# Patient Record
Sex: Male | Born: 1935 | Race: White | Hispanic: No | Marital: Married | Smoking: Never smoker
Health system: Southern US, Community
[De-identification: ages and names within clinical notes are randomized; demographics above are authoritative.]

## PROBLEM LIST (undated history)

## (undated) DIAGNOSIS — Z9289 Personal history of other medical treatment: Secondary | ICD-10-CM

## (undated) DIAGNOSIS — I10 Essential (primary) hypertension: Secondary | ICD-10-CM

## (undated) DIAGNOSIS — C801 Malignant (primary) neoplasm, unspecified: Secondary | ICD-10-CM

## (undated) DIAGNOSIS — E785 Hyperlipidemia, unspecified: Secondary | ICD-10-CM

## (undated) DIAGNOSIS — J439 Emphysema, unspecified: Secondary | ICD-10-CM

## (undated) DIAGNOSIS — N400 Enlarged prostate without lower urinary tract symptoms: Secondary | ICD-10-CM

## (undated) DIAGNOSIS — K760 Fatty (change of) liver, not elsewhere classified: Secondary | ICD-10-CM

## (undated) DIAGNOSIS — I6529 Occlusion and stenosis of unspecified carotid artery: Secondary | ICD-10-CM

## (undated) DIAGNOSIS — U071 COVID-19: Secondary | ICD-10-CM

## (undated) DIAGNOSIS — I739 Peripheral vascular disease, unspecified: Secondary | ICD-10-CM

## (undated) DIAGNOSIS — H9193 Unspecified hearing loss, bilateral: Secondary | ICD-10-CM

## (undated) DIAGNOSIS — K5900 Constipation, unspecified: Secondary | ICD-10-CM

## (undated) DIAGNOSIS — E041 Nontoxic single thyroid nodule: Secondary | ICD-10-CM

## (undated) DIAGNOSIS — I82409 Acute embolism and thrombosis of unspecified deep veins of unspecified lower extremity: Secondary | ICD-10-CM

## (undated) DIAGNOSIS — I2510 Atherosclerotic heart disease of native coronary artery without angina pectoris: Secondary | ICD-10-CM

## (undated) DIAGNOSIS — I700 Atherosclerosis of aorta: Secondary | ICD-10-CM

## (undated) DIAGNOSIS — G4700 Insomnia, unspecified: Secondary | ICD-10-CM

## (undated) DIAGNOSIS — K219 Gastro-esophageal reflux disease without esophagitis: Secondary | ICD-10-CM

## (undated) HISTORY — DX: Atherosclerotic heart disease of native coronary artery without angina pectoris: I25.10

## (undated) HISTORY — DX: Fatty (change of) liver, not elsewhere classified: K76.0

## (undated) HISTORY — DX: Essential (primary) hypertension: I10

## (undated) HISTORY — DX: Nontoxic single thyroid nodule: E04.1

## (undated) HISTORY — DX: Atherosclerosis of aorta: I70.0

## (undated) HISTORY — DX: Unspecified hearing loss, bilateral: H91.93

## (undated) HISTORY — DX: Insomnia, unspecified: G47.00

## (undated) HISTORY — DX: Peripheral vascular disease, unspecified: I73.9

## (undated) HISTORY — PX: APPENDECTOMY: SHX54

## (undated) HISTORY — PX: LIPOMA EXCISION: SHX5283

## (undated) HISTORY — DX: Personal history of other medical treatment: Z92.89

## (undated) HISTORY — DX: Constipation, unspecified: K59.00

## (undated) HISTORY — DX: Malignant (primary) neoplasm, unspecified: C80.1

## (undated) HISTORY — DX: Occlusion and stenosis of unspecified carotid artery: I65.29

## (undated) HISTORY — DX: Gastro-esophageal reflux disease without esophagitis: K21.9

## (undated) HISTORY — DX: Emphysema, unspecified: J43.9

## (undated) HISTORY — DX: Benign prostatic hyperplasia without lower urinary tract symptoms: N40.0

## (undated) HISTORY — DX: Hyperlipidemia, unspecified: E78.5

## (undated) HISTORY — PX: HERNIA REPAIR: SHX51

---

## 1998-05-25 ENCOUNTER — Encounter: Payer: Self-pay | Admitting: *Deleted

## 1998-05-25 ENCOUNTER — Ambulatory Visit (HOSPITAL_COMMUNITY): Admission: RE | Admit: 1998-05-25 | Discharge: 1998-05-25 | Payer: Self-pay | Admitting: *Deleted

## 1999-01-16 ENCOUNTER — Encounter: Payer: Self-pay | Admitting: *Deleted

## 1999-01-16 ENCOUNTER — Inpatient Hospital Stay (HOSPITAL_COMMUNITY): Admission: EM | Admit: 1999-01-16 | Discharge: 1999-01-16 | Payer: Self-pay | Admitting: *Deleted

## 2001-05-15 ENCOUNTER — Ambulatory Visit (HOSPITAL_COMMUNITY): Admission: RE | Admit: 2001-05-15 | Discharge: 2001-05-15 | Payer: Self-pay | Admitting: Gastroenterology

## 2003-03-23 ENCOUNTER — Ambulatory Visit (HOSPITAL_COMMUNITY): Admission: RE | Admit: 2003-03-23 | Discharge: 2003-03-23 | Payer: Self-pay | Admitting: Family Medicine

## 2005-08-02 ENCOUNTER — Inpatient Hospital Stay (HOSPITAL_COMMUNITY): Admission: AD | Admit: 2005-08-02 | Discharge: 2005-08-04 | Payer: Self-pay | Admitting: Internal Medicine

## 2007-01-29 ENCOUNTER — Ambulatory Visit: Payer: Self-pay | Admitting: Vascular Surgery

## 2007-09-09 ENCOUNTER — Ambulatory Visit: Payer: Self-pay | Admitting: Vascular Surgery

## 2008-10-20 ENCOUNTER — Ambulatory Visit: Payer: Self-pay | Admitting: Vascular Surgery

## 2009-01-26 ENCOUNTER — Ambulatory Visit: Payer: Self-pay | Admitting: Vascular Surgery

## 2009-02-15 ENCOUNTER — Emergency Department (HOSPITAL_BASED_OUTPATIENT_CLINIC_OR_DEPARTMENT_OTHER): Admission: EM | Admit: 2009-02-15 | Discharge: 2009-02-15 | Payer: Self-pay | Admitting: Emergency Medicine

## 2009-03-05 ENCOUNTER — Inpatient Hospital Stay (HOSPITAL_COMMUNITY): Admission: RE | Admit: 2009-03-05 | Discharge: 2009-03-06 | Payer: Self-pay | Admitting: Vascular Surgery

## 2009-03-05 ENCOUNTER — Ambulatory Visit: Payer: Self-pay | Admitting: Vascular Surgery

## 2009-03-05 ENCOUNTER — Encounter: Payer: Self-pay | Admitting: Vascular Surgery

## 2009-03-05 HISTORY — PX: CAROTID ENDARTERECTOMY: SUR193

## 2009-03-09 ENCOUNTER — Ambulatory Visit: Payer: Self-pay | Admitting: Vascular Surgery

## 2009-03-23 ENCOUNTER — Ambulatory Visit: Payer: Self-pay | Admitting: Vascular Surgery

## 2009-04-13 ENCOUNTER — Ambulatory Visit: Payer: Self-pay | Admitting: Vascular Surgery

## 2009-08-24 ENCOUNTER — Ambulatory Visit: Payer: Self-pay | Admitting: Vascular Surgery

## 2010-03-08 ENCOUNTER — Ambulatory Visit: Admit: 2010-03-08 | Payer: Self-pay | Admitting: Vascular Surgery

## 2010-03-08 ENCOUNTER — Ambulatory Visit
Admission: RE | Admit: 2010-03-08 | Discharge: 2010-03-08 | Payer: Self-pay | Source: Home / Self Care | Attending: Vascular Surgery | Admitting: Vascular Surgery

## 2010-03-09 NOTE — Assessment & Plan Note (Signed)
OFFICE VISIT  HAMILTON, MARINELLO E DOB:  Aug 15, 1929                                       03/08/2010 [id number]#:[id number]  Mr. Inge returns today for continued follow-up regarding his carotid occlusive disease.  He underwent a right carotid endarterectomy by me in January 2011 for an asymptomatic severe carotid stenosis.  He has done very well since that time with no neurologic symptoms other than some of balance problems and dizziness which he was experiencing after last visit.  He was evaluated by ENT and found to have an inner ear problem which has now resolved and he is asymptomatic.  He does have some limping occasionally on his right leg due to some discomfort in the lateral aspect the right knee.  He denies any chest pain, dyspnea on exertion, PND or orthopnea.  SOCIAL HISTORY:  He is married and has 1 child, is retired, does not use tobacco or alcohol.  PHYSICAL EXAMINATION:  Blood pressure is 172/96, heart rate 72, respirations 14. GENERAL:  He is a well-developed, well-nourished male in no apparent distress, alert and oriented x3. HEENT:  Exam is normal for age.  EOMs intact. LUNGS:  Clear to auscultation.  No rhonchi or wheezing. CARDIOVASCULAR:  Regular rhythm.  No murmurs.  Carotid pulses 3+.  No audible bruits. ABDOMEN:  Soft, nontender with no masses. NEUROLOGIC:  Exam is normal.  Today I ordered a carotid duplex exam which I have reviewed and interpreted.  Both internal carotid arteries are widely patent with no evidence of restenosis on the right and only mild intimal thickening on the left.  I have reassured him regarding these findings and will see him back in 1 year for a follow-up carotid duplex exam unless he develops any neurologic symptoms in the interim.    [name] Hart Rochester, M.D. Electronically Signed  JDL/MEDQ  D:  03/08/2010  T:  03/09/2010  Job:  [id number]

## 2010-03-14 NOTE — Procedures (Unsigned)
CAROTID DUPLEX EXAM  INDICATION:  Followup known carotid artery disease.  HISTORY: Diabetes:  No. Cardiac:  No. Hypertension:  No. Smoking:  No. Previous Surgery:  Right carotid endarterectomy 03/05/2009. CV History:  The patient is currently asymptomatic. Amaurosis Fugax No, Paresthesias No, Hemiparesis No                                      RIGHT             LEFT Brachial systolic pressure:         122               126 Brachial Doppler waveforms:         WNL               WNL Vertebral direction of flow:        Antegrade         Antegrade DUPLEX VELOCITIES (cm/sec) CCA peak systolic                   67                79 ECA peak systolic                   80                87 ICA peak systolic                   84                75 ICA end diastolic                   29                15 PLAQUE MORPHOLOGY:                  Heterogeneous     Heterogeneous PLAQUE AMOUNT:                      Minimal           Mild to moderate PLAQUE LOCATION:                    ECA               ICA, ECA, CCA  IMPRESSION:  Bilaterally no hemodynamically significant stenosis. Patent right carotid endarterectomy.  Intimal thickening within the left common carotid artery.  Study stable compared to previous.         ___________________________________________ [name] Hart [place], M.D.  OD/MEDQ  D:  03/08/2010  T:  03/08/2010  Job:  [id number]

## 2010-04-24 LAB — BASIC METABOLIC PANEL
BUN: 12 mg/dL (ref 6–23)
CO2: 28 mEq/L (ref 19–32)
CO2: 25 mEq/L (ref 19–32)
Calcium: 9.7 mg/dL (ref 8.4–10.5)
Chloride: 103 mEq/L (ref 96–112)
GFR calc non Af Amer: 59 mL/min — ABNORMAL LOW (ref >60)
GFR calc non Af Amer: >60 mL/min (ref >60)
Glucose, Bld: 129 mg/dL — ABNORMAL HIGH (ref 70–99)
Potassium: 4.3 mEq/L (ref 3.5–5.1)
Sodium: 135 mEq/L (ref 135–145)

## 2010-04-24 LAB — COMPREHENSIVE METABOLIC PANEL
Albumin: 4 g/dL (ref 3.5–5.2)
Alkaline Phosphatase: 63 U/L (ref 39–117)
GFR calc Af Amer: >60 mL/min (ref >60)
GFR calc non Af Amer: >60 mL/min (ref >60)
Glucose, Bld: 108 mg/dL — ABNORMAL HIGH (ref 70–99)
Potassium: 4.1 mEq/L (ref 3.5–5.1)
Sodium: 142 mEq/L (ref 135–145)
Total Protein: 6.3 g/dL (ref 6.0–8.3)

## 2010-04-24 LAB — CROSSMATCH
ABO/RH(D): O POS
Antibody Screen: NEGATIVE

## 2010-04-24 LAB — CBC
HCT: 50.8 % (ref 39.0–52.0)
HCT: 40.3 % (ref 39.0–52.0)
Hemoglobin: 17.5 g/dL — ABNORMAL HIGH (ref 13.0–17.0)
Hemoglobin: 15.3 g/dL (ref 13.0–17.0)
MCV: 98.8 fL (ref 78.0–100.0)
MCV: 98.9 fL (ref 78.0–100.0)
Platelets: 145 10*3/uL — ABNORMAL LOW (ref 150–400)
RBC: 4.48 MIL/uL (ref 4.22–5.81)
RDW: 12.1 % (ref 11.5–15.5)
RDW: 13 % (ref 11.5–15.5)
WBC: 6.1 10*3/uL (ref 4.0–10.5)
WBC: 9.4 10*3/uL (ref 4.0–10.5)

## 2010-04-24 LAB — ABO/RH: ABO/RH(D): O POS

## 2010-04-24 LAB — DIFFERENTIAL
Lymphs Abs: 0.3 10*3/uL — ABNORMAL LOW (ref 0.7–4.0)
Monocytes Relative: 3 % (ref 3–12)
Neutrophils Relative %: 93 % — ABNORMAL HIGH (ref 43–77)

## 2010-04-24 LAB — URINALYSIS, ROUTINE W REFLEX MICROSCOPIC
Glucose, UA: NEGATIVE mg/dL
Ketones, ur: NEGATIVE mg/dL
Nitrite: NEGATIVE
Protein, ur: NEGATIVE mg/dL
Urobilinogen, UA: 1 mg/dL (ref 0.0–1.0)

## 2010-04-24 LAB — PROTIME-INR: INR: 1 (ref 0.00–1.49)

## 2010-06-21 NOTE — Assessment & Plan Note (Signed)
OFFICE VISIT   William Mann, William Mann  DOB:  11/04/1929                                       03/09/2009  JJOAC#:[id number]   The patient underwent a right carotid endarterectomy for severe right  internal carotid stenosis which was asymptomatic on January 28.  He was  discharged the following day with some mild swelling in the right neck  but no specific complaints.  Since then he was concerned about swelling  in the midportion of his neck and a firm area at the base of the neck as  well as some swelling down over the upper chest area.  He has had no  hemiparesis, aphasia, amaurosis fugax, diplopia, blurred vision, syncope  and is swallowing well.  He is taking 1 aspirin per day.  He also has  had no significant fever with 1 temperature around 100 degrees.   PHYSICAL EXAMINATION:  Today, blood pressure 140/94, heart rate 88,  temperature 98.7.  Right neck incision is healing nicely.  There is some  mild to moderate swelling which is unchanged from the morning of  discharge.  There is no pulsatile mass.  Carotid pulses are 3+ and no  bruits audible.  Neurologic:  Exam is normal except for mild right  marginal mandibular nerve paresis which he had postoperatively.  There  is some mild ecchymosis in the upper chest near the sternal notch area  where the blood has diffused into that area but no significant swelling  or hematoma.   I have reassured him regarding all of these findings.  He will increase  his activity and return to driving as he feels up to it  over the next  week or so and will return to see me in 6 months for follow-up carotid  duplex exam unless he has any concerns in the interim.     [name] Hart [place], M.D.  Electronically Signed   JDL/MEDQ  D:  03/09/2009  T:  03/10/2009  Job:  [id number]   cc:   Lyn Records, M.D.

## 2010-06-21 NOTE — Procedures (Signed)
CAROTID DUPLEX EXAM   INDICATION:  Followup of known carotid artery disease.  Patient is  asymptomatic.   HISTORY:  Diabetes:  No.  Cardiac:  No.  Hypertension:  No.  Smoking:  No.  Previous Surgery:  No.  CV History:  Amaurosis Fugax No, Paresthesias No, Hemiparesis No  Left eye loss of vision in 2002, thought to be a retinal vein  thrombosis, per Dr. Lovell Sheehan.                                       RIGHT             LEFT  Brachial systolic pressure:         115               118  Brachial Doppler waveforms:         WNL               WNL  Vertebral direction of flow:        Antegrade         Antegrade  DUPLEX VELOCITIES (cm/sec)  CCA peak systolic                   237 (distal)      102  ECA peak systolic                   318               101  ICA peak systolic                   229               123  ICA end diastolic                   40                22  PLAQUE MORPHOLOGY:                  Mixed             Mixed  PLAQUE AMOUNT:                      Moderate-severe   Moderate  PLAQUE LOCATION:                    Bifurcation, ICA, ECA               Bifurcation, ICA   IMPRESSION:  1. Right internal carotid artery is tortuous.  2. Right distal common carotid artery with known  50% stenosis      extending into PICA causing a 60-79% stenosis (at low end of      range).  3. Left 40-59% internal carotid artery stenosis.  4. Left external carotid artery stenosis.    ___________________________________________  [name] Hart [place], M.D.   PB/MEDQ  D:  01/29/2007  T:  01/30/2007  Job:  [id number]

## 2010-06-21 NOTE — Procedures (Signed)
CAROTID DUPLEX EXAM   INDICATION:  Followup carotid artery disease.   HISTORY:  Diabetes:  No.  Cardiac:  No.  Hypertension:  No.  Smoking:  No.  Previous Surgery:  No.  CV History:  Partial vision loss in left eye since 2002.  Amaurosis Fugax No, Paresthesias No, Hemiparesis No                                       RIGHT               LEFT  Brachial systolic pressure:         116                 120  Brachial Doppler waveforms:         WNL                 WNL  Vertebral direction of flow:        Antegrade           Antegrade  DUPLEX VELOCITIES (cm/sec)  CCA peak systolic                   M=82, D=196         101  ECA peak systolic                   303                 135  ICA peak systolic                   314                 102  ICA end diastolic                   84                  30  PLAQUE MORPHOLOGY:                  Mixed               Mixed  PLAQUE AMOUNT:                      Moderate/severe     Mild  PLAQUE LOCATION:                    ICA/ECA/bifurcation  ICA/ECA/bifurcation   IMPRESSION:  1. Right internal carotid artery shows evidence of 60-79% stenosis, an      increase in velocity from previous, however, no category change.  2. Right distal common carotid artery stenosis extending into the      proximal internal carotid artery and external carotid artery.  3. Right external carotid artery stenosis.  4. Left internal carotid artery shows evidence of 20-39% stenosis.  5. Bilateral tortuous internal carotid arteries.   ___________________________________________  [name] Hart [place], M.D.   AS/MEDQ  D:  10/20/2008  T:  10/21/2008  Job:  [id number]

## 2010-06-21 NOTE — Assessment & Plan Note (Signed)
OFFICE VISIT   LUCAH, PETTA E  DOB:  12-May-1929                                       03/23/2009  ZOXWR#:[id number]   Patient returns to clinic today for wound check.  He underwent right  carotid endarterectomy with Dacron patch angioplasty on 03/05/2009.  He  was seen by Dr. Hart Rochester on 03/09/2009.  He returns today for further  evaluation of his neck.  He states that he has had no hemiparesis,  aphasia, amaurosis fugax.  He is swallowing and breathing.  He returns  today for follow-up.   Physical findings revealed a well-nourished gentleman in no apparent  distress.  Heart rate was 86, blood pressure 154/93, O2 sat was 96%.  HEENT:  PERRLA.  EOMI.  Lungs were clear to auscultation.  Cardiac exam  revealed a regular rate and rhythm.  His right neck incision is healing  well.  There continues to remain some minimal swelling, but the wound is  otherwise in very good condition.   Patient's wife is with him today.  Both were reassured that he is  healing normally, and he can begin participation in other activities, as  his condition warrants.  He was instructed it would be okay for him to  exercise, to play golf, to drive.  He was given a return appointment in  6 months for follow-up.   Wilmon Arms, PA   [name] Hart Rochester, M.D.  Electronically Signed   KEL/MEDQ  D:  03/23/2009  T:  03/23/2009  Job:  [id number]

## 2010-06-21 NOTE — Assessment & Plan Note (Signed)
OFFICE VISIT   William Mann, William Mann  DOB:  January 08, 1930                                       04/13/2009  ZDGLO#:[id number]   Patient had a right carotid endarterectomy performed by me on January 28  for severe asymptomatic right internal carotid stenosis.  He has done  very well but was concerned today about some drainage from the  incision and some low grade fever.  He states that he has lost a few  pounds since the surgery, but he is swallowing well, has no hoarseness,  and denies any neurologic symptoms.   On physical exam today, blood pressure is 134/84, heart rate is 80,  respirations are 14, temperature 98.5.  Right neck incision has healed  nicely with no evidence of infection.  The Vicryl knot in the  subcuticular closure was exposed in the very apex of the wound, and it  was removed today.  I think this is where the drainage was arising.  There is no evidence of any wound infection or deep infection.  Neurologic exam is normal except for a mild right marginal and  mandibular nerve weakness.  Was reassured regarding this.   Return in 5 months as scheduled for a carotid duplex exam.     [name], M.D.  Electronically Signed   JDL/MEDQ  D:  04/13/2009  T:  04/13/2009  Job:  [id number]

## 2010-06-21 NOTE — Assessment & Plan Note (Signed)
OFFICE VISIT   KALLAN, MERRICK E  DOB:  06-30-1929                                       08/24/2009  ZOXWR#:[id number]   The patient is status post right carotid endarterectomy done by me in  January of this year for severe right internal carotid stenosis which  was asymptomatic.  He has done very well postoperatively but lately has  developed some occasional dizziness particularly when he is lying in the  bed with the left side of his head down.  This lasts only a few seconds  and has not been associated with syncope, hemiparesis, aphasia or  amaurosis fugax.  He has no history of stroke.  He has had some  occasional problems with balance when he walks which was present to some  degree preoperatively and possibly worsened slightly.  He has not  fallen.  He is taking one aspirin per day.  He denies claudication  symptoms.   SOCIAL HISTORY:  He is married, has one child, is retired.  Does not use  tobacco or alcohol.   Review of systems is negative for chest pain, dyspnea on exertion,  hemoptysis, bronchitis, wheezing.  No GI or GU problems.  All other  systems are negative.   PHYSICAL EXAM:  Vital signs:  Blood pressure 149/87, heart rate 91,  respirations 14.  General:  He is a well-developed, well-nourished male  in no apparent distress.  HEENT:  Exam is normal for age.  EOMs intact.  Lungs:  Clear to auscultation.  No rhonchi or wheezing.  Cardiovascular:  Regular rhythm.  Carotid pulses are 3+ with no bruits.  Abdomen:  Soft,  nontender with no masses.  He has 3+ femoral and posterior tibial pulses  bilaterally.  Neurological:  Normal.   Today I ordered a carotid duplex exam which I reviewed and interpreted.  Both internal carotids are widely patent with no evidence of significant  stenosis.   I reassured him regarding these findings.  We will see him in 6 months  with a carotid duplex exam and if that looks good we will follow him on  an annual  basis.     [name] Hart Rochester, M.D.  Electronically Signed   JDL/MEDQ  D:  08/24/2009  T:  08/25/2009  Job:  [id number]

## 2010-06-21 NOTE — Procedures (Signed)
CAROTID DUPLEX EXAM   INDICATION:  Follow up known carotid disease.   HISTORY:  Diabetes:  No.  Cardiac:  No.  Hypertension:  No.  Smoking:  No.  Previous Surgery:  Right carotid endarterectomy on 03/05/09.  CV History:  Partial vision loss in left eye since 2002, recent  lightheadedness and dizziness and off balance when walking.  Amaurosis Fugax No, Paresthesias No, Hemiparesis No.                                       RIGHT             LEFT  Brachial systolic pressure:         110               118  Brachial Doppler waveforms:         Normal            Normal  Vertebral direction of flow:        Antegrade         Antegrade  DUPLEX VELOCITIES (cm/sec)  CCA peak systolic                   72                92  ECA peak systolic                   107               104  ICA peak systolic                   71                87  ICA end diastolic                   23                25  PLAQUE MORPHOLOGY:                                    Calcific  PLAQUE AMOUNT:                      None              Mild  PLAQUE LOCATION:                                      ICA, ECA   IMPRESSION:  1. Patent right carotid, post carotid endarterectomy with no evidence      of restenosis.  2. Left internal carotid artery Doppler velocities suggest 1% to 39%      stenosis.  3. Antegrade flow in bilateral vertebral arteries.   ___________________________________________  [name] Hart Rochester, M.D.   NT/MEDQ  D:  08/24/2009  T:  08/24/2009  Job:  [id number]

## 2010-06-21 NOTE — H&P (Signed)
HISTORY AND PHYSICAL EXAMINATION   January 26, 2009   Re:  CHRISTIE, COPLEY E              DOB:  02/20/1929   CHIEF COMPLAINT:  Severe right internal carotid stenosis - asymptomatic.   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  He has had progressive stenosis of  his right internal carotid artery which is now approximating 80%.  He  has had a second opinion at [facility] by Dr. Noel Christmas and he is  now returning for an elective right carotid endarterectomy for this  asymptomatic severe lesion.   PAST MEDICAL HISTORY:  Is negative for diabetes, coronary artery  disease, COPD or stroke.  He does have hypertension.   PAST SURGICAL HISTORY:  1. Appendectomy.  2. Hernia repair.  3. Removal of lipoma from the leg.   FAMILY HISTORY:  Positive for coronary artery disease in sister and two  brothers, stroke in a brother.  Negative for diabetes.   SOCIAL HISTORY:  The patient does not use tobacco or alcohol.  He is a  Civil engineer, contracting 2 days a week.   REVIEW OF SYSTEMS:  Negative for chest pain, dyspnea on exertion, PND,  orthopnea.  No pulmonary symptoms.  Denies any weight loss, anorexia.  No urinary or vascular symptoms.  No claudication.  All other symptoms  are negative in review of systems.   PHYSICAL EXAMINATION:  Blood pressure 166/105, heart rate 74,  respirations 14, temperature 97.4.  Generally he is a well-developed,  well-nourished male in no apparent distress, alert and oriented x3.  Neck is supple, 3+ carotid pulses palpable.  There is harsh bruit on the  right side over the carotid bifurcation.  Neurologic exam is normal.  No  skin rashes are noted.  HEENT exam is normal.  Chest clear to  auscultation.  Cardiovascular exam is a regular  rhythm.  No murmurs.  Abdomen soft, nontender with no palpable masses.  He has 3+ femoral and  posterior tibial pulses bilaterally.  Neurologic exam is normal.  Musculoskeletal exam reveals no deformities.   I reviewed his carotid duplex exam today once again and also the  consultation note sent by Dr. Stevphen Rochester at [facility] and organized  those notes as well.   IMPRESSION:  Severe right internal carotid stenosis - asymptomatic.  Plan is to admit the patient on September 13 for an elective right  carotid endarterectomy.  The risks and benefits have been thoroughly  discussed with the patient and he would like to proceed.     [name] Hart Rochester, M.D.  Electronically Signed   JDL/MEDQ  D:  01/26/2009  T:  01/27/2009  Job:  [id number]

## 2010-06-21 NOTE — Assessment & Plan Note (Signed)
OFFICE VISIT   William Mann, [name]  DOB:  1929/08/05                                       10/20/2008  [id number]#:[id number]   The patient returns today for continued followup of his carotid  occlusive disease which I have been following since 2002.  He had a  moderate right internal carotid stenosis and he has had previous loss of  vision in his left eye which did not return.  He denies any active  hemispheric TIAs or amaurosis fugax in the right eye and has had no  syncope, blurred vision or dizziness.  He also denies chest pain,  dyspnea on exertion, PND, orthopnea or claudication although he does not  ambulate long distances.  He is taking one aspirin 81 mg tablet a day.   PHYSICAL EXAM:  Blood pressure 126/74, heart rate 59, respirations 14.  Carotid pulses are 3+ with harsh bruit on the right.  No bruit on the  left.  Neurological:  Normal with the exception of poor vision in the  left eye.  No palpable adenopathy in the neck.  Chest:  Clear to  auscultation.  Abdomen:  Soft, nontender with no masses.  He has 3+  femoral and popliteal pulses bilaterally with well-perfused lower  extremities.   Carotid duplex exam today has progressed from the last study and is more  severe than has shown in the last 8 years approximating 80% in the right  internal carotid with mild flow reduction on the left side.   He is asymptomatic and in good shape with no history of cardiac disease  and I have recommended proceeding with right carotid endarterectomy for  this 80% lesion.  He will consider this and discuss it with his wife and  if he would like to proceed he will be back in touch with [place] and  otherwise he will return in 6 months with a followup carotid duplex  exam.   [name], M.D.  Electronically Signed   JDL/MEDQ  D:  10/20/2008  T:  10/21/2008  Job:  [id number]   cc:   [name], M.D.

## 2010-06-21 NOTE — Assessment & Plan Note (Signed)
OFFICE VISIT   RAYMOUND, KATICH E  DOB:  08-06-1929                                       09/09/2007  [id number]#:[id number]   The patient called for an appointment today because he has had some  change in his neurologic symptoms.  I have followed him for many years  for carotid disease and he has had a known moderate right internal  carotid stenosis and mild left internal carotid stenosis, last checked  in December of 2008 in our office.  He now states that, over the last 3  to 4 weeks he has had some balance problems which cause him to be  unsteady while ambulating.  He has also noticed decreased hearing in his  left ear as well decreased memory.  He has awakened on several morning  with some numbness in both upper extremities extending down into both  hands, usually this lasts 4 to 5 minutes and then resolves.  He has had  no hemiparesis, aphasia, or amaurosis fugax.  He continues to take 1  aspirin per day and denies any cardiac symptoms such as chest pain,  dyspnea on exertion, PND or orthopnea.   PHYSICAL EXAMINATION:  Blood pressure 134/80, heart rate 70,  respirations 14.  Carotid pulses are 3+ with soft bruits bilaterally.  Neurologic:  Unremarkable.  No palpable adenopathy in the neck.  Chest:  Clear to auscultation.  Cardiovascular:  Regular rhythm with no murmurs.  Abdomen:  Soft, nontender with no masses.  Has 3+ femoral and dorsalis  pedis pulses palpable bilaterally.   Carotid duplex exam today was performed in our office and is essentially  unchanged from December of 2008 with a 60-70% right internal carotid  stenosis and a mild left internal carotid stenosis.   I do not think his carotid disease is responsible for these neurologic  symptoms and if, in fact, they are new symptoms, he may need a neurology  evaluation.  He will be seeing his medical doctor, Dr. Azucena Cecil, in the  very near future and will discuss this with him.  Will we will see  him  back in 1 year for followup carotid duplex exam unless he develops any  specific symptoms in the interim.   [name] Hart Rochester, M.D.  Electronically Signed   JDL/MEDQ  D:  09/09/2007  T:  09/10/2007  Job:  [id number]   cc:   [name], M.D.

## 2010-06-21 NOTE — Procedures (Signed)
CAROTID DUPLEX EXAM   INDICATION:  Follow-up of known carotid artery disease.  Patient has  been experiencing an unsteady gait for past 10 days.   HISTORY:  Diabetes:  No.  Cardiac:  Hypertension:  No.  Smoking:  No.  Previous Surgery:  CV History:  Amaurosis Fugax No, Paresthesias No, Hemiparesis No  Left eye loss of vision in 2002, thought to be retinal vein thrombosis  per Dr. Lovell Sheehan.                                       RIGHT             LEFT  Brachial systolic pressure:         117               117  Brachial Doppler waveforms:         Triphasic         Triphasic  Vertebral direction of flow:        Antegrade         Antegrade  DUPLEX VELOCITIES (cm/sec)  CCA peak systolic                   237 (D)           85  ECA peak systolic                   313               97  ICA peak systolic                   248               100  ICA end diastolic                   75                23  PLAQUE MORPHOLOGY:                  Mixed             Mixed  PLAQUE AMOUNT:                      Moderate-severe   Mild-moderate  PLAQUE LOCATION:                    Bifurcation, ICA, ECA               ICA   IMPRESSION:  1. Tortuous right ICA.  2. Right DCCA with a known  50% stenosis extending into the PICA,      causing a 60-79% ICA stenosis.  3. Left 20-39% ICA stenosis.  4. Left ECA stenosis.  Study essentially unchanged from that on      01/29/2007.     ___________________________________________  [name], M.D.   PB/MEDQ  D:  09/09/2007  T:  09/10/2007  Job:  [id number]

## 2010-06-24 NOTE — H&P (Signed)
NAMEKEILEN, KAHL              ACCOUNT NO.:  [id number]   MEDICAL RECORD NO.:  [id number]          PATIENT TYPE:  INP   LOCATION:  4705                         FACILITY:  [facility]   PHYSICIAN:  Deirdre Peer. Polite, M.D. DATE OF BIRTH:  1929-12-29   DATE OF ADMISSION:  08/02/2005  DATE OF DISCHARGE:                                HISTORY & PHYSICAL   CHIEF COMPLAINT:  Nausea, vomiting and diarrhea and off balance gait.   HISTORY OF PRESENT ILLNESS:  Mr. 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 Cecilie Kicks in  the past, who was sent to the hospital as a direct admission after being  seen in the primary M.D.'s office.  The patient has been in his usual state  of health until today when he noticed several bouts of nausea and vomiting,  approximately 6, as well as diarrhea, about 3.  The patient denies any blood  in either.  He denies any chills.  He does admit to a low grade temperature,  according to his wife, or 99.  The patient denies any recent antibiotics.  He recently did travel to the beach with his family.  He denies any unusual  food intake.  He did eat a chicken sandwich, which his family had, and did  eat seafood at a facility which his family had, as well.  The patient's  symptoms of nausea, vomiting and diarrhea have progressed to the point that  he is feeling weak and lightheaded.  Therefore, the patient was seen in his  primary M.D.'s office.  There, the patient was evaluated, found to be  dehydration, had positive orthostatic, and was sent to the hospital for  further evaluation.  Also of note, over the last 4-5 days, the patient has  complained of trouble with his gait and not being able to place his feet  where he wanted them.  He denies any dysarthria, and denies any limb  weakness.  Again, as stated, the patient does have a history of  right  carotid artery stenosis.  Admission is deemed necessary for further  evaluation and treatment of dehydration and ataxic gait.   PAST MEDICAL HISTORY:  1.  As stated above, significant for high cholesterol.  2.  The patient admits to left retinal vein occlusion in 2002.  3.  History of scoliosis.  4.  GERD.   MEDICATIONS ON ADMISSION:  1.  Crestor 10 mg daily.  2.  Aspirin 81 mg daily.  3.  Centrum Silver.   SOCIAL HISTORY:  Negative for tobacco.  No alcohol, no drugs.   PAST SURGICAL HISTORY:  1.  Appendectomy in the past.  2.  Hernia repair in the past.   ALLERGIES:  No known drug allergies.   FAMILY HISTORY:  Mother deceased secondary to old age.  Father with history  of CVA.  The patient had brothers and sisters, both with coronary disease,  hypertension, as well as CVA.  The patient had a sister with breast  CA.   REVIEW OF SYSTEMS:  As stated in the HPI.   PHYSICAL EXAMINATION:  GENERAL:  The patient is alert and oriented x3, in no  apparent distress.  VITAL SIGNS:  Vitals in the office revealed a blood pressure of 128/78  lying, 104/70 sitting, 100/70 standing.  Temperature is pending at the time  of this dictation.  HEENT:  Pupils equal, round and reactive to light.  Anicteric sclerae.  No  oral lesions.  No nodes.  The patient does have dry oral mucosa.  The  patient does have a right carotid bruit.  CHEST:  Clear to auscultation bilaterally.  CARDIOVASCULAR:  Regular.  No S3.  ABDOMEN:  Soft, nontender.  No distention.  No guarding.  EXTREMITIES:  No clubbing, cyanosis, or edema.  There were 2+ pulses.  NEUROLOGIC:  Cranial nerves II-XII intact.  Motor 5/5.  Deep tendon reflexes  symmetrical.  The patient's gait is somewhat ataxic.  Visual fields are  intact.   DATA:  Pending at the time of this dictation.   ASSESSMENT:  1.  Nausea, vomiting and diarrhea.  Rule out viral gastroenteritis.  Please      note the patient has had a low-grade fever.  Also a  recent visit to the      beach eating seafood.  2.  Ataxic gait.  Rule out cerebrovascular accident.  The patient with a      known history of right carotid stenosis.  3.  Dyslipidemia.  Recommend the patient be admitted to a telemetry floor      bed for further evaluation and treatment.  4.  As for the patient's nausea, vomiting and diarrhea, the patient will be      hydrated.  He will have laboratories ordered, and will rule out      infectious etiology.  5.  As for the patient's ataxic gait, will start with a CT, carotid      ultrasound, MRI and MRA of the brain to rule out posterior circulation      _________.  6.  Will make further recommendations after review of the above studies.      Deirdre Peer. Polite, M.D.  Electronically Signed     RDP/MEDQ  D:  08/02/2005  T:  08/02/2005  Job:  [id number]   cc:   [name], M.D.  Fax: [phone]

## 2010-06-24 NOTE — Discharge Summary (Signed)
William Mann, William Mann              ACCOUNT NO.:  [id number]   MEDICAL RECORD NO.:  [id number]          PATIENT TYPE:  INP   LOCATION:  4705                         FACILITY:  [facility]   PHYSICIAN:  Deirdre Peer. Polite, M.D. DATE OF BIRTH:  17-Oct-1929   DATE OF ADMISSION:  08/02/2005  DATE OF DISCHARGE:  08/04/2005                                 DISCHARGE SUMMARY   DISCHARGE DIAGNOSES:  1.  Viral gastroenteritis, resolved, characterized by nausea, vomiting and      diarrhea.  Please note, the patient is afebrile at discharge without      leukocytosis, negative orthostatics.  2.  Ataxic gait, resolved, probably secondary to #1 plus/minus vertiginous      symptoms.  Please note, the patient has had a CT and MRI of his head      without acute abnormality.  3.  Dyslipidemia, on treatment.  Cholesterol in hospital total 173, LDL 116,      HDL 51.  4.  History of left retinal vein occlusion in 2002.  5.  Known right carotid artery stenosis, approximately 60%, followed by Dr.      Hart [place] of [facility].   DISCHARGE MEDICATIONS:  The patient to resume home meds, which include  Crestor, aspirin, Centrum Silver.   DISPOSITION:  Being discharged to home in stable condition.  Asked to follow  up with primary MD.   CONSULTANTS:  None.   STUDIES:  The patient had an amylase, lipase within normal limits.  BMT, CBC  within normal limits.  Cholesterol panel is stated above.  CT scan of the  head:  Cerebral atrophy, but no acute intracranial abnormality.  Mild sinus  disease.  MRI/MRA of the brain:  No acute stroke.  Small region of blood  breakdown product in the left occipital and left frontal lobe.  Please note,  CT was reviewed with the neuro radiologist and further comments that this  represents probable cavernous angioma, and no further evaluation is  warranted.   HISTORY OF PRESENT ILLNESS:  This 76 year old male, above medical problems,  sent to the hospital for evaluation of symptoms  consistent with viral  gastroenteritis and off-balance gait.  Please see dictated H&P for further  details.   PAST MEDICAL HISTORY:  As stated above.   MEDICATIONS ON ADMISSION:  As stated above.   SOCIAL HISTORY:  Negative for tobacco, alcohol or drugs.   HOSPITAL COURSE:  1.  The patient was admitted to the hospital floor bed for evaluation and      treatment of viral gastroenteritis symptoms.  The patient had labs      ordered, BMT, CBC, amylase, lipase within normal limits.  The patient      was treated conservatively with IV fluids, antiemetics.  The patient had      rapid resolution of his symptoms.  In fact, did not have any episodes of      emesis or diarrhea during this hospitalization and, as stated, without      leukocytosis; therefore, it is less likely this is a bacterial etiology,      probable viral  gastroenteritis.  2.  Reported history of ataxic gait:  As the patient has a history of known      carotid artery stenosis, 60%, and history of retinal vein occlusion, it      was felt prudent to rule out posterior circulation CVA for the above      symptoms versus other etiology, i.e., hypotension related to the      dehydration.  The patient had a CT and MRI of the brain without any      acute abnormalities.  The patient's gait was within normal limits during      this hospitalization, and is felt to be back to his baseline.  3.  Probable anxiety disorder/component of depression:  During this      hospitalization, the patient had multiple concerns and complaints about      somatic symptoms.  It is recommended that the patient have further      outpatient followup with his primary MD to further discuss probable      medication management of this problem.  I have discussed this with him      and his wife, and they are somewhat inclined to agree that further      management of the issue of anxiety may be warranted.   At this time, the patient is medically stable for  discharge to home.      Deirdre Peer. Polite, M.D.  Electronically Signed     RDP/MEDQ  D:  08/04/2005  T:  08/04/2005  Job:  [id number]

## 2010-06-24 NOTE — Procedures (Signed)
[facility]  Patient:    William Mann, William Mann Visit Number: [id number] MRN: [id number]          Service Type: END Location: ENDO Attending Physician:  Dennison Bulla Ii Dictated by:   Verlin Grills, M.D. Proc. Date: 05/15/01 Admit Date:  05/15/2001   CC:         Earl Lites D. Marina Goodell, M.D., [facility].   Procedure Report  PROCEDURE:  Screening colonoscopy.  REFERRING PHYSICIAN:  Tama Headings. Marina Goodell, M.D.  INDICATION FOR PROCEDURE:  [name] is a 77 year old male born 01/23/1930. [name] is referred for screening colonoscopy with polypectomy to prevent colon cancer. On March 03, 1996, [name] underwent a screening colonoscopy which was completely normal.  ENDOSCOPIST:  Verlin Grills, M.D.  PREMEDICATION:  Demerol 50 mg, Versed 5 mg.  ENDOSCOPE:  Olympus pediatric colonoscope.  DESCRIPTION OF PROCEDURE:  After obtaining informed consent, the patient was placed in the left lateral decubitus position. I administered intravenous Demerol and intravenous Versed to achieve conscious sedation for the procedure. The patients cardiac rhythm, oxygen saturation and blood pressure were monitored throughout the procedure and documented in the medical record.  Anal inspection was normal. Digital rectal exam revealed a non-nodular prostate. The Olympus pediatric video colonoscope was introduced into the rectum and easily advanced to the cecum. A normal appearing ileocecal valve was intubated and the distal ileum inspected.  Colonic preparation for the exam today was excellent.  RECTUM:  Normal.  SIGMOID COLON/DESCENDING COLON:  Normal.  SPLENIC FLEXURE:  Normal.  TRANSVERSE COLON:  Normal.  HEPATIC FLEXURE:  Normal.  ASCENDING COLON:  Normal.  CECUM/ILEOCECAL VALVE:  Normal.  DISTAL ILEUM:  Normal.  ASSESSMENT:  Normal proctocolonoscopy to the cecum with intubation of a normal appearing ileocecal  valve and distal ileum inspection.  RECOMMENDATIONS:  Repeat screening colonoscopy in 10 years. Dictated by:   Verlin Grills, M.D. Attending Physician:  Dennison Bulla Ii DD:  05/15/01 TD:  05/16/01 Job: [phone]

## 2010-08-23 ENCOUNTER — Other Ambulatory Visit: Payer: Self-pay | Admitting: Dermatology

## 2010-11-30 ENCOUNTER — Other Ambulatory Visit: Payer: Self-pay | Admitting: Dermatology

## 2011-03-08 ENCOUNTER — Other Ambulatory Visit: Payer: Self-pay

## 2011-03-10 ENCOUNTER — Encounter: Payer: Self-pay | Admitting: Vascular Surgery

## 2011-03-13 ENCOUNTER — Encounter: Payer: Self-pay | Admitting: Vascular Surgery

## 2011-03-14 ENCOUNTER — Other Ambulatory Visit: Payer: Self-pay

## 2011-03-14 ENCOUNTER — Other Ambulatory Visit (INDEPENDENT_AMBULATORY_CARE_PROVIDER_SITE_OTHER): Payer: Medicare Other | Admitting: *Deleted

## 2011-03-14 ENCOUNTER — Encounter: Payer: Self-pay | Admitting: Vascular Surgery

## 2011-03-14 ENCOUNTER — Ambulatory Visit (INDEPENDENT_AMBULATORY_CARE_PROVIDER_SITE_OTHER): Payer: Medicare Other | Admitting: Vascular Surgery

## 2011-03-14 ENCOUNTER — Ambulatory Visit: Payer: Self-pay | Admitting: Vascular Surgery

## 2011-03-14 VITALS — BP 129/84 | HR 72 | Resp 16 | Ht 69.0 in | Wt 158.0 lb

## 2011-03-14 DIAGNOSIS — I6529 Occlusion and stenosis of unspecified carotid artery: Secondary | ICD-10-CM | POA: Insufficient documentation

## 2011-03-14 DIAGNOSIS — Z48812 Encounter for surgical aftercare following surgery on the circulatory system: Secondary | ICD-10-CM

## 2011-03-14 DIAGNOSIS — Z01810 Encounter for preprocedural cardiovascular examination: Secondary | ICD-10-CM

## 2011-03-14 NOTE — Progress Notes (Signed)
Subjective:     Patient ID: William Mann, male   DOB: [birth year], 78 y.o.   MRN: [id number]  HPI this 78 year old male returns for continued followup regarding his right carotid endarterectomy done 2 years ago for an asymptomatic severe right internal carotid stenosis. He denies any active neurologic symptoms such as lateralizing weakness, aphasia, diplopia, blurred vision, syncope, or amaurosis fugax. He has no history of stroke. He is taking one aspirin per day.   Past Medical History  Diagnosis Date  . Hyperlipidemia   . Hypertension     History  Substance Use Topics  . Smoking status: Never Smoker   . Smokeless tobacco: Not on file  . Alcohol Use: No    Family History  Problem Relation Age of Onset  . Heart disease Sister   . Heart disease Brother   . Stroke Brother   . Heart disease Brother   . Hyperlipidemia Son     No Known Allergies  Current outpatient prescriptions:aspirin 81 MG tablet, Take 160 mg by mouth daily., Disp: , Rfl: ;  fish oil-omega-3 fatty acids 1000 MG capsule, Take 1 g by mouth daily., Disp: , Rfl: ;  Multiple Vitamin (MULTIVITAMIN) tablet, Take 1 tablet by mouth daily., Disp: , Rfl: ;  rosuvastatin (CRESTOR) 5 MG tablet, Take 5 mg by mouth daily., Disp: , Rfl: ;  zolpidem (AMBIEN) 5 MG tablet, Take 5 mg by mouth at bedtime as needed., Disp: , Rfl:  Coenzyme Q10 (COQ-10) 100 MG CAPS, Take by mouth daily., Disp: , Rfl:   BP 129/84  Pulse 72  Resp 16  Ht 5\' 9"  (1.753 m)  Wt 158 lb (71.668 kg)  BMI 23.33 kg/m2  SpO2 97%  Body mass index is 23.33 kg/(m^2).         Review of Systems denies chest pain, dyspnea on exertion, PND, orthopnea, hemoptysis, claudication. All other systems are negative and complete review of systems    Objective:   Physical Exam pressure 129/84 heart rate 72 respirations 16 General well-developed well-nourished male no apparent distress alert and oriented x3 HEENT normal for age Lungs no rhonchi or  wheezing Cardiovascular regular rhythm no murmurs Carotid pulses 3+ no audible bruits Abdomen soft nontender no palpable masses Lower extremities 3+ femoral 2+ dorsalis pedis pulses palpable bilaterally Neurologic normal Musculoskeletal 3 major deformities  Today I ordered a carotid duplex exam which I reviewed and interpreted. There is no evidence of internal carotid stenosis bilaterally.    Assessment:     Doing well post right carotid endarterectomy for severe asymptomatic stenosis 2 years ago    Plan:     Return in one year for carotid duplex exam continued followup  Continue 1 aspirin per day Will be in touch with [place] if he develops any new neurologic symptoms

## 2011-03-21 NOTE — Procedures (Unsigned)
CAROTID DUPLEX EXAM  INDICATION:  Followup known carotid artery disease.  HISTORY: Diabetes:  No Cardiac:  No Hypertension:  No Smoking:  No Previous Surgery:  Right carotid endarterectomy 03/05/2009 CV History:  Asymptomatic Amaurosis Fugax Yes No, Paresthesias Yes No, Hemiparesis Yes No                                      RIGHT             LEFT Brachial systolic pressure:         130               140 Brachial Doppler waveforms:         Triphasic         Triphasic Vertebral direction of flow:        Antegrade         Antegrade DUPLEX VELOCITIES (cm/sec) CCA peak systolic                   114               100 ECA peak systolic                   106               159 ICA peak systolic                   65                91 ICA end diastolic                   19                21 PLAQUE MORPHOLOGY:                  Homogeneous       Heterogeneous PLAQUE AMOUNT:                      Minimal           Mild to moderate PLAQUE LOCATION:                    ICA               ICA, ECA, CCA  IMPRESSION: 1. Right:  Patent carotid endarterectomy site with no evidence for     restenosis. 2. Left:  0%-39% internal carotid artery stenosis.   ___________________________________________ [name] Hart [place], M.D.  SS/MEDQ  D:  03/14/2011  T:  03/14/2011  Job:  [id number]

## 2011-04-11 ENCOUNTER — Other Ambulatory Visit: Payer: Self-pay

## 2011-04-11 ENCOUNTER — Ambulatory Visit: Payer: Self-pay | Admitting: Vascular Surgery

## 2011-04-18 ENCOUNTER — Other Ambulatory Visit: Payer: Self-pay | Admitting: Dermatology

## 2011-04-19 ENCOUNTER — Other Ambulatory Visit: Payer: Self-pay | Admitting: *Deleted

## 2011-04-19 DIAGNOSIS — I6529 Occlusion and stenosis of unspecified carotid artery: Secondary | ICD-10-CM

## 2011-07-13 ENCOUNTER — Other Ambulatory Visit: Payer: Self-pay | Admitting: Gastroenterology

## 2011-08-03 ENCOUNTER — Emergency Department (HOSPITAL_BASED_OUTPATIENT_CLINIC_OR_DEPARTMENT_OTHER)
Admission: EM | Admit: 2011-08-03 | Discharge: 2011-08-03 | Disposition: A | Discharge status: Home / Self Care | Payer: Medicare Other | Attending: Emergency Medicine | Admitting: Emergency Medicine

## 2011-08-03 ENCOUNTER — Encounter (HOSPITAL_BASED_OUTPATIENT_CLINIC_OR_DEPARTMENT_OTHER): Payer: Self-pay

## 2011-08-03 ENCOUNTER — Emergency Department (HOSPITAL_BASED_OUTPATIENT_CLINIC_OR_DEPARTMENT_OTHER): Payer: Medicare Other

## 2011-08-03 DIAGNOSIS — M249 Joint derangement, unspecified: Secondary | ICD-10-CM | POA: Insufficient documentation

## 2011-08-03 DIAGNOSIS — I10 Essential (primary) hypertension: Secondary | ICD-10-CM | POA: Insufficient documentation

## 2011-08-03 DIAGNOSIS — S93409A Sprain of unspecified ligament of unspecified ankle, initial encounter: Secondary | ICD-10-CM

## 2011-08-03 DIAGNOSIS — E785 Hyperlipidemia, unspecified: Secondary | ICD-10-CM | POA: Insufficient documentation

## 2011-08-03 DIAGNOSIS — N471 Phimosis: Secondary | ICD-10-CM | POA: Insufficient documentation

## 2011-08-03 DIAGNOSIS — L259 Unspecified contact dermatitis, unspecified cause: Secondary | ICD-10-CM | POA: Insufficient documentation

## 2011-08-03 DIAGNOSIS — M24176 Other articular cartilage disorders, unspecified foot: Secondary | ICD-10-CM | POA: Insufficient documentation

## 2011-08-03 DIAGNOSIS — Z8249 Family history of ischemic heart disease and other diseases of the circulatory system: Secondary | ICD-10-CM | POA: Insufficient documentation

## 2011-08-03 DIAGNOSIS — Z7982 Long term (current) use of aspirin: Secondary | ICD-10-CM | POA: Insufficient documentation

## 2011-08-03 DIAGNOSIS — N472 Paraphimosis: Secondary | ICD-10-CM

## 2011-08-03 DIAGNOSIS — Z823 Family history of stroke: Secondary | ICD-10-CM | POA: Insufficient documentation

## 2011-08-03 DIAGNOSIS — Z9089 Acquired absence of other organs: Secondary | ICD-10-CM | POA: Insufficient documentation

## 2011-08-03 LAB — URINALYSIS, ROUTINE W REFLEX MICROSCOPIC
Bilirubin Urine: NEGATIVE
Glucose, UA: NEGATIVE mg/dL
Ketones, ur: NEGATIVE mg/dL
Nitrite: NEGATIVE
Protein, ur: NEGATIVE mg/dL
pH: 5 (ref 5.0–8.0)

## 2011-08-03 MED ORDER — PREDNISONE 10 MG PO TABS: ORAL_TABLET | ORAL | Status: DC

## 2011-08-03 MED ORDER — HYDROXYZINE HCL 25 MG PO TABS: 25.0000 mg | ORAL_TABLET | Freq: Three times a day (TID) | ORAL | Status: AC | PRN

## 2011-08-03 MED ORDER — PREDNISONE 20 MG PO TABS
40.0000 mg | ORAL_TABLET | Freq: Once | ORAL | Status: AC
  Administered 2011-08-03: 40 mg via ORAL
  Filled 2011-08-03: qty 2

## 2011-08-03 NOTE — ED Notes (Signed)
Wife states that she made appt for pt at 215pm with PCP office to see "fill in doctor"-she decided to bring pt to ED thinking he would be seen for c/o sooner-EDPA in with pt-wife states she does not want pt to be seen by PA-EDPA spoke with pt and wife then to speak with EDP [place]

## 2011-08-03 NOTE — Discharge Instructions (Signed)
Contact Dermatitis Contact dermatitis is a reaction to certain substances that touch the skin. Contact dermatitis can be either irritant contact dermatitis or allergic contact dermatitis. Irritant contact dermatitis does not require previous exposure to the substance for a reaction to occur.Allergic contact dermatitis only occurs if you have been exposed to the substance before. Upon a repeat exposure, your body reacts to the substance.  CAUSES  Many substances can cause contact dermatitis. Irritant dermatitis is most commonly caused by repeated exposure to mildly irritating substances, such as:  Makeup.   Soaps.   Detergents.   Bleaches.   Acids.   Metal salts, such as nickel.  Allergic contact dermatitis is most commonly caused by exposure to:  Poisonous plants.   Chemicals (deodorants, shampoos).   Jewelry.   Latex.   Neomycin in triple antibiotic cream.   Preservatives in products, including clothing.  SYMPTOMS  The area of skin that is exposed may develop:  Dryness or flaking.   Redness.   Cracks.   Itching.   Pain or a burning sensation.   Blisters.  With allergic contact dermatitis, there may also be swelling in areas such as the eyelids, mouth, or genitals.  DIAGNOSIS  Your caregiver can usually tell what the problem is by doing a physical exam. In cases where the cause is uncertain and an allergic contact dermatitis is suspected, a patch skin test may be performed to help determine the cause of your dermatitis. TREATMENT Treatment includes protecting the skin from further contact with the irritating substance by avoiding that substance if possible. Barrier creams, powders, and gloves may be helpful. Your caregiver may also recommend:  Steroid creams or ointments applied 2 times daily. For best results, soak the rash area in cool water for 20 minutes. Then apply the medicine. Cover the area with a plastic wrap. You can store the steroid cream in the  refrigerator for a "chilly" effect on your rash. That may decrease itching. Oral steroid medicines may be needed in more severe cases.   Antibiotics or antibacterial ointments if a skin infection is present.   Antihistamine lotion or an antihistamine taken by mouth to ease itching.   Lubricants to keep moisture in your skin.   Burow's solution to reduce redness and soreness or to dry a weeping rash. Mix one packet or tablet of solution in 2 cups cool water. Dip a clean washcloth in the mixture, wring it out a bit, and put it on the affected area. Leave the cloth in place for 30 minutes. Do this as often as possible throughout the day.   Taking several cornstarch or baking soda baths daily if the area is too large to cover with a washcloth.  Harsh chemicals, such as alkalis or acids, can cause skin damage that is like a burn. You should flush your skin for 15 to 20 minutes with cold water after such an exposure. You should also seek immediate medical care after exposure. Bandages (dressings), antibiotics, and pain medicine may be needed for severely irritated skin.  HOME CARE INSTRUCTIONS  Avoid the substance that caused your reaction.   Keep the area of skin that is affected away from hot water, soap, sunlight, chemicals, acidic substances, or anything else that would irritate your skin.   Do not scratch the rash. Scratching may cause the rash to become infected.   You may take cool baths to help stop the itching.   Only take over-the-counter or prescription medicines as directed by your caregiver.     See your caregiver for follow-up care as directed to make sure your skin is healing properly.  SEEK MEDICAL CARE IF:   Your condition is not better after 3 days of treatment.   You seem to be getting worse.   You see signs of infection such as swelling, tenderness, redness, soreness, or warmth in the affected area.   You have any problems related to your medicines.  Document Released:  01/21/2000 Document Revised: 01/12/2011 Document Reviewed: 06/28/2010 [facility] Patient Information 2012 [place], [place].Paraphimosis Paraphimosis is a condition that involves the foreskin of the penis (the fold of skin that stretches over the tip of the penis). It can occur in boys or men who have not been circumcised (had the foreskin removed). Or, it can develop if the entire foreskin was not removed in circumcision. It also can occur if the procedure was done incorrectly.  Paraphimosis can develop if the foreskin is very tight. It can become stuck when it is pulled back. It cannot be returned to its normal position. This can keep blood from flowing to the tip of the penis. It can cause swelling and pain. Infection can result. This is a serious condition. It can be an emergency. Do not delay in getting treatment. CAUSES  Causes of paraphimosis include:  A foreskin that is tighter than normal.   Infection under the foreskin. This could be caused by:   Not cleaning the tip of the penis well.   Genital piercing.   Another medical condition, such as diabetes.   Trauma to the area. For example, the tip of the penis might have been hit hard.   A foreskin that has been pulled back for too long.   A forceful retraction of the foreskin in attempting to clean the penis. This can cause the head of the penis to swell. Then, the foreskin cannot return to its natural position.  SYMPTOMS   When the foreskin is retracted (pulled back), it stays in that position.   Painoften at the tip of the penis.   Swelling of the penis.  DIAGNOSIS  To decide if you have paraphimosis, a healthcare provider will probably:  Ask about symptoms you have noticed.   Ask about your health overall.   Ask about any problems you have had with the penis or foreskin.   Examine the penis.  TREATMENT  Paraphimosis can be treated several ways. Most of the time, treatment can be done in a clinic or a healthcare  provider's office. Be sure to discuss the different options with your caregiver. They include:  Forcing the foreskin back over the tip of the penis. You may be given painkillers for this procedure. Also, any swelling must be reduced first. To do this:   Ice may be used.   A bandage may be wrapped tightly around the penis.   Needles may be used to drain any pus or blood that is causing the swelling.   Dorsal slit procedure. A small cut is made in the tightened foreskin. This frees it. Then, it can be pulled back into place.   Circumcision. This is surgery to remove the foreskin. It may be needed if the foreskin cannot be moved back into place. This also would prevent paraphimosis from developing again.  HOME CARE INSTRUCTIONS   The treated area may be covered with gauze or a bandage. What you will need to do will depend on the treatment you had. Ask:   How often the bandage should be changed?  If the area can get wet?   When you can leave the skin uncovered?   Apply any creams that your healthcare provider prescribed. Follow the directions carefully.   Take any medications prescribed by your healthcare provided. Follow the directions carefully.   Learn how to clean and care for the tip of the penis after treatment. After the swelling has subsided, talk to your caregiver about the proper way to care for the hygiene of the penis.  SEEK MEDICAL CARE IF:   The treated area of skin does not heal. It can become more irritated and red or bloody.   Urination is difficult or painful.   Pain in the penis continues, even after taking pain medication.   You have any questions about how to use creams or medications that were prescribed.   You develop a fever of more than 101 F (38.3 C).  SEEK IMMEDIATE MEDICAL CARE IF:  You develop a fever of more than 102.0 F (38.9 C) Document Released: 11/20/2008 Document Revised: 01/12/2011 Document Reviewed: 11/20/2008 [facility] Patient  Information 2012 [place], [place].Paraphimosis Paraphimosis is a condition that involves the foreskin of the penis (the fold of skin that stretches over the tip of the penis). It can occur in boys or men who have not been circumcised (had the foreskin removed). Or, it can develop if the entire foreskin was not removed in circumcision. It also can occur if the procedure was done incorrectly.  Paraphimosis can develop if the foreskin is very tight. It can become stuck when it is pulled back. It cannot be returned to its normal position. This can keep blood from flowing to the tip of the penis. It can cause swelling and pain. Infection can result. This is a serious condition. It can be an emergency. Do not delay in getting treatment. CAUSES  Causes of paraphimosis include:  A foreskin that is tighter than normal.   Infection under the foreskin. This could be caused by:   Not cleaning the tip of the penis well.   Genital piercing.   Another medical condition, such as diabetes.   Trauma to the area. For example, the tip of the penis might have been hit hard.   A foreskin that has been pulled back for too long.   A forceful retraction of the foreskin in attempting to clean the penis. This can cause the head of the penis to swell. Then, the foreskin cannot return to its natural position.  SYMPTOMS   When the foreskin is retracted (pulled back), it stays in that position.   Painoften at the tip of the penis.   Swelling of the penis.  DIAGNOSIS  To decide if you have paraphimosis, a healthcare provider will probably:  Ask about symptoms you have noticed.   Ask about your health overall.   Ask about any problems you have had with the penis or foreskin.   Examine the penis.  TREATMENT  Paraphimosis can be treated several ways. Most of the time, treatment can be done in a clinic or a healthcare provider's office. Be sure to discuss the different options with your caregiver. They  include:  Forcing the foreskin back over the tip of the penis. You may be given painkillers for this procedure. Also, any swelling must be reduced first. To do this:   Ice may be used.   A bandage may be wrapped tightly around the penis.   Needles may be used to drain any pus or blood that is causing the  swelling.   Dorsal slit procedure. A small cut is made in the tightened foreskin. This frees it. Then, it can be pulled back into place.   Circumcision. This is surgery to remove the foreskin. It may be needed if the foreskin cannot be moved back into place. This also would prevent paraphimosis from developing again.  HOME CARE INSTRUCTIONS   The treated area may be covered with gauze or a bandage. What you will need to do will depend on the treatment you had. Ask:   How often the bandage should be changed?   If the area can get wet?   When you can leave the skin uncovered?   Apply any creams that your healthcare provider prescribed. Follow the directions carefully.   Take any medications prescribed by your healthcare provided. Follow the directions carefully.   Learn how to clean and care for the tip of the penis after treatment. After the swelling has subsided, talk to your caregiver about the proper way to care for the hygiene of the penis.  SEEK MEDICAL CARE IF:   The treated area of skin does not heal. It can become more irritated and red or bloody.   Urination is difficult or painful.   Pain in the penis continues, even after taking pain medication.   You have any questions about how to use creams or medications that were prescribed.   You develop a fever of more than 101 F (38.3 C).  SEEK IMMEDIATE MEDICAL CARE IF:  You develop a fever of more than 102.0 F (38.9 C) Document Released: 11/20/2008 Document Revised: 01/12/2011 Document Reviewed: 11/20/2008 [facility] Patient Information 2012 [place], [place].

## 2011-08-03 NOTE — ED Notes (Signed)
Pt states he is also concerned with swelling to penis and both feet

## 2011-08-03 NOTE — ED Notes (Signed)
C/o rash to both arms, hands, legs x 2-3 days-started after cutting shrubs

## 2011-08-03 NOTE — ED Notes (Addendum)
MD at bedside. Foreskin manually manipulated over glans successfully.  Pt tolerated well.  Pt given thorough instructions by MD on doing this at home.  Pt verbalized understanding of procedure as well as followup care.

## 2011-08-03 NOTE — ED Provider Notes (Signed)
History     CSN: [id number]  Arrival date & time 08/03/11  1256   First MD Initiated Contact with Patient 08/03/11 1402      Chief Complaint  Patient presents with  . Rash  . Groin Swelling    (Consider location/radiation/quality/duration/timing/severity/associated sxs/prior treatment) HPI Patient is an 78 year old male who presents today for evaluation of multiple problems. The first of these is an erythematous macular rash localized over the bilateral lower arms consistent with prior episodes of poison ivy. 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 His wife has been applying Caladryl but patient reports his itching has worsened. Additionally he has had some spread to his legs. Patient denies any difficulty with breathing, fevers, nausea, vomiting, or abdominal pain. Patient also complains of some persistent right ankle pain localized around the right lateral malleolus. There some associated ecchymosis but no known injury. Patient has been ambulatory but his wife reports that if he touches this wrong that he has pain and she is very concerned about this.  Patient reports pain with this is only a very minor ache. Also, the patient complains of daily swelling of his body. He is most concerned with this in his lower extremities. He reports that this gets worse when he lays down at night and is better when he is up and moving. Patient reports that when he stands up after being asleep all night it feels as though "his feet are on pillows". Patient has no history of heart failure or other conditions predisposing to fluid overload. Patient notes specifically that he is very concerned about some swelling he has at the tip of his penis today. This just began earlier today per his report. This has never happened before. He denies any pain with this but does note that there is redness at the end of his penis as well. He also has been  unable to urinate since waking this morning. He does not feel that his abdomen is distended but denies any abdominal tenderness, nausea, or vomiting as a result of this. He has not had a bowel movement today either but does not consider this abnormal. Patient had an appointment to see his primary care physician today but apparently came to the ED as they wanted him to be seen sooner. There are no other associated or modifying factors. Past Medical History  Diagnosis Date  . Hyperlipidemia   . Hypertension     Past Surgical History  Procedure Date  . Carotid endarterectomy 03-05-09    right CEA  . Appendectomy   . Hernia repair   . Lipoma excision     Family History  Problem Relation Age of Onset  . Heart disease Sister   . Heart disease Brother   . Stroke Brother   . Heart disease Brother   . Hyperlipidemia Son     History  Substance Use Topics  . Smoking status: Never Smoker   . Smokeless tobacco: Not on file  . Alcohol Use: No      Review of Systems  Constitutional: Negative.   HENT: Negative.   Eyes: Negative.   Respiratory: Negative.   Cardiovascular: Positive for leg swelling.  Gastrointestinal: Negative.   Genitourinary: Positive for penile swelling and difficulty urinating. Negative for hematuria, discharge, genital sores and penile pain.  Musculoskeletal:       Ankle pain  Skin: Positive for rash.  Neurological: Negative.   Hematological: Negative.  Psychiatric/Behavioral: Negative.   All other systems reviewed and are negative.    Allergies  Review of patient's allergies indicates no known allergies.  Home Medications   Current Outpatient Rx  Name Route Sig Dispense Refill  . ASPIRIN 81 MG PO TABS Oral Take 160 mg by mouth daily.    . COQ-10 100 MG PO CAPS Oral Take by mouth daily.    . OMEGA-3 FATTY ACIDS 1000 MG PO CAPS Oral Take 1 g by mouth daily.    Marland Kitchen ONE-DAILY MULTI VITAMINS PO TABS Oral Take 1 tablet by mouth daily.    Marland Kitchen ROSUVASTATIN  CALCIUM 5 MG PO TABS Oral Take 5 mg by mouth daily.    Marland Kitchen ZOLPIDEM TARTRATE 5 MG PO TABS Oral Take 5 mg by mouth at bedtime as needed.    Marland Kitchen HYDROXYZINE HCL 25 MG PO TABS Oral Take 1 tablet (25 mg total) by mouth every 8 (eight) hours as needed for itching. 12 tablet 0  . PREDNISONE 10 MG PO TABS  Take 4 tabs by mouth daily x4 days. Then take 3 tabs by mouth daily x5 days. Then take 2 tabs by mouth daily x5 days. Then take 1 tab by mouth daily x3 days. Then take one half tab by mouth daily x2 days. Then stop. 45 tablet 0    BP 144/71  Pulse 92  Temp 97.8 F (36.6 C) (Oral)  Resp 20  Ht 5\' 11"  (1.803 m)  Wt 155 lb (70.308 kg)  BMI 21.62 kg/m2  SpO2 98%  Physical Exam  Nursing note and vitals reviewed. GEN: Well-developed, well-nourished, pleasant elderly male in no distress HEENT: Atraumatic, normocephalic.  EYES: PERRLA BL, no scleral icterus. NECK: Trachea midline, no meningismus CV: regular rate and rhythm. No murmurs, rubs, or gallops PULM: No respiratory distress.  No crackles, wheezes, or rales. GI: soft, non-tender. No guarding, rebound, or tenderness. + bowel sounds  GU: Patient is an uncircumcised male with paraphimosis noted on exam. Patient is not tender to palpation in this area is fluctuant without being tense. There is erythema overlying the distal third of the penis in this area. No scrotal swelling, tenderness, or masses noted. Neuro: cranial nerves grossly 2-12 intact, no abnormalities of strength or sensation, A and O x 3 MSK: Patient moves all 4 extremities symmetrically.  Patient with mild ecchymosis, swelling, and tenderness to palpation of the right lateral malleolus. Otherwise within normal limits Skin: Patient with erythematous macules noted over the bilateral upper extremities consistent with contact dermatitis from poison ivy  Psych: no abnormality of mood   ED Course  Procedures (including critical care time)  Labs Reviewed  URINALYSIS, ROUTINE W REFLEX  MICROSCOPIC - Abnormal; Notable for the following:    Color, Urine AMBER (*)  BIOCHEMICALS MAY BE AFFECTED BY COLOR   All other components within normal limits   Dg Ankle Complete Right  08/03/2011  *RADIOLOGY REPORT*  Clinical Data: Rash, groin swelling  RIGHT ANKLE - COMPLETE 3+ VIEW  Comparison: None.  Findings: Three views of the right ankle submitted.  No acute fracture or subluxation.  Ankle mortise is preserved. Atherosclerotic arterial calcifications are noted.  IMPRESSION: No acute fracture or subluxation.  Original Report Authenticated By: Natasha Mead, M.D.     1. Contact dermatitis   2. Ankle sprain and strain   3. Paraphimosis       MDM  Patient was evaluated by myself. My physician assistant had attempted to evaluate the patient initially however patient's wife refused  evaluation by PA. I was notified appropriately at that time and went to evaluate the patient as soon as this occurred. On my evaluation patient had swelling only at his area of concern over the penis. This is consistent with paraphimosis. Patient and family discussed swelling with me their concerns over this. Given that patient had no findings to suggest heart failure or other fluid overload today I discussed that they followup on this concern with her primary care physician. Patient was hemodynamically stable with no chest pain or shortness of breath. He also had no edema whatsoever except for his paraphimosis. Patient did have rash consistent with contact dermatitis. Given that the family felt that this was spreading as well as at the patient was having itching I was comfortable with giving the patient a prednisone taper. This was prescribed for 3 weeks to prevent rebound worsening of the patient's symptoms in reaction to too short of a course. The patient was content not to have this on her initial discussion but his wife was very concerned and patient eventually decided that he would like to have this. Patient was also  given a prescription for hydroxyzine as needed for itching. Similar discussion regarding ankle imaging was had between the patient and his wife. To rule out any concerns for bony injury a plain film was performed and showed no acute bony injury. Patient did not require brace or other support today. He did receive ice for this. Patient has no history of paraphimosis. I contacted Dr. Vernie Ammons of urology as patient had been unable to urinate. We discussed reduction of the paraphimosis and I returned to the patient's room to perform this myself. At that time patient had urinated in his urine was sent. There were no signs of infection. I was able to manually reduce the patient's paraphimosis. He tolerated this well and was instructed on how to perform this himself if his symptoms recur. Given his concerns over numerous areas of swelling and did advise elevation of the penis to prevent recurrence of this. I also gave him the contact information for [facility] should this become a recurrent problem which requires urologic followup.  Patient was discharged in good condition and advised to followup with his primary care physician.       Cyndra Numbers, MD 08/05/11 [phone]

## 2011-08-03 NOTE — ED Notes (Signed)
Patient reports rash on both arms and some on the legs.  Patient also reports swelling on the "end of his penis"

## 2011-11-07 HISTORY — PX: COLONOSCOPY: SHX174

## 2011-12-11 ENCOUNTER — Other Ambulatory Visit: Payer: Self-pay | Admitting: Family Medicine

## 2011-12-11 DIAGNOSIS — M5430 Sciatica, unspecified side: Secondary | ICD-10-CM

## 2011-12-13 ENCOUNTER — Ambulatory Visit
Admission: RE | Admit: 2011-12-13 | Discharge: 2011-12-13 | Disposition: A | Discharge status: Home / Self Care | Payer: Medicare Other | Source: Ambulatory Visit | Attending: Family Medicine | Admitting: Family Medicine

## 2011-12-13 DIAGNOSIS — M5430 Sciatica, unspecified side: Secondary | ICD-10-CM

## 2011-12-20 ENCOUNTER — Telehealth: Payer: Self-pay

## 2011-12-20 NOTE — Telephone Encounter (Signed)
Phone call from pt. To report symptoms / and to request his f/u appt. be scheduled sooner than January 2014.  Stated he has been experiencing "pressure in the top of his head the past 2-3 weeks".  Denies vision loss, but states has had blurring of vision in both eyes.  Questioned about weakness, unsteadiness, numbness tingling.  Pt. stated he has had some unsteadiness with walking distance; explained when he walks approx. 1 mi. He notices he gets unsteady at the end of his mile walk.  States he has had some tingling in his hands intermittently, at night. States has had some nymbness in both feet, at times. Reports BP 130/75, and pulse rate in the 60's.  States has next f/u appt. In January, and wants to be evaluated sooner.  Discussed w/ Dr. Hart Rochester.  Recommends to schedule pt. For earlier carotid duplex and office visit with the nurse practitioner, on a day when Dr. Hart Rochester is in the office.

## 2011-12-25 ENCOUNTER — Encounter: Payer: Self-pay | Admitting: Neurosurgery

## 2011-12-26 ENCOUNTER — Encounter: Payer: Self-pay | Admitting: Neurosurgery

## 2011-12-26 ENCOUNTER — Ambulatory Visit (INDEPENDENT_AMBULATORY_CARE_PROVIDER_SITE_OTHER): Payer: Medicare Other | Admitting: Neurosurgery

## 2011-12-26 ENCOUNTER — Other Ambulatory Visit (INDEPENDENT_AMBULATORY_CARE_PROVIDER_SITE_OTHER): Payer: Medicare Other | Admitting: *Deleted

## 2011-12-26 VITALS — BP 142/88 | HR 54 | Resp 18 | Ht 71.0 in | Wt 159.7 lb

## 2011-12-26 DIAGNOSIS — M79609 Pain in unspecified limb: Secondary | ICD-10-CM

## 2011-12-26 DIAGNOSIS — I6529 Occlusion and stenosis of unspecified carotid artery: Secondary | ICD-10-CM

## 2011-12-26 DIAGNOSIS — Z48812 Encounter for surgical aftercare following surgery on the circulatory system: Secondary | ICD-10-CM

## 2011-12-26 NOTE — Progress Notes (Signed)
[facility] Carotid Office Note  CC: Carotid surveillance Referring Physician: Hart Rochester  History of Present Illness: 78 year old male patient of Dr. Hart Rochester status post right CEA in 2011. The patient denies any signs or symptoms of CVA, TIA, amaurosis fugax. The patient does state he has had some "lightheadedness" and complaints of paresthesias in his lower extremities with long walks but no true claudication or rest pain. The patient also states he had problems with poison ivy early in the year that affected his lower extremities and genitals and I explained to him he could have some superficial nerve damage from that virus. The patient has agreed to followup with his primary care doctor regarding those problems.  Past Medical History  Diagnosis Date  . Hyperlipidemia   . Hypertension     ROS: [x]  Positive   [ ]  Denies    General: [ ]  Weight loss, [ ]  Fever, [ ]  chills Neurologic: [x ] Dizziness, [ ]  Blackouts, [ ]  Seizure [ ]  Stroke, [ ]  "Mini stroke", [ ]  Slurred speech, [ ]  Temporary blindness; [ ]  weakness in arms or legs, [ ]  Hoarseness Cardiac: [ ]  Chest pain/pressure, [x ] Shortness of breath at rest [ ]  Shortness of breath with exertion, [x ] Atrial fibrillation or irregular heartbeat Vascular: [x ] Pain in legs with walking, [ ]  Pain in legs at rest, [ ]  Pain in legs at night,  [ ]  Non-healing ulcer, [ ]  Blood clot in vein/DVT,   Pulmonary: [ ]  Home oxygen, [ ]  Productive cough, [ ]  Coughing up blood, [ ]  Asthma,  [ ]  Wheezing Musculoskeletal:  [ ]  Arthritis, [ ]  Low back pain, [ ]  Joint pain Hematologic: [ ]  Easy Bruising, [ ]  Anemia; [ ]  Hepatitis Gastrointestinal: [ ]  Blood in stool, [ ]  Gastroesophageal Reflux/heartburn, [ ]  Trouble swallowing Urinary: [ ]  chronic Kidney disease, [ ]  on HD - [ ]  MWF or [ ]  TTHS, [ ]  Burning with urination, [ ]  Difficulty urinating Skin: [ ]  Rashes, [ ]  Wounds Psychological: [ ]  Anxiety, [ ]  Depression   Social  History History  Substance Use Topics  . Smoking status: Never Smoker   . Smokeless tobacco: Not on file  . Alcohol Use: No    Family History Family History  Problem Relation Age of Onset  . Heart disease Sister   . Heart disease Brother   . Stroke Brother   . Heart disease Brother   . Hyperlipidemia Son     No Known Allergies  Current Outpatient Prescriptions  Medication Sig Dispense Refill  . aspirin 81 MG tablet Take 160 mg by mouth daily.      . Coenzyme Q10 (COQ-10) 100 MG CAPS Take by mouth daily.      . fish oil-omega-3 fatty acids 1000 MG capsule Take 1 g by mouth daily.      . Multiple Vitamin (MULTIVITAMIN) tablet Take 1 tablet by mouth daily.      . predniSONE (DELTASONE) 10 MG tablet Take 4 tabs by mouth daily x4 days. Then take 3 tabs by mouth daily x5 days. Then take 2 tabs by mouth daily x5 days. Then take 1 tab by mouth daily x3 days. Then take one half tab by mouth daily x2 days. Then stop.  45 tablet  0  . rosuvastatin (CRESTOR) 5 MG tablet Take 5 mg by mouth daily.      Marland Kitchen zolpidem (AMBIEN) 5 MG tablet Take 5 mg by mouth at  bedtime as needed.        Physical Examination  Filed Vitals:   12/26/11 1141  BP: 142/88  Pulse: 54  Resp:     Body mass index is 22.27 kg/(m^2).  General:  WDWN in NAD Gait: Normal HEENT: WNL Eyes: Pupils equal Pulmonary: normal non-labored breathing , without Rales, rhonchi,  wheezing Cardiac: RRR, without  Murmurs, rubs or gallops; Abdomen: soft, NT, no masses Skin: no rashes, ulcers noted  Vascular Exam Pulses: 3+ radial pulses bilaterally, PT and DP pulses are palpable Carotid bruits: Carotid pulses to auscultation no bruits are heard Extremities without ischemic changes, no Gangrene , no cellulitis; no open wounds;  Musculoskeletal: no muscle wasting or atrophy   Neurologic: A&O X 3; Appropriate Affect ; SENSATION: normal; MOTOR FUNCTION:  moving all extremities equally. Speech is fluent/normal  Non-Invasive  Vascular Imaging CAROTID DUPLEX 12/26/2011  Right ICA 0 - 19% stenosis Left ICA 20 - 39 % stenosis   ASSESSMENT/PLAN: Asymptomatic carotid patient with complaints of paresthesias with long walks and at rest. The patient has requested lower extremity exam some point. The patient will followup in 2-3 months with ABIs and to see Dr. Hart Rochester per his request. The patient will followup in one year with repeat carotid studies. The patient's questions were encouraged and answered, he is in agreement with this plan. The patient will followup with his primary care physician regarding his other issues.  Lauree Chandler ANP  Clinic MD: Hart Rochester

## 2011-12-27 NOTE — Addendum Note (Signed)
Addended by: Sharee Pimple on: 12/27/2011 11:18 AM   Modules accepted: Orders

## 2012-02-26 ENCOUNTER — Encounter: Payer: Self-pay | Admitting: Vascular Surgery

## 2012-02-27 ENCOUNTER — Encounter (INDEPENDENT_AMBULATORY_CARE_PROVIDER_SITE_OTHER): Payer: Medicare Other | Admitting: *Deleted

## 2012-02-27 ENCOUNTER — Encounter: Payer: Self-pay | Admitting: Vascular Surgery

## 2012-02-27 ENCOUNTER — Ambulatory Visit (INDEPENDENT_AMBULATORY_CARE_PROVIDER_SITE_OTHER): Payer: Medicare Other | Admitting: Vascular Surgery

## 2012-02-27 VITALS — BP 163/87 | HR 63 | Resp 16 | Ht 71.0 in | Wt 161.8 lb

## 2012-02-27 DIAGNOSIS — I739 Peripheral vascular disease, unspecified: Secondary | ICD-10-CM

## 2012-02-27 DIAGNOSIS — R209 Unspecified disturbances of skin sensation: Secondary | ICD-10-CM | POA: Diagnosis not present

## 2012-02-27 DIAGNOSIS — M79609 Pain in unspecified limb: Secondary | ICD-10-CM

## 2012-02-27 DIAGNOSIS — Z48812 Encounter for surgical aftercare following surgery on the circulatory system: Secondary | ICD-10-CM

## 2012-02-27 DIAGNOSIS — R202 Paresthesia of skin: Secondary | ICD-10-CM | POA: Insufficient documentation

## 2012-02-27 DIAGNOSIS — R200 Anesthesia of skin: Secondary | ICD-10-CM

## 2012-02-27 NOTE — Progress Notes (Signed)
Subjective:     Patient ID: William Mann, male   DOB: 01/26/1930, 79 y.o.   MRN: [id number]  HPI this 79 year old male returns for further followup regarding his lower extremity discomfort. This usually occurs at night. Sometimes he has hypersensitivity of the lateral aspect of both legs down to the knee level which requires and to get out of bed. He has no history of cellulitis infection gangrene or rest pain in the feet. He is able to ambulate long distances. His carotid followup at last visit revealed no evidence of significant recurrent disease and he denies any neurologic symptoms.  Past Medical History  Diagnosis Date  . Hyperlipidemia   . Hypertension   . Coronary artery disease   . Carotid artery occlusion     History  Substance Use Topics  . Smoking status: Never Smoker   . Smokeless tobacco: Never Used  . Alcohol Use: No    Family History  Problem Relation Age of Onset  . Heart disease Sister   . Cancer Sister   . Hypertension Sister   . Heart attack Sister   . Heart disease Brother   . Stroke Brother   . Hypertension Brother   . Heart disease Brother   . Hyperlipidemia Son     No Known Allergies  Current outpatient prescriptions:aspirin 81 MG tablet, Take 160 mg by mouth daily., Disp: , Rfl: ;  Coenzyme Q10 (COQ-10) 100 MG CAPS, Take by mouth daily., Disp: , Rfl: ;  Multiple Vitamin (MULTIVITAMIN) tablet, Take 1 tablet by mouth daily., Disp: , Rfl: ;  rosuvastatin (CRESTOR) 5 MG tablet, Take 5 mg by mouth daily., Disp: , Rfl: ;  zolpidem (AMBIEN) 5 MG tablet, Take 5 mg by mouth at bedtime as needed., Disp: , Rfl:  fish oil-omega-3 fatty acids 1000 MG capsule, Take 1 g by mouth daily., Disp: , Rfl: ;  predniSONE (DELTASONE) 10 MG tablet, Take 4 tabs by mouth daily x4 days. Then take 3 tabs by mouth daily x5 days. Then take 2 tabs by mouth daily x5 days. Then take 1 tab by mouth daily x3 days. Then take one half tab by mouth daily x2 days. Then stop., Disp: 45 tablet,  Rfl: 0  BP 163/87  Pulse 63  Resp 16  Ht 5\' 11"  (1.803 m)  Wt 161 lb 12.8 oz (73.392 kg)  BMI 22.57 kg/m2  SpO2 98%  Body mass index is 22.57 kg/(m^2).          Review of Systems denies chest pain, dyspnea on exertion, PND, orthopnea, hemoptysis    Objective:   Physical Exam blood pressure 163/87 heart rate 63 respirations 16 General well-developed well-nourished male no apparent stress alert and oriented x3 Lungs no rhonchi or wheezing Cardiovascular regular rhythm no murmurs carotid pulses 3+ no audible bruits Lower extremities with 3+ femoral and 2-3+ posterior tibial pulses palpable bilaterally. He has prominent veins in his lower extremities but no chronic hyperpigmentation or ulceration noted.  Today I ordered lower extremity arterial Dopplers which were normal with triphasic flow ABIs of 1.3 bilaterally    Assessment:     Bilateral lower extremity discomfort at night-no evidence of arterial insufficiency No evidence of recurrent carotid occlusive disease    Plan:     Return in 10-12 months with followup carotid duplex exam Do not believe his leg discomfort is related to arterial or venous insufficiency

## 2012-03-13 ENCOUNTER — Ambulatory Visit: Payer: Medicare Other | Admitting: Neurosurgery

## 2012-03-13 ENCOUNTER — Other Ambulatory Visit: Payer: Medicare Other

## 2012-03-14 ENCOUNTER — Other Ambulatory Visit: Payer: Medicare Other

## 2012-03-14 ENCOUNTER — Ambulatory Visit: Payer: Medicare Other | Admitting: Neurosurgery

## 2012-06-03 DIAGNOSIS — I8390 Asymptomatic varicose veins of unspecified lower extremity: Secondary | ICD-10-CM | POA: Diagnosis not present

## 2012-06-03 DIAGNOSIS — K3189 Other diseases of stomach and duodenum: Secondary | ICD-10-CM | POA: Diagnosis not present

## 2012-06-03 DIAGNOSIS — E782 Mixed hyperlipidemia: Secondary | ICD-10-CM | POA: Diagnosis not present

## 2012-06-03 DIAGNOSIS — G4700 Insomnia, unspecified: Secondary | ICD-10-CM | POA: Diagnosis not present

## 2012-06-03 DIAGNOSIS — N400 Enlarged prostate without lower urinary tract symptoms: Secondary | ICD-10-CM | POA: Diagnosis not present

## 2012-06-03 DIAGNOSIS — R1013 Epigastric pain: Secondary | ICD-10-CM | POA: Diagnosis not present

## 2012-06-10 DIAGNOSIS — I8310 Varicose veins of unspecified lower extremity with inflammation: Secondary | ICD-10-CM | POA: Diagnosis not present

## 2012-07-08 DIAGNOSIS — Z9889 Other specified postprocedural states: Secondary | ICD-10-CM | POA: Diagnosis not present

## 2012-07-08 DIAGNOSIS — K3189 Other diseases of stomach and duodenum: Secondary | ICD-10-CM | POA: Diagnosis not present

## 2012-07-08 DIAGNOSIS — N400 Enlarged prostate without lower urinary tract symptoms: Secondary | ICD-10-CM | POA: Diagnosis not present

## 2012-07-08 DIAGNOSIS — R42 Dizziness and giddiness: Secondary | ICD-10-CM | POA: Diagnosis not present

## 2012-07-08 DIAGNOSIS — F329 Major depressive disorder, single episode, unspecified: Secondary | ICD-10-CM | POA: Diagnosis not present

## 2012-07-11 ENCOUNTER — Telehealth: Payer: Self-pay

## 2012-07-11 NOTE — Telephone Encounter (Signed)
Pt. Called to report episodes of dizziness at night when he 1st lays down on either left side or back; episodes last approx. 10 seconds.  Reports has been going on x 2 wks., and "is getting more intense."  Denies dizziness during day with activity.  Does report some unsteadiness at times.  Denies any change in speech, weakness of extremities, or vision changes.  Next f/u carotid duplex scheduled for 12/14/12.  Advised will discuss with the doctor and return call to pt.

## 2012-07-11 NOTE — Telephone Encounter (Signed)
Discussed pt's symptoms with Dr. Darrick Penna.  Stated pt. should see his PCP, or if followed by a neurologist, could see that MD for further evaluation of dizziness.  Pt. notified of the above recommendation.  Verb. Understanding.

## 2012-07-15 ENCOUNTER — Other Ambulatory Visit: Payer: Self-pay | Admitting: Dermatology

## 2012-07-15 DIAGNOSIS — D045 Carcinoma in situ of skin of trunk: Secondary | ICD-10-CM | POA: Diagnosis not present

## 2012-07-15 DIAGNOSIS — C44611 Basal cell carcinoma of skin of unspecified upper limb, including shoulder: Secondary | ICD-10-CM | POA: Diagnosis not present

## 2012-07-15 DIAGNOSIS — L570 Actinic keratosis: Secondary | ICD-10-CM | POA: Diagnosis not present

## 2012-07-15 DIAGNOSIS — L820 Inflamed seborrheic keratosis: Secondary | ICD-10-CM | POA: Diagnosis not present

## 2012-07-15 DIAGNOSIS — D1801 Hemangioma of skin and subcutaneous tissue: Secondary | ICD-10-CM | POA: Diagnosis not present

## 2012-07-15 DIAGNOSIS — L821 Other seborrheic keratosis: Secondary | ICD-10-CM | POA: Diagnosis not present

## 2012-07-15 DIAGNOSIS — C4441 Basal cell carcinoma of skin of scalp and neck: Secondary | ICD-10-CM | POA: Diagnosis not present

## 2012-07-15 DIAGNOSIS — C44320 Squamous cell carcinoma of skin of unspecified parts of face: Secondary | ICD-10-CM | POA: Diagnosis not present

## 2012-07-18 ENCOUNTER — Other Ambulatory Visit (HOSPITAL_BASED_OUTPATIENT_CLINIC_OR_DEPARTMENT_OTHER): Payer: Self-pay | Admitting: Family Medicine

## 2012-07-18 DIAGNOSIS — E782 Mixed hyperlipidemia: Secondary | ICD-10-CM | POA: Diagnosis not present

## 2012-07-18 DIAGNOSIS — F329 Major depressive disorder, single episode, unspecified: Secondary | ICD-10-CM | POA: Diagnosis not present

## 2012-07-18 DIAGNOSIS — R42 Dizziness and giddiness: Secondary | ICD-10-CM | POA: Diagnosis not present

## 2012-07-20 ENCOUNTER — Ambulatory Visit (HOSPITAL_BASED_OUTPATIENT_CLINIC_OR_DEPARTMENT_OTHER)
Admission: RE | Admit: 2012-07-20 | Discharge: 2012-07-20 | Disposition: A | Discharge status: Home / Self Care | Payer: Medicare Other | Source: Ambulatory Visit | Attending: Family Medicine | Admitting: Family Medicine

## 2012-07-20 DIAGNOSIS — I6789 Other cerebrovascular disease: Secondary | ICD-10-CM | POA: Diagnosis not present

## 2012-07-20 DIAGNOSIS — G9389 Other specified disorders of brain: Secondary | ICD-10-CM | POA: Diagnosis not present

## 2012-07-20 DIAGNOSIS — R42 Dizziness and giddiness: Secondary | ICD-10-CM

## 2012-07-20 DIAGNOSIS — E785 Hyperlipidemia, unspecified: Secondary | ICD-10-CM | POA: Insufficient documentation

## 2012-07-20 DIAGNOSIS — I10 Essential (primary) hypertension: Secondary | ICD-10-CM | POA: Diagnosis not present

## 2012-07-20 MED ORDER — GADOBENATE DIMEGLUMINE 529 MG/ML IV SOLN
15.0000 mL | Freq: Once | INTRAVENOUS | Status: AC | PRN
  Administered 2012-07-20: 15 mL via INTRAVENOUS

## 2012-08-20 DIAGNOSIS — Z85828 Personal history of other malignant neoplasm of skin: Secondary | ICD-10-CM | POA: Diagnosis not present

## 2012-08-20 DIAGNOSIS — C44320 Squamous cell carcinoma of skin of unspecified parts of face: Secondary | ICD-10-CM | POA: Diagnosis not present

## 2012-08-26 ENCOUNTER — Ambulatory Visit
Admission: RE | Admit: 2012-08-26 | Discharge: 2012-08-26 | Disposition: A | Discharge status: Home / Self Care | Payer: Medicare Other | Source: Ambulatory Visit | Attending: Family Medicine | Admitting: Family Medicine

## 2012-08-26 ENCOUNTER — Other Ambulatory Visit: Payer: Self-pay | Admitting: Family Medicine

## 2012-08-26 DIAGNOSIS — M1710 Unilateral primary osteoarthritis, unspecified knee: Secondary | ICD-10-CM | POA: Diagnosis not present

## 2012-08-26 DIAGNOSIS — M25569 Pain in unspecified knee: Secondary | ICD-10-CM | POA: Diagnosis not present

## 2012-08-26 DIAGNOSIS — M25562 Pain in left knee: Secondary | ICD-10-CM

## 2012-08-27 DIAGNOSIS — C4491 Basal cell carcinoma of skin, unspecified: Secondary | ICD-10-CM | POA: Diagnosis not present

## 2012-08-27 DIAGNOSIS — Z85828 Personal history of other malignant neoplasm of skin: Secondary | ICD-10-CM | POA: Diagnosis not present

## 2012-09-09 ENCOUNTER — Other Ambulatory Visit (HOSPITAL_BASED_OUTPATIENT_CLINIC_OR_DEPARTMENT_OTHER): Payer: Self-pay | Admitting: Family Medicine

## 2012-09-09 ENCOUNTER — Ambulatory Visit (HOSPITAL_BASED_OUTPATIENT_CLINIC_OR_DEPARTMENT_OTHER)
Admission: RE | Admit: 2012-09-09 | Discharge: 2012-09-09 | Disposition: A | Discharge status: Home / Self Care | Payer: Medicare Other | Source: Ambulatory Visit | Attending: Family Medicine | Admitting: Family Medicine

## 2012-09-09 DIAGNOSIS — I8390 Asymptomatic varicose veins of unspecified lower extremity: Secondary | ICD-10-CM | POA: Insufficient documentation

## 2012-09-09 DIAGNOSIS — I2510 Atherosclerotic heart disease of native coronary artery without angina pectoris: Secondary | ICD-10-CM | POA: Insufficient documentation

## 2012-09-09 DIAGNOSIS — E785 Hyperlipidemia, unspecified: Secondary | ICD-10-CM | POA: Insufficient documentation

## 2012-09-09 DIAGNOSIS — M25569 Pain in unspecified knee: Secondary | ICD-10-CM | POA: Diagnosis not present

## 2012-09-09 DIAGNOSIS — I10 Essential (primary) hypertension: Secondary | ICD-10-CM | POA: Insufficient documentation

## 2012-09-09 DIAGNOSIS — M79609 Pain in unspecified limb: Secondary | ICD-10-CM | POA: Diagnosis not present

## 2012-09-09 DIAGNOSIS — R52 Pain, unspecified: Secondary | ICD-10-CM

## 2012-09-10 ENCOUNTER — Other Ambulatory Visit: Payer: Self-pay | Admitting: Gastroenterology

## 2012-09-10 DIAGNOSIS — R1013 Epigastric pain: Secondary | ICD-10-CM | POA: Diagnosis not present

## 2012-09-10 DIAGNOSIS — K3189 Other diseases of stomach and duodenum: Secondary | ICD-10-CM | POA: Diagnosis not present

## 2012-09-12 ENCOUNTER — Other Ambulatory Visit: Payer: Self-pay | Admitting: Gastroenterology

## 2012-09-12 ENCOUNTER — Ambulatory Visit
Admission: RE | Admit: 2012-09-12 | Discharge: 2012-09-12 | Disposition: A | Discharge status: Home / Self Care | Payer: Medicare Other | Source: Ambulatory Visit | Attending: Gastroenterology | Admitting: Gastroenterology

## 2012-09-12 DIAGNOSIS — R1013 Epigastric pain: Secondary | ICD-10-CM

## 2012-09-12 DIAGNOSIS — K449 Diaphragmatic hernia without obstruction or gangrene: Secondary | ICD-10-CM | POA: Diagnosis not present

## 2012-09-12 DIAGNOSIS — K219 Gastro-esophageal reflux disease without esophagitis: Secondary | ICD-10-CM | POA: Diagnosis not present

## 2012-09-17 ENCOUNTER — Other Ambulatory Visit: Payer: Medicare Other

## 2012-09-26 DIAGNOSIS — M1710 Unilateral primary osteoarthritis, unspecified knee: Secondary | ICD-10-CM | POA: Diagnosis not present

## 2012-10-01 ENCOUNTER — Ambulatory Visit: Payer: Medicare Other | Admitting: Physical Therapy

## 2012-11-18 DIAGNOSIS — Z13820 Encounter for screening for osteoporosis: Secondary | ICD-10-CM | POA: Diagnosis not present

## 2012-11-18 DIAGNOSIS — Z0000 Encounter for general adult medical examination without abnormal findings: Secondary | ICD-10-CM | POA: Diagnosis not present

## 2012-11-18 DIAGNOSIS — G4700 Insomnia, unspecified: Secondary | ICD-10-CM | POA: Diagnosis not present

## 2012-11-18 DIAGNOSIS — N400 Enlarged prostate without lower urinary tract symptoms: Secondary | ICD-10-CM | POA: Diagnosis not present

## 2012-11-18 DIAGNOSIS — Z23 Encounter for immunization: Secondary | ICD-10-CM | POA: Diagnosis not present

## 2012-11-18 DIAGNOSIS — Z1331 Encounter for screening for depression: Secondary | ICD-10-CM | POA: Diagnosis not present

## 2012-11-18 DIAGNOSIS — E782 Mixed hyperlipidemia: Secondary | ICD-10-CM | POA: Diagnosis not present

## 2012-11-20 ENCOUNTER — Other Ambulatory Visit: Payer: Self-pay | Admitting: Gastroenterology

## 2012-11-20 DIAGNOSIS — R109 Unspecified abdominal pain: Secondary | ICD-10-CM

## 2012-11-20 DIAGNOSIS — K5900 Constipation, unspecified: Secondary | ICD-10-CM | POA: Diagnosis not present

## 2012-11-27 ENCOUNTER — Ambulatory Visit
Admission: RE | Admit: 2012-11-27 | Discharge: 2012-11-27 | Disposition: A | Discharge status: Home / Self Care | Payer: Medicare Other | Source: Ambulatory Visit | Attending: Gastroenterology | Admitting: Gastroenterology

## 2012-11-27 DIAGNOSIS — R109 Unspecified abdominal pain: Secondary | ICD-10-CM

## 2012-11-27 DIAGNOSIS — K5900 Constipation, unspecified: Secondary | ICD-10-CM | POA: Diagnosis not present

## 2012-12-20 ENCOUNTER — Ambulatory Visit: Payer: Self-pay | Admitting: Family

## 2012-12-20 ENCOUNTER — Other Ambulatory Visit: Payer: Self-pay

## 2012-12-24 ENCOUNTER — Other Ambulatory Visit: Payer: Medicare Other

## 2012-12-24 ENCOUNTER — Ambulatory Visit: Payer: Medicare Other | Admitting: Neurosurgery

## 2012-12-26 ENCOUNTER — Other Ambulatory Visit: Payer: Self-pay | Admitting: Dermatology

## 2012-12-26 DIAGNOSIS — D485 Neoplasm of uncertain behavior of skin: Secondary | ICD-10-CM | POA: Diagnosis not present

## 2012-12-26 DIAGNOSIS — L570 Actinic keratosis: Secondary | ICD-10-CM | POA: Diagnosis not present

## 2012-12-26 DIAGNOSIS — D044 Carcinoma in situ of skin of scalp and neck: Secondary | ICD-10-CM | POA: Diagnosis not present

## 2012-12-26 DIAGNOSIS — C44319 Basal cell carcinoma of skin of other parts of face: Secondary | ICD-10-CM | POA: Diagnosis not present

## 2012-12-26 DIAGNOSIS — Z85828 Personal history of other malignant neoplasm of skin: Secondary | ICD-10-CM | POA: Diagnosis not present

## 2013-01-16 ENCOUNTER — Encounter: Payer: Self-pay | Admitting: Family

## 2013-01-17 ENCOUNTER — Encounter (INDEPENDENT_AMBULATORY_CARE_PROVIDER_SITE_OTHER): Payer: Self-pay

## 2013-01-17 ENCOUNTER — Ambulatory Visit (HOSPITAL_COMMUNITY)
Admission: RE | Admit: 2013-01-17 | Discharge: 2013-01-17 | Disposition: A | Discharge status: Home / Self Care | Payer: Medicare Other | Source: Ambulatory Visit | Attending: Family | Admitting: Family

## 2013-01-17 ENCOUNTER — Encounter: Payer: Self-pay | Admitting: Family

## 2013-01-17 ENCOUNTER — Ambulatory Visit (INDEPENDENT_AMBULATORY_CARE_PROVIDER_SITE_OTHER): Payer: Medicare Other | Admitting: Family

## 2013-01-17 DIAGNOSIS — I6529 Occlusion and stenosis of unspecified carotid artery: Secondary | ICD-10-CM | POA: Diagnosis not present

## 2013-01-17 DIAGNOSIS — Z48812 Encounter for surgical aftercare following surgery on the circulatory system: Secondary | ICD-10-CM | POA: Insufficient documentation

## 2013-01-17 NOTE — Patient Instructions (Addendum)
Stroke Prevention Some medical conditions and behaviors are associated with an increased chance of having a stroke. You may prevent a stroke by making healthy choices and managing medical conditions. Reduce your risk of having a stroke by:  Staying physically active. Get at least 30 minutes of activity on most or all days.  Not smoking. It may also be helpful to avoid exposure to secondhand smoke.  Limiting alcohol use. Moderate alcohol use is considered to be:  No more than 2 drinks per day for men.  No more than 1 drink per day for nonpregnant women.  Eating healthy foods.  Include 5 or more servings of fruits and vegetables a day.  Certain diets may be prescribed to address high blood pressure, high cholesterol, diabetes, or obesity.  Managing your cholesterol levels.  A low-saturated fat, low-trans fat, low-cholesterol, and high-fiber diet may control cholesterol levels.  Take any prescribed medicines to control cholesterol as directed by your caregiver.  Managing your diabetes.  A controlled-carbohydrate, controlled-sugar diet is recommended to manage diabetes.  Take any prescribed medicines to control diabetes as directed by your caregiver.  Controlling your high blood pressure (hypertension).  A low-salt (sodium), low-saturated fat, low-trans fat, and low-cholesterol diet is recommended to manage high blood pressure.  Take any prescribed medicines to control hypertension as directed by your caregiver.  Maintaining a healthy weight.  A reduced-calorie, low-sodium, low-saturated fat, low-trans fat, low-cholesterol diet is recommended to manage weight.  Stopping drug abuse.  Avoiding birth control pills.  Talk to your caregiver about the risks of taking birth control pills if you are over 61 years old, smoke, get migraines, or have ever had a blood clot.  Getting evaluated for sleep disorders (sleep apnea).  Talk to your caregiver about getting a sleep evaluation  if you snore a lot or have excessive sleepiness.  Taking medicines as directed by your caregiver.  For some people, aspirin or blood thinners (anticoagulants) are helpful in reducing the risk of forming abnormal blood clots that can lead to stroke. If you have the irregular heart rhythm of atrial fibrillation, you should be on a blood thinner unless there is a good reason you cannot take them.  Understand all your medicine instructions. SEEK IMMEDIATE MEDICAL CARE IF:   You have sudden weakness or numbness of the face, arm, or leg, especially on one side of the body.  You have sudden confusion.  You have trouble speaking (aphasia) or understanding.  You have sudden trouble seeing in one or both eyes.  You have sudden trouble walking.  You have dizziness.  You have a loss of balance or coordination.  You have a sudden, severe headache with no known cause.  You have new chest pain or an irregular heartbeat. Any of these symptoms may represent a serious problem that is an emergency. Do not wait to see if the symptoms will go away. Get medical help right away. Call your local emergency services (911 in [place] Do not drive yourself to the hospital. Document Released: 03/02/2004 Document Revised: 04/17/2011 Document Reviewed: 07/26/2012 [facility] Patient Information 2014 [place], [place].   For your abdominal bloating issue, you can try taking probiotics daily, one stable brand is PB8.

## 2013-01-17 NOTE — Progress Notes (Signed)
Established Carotid Patient   History of Present Illness  William Mann is a 79 y.o. male patient of Dr. Hart Rochester who is s/p right  CEA on 03/05/2009. He returns today for carotid artery surveillance.  11 months ago he saw Dr. Hart Rochester for LE pain; lower extremity arterial Dopplers at that time were normal with triphasic flow ABIs of 1.3 bilaterally.  Patient has Negative history of TIA or stroke symptom.  The patient denies amaurosis fugax or monocular blindness.  The patient  denies facial drooping.  Pt. denies hemiplegia.  The patient denies receptive or expressive aphasia.  Pt. denies extremity weakness.   Patient reports New Medical or Surgical History: GI bloating and gas at night, has had evaluation for this. He states it is hard for him to keep his weight up, but denies abdominal pain after eating. His grandson's issues are a source of stress for him.  Pt Diabetic: No Pt smoker: non-smoker  Pt meds include: Statin : Yes ASA: Yes Other anticoagulants/antiplatelets: no   Past Medical History  Diagnosis Date  . Hyperlipidemia   . Hypertension   . Coronary artery disease   . Carotid artery occlusion     Social History History  Substance Use Topics  . Smoking status: Never Smoker   . Smokeless tobacco: Never Used  . Alcohol Use: No    Family History Family History  Problem Relation Age of Onset  . Heart disease Sister   . Cancer Sister   . Hypertension Sister   . Heart attack Sister   . Heart disease Brother   . Stroke Brother   . Hypertension Brother   . Heart disease Brother   . Hyperlipidemia Son     Surgical History Past Surgical History  Procedure Laterality Date  . Carotid endarterectomy  03-05-09    right CEA  . Appendectomy    . Hernia repair    . Lipoma excision    . Colonoscopy  Oct. 2013    one polyp    No Known Allergies  Current Outpatient Prescriptions  Medication Sig Dispense Refill  . aspirin 81 MG tablet Take 160 mg by mouth  daily.      . Coenzyme Q10 (COQ-10) 100 MG CAPS Take by mouth daily.      . fish oil-omega-3 fatty acids 1000 MG capsule Take 1 g by mouth daily.      . Multiple Vitamin (MULTIVITAMIN) tablet Take 1 tablet by mouth daily.      . rosuvastatin (CRESTOR) 5 MG tablet Take 5 mg by mouth daily.      Marland Kitchen zolpidem (AMBIEN) 5 MG tablet Take 5 mg by mouth at bedtime as needed.      . predniSONE (DELTASONE) 10 MG tablet Take 4 tabs by mouth daily x4 days. Then take 3 tabs by mouth daily x5 days. Then take 2 tabs by mouth daily x5 days. Then take 1 tab by mouth daily x3 days. Then take one half tab by mouth daily x2 days. Then stop.  45 tablet  0   No current facility-administered medications for this visit.    Review of Systems : [x]  Positive   [ ]  Denies  General:[ ]  Weight loss,  [ ]  Weight gain, [ ]  Loss of appetite, [ ]  Fever, [ ]  chills  Neurologic: [ ]  Dizziness, [ ]  Blackouts, [ ]  Headaches, [ ]  Seizure [ ]  Stroke, [ ]  "Mini stroke", [ ]  Slurred speech, [ ]  Temporary blindness;  [ ] weakness,  Ear/Nose/Throat: [ ]   Change in hearing, [ ]  Nose bleeds, [ ]  Hoarseness  Vascular:[ ]  Pain in legs with walking, [ ]  Pain in feet while lying flat , [ ]   Non-healing ulcer, [ ]  Blood clot in vein,    Pulmonary: [ ]  Home oxygen, [ ]   Productive cough, [ ]  Bronchitis, [ ]  Coughing up blood,  [ ]  Asthma, [ ]  Wheezing  Musculoskeletal:  [ ]  Arthritis, [ ]  Joint pain, [ ]  low back pain  Cardiac: [ ]  Chest pain, [ ]  Shortness of breath when lying flat, [ ]  Shortness of breath with exertion, [ ]  Palpitations, [ ]  Heart murmur, [ ]   Atrial fibrillation  Hematologic:[ ]  Easy Bruising, [ ]  Anemia; [ ]  Hepatitis  Psychiatric: [ ]   Depression, [ ]  Anxiety   Gastrointestinal: [ ]  Black stool, [ ]  Blood in stool, [ ]  Peptic ulcer disease,  [ ]  Gastroesophageal Reflux, [ ]  Trouble swallowing, [ ]  Diarrhea, [ ]  Constipation  Urinary: [ ]  chronic Kidney disease, [ ]  on HD, [ ]  Burning with urination, [ ]  Frequent  urination, [ ]  Difficulty urinating;   Skin: [ ]  Rashes, [ ]  Wounds    Physical Examination  Filed Vitals:   01/17/13 1059  BP: 140/86  Pulse: 62  Resp: 14   Filed Weights   01/17/13 1059  Weight: 156 lb (70.761 kg)   Body mass index is 22.38 kg/(m^2).   General: WDWN male in NAD GAIT: normal Eyes: PERRLA Pulmonary:  CTAB, Negative  Rales, Negative rhonchi, & Negative wheezing.  Cardiac: regular Rhythm ,  Negative Murmurs.  VASCULAR EXAM Carotid Bruits Left Right   Negative Negative     Radial pulses are 3+ palpable and equal.                                                                                                                            LE Pulses LEFT RIGHT       POPLITEAL  not palpable   not palpable       POSTERIOR TIBIAL   palpable    palpable        DORSALIS PEDIS      ANTERIOR TIBIAL not palpable  not palpable     Gastrointestinal: soft, nontender, BS WNL, no r/g,  negative masses.  Musculoskeletal: Appropriate muscle atrophy/wasting for age. M/S 4/5 throughout, Extremities without ischemic changes. Moderate kyphosis.  Neurologic: A&O X 3; Appropriate Affect ; SENSATION ;normal;  Speech is normal CN 2-12 intact, Pain and light touch intact in extremities, Motor exam as listed above.   Non-Invasive Vascular Imaging CAROTID DUPLEX 01/17/2013   Right ICA: widely patent CEA site. Left ICA: <40% stenosis.  These findings are Unchanged from previous exam.   Assessment: William Mann is a 79 y.o. male who presents with widely patent  Right ICA  And minimal left ICA stenosis. The  ICA stenosis is  Unchanged from previous exam.  Plan: Follow-up in 1 year  with Carotid Duplex scan.   I discussed in depth with the patient the nature of atherosclerosis, and emphasized the importance of maximal medical management including strict control of blood pressure, blood glucose, and lipid levels, obtaining regular exercise, and continued cessation  of smoking.  The patient is aware that without maximal medical management the underlying atherosclerotic disease process will progress, limiting the benefit of any interventions. The patient was given information about stroke prevention and what symptoms should prompt the patient to seek immediate medical care. Thank you for allowing [place] to participate in this patient's care.  Charisse March, RN, MSN, FNP-C [facility] [place] Office: [phone]  Clinic Physician: Imogene Burn  01/17/2013 11:08 AM

## 2013-01-28 ENCOUNTER — Ambulatory Visit: Payer: Self-pay | Admitting: Family

## 2013-01-28 ENCOUNTER — Other Ambulatory Visit (HOSPITAL_COMMUNITY): Payer: Self-pay

## 2013-02-19 ENCOUNTER — Ambulatory Visit: Payer: Medicare Other | Admitting: Interventional Cardiology

## 2013-02-20 ENCOUNTER — Emergency Department (HOSPITAL_COMMUNITY)
Admission: EM | Admit: 2013-02-20 | Discharge: 2013-02-20 | Disposition: A | Discharge status: Home / Self Care | Payer: Medicare Other | Attending: Emergency Medicine | Admitting: Emergency Medicine

## 2013-02-20 ENCOUNTER — Encounter (HOSPITAL_COMMUNITY): Payer: Self-pay | Admitting: Emergency Medicine

## 2013-02-20 DIAGNOSIS — I10 Essential (primary) hypertension: Secondary | ICD-10-CM | POA: Diagnosis not present

## 2013-02-20 DIAGNOSIS — I2510 Atherosclerotic heart disease of native coronary artery without angina pectoris: Secondary | ICD-10-CM | POA: Diagnosis not present

## 2013-02-20 DIAGNOSIS — Z7982 Long term (current) use of aspirin: Secondary | ICD-10-CM | POA: Insufficient documentation

## 2013-02-20 DIAGNOSIS — R51 Headache: Secondary | ICD-10-CM | POA: Insufficient documentation

## 2013-02-20 DIAGNOSIS — K089 Disorder of teeth and supporting structures, unspecified: Secondary | ICD-10-CM | POA: Diagnosis not present

## 2013-02-20 DIAGNOSIS — R519 Headache, unspecified: Secondary | ICD-10-CM

## 2013-02-20 DIAGNOSIS — Z79899 Other long term (current) drug therapy: Secondary | ICD-10-CM | POA: Diagnosis not present

## 2013-02-20 DIAGNOSIS — E785 Hyperlipidemia, unspecified: Secondary | ICD-10-CM | POA: Insufficient documentation

## 2013-02-20 LAB — COMPREHENSIVE METABOLIC PANEL
ALBUMIN: 3.7 g/dL (ref 3.5–5.2)
ALT: 23 U/L (ref 0–53)
AST: 26 U/L (ref 0–37)
Alkaline Phosphatase: 63 U/L (ref 39–117)
BUN: 22 mg/dL (ref 6–23)
CALCIUM: 9.3 mg/dL (ref 8.4–10.5)
CO2: 22 mEq/L (ref 19–32)
CREATININE: 0.84 mg/dL (ref 0.50–1.35)
Chloride: 108 mEq/L (ref 96–112)
GFR calc non Af Amer: 80 mL/min — ABNORMAL LOW (ref >90)
GLUCOSE: 92 mg/dL (ref 70–99)
Potassium: 3.7 mEq/L (ref 3.7–5.3)
Sodium: 144 mEq/L (ref 137–147)
TOTAL PROTEIN: 6.4 g/dL (ref 6.0–8.3)
Total Bilirubin: 0.7 mg/dL (ref 0.3–1.2)

## 2013-02-20 LAB — URINALYSIS, ROUTINE W REFLEX MICROSCOPIC
Bilirubin Urine: NEGATIVE
Glucose, UA: NEGATIVE mg/dL
Hgb urine dipstick: NEGATIVE
KETONES UR: NEGATIVE mg/dL
LEUKOCYTES UA: NEGATIVE
NITRITE: NEGATIVE
PH: 6 (ref 5.0–8.0)
Protein, ur: NEGATIVE mg/dL
SPECIFIC GRAVITY, URINE: 1.019 (ref 1.005–1.030)
Urobilinogen, UA: 1 mg/dL (ref 0.0–1.0)

## 2013-02-20 LAB — CBC WITH DIFFERENTIAL/PLATELET
BASOS PCT: 1 % (ref 0–1)
Basophils Absolute: 0 10*3/uL (ref 0.0–0.1)
EOS ABS: 0.1 10*3/uL (ref 0.0–0.7)
EOS PCT: 2 % (ref 0–5)
HCT: 41.6 % (ref 39.0–52.0)
HEMOGLOBIN: 14.5 g/dL (ref 13.0–17.0)
LYMPHS ABS: 1.5 10*3/uL (ref 0.7–4.0)
Lymphocytes Relative: 25 % (ref 12–46)
MCH: 33.8 pg (ref 26.0–34.0)
MCHC: 34.9 g/dL (ref 30.0–36.0)
MCV: 97 fL (ref 78.0–100.0)
MONOS PCT: 9 % (ref 3–12)
Monocytes Absolute: 0.6 10*3/uL (ref 0.1–1.0)
Neutro Abs: 3.7 10*3/uL (ref 1.7–7.7)
Neutrophils Relative %: 63 % (ref 43–77)
Platelets: 146 10*3/uL — ABNORMAL LOW (ref 150–400)
RBC: 4.29 MIL/uL (ref 4.22–5.81)
RDW: 13.4 % (ref 11.5–15.5)
WBC: 5.9 10*3/uL (ref 4.0–10.5)

## 2013-02-20 MED ORDER — ACETAMINOPHEN 325 MG PO TABS
650.0000 mg | ORAL_TABLET | Freq: Once | ORAL | Status: AC
  Administered 2013-02-20: 650 mg via ORAL
  Filled 2013-02-20: qty 2

## 2013-02-20 NOTE — ED Notes (Signed)
William Mann, grandson, is leaving.  Can be reached at [phone].

## 2013-02-20 NOTE — ED Provider Notes (Signed)
CSN: [id number]     Arrival date & time 02/20/13  0150 History   First MD Initiated Contact with Patient 02/20/13 0230     Chief Complaint  Patient presents with  . Headache   (Consider location/radiation/quality/duration/timing/severity/associated sxs/prior Treatment) HPI Patient presents with frontal right-sided headache radiating down to right jaw. He woke around midnight with this pain. He describes it as mild to moderate. He took his blood pressure 3 times and noted to be elevated. He also took 4 baby aspirin. He states his pain is improved. He has had no visual changes. He's had no focal weakness or numbness. He did have his right lower wisdom tooth extracted roughly one month ago. He's had no fever or chills. He has no swelling in the area. Past Medical History  Diagnosis Date  . Hyperlipidemia   . Hypertension   . Coronary artery disease   . Carotid artery occlusion    Past Surgical History  Procedure Laterality Date  . Carotid endarterectomy  03-05-09    right CEA  . Appendectomy    . Hernia repair    . Lipoma excision    . Colonoscopy  Oct. 2013    one polyp   Family History  Problem Relation Age of Onset  . Heart disease Sister   . Cancer Sister   . Hypertension Sister   . Heart attack Sister   . Heart disease Brother   . Stroke Brother   . Hypertension Brother   . Heart disease Brother   . Hyperlipidemia Son    History  Substance Use Topics  . Smoking status: Never Smoker   . Smokeless tobacco: Never Used  . Alcohol Use: No    Review of Systems  Constitutional: Negative for fever and chills.  HENT: Positive for dental problem. Negative for facial swelling.   Eyes: Negative for visual disturbance.  Respiratory: Negative for cough, shortness of breath and wheezing.   Cardiovascular: Negative for chest pain, palpitations and leg swelling.  Gastrointestinal: Negative for nausea, vomiting and abdominal pain.  Genitourinary: Negative for dysuria and flank  pain.  Musculoskeletal: Negative for back pain, myalgias, neck pain and neck stiffness.  Skin: Negative for rash and wound.  Neurological: Positive for headaches. Negative for dizziness, syncope, weakness, light-headedness and numbness.  All other systems reviewed and are negative.    Allergies  Review of patient's allergies indicates no known allergies.  Home Medications   Current Outpatient Rx  Name  Route  Sig  Dispense  Refill  . aspirin EC 81 MG tablet   Oral   Take 81 mg by mouth daily.         Marland Kitchen aspirin EC 81 MG tablet   Oral   Take 324 mg by mouth once.         . Cholecalciferol (VITAMIN D-3 PO)   Oral   Take 1 tablet by mouth daily.         . Coenzyme Q10 (COQ-10) 100 MG CAPS   Oral   Take by mouth daily.         . Multiple Vitamin (MULTIVITAMIN) tablet   Oral   Take 1 tablet by mouth daily.         . rosuvastatin (CRESTOR) 5 MG tablet   Oral   Take 5 mg by mouth daily.         Marland Kitchen zolpidem (AMBIEN) 5 MG tablet   Oral   Take 5 mg by mouth at bedtime as needed.  BP 155/87  Pulse 73  Temp(Src) 97.5 F (36.4 C) (Oral)  Resp 19  Ht 5\' 11"  (1.803 m)  Wt 155 lb (70.308 kg)  BMI 21.63 kg/m2  SpO2 95% Physical Exam  Nursing note and vitals reviewed. Constitutional: He is oriented to person, place, and time. He appears well-developed and well-nourished. No distress.  HENT:  Head: Normocephalic and atraumatic.  Mouth/Throat: Oropharynx is clear and moist.  Area previous extraction in the right lower jaw without evidence of erythema, swelling or abscess. No sinus tenderness to percussion.  Eyes: EOM are normal. Pupils are equal, round, and reactive to light.  Neck: Normal range of motion. Neck supple.  No nuchal rigidity  Cardiovascular: Normal rate and regular rhythm.   Pulmonary/Chest: Effort normal and breath sounds normal. No respiratory distress. He has no wheezes. He has no rales. He exhibits no tenderness.  Abdominal: Soft.  Bowel sounds are normal. He exhibits no distension and no mass. There is no tenderness. There is no rebound and no guarding.  Musculoskeletal: Normal range of motion. He exhibits no edema and no tenderness.  Neurological: He is alert and oriented to person, place, and time.  Patient is alert and oriented x3 with clear, goal oriented speech. Patient has 5/5 motor in all extremities. Sensation is intact to light touch. Bilateral finger-to-nose is normal with no signs of dysmetria.   Skin: Skin is warm and dry. No rash noted. No erythema.  Psychiatric: He has a normal mood and affect. His behavior is normal.    ED Course  Procedures (including critical care time) Labs Review Labs Reviewed  CBC WITH DIFFERENTIAL - Abnormal; Notable for the following:    Platelets 146 (*)    All other components within normal limits  COMPREHENSIVE METABOLIC PANEL - Abnormal; Notable for the following:    GFR calc non Af Amer 80 (*)    All other components within normal limits  URINALYSIS, ROUTINE W REFLEX MICROSCOPIC   Imaging Review No results found.  EKG Interpretation   None       Date: 02/20/2013  Rate: 64  Rhythm: normal sinus rhythm  QRS Axis: normal  Intervals: normal  ST/T Wave abnormalities: normal  Conduction Disutrbances:none  Narrative Interpretation:   Old EKG Reviewed: none available   MDM  Patient's blood pressures improved in the emergency department without treatment. He states his headache and facial pain is improved significantly. I have a low suspicion for emergent medical condition such as subarachnoid hemorrhage. He has no thunderclap-type signs. The pain seems to be all facial he related. Do not see any evidence of infection he has a normal white blood cell count.  Patient with mild right facial pain no improved. His vital signs have stabilized in the emergency department. He has a normal workup including a normal white blood cell count. He is neurologically stable. The  patient can safely be discharged to follow with his primary Dr. Return precautions have been given and patient voiced understanding.  Julianne Rice, MD 02/20/13 [phone]

## 2013-02-20 NOTE — ED Notes (Addendum)
Went to bed at 2100, woke at 0000 and checked BP due to pain in right eye and lightly across forehead.  BP was high so came to ED.  Took 4- 81mg  ASA approx one hr ago. Reports he had wisdom tooth pulled lower right jaw approx one month ago.  Was on atb and naproxen following extraction; finished on 1/3.

## 2013-02-20 NOTE — Discharge Instructions (Signed)

## 2013-04-18 ENCOUNTER — Ambulatory Visit
Admission: RE | Admit: 2013-04-18 | Discharge: 2013-04-18 | Disposition: A | Discharge status: Home / Self Care | Payer: Medicare Other | Source: Ambulatory Visit | Attending: Gastroenterology | Admitting: Gastroenterology

## 2013-04-18 ENCOUNTER — Other Ambulatory Visit: Payer: Self-pay | Admitting: Gastroenterology

## 2013-04-18 DIAGNOSIS — R141 Gas pain: Secondary | ICD-10-CM | POA: Diagnosis not present

## 2013-04-18 DIAGNOSIS — R143 Flatulence: Principal | ICD-10-CM

## 2013-04-18 DIAGNOSIS — R142 Eructation: Principal | ICD-10-CM

## 2013-04-24 DIAGNOSIS — R143 Flatulence: Secondary | ICD-10-CM | POA: Diagnosis not present

## 2013-04-24 DIAGNOSIS — R141 Gas pain: Secondary | ICD-10-CM | POA: Diagnosis not present

## 2013-04-24 DIAGNOSIS — K5900 Constipation, unspecified: Secondary | ICD-10-CM | POA: Diagnosis not present

## 2013-05-29 ENCOUNTER — Other Ambulatory Visit: Payer: Self-pay | Admitting: Gastroenterology

## 2013-05-29 DIAGNOSIS — R143 Flatulence: Secondary | ICD-10-CM | POA: Diagnosis not present

## 2013-05-29 DIAGNOSIS — R109 Unspecified abdominal pain: Secondary | ICD-10-CM

## 2013-05-29 DIAGNOSIS — R141 Gas pain: Secondary | ICD-10-CM | POA: Diagnosis not present

## 2013-06-03 ENCOUNTER — Ambulatory Visit
Admission: RE | Admit: 2013-06-03 | Discharge: 2013-06-03 | Disposition: A | Discharge status: Home / Self Care | Payer: Medicare Other | Source: Ambulatory Visit | Attending: Gastroenterology | Admitting: Gastroenterology

## 2013-06-03 DIAGNOSIS — R109 Unspecified abdominal pain: Secondary | ICD-10-CM

## 2013-06-03 DIAGNOSIS — K5730 Diverticulosis of large intestine without perforation or abscess without bleeding: Secondary | ICD-10-CM | POA: Diagnosis not present

## 2013-06-03 DIAGNOSIS — N400 Enlarged prostate without lower urinary tract symptoms: Secondary | ICD-10-CM | POA: Diagnosis not present

## 2013-06-03 MED ORDER — IOHEXOL 300 MG/ML  SOLN
100.0000 mL | Freq: Once | INTRAMUSCULAR | Status: AC | PRN
Start: 2013-06-03 — End: 2013-06-03
  Administered 2013-06-03: 100 mL via INTRAVENOUS

## 2013-06-11 ENCOUNTER — Other Ambulatory Visit: Payer: Self-pay | Admitting: Dermatology

## 2013-06-11 DIAGNOSIS — C44519 Basal cell carcinoma of skin of other part of trunk: Secondary | ICD-10-CM | POA: Diagnosis not present

## 2013-06-11 DIAGNOSIS — D485 Neoplasm of uncertain behavior of skin: Secondary | ICD-10-CM | POA: Diagnosis not present

## 2013-06-11 DIAGNOSIS — L570 Actinic keratosis: Secondary | ICD-10-CM | POA: Diagnosis not present

## 2013-06-11 DIAGNOSIS — Z85828 Personal history of other malignant neoplasm of skin: Secondary | ICD-10-CM | POA: Diagnosis not present

## 2013-06-11 DIAGNOSIS — L820 Inflamed seborrheic keratosis: Secondary | ICD-10-CM | POA: Diagnosis not present

## 2013-06-16 DIAGNOSIS — H2510 Age-related nuclear cataract, unspecified eye: Secondary | ICD-10-CM | POA: Diagnosis not present

## 2013-08-27 ENCOUNTER — Other Ambulatory Visit: Payer: Self-pay | Admitting: Family Medicine

## 2013-08-27 DIAGNOSIS — R911 Solitary pulmonary nodule: Secondary | ICD-10-CM

## 2013-09-03 ENCOUNTER — Ambulatory Visit
Admission: RE | Admit: 2013-09-03 | Discharge: 2013-09-03 | Disposition: A | Discharge status: Home / Self Care | Payer: Medicare Other | Source: Ambulatory Visit | Attending: Family Medicine | Admitting: Family Medicine

## 2013-09-03 DIAGNOSIS — J438 Other emphysema: Secondary | ICD-10-CM | POA: Diagnosis not present

## 2013-09-03 DIAGNOSIS — R911 Solitary pulmonary nodule: Secondary | ICD-10-CM

## 2013-09-03 DIAGNOSIS — J8410 Pulmonary fibrosis, unspecified: Secondary | ICD-10-CM | POA: Diagnosis not present

## 2013-09-03 MED ORDER — IOHEXOL 300 MG/ML  SOLN
75.0000 mL | Freq: Once | INTRAMUSCULAR | Status: AC | PRN
  Administered 2013-09-03: 75 mL via INTRAVENOUS

## 2013-09-08 ENCOUNTER — Other Ambulatory Visit: Payer: Self-pay | Admitting: Family Medicine

## 2013-09-08 DIAGNOSIS — E041 Nontoxic single thyroid nodule: Secondary | ICD-10-CM

## 2013-09-09 ENCOUNTER — Ambulatory Visit
Admission: RE | Admit: 2013-09-09 | Discharge: 2013-09-09 | Disposition: A | Discharge status: Home / Self Care | Payer: Medicare Other | Source: Ambulatory Visit | Attending: Family Medicine | Admitting: Family Medicine

## 2013-09-09 DIAGNOSIS — E041 Nontoxic single thyroid nodule: Secondary | ICD-10-CM

## 2013-09-22 DIAGNOSIS — E041 Nontoxic single thyroid nodule: Secondary | ICD-10-CM | POA: Diagnosis not present

## 2013-10-03 ENCOUNTER — Ambulatory Visit: Payer: Medicare Other | Admitting: Internal Medicine

## 2013-10-14 ENCOUNTER — Encounter: Payer: Self-pay | Admitting: Internal Medicine

## 2013-11-04 DIAGNOSIS — R141 Gas pain: Secondary | ICD-10-CM | POA: Diagnosis not present

## 2013-11-04 DIAGNOSIS — K5900 Constipation, unspecified: Secondary | ICD-10-CM | POA: Diagnosis not present

## 2013-11-20 DIAGNOSIS — N400 Enlarged prostate without lower urinary tract symptoms: Secondary | ICD-10-CM | POA: Diagnosis not present

## 2013-11-20 DIAGNOSIS — Z0000 Encounter for general adult medical examination without abnormal findings: Secondary | ICD-10-CM | POA: Diagnosis not present

## 2013-11-20 DIAGNOSIS — E782 Mixed hyperlipidemia: Secondary | ICD-10-CM | POA: Diagnosis not present

## 2013-11-20 DIAGNOSIS — Z23 Encounter for immunization: Secondary | ICD-10-CM | POA: Diagnosis not present

## 2013-11-20 DIAGNOSIS — G4700 Insomnia, unspecified: Secondary | ICD-10-CM | POA: Diagnosis not present

## 2013-11-20 DIAGNOSIS — Z1389 Encounter for screening for other disorder: Secondary | ICD-10-CM | POA: Diagnosis not present

## 2013-12-21 ENCOUNTER — Encounter: Payer: Self-pay | Admitting: *Deleted

## 2013-12-23 DIAGNOSIS — K5900 Constipation, unspecified: Secondary | ICD-10-CM | POA: Diagnosis not present

## 2014-02-10 ENCOUNTER — Ambulatory Visit: Payer: Medicare Other | Admitting: Family

## 2014-02-10 ENCOUNTER — Other Ambulatory Visit (HOSPITAL_COMMUNITY): Payer: Medicare Other

## 2014-02-16 ENCOUNTER — Encounter: Payer: Self-pay | Admitting: Family

## 2014-02-17 ENCOUNTER — Ambulatory Visit (INDEPENDENT_AMBULATORY_CARE_PROVIDER_SITE_OTHER): Payer: Medicare Other | Admitting: Family

## 2014-02-17 ENCOUNTER — Encounter: Payer: Self-pay | Admitting: Family

## 2014-02-17 ENCOUNTER — Ambulatory Visit (HOSPITAL_COMMUNITY)
Admission: RE | Admit: 2014-02-17 | Discharge: 2014-02-17 | Disposition: A | Discharge status: Home / Self Care | Payer: Medicare Other | Source: Ambulatory Visit | Attending: Family | Admitting: Family

## 2014-02-17 VITALS — BP 163/100 | HR 80 | Temp 97.2°F | Resp 16 | Ht 70.0 in | Wt 156.0 lb

## 2014-02-17 DIAGNOSIS — I6523 Occlusion and stenosis of bilateral carotid arteries: Secondary | ICD-10-CM | POA: Insufficient documentation

## 2014-02-17 DIAGNOSIS — Z9889 Other specified postprocedural states: Secondary | ICD-10-CM

## 2014-02-17 DIAGNOSIS — I6522 Occlusion and stenosis of left carotid artery: Secondary | ICD-10-CM | POA: Diagnosis not present

## 2014-02-17 DIAGNOSIS — Z48812 Encounter for surgical aftercare following surgery on the circulatory system: Secondary | ICD-10-CM | POA: Diagnosis not present

## 2014-02-17 NOTE — Progress Notes (Signed)
Established Carotid Patient   History of Present Illness  William Mann is a 81 y.o. male patient of Dr. Kellie Simmering who is s/p right CEA on 03/05/2009. He returns today for carotid artery surveillance.  In January 2014 he saw Dr. Kellie Simmering for LE pain; lower extremity arterial Dopplers at that time were normal with triphasic flow ABIs of 1.3 bilaterally.  Patient has Negative history of TIA or stroke symptom. The patient denies amaurosis fugax or monocular blindness. The patient denies facial drooping. Pt. denies hemiplegia. The patient denies receptive or expressive aphasia. Pt. denies extremity weakness.  Patient reports New Medical or Surgical History: GI bloating and gas at night, has had evaluation for this. He states it is hard for him to keep his weight up, but denies abdominal pain after eating. His grandson's issues are a source of stress for him.  Pt Diabetic: No Pt smoker: non-smoker  Pt meds include: Statin : Yes ASA: Yes Other anticoagulants/antiplatelets: no                                                                                                                                      Past Medical History  Diagnosis Date  . Hyperlipidemia   . Hypertension   . Coronary artery disease   . Carotid artery occlusion     Social History History  Substance Use Topics  . Smoking status: Never Smoker   . Smokeless tobacco: Never Used  . Alcohol Use: No    Family History Family History  Problem Relation Age of Onset  . Heart disease Sister   . Cancer Sister   . Hypertension Sister   . Heart attack Sister   . Heart disease Brother   . Stroke Brother   . Hypertension Brother   . Heart disease Brother   . Hyperlipidemia Son     Surgical History Past Surgical History  Procedure Laterality Date  . Carotid endarterectomy  03-05-09    right CEA  . Appendectomy    . Hernia repair    . Lipoma excision    . Colonoscopy  Oct. 2013    one polyp     No Known Allergies  Current Outpatient Prescriptions  Medication Sig Dispense Refill  . aspirin EC 81 MG tablet Take 324 mg by mouth once.    . Cholecalciferol (VITAMIN D-3 PO) Take 1 tablet by mouth daily.    . Coenzyme Q10 (COQ-10) 100 MG CAPS Take by mouth daily.    . Multiple Vitamin (MULTIVITAMIN) tablet Take 1 tablet by mouth daily.    . rosuvastatin (CRESTOR) 5 MG tablet Take 5 mg by mouth daily.    Marland Kitchen zolpidem (AMBIEN) 5 MG tablet Take 5 mg by mouth at bedtime as needed.     No current facility-administered medications for this visit.    Review of Systems : See HPI for pertinent positives and negatives.  Physical Examination  Filed  Vitals:   02/17/14 1357 02/17/14 1400  BP: 146/100 163/100  Pulse: 78 80  Temp: 97.2 F (36.2 C)   TempSrc: Oral   Resp: 16   Height: 5\' 10"  (1.778 m)   Weight: 156 lb (70.761 kg)   SpO2: 99%    Body mass index is 22.38 kg/(m^2).  General: WDWN male in NAD GAIT: normal Eyes: PERRLA Pulmonary: CTAB, Negative Rales, Negative rhonchi, & Negative wheezing.  Cardiac: regular Rhythm , Negative Murmurs.  VASCULAR EXAM Carotid Bruits Left Right   Negative Negative    Radial pulses are 2+ palpable and equal.      LE Pulses LEFT RIGHT   POPLITEAL not palpable  not palpable   POSTERIOR TIBIAL  palpable   palpable    DORSALIS PEDIS  ANTERIOR TIBIAL  palpable   palpable     Gastrointestinal: soft, nontender, BS WNL, no r/g, no palpable masses.  Musculoskeletal: Appropriate muscle atrophy/wasting for age. M/S 4/5 throughout, Extremities without ischemic changes. Moderate kyphosis.  Neurologic: A&O X 3; Appropriate Affect ; SENSATION ;normal;  Speech is normal CN 2-12 intact, Pain and light touch intact in extremities, Motor exam as listed  above.    Non-Invasive Vascular Imaging CAROTID DUPLEX 02/17/2014   CEREBROVASCULAR DUPLEX EVALUATION    INDICATION: Carotid endarterectomy     PREVIOUS INTERVENTION(S): Right carotid endarterectomy on 03/05/09    DUPLEX EXAM:     RIGHT  LEFT  Peak Systolic Velocities (cm/s) End Diastolic Velocities (cm/s) Plaque LOCATION Peak Systolic Velocities (cm/s) End Diastolic Velocities (cm/s) Plaque  73 12  CCA PROXIMAL 79 18   85 15  CCA MID 80 20 HT  73 17 HM CCA DISTAL 69 16 HT  68 9  ECA 76 11   45 10  ICA PROXIMAL 91 19 HT  45 14  ICA MID 56 19   55 19  ICA DISTAL 42 14     Not Calculated ICA / CCA Ratio (PSV) 1.3  Antegrade Vertebral Flow Antegrade  099 Brachial Systolic Pressure (mmHg) 833  Multiphasic (subclavian artery) Brachial Artery Waveforms Multiphasic (subclavian artery)    Plaque Morphology:  HM = Homogeneous, HT = Heterogeneous, CP = Calcific Plaque, SP = Smooth Plaque, IP = Irregular Plaque     ADDITIONAL FINDINGS: No significant stenosis of the bilateral external or common carotid arteries.    IMPRESSION: 1. Patent right carotid endarterectomy site with no right internal carotid artery stenosis. 2. Doppler velocities suggest a less than 40% stenosis of the left proximal internal carotid artery.    Compared to the previous exam:  No significant change noted when compared to the previous exam on 01/17/13.        Assessment: William Mann is a 81 y.o. male who is s/p right CEA on 03/05/2009. He presents with asymptomatic patent right carotid endarterectomy site with no right internal carotid artery stenosis and less than 40% stenosis of the left proximal ICA. No significant change noted when compared to the previous exam on 01/17/13.  Plan: Follow-up in 1 year with Carotid Duplex.   I discussed in depth with the patient the nature of atherosclerosis, and emphasized the importance of maximal medical management including strict control of blood pressure,  blood glucose, and lipid levels, obtaining regular exercise, and continued cessation of smoking.  The patient is aware that without maximal medical management the underlying atherosclerotic disease process will progress, limiting the benefit of any interventions. The patient was given information about stroke prevention and what symptoms  should prompt the patient to seek immediate medical care. Thank you for allowing [place] to participate in this patient's care.  Clemon Chambers, RN, MSN, FNP-C [facility] Office: [phone]  Clinic Physician: Kellie Simmering  02/17/2014 2:11 PM

## 2014-02-17 NOTE — Patient Instructions (Signed)
Stroke Prevention Some medical conditions and behaviors are associated with an increased chance of having a stroke. You may prevent a stroke by making healthy choices and managing medical conditions. HOW CAN I REDUCE MY RISK OF HAVING A STROKE?   Stay physically active. Get at least 30 minutes of activity on most or all days.  Do not smoke. It may also be helpful to avoid exposure to secondhand smoke.  Limit alcohol use. Moderate alcohol use is considered to be:  No more than 2 drinks per day for men.  No more than 1 drink per day for nonpregnant women.  Eat healthy foods. This involves:  Eating 5 or more servings of fruits and vegetables a day.  Making dietary changes that address high blood pressure (hypertension), high cholesterol, diabetes, or obesity.  Manage your cholesterol levels.  Making food choices that are high in fiber and low in saturated fat, trans fat, and cholesterol may control cholesterol levels.  Take any prescribed medicines to control cholesterol as directed by your health care provider.  Manage your diabetes.  Controlling your carbohydrate and sugar intake is recommended to manage diabetes.  Take any prescribed medicines to control diabetes as directed by your health care provider.  Control your hypertension.  Making food choices that are low in salt (sodium), saturated fat, trans fat, and cholesterol is recommended to manage hypertension.  Take any prescribed medicines to control hypertension as directed by your health care provider.  Maintain a healthy weight.  Reducing calorie intake and making food choices that are low in sodium, saturated fat, trans fat, and cholesterol are recommended to manage weight.  Stop drug abuse.  Avoid taking birth control pills.  Talk to your health care provider about the risks of taking birth control pills if you are over 35 years old, smoke, get migraines, or have ever had a blood clot.  Get evaluated for sleep  disorders (sleep apnea).  Talk to your health care provider about getting a sleep evaluation if you snore a lot or have excessive sleepiness.  Take medicines only as directed by your health care provider.  For some people, aspirin or blood thinners (anticoagulants) are helpful in reducing the risk of forming abnormal blood clots that can lead to stroke. If you have the irregular heart rhythm of atrial fibrillation, you should be on a blood thinner unless there is a good reason you cannot take them.  Understand all your medicine instructions.  Make sure that other conditions (such as anemia or atherosclerosis) are addressed. SEEK IMMEDIATE MEDICAL CARE IF:   You have sudden weakness or numbness of the face, arm, or leg, especially on one side of the body.  Your face or eyelid droops to one side.  You have sudden confusion.  You have trouble speaking (aphasia) or understanding.  You have sudden trouble seeing in one or both eyes.  You have sudden trouble walking.  You have dizziness.  You have a loss of balance or coordination.  You have a sudden, severe headache with no known cause.  You have new chest pain or an irregular heartbeat. Any of these symptoms may represent a serious problem that is an emergency. Do not wait to see if the symptoms will go away. Get medical help at once. Call your local emergency services (911 in [place] Do not drive yourself to the hospital. Document Released: 03/02/2004 Document Revised: 06/09/2013 Document Reviewed: 07/26/2012 Exit[employer] Patient Information 2015 [employer]. This information is not intended to replace advice given   to you by your health care provider. Make sure you discuss any questions you have with your health care provider.  

## 2014-02-18 NOTE — Addendum Note (Signed)
Addended by: Mena Goes on: 02/18/2014 04:05 PM   Modules accepted: Orders

## 2014-02-25 ENCOUNTER — Other Ambulatory Visit: Payer: Self-pay | Admitting: Dermatology

## 2014-02-25 DIAGNOSIS — D045 Carcinoma in situ of skin of trunk: Secondary | ICD-10-CM | POA: Diagnosis not present

## 2014-02-25 DIAGNOSIS — L821 Other seborrheic keratosis: Secondary | ICD-10-CM | POA: Diagnosis not present

## 2014-02-25 DIAGNOSIS — D485 Neoplasm of uncertain behavior of skin: Secondary | ICD-10-CM | POA: Diagnosis not present

## 2014-02-25 DIAGNOSIS — Z85828 Personal history of other malignant neoplasm of skin: Secondary | ICD-10-CM | POA: Diagnosis not present

## 2014-02-25 DIAGNOSIS — L570 Actinic keratosis: Secondary | ICD-10-CM | POA: Diagnosis not present

## 2014-03-06 ENCOUNTER — Other Ambulatory Visit: Payer: Self-pay | Admitting: Family Medicine

## 2014-03-06 DIAGNOSIS — R911 Solitary pulmonary nodule: Secondary | ICD-10-CM

## 2014-03-09 ENCOUNTER — Other Ambulatory Visit: Payer: Self-pay | Admitting: Internal Medicine

## 2014-03-09 DIAGNOSIS — E041 Nontoxic single thyroid nodule: Secondary | ICD-10-CM

## 2014-03-11 ENCOUNTER — Ambulatory Visit
Admission: RE | Admit: 2014-03-11 | Discharge: 2014-03-11 | Disposition: A | Discharge status: Home / Self Care | Payer: Medicare Other | Source: Ambulatory Visit | Attending: Family Medicine | Admitting: Family Medicine

## 2014-03-11 ENCOUNTER — Ambulatory Visit
Admission: RE | Admit: 2014-03-11 | Discharge: 2014-03-11 | Disposition: A | Discharge status: Home / Self Care | Payer: Medicare Other | Source: Ambulatory Visit | Attending: Internal Medicine | Admitting: Internal Medicine

## 2014-03-11 DIAGNOSIS — E041 Nontoxic single thyroid nodule: Secondary | ICD-10-CM

## 2014-03-11 DIAGNOSIS — J984 Other disorders of lung: Secondary | ICD-10-CM | POA: Diagnosis not present

## 2014-03-11 DIAGNOSIS — K449 Diaphragmatic hernia without obstruction or gangrene: Secondary | ICD-10-CM | POA: Diagnosis not present

## 2014-03-11 DIAGNOSIS — I2510 Atherosclerotic heart disease of native coronary artery without angina pectoris: Secondary | ICD-10-CM | POA: Diagnosis not present

## 2014-03-11 DIAGNOSIS — J439 Emphysema, unspecified: Secondary | ICD-10-CM | POA: Diagnosis not present

## 2014-03-11 DIAGNOSIS — R911 Solitary pulmonary nodule: Secondary | ICD-10-CM

## 2014-03-24 DIAGNOSIS — R141 Gas pain: Secondary | ICD-10-CM | POA: Diagnosis not present

## 2014-03-24 DIAGNOSIS — R140 Abdominal distension (gaseous): Secondary | ICD-10-CM | POA: Diagnosis not present

## 2014-03-31 ENCOUNTER — Other Ambulatory Visit: Payer: Self-pay | Admitting: Internal Medicine

## 2014-03-31 DIAGNOSIS — E041 Nontoxic single thyroid nodule: Secondary | ICD-10-CM | POA: Diagnosis not present

## 2014-04-01 ENCOUNTER — Other Ambulatory Visit (HOSPITAL_COMMUNITY)
Admission: RE | Admit: 2014-04-01 | Discharge: 2014-04-01 | Disposition: A | Discharge status: Home / Self Care | Payer: Medicare Other | Source: Ambulatory Visit | Attending: Diagnostic Radiology | Admitting: Diagnostic Radiology

## 2014-04-01 ENCOUNTER — Ambulatory Visit
Admission: RE | Admit: 2014-04-01 | Discharge: 2014-04-01 | Disposition: A | Discharge status: Home / Self Care | Payer: Medicare Other | Source: Ambulatory Visit | Attending: Internal Medicine | Admitting: Internal Medicine

## 2014-04-01 DIAGNOSIS — E041 Nontoxic single thyroid nodule: Secondary | ICD-10-CM | POA: Insufficient documentation

## 2014-05-25 DIAGNOSIS — I739 Peripheral vascular disease, unspecified: Secondary | ICD-10-CM | POA: Diagnosis not present

## 2014-05-25 DIAGNOSIS — N400 Enlarged prostate without lower urinary tract symptoms: Secondary | ICD-10-CM | POA: Diagnosis not present

## 2014-05-25 DIAGNOSIS — D696 Thrombocytopenia, unspecified: Secondary | ICD-10-CM | POA: Diagnosis not present

## 2014-05-25 DIAGNOSIS — G4700 Insomnia, unspecified: Secondary | ICD-10-CM | POA: Diagnosis not present

## 2014-05-25 DIAGNOSIS — E782 Mixed hyperlipidemia: Secondary | ICD-10-CM | POA: Diagnosis not present

## 2014-06-04 ENCOUNTER — Ambulatory Visit
Admission: RE | Admit: 2014-06-04 | Discharge: 2014-06-04 | Disposition: A | Discharge status: Home / Self Care | Payer: Medicare Other | Source: Ambulatory Visit | Attending: Family Medicine | Admitting: Family Medicine

## 2014-06-04 ENCOUNTER — Other Ambulatory Visit: Payer: Self-pay | Admitting: Family Medicine

## 2014-06-04 DIAGNOSIS — I739 Peripheral vascular disease, unspecified: Secondary | ICD-10-CM | POA: Diagnosis not present

## 2014-06-04 DIAGNOSIS — H532 Diplopia: Secondary | ICD-10-CM | POA: Diagnosis not present

## 2014-06-04 DIAGNOSIS — I6782 Cerebral ischemia: Secondary | ICD-10-CM | POA: Diagnosis not present

## 2014-06-04 DIAGNOSIS — E782 Mixed hyperlipidemia: Secondary | ICD-10-CM | POA: Diagnosis not present

## 2014-06-04 DIAGNOSIS — I639 Cerebral infarction, unspecified: Secondary | ICD-10-CM | POA: Diagnosis not present

## 2014-06-10 DIAGNOSIS — G453 Amaurosis fugax: Secondary | ICD-10-CM | POA: Diagnosis not present

## 2014-08-06 ENCOUNTER — Encounter (HOSPITAL_BASED_OUTPATIENT_CLINIC_OR_DEPARTMENT_OTHER): Payer: Self-pay | Admitting: *Deleted

## 2014-08-06 ENCOUNTER — Emergency Department (HOSPITAL_BASED_OUTPATIENT_CLINIC_OR_DEPARTMENT_OTHER)
Admission: EM | Admit: 2014-08-06 | Discharge: 2014-08-06 | Disposition: A | Discharge status: Home / Self Care | Payer: Medicare Other | Attending: Emergency Medicine | Admitting: Emergency Medicine

## 2014-08-06 ENCOUNTER — Emergency Department (HOSPITAL_BASED_OUTPATIENT_CLINIC_OR_DEPARTMENT_OTHER): Payer: Medicare Other

## 2014-08-06 DIAGNOSIS — R2240 Localized swelling, mass and lump, unspecified lower limb: Secondary | ICD-10-CM | POA: Insufficient documentation

## 2014-08-06 DIAGNOSIS — G4700 Insomnia, unspecified: Secondary | ICD-10-CM | POA: Diagnosis not present

## 2014-08-06 DIAGNOSIS — R531 Weakness: Secondary | ICD-10-CM | POA: Insufficient documentation

## 2014-08-06 DIAGNOSIS — R0789 Other chest pain: Secondary | ICD-10-CM | POA: Diagnosis not present

## 2014-08-06 DIAGNOSIS — Z79899 Other long term (current) drug therapy: Secondary | ICD-10-CM | POA: Diagnosis not present

## 2014-08-06 DIAGNOSIS — Z72820 Sleep deprivation: Secondary | ICD-10-CM

## 2014-08-06 DIAGNOSIS — J449 Chronic obstructive pulmonary disease, unspecified: Secondary | ICD-10-CM | POA: Diagnosis not present

## 2014-08-06 DIAGNOSIS — R0602 Shortness of breath: Secondary | ICD-10-CM

## 2014-08-06 DIAGNOSIS — R109 Unspecified abdominal pain: Secondary | ICD-10-CM | POA: Insufficient documentation

## 2014-08-06 DIAGNOSIS — I10 Essential (primary) hypertension: Secondary | ICD-10-CM | POA: Diagnosis not present

## 2014-08-06 DIAGNOSIS — R140 Abdominal distension (gaseous): Secondary | ICD-10-CM | POA: Diagnosis not present

## 2014-08-06 DIAGNOSIS — R5383 Other fatigue: Secondary | ICD-10-CM | POA: Insufficient documentation

## 2014-08-06 DIAGNOSIS — Z7982 Long term (current) use of aspirin: Secondary | ICD-10-CM | POA: Insufficient documentation

## 2014-08-06 DIAGNOSIS — R079 Chest pain, unspecified: Secondary | ICD-10-CM | POA: Diagnosis not present

## 2014-08-06 DIAGNOSIS — E785 Hyperlipidemia, unspecified: Secondary | ICD-10-CM | POA: Insufficient documentation

## 2014-08-06 LAB — BASIC METABOLIC PANEL
Anion gap: 10 (ref 5–15)
BUN: 22 mg/dL — ABNORMAL HIGH (ref 6–20)
CO2: 25 mmol/L (ref 22–32)
Calcium: 8.9 mg/dL (ref 8.9–10.3)
Chloride: 106 mmol/L (ref 101–111)
Creatinine, Ser: 1.16 mg/dL (ref 0.61–1.24)
GFR, EST NON AFRICAN AMERICAN: 56 mL/min — AB (ref >60)
Glucose, Bld: 112 mg/dL — ABNORMAL HIGH (ref 65–99)
Potassium: 4 mmol/L (ref 3.5–5.1)
Sodium: 141 mmol/L (ref 135–145)

## 2014-08-06 LAB — CBC
HCT: 42.2 % (ref 39.0–52.0)
Hemoglobin: 14.4 g/dL (ref 13.0–17.0)
MCH: 33.8 pg (ref 26.0–34.0)
MCHC: 34.1 g/dL (ref 30.0–36.0)
MCV: 99.1 fL (ref 78.0–100.0)
Platelets: 149 10*3/uL — ABNORMAL LOW (ref 150–400)
RBC: 4.26 MIL/uL (ref 4.22–5.81)
RDW: 13.1 % (ref 11.5–15.5)
WBC: 6.1 10*3/uL (ref 4.0–10.5)

## 2014-08-06 LAB — BRAIN NATRIURETIC PEPTIDE: B Natriuretic Peptide: 40.7 pg/mL (ref 0.0–100.0)

## 2014-08-06 LAB — TROPONIN I

## 2014-08-06 NOTE — ED Provider Notes (Signed)
CSN: [id number]     Arrival date & time 08/06/14  1419 History   First MD Initiated Contact with Patient 08/06/14 1441     Chief Complaint  Patient presents with  . Shortness of Breath     (Consider location/radiation/quality/duration/timing/severity/associated sxs/prior Treatment) HPI Comments: 81 year old male past medical history of hyperlipidemia, hypertension and CAD presenting with multiple complaints. Patient reports shortness of breath when standing 1 week. States he can "occasionally hear a wheeze". Admits to associated "moving chest pain" that will be in one area of his chest, a few minutes later in the next area of his chest, and come and go at random. No specific aggravating or alleviating factors. He is feeling weak and tired. Also complaining of abdominal bloating that has been ongoing "for a while". He has been seen by gastroenterology for the same in the past and "was not given any advice". He used an enema last night with a successful bowel movement which relieves his bloating. Also states over the past year, his ankles have been "stinging" after eating only. He cannot pinpoint any specific food that makes them sting, and states occasionally they swell at night. The stinging sensation sometimes keeps him awake at night. States he tends to sleep 2-3 hours, unless he takes zolpidem and is able to sleep for about 4-6 hours. His wife is stating that she feels the patient needs an aunt had to present.  Patient is a 81 y.o. male presenting with shortness of breath. The history is provided by the patient and the spouse.  Shortness of Breath Associated symptoms: abdominal pain and chest pain     Past Medical History  Diagnosis Date  . Hyperlipidemia   . Hypertension   . Coronary artery disease   . Carotid artery occlusion    Past Surgical History  Procedure Laterality Date  . Carotid endarterectomy  03-05-09    right CEA  . Appendectomy    . Hernia repair    . Lipoma excision     . Colonoscopy  Oct. 2013    one polyp   Family History  Problem Relation Age of Onset  . Heart disease Sister   . Cancer Sister   . Hypertension Sister   . Heart attack Sister   . Heart disease Brother   . Stroke Brother   . Hypertension Brother   . Heart disease Brother   . Hyperlipidemia Son    History  Substance Use Topics  . Smoking status: Never Smoker   . Smokeless tobacco: Never Used  . Alcohol Use: No    Review of Systems  Constitutional: Positive for fatigue.  Respiratory: Positive for shortness of breath.   Cardiovascular: Positive for chest pain and leg swelling.  Gastrointestinal: Positive for abdominal pain and abdominal distention.  Neurological: Positive for weakness.  Psychiatric/Behavioral: Positive for sleep disturbance.  All other systems reviewed and are negative.     Allergies  Review of patient's allergies indicates no known allergies.  Home Medications   Prior to Admission medications   Medication Sig Start Date End Date Taking? Authorizing Provider  aspirin EC 81 MG tablet Take 324 mg by mouth once.    Historical Provider, MD  Cholecalciferol (VITAMIN D-3 PO) Take 1 tablet by mouth daily.    Historical Provider, MD  Coenzyme Q10 (COQ-10) 100 MG CAPS Take by mouth daily.    Historical Provider, MD  Multiple Vitamin (MULTIVITAMIN) tablet Take 1 tablet by mouth daily.    Historical Provider, MD  rosuvastatin (  CRESTOR) 5 MG tablet Take 5 mg by mouth daily.    Historical Provider, MD  zolpidem (AMBIEN) 5 MG tablet Take 5 mg by mouth at bedtime as needed.    Historical Provider, MD   BP 123/60 mmHg  Pulse 73  Temp(Src) 98.7 F (37.1 C) (Oral)  Resp 18  Ht 5\' 10"  (1.778 m)  Wt 152 lb (68.947 kg)  BMI 21.81 kg/m2  SpO2 95% Physical Exam  Constitutional: He is oriented to person, place, and time. He appears well-developed and well-nourished. No distress.  HENT:  Head: Normocephalic and atraumatic.  Mouth/Throat: Oropharynx is clear and  moist.  Eyes: Conjunctivae and EOM are normal. Pupils are equal, round, and reactive to light.  Neck: Normal range of motion. Neck supple. No JVD present.  Cardiovascular: Normal rate, regular rhythm, normal heart sounds and intact distal pulses.   No extremity edema.  Pulmonary/Chest: Effort normal and breath sounds normal. No respiratory distress. He has no wheezes. He has no rales.  Abdominal: Soft. Bowel sounds are normal. He exhibits no distension. There is no tenderness.  Musculoskeletal: Normal range of motion. He exhibits no edema.  Neurological: He is alert and oriented to person, place, and time. He has normal strength. No sensory deficit.  Speech fluent, goal oriented. Moves extremities without ataxia. Equal grip strength bilateral.  Skin: Skin is warm and dry. He is not diaphoretic.  Psychiatric: He has a normal mood and affect. His behavior is normal.  Nursing note and vitals reviewed.   ED Course  Procedures (including critical care time) Labs Review Labs Reviewed  CBC - Abnormal; Notable for the following:    Platelets 149 (*)    All other components within normal limits  BASIC METABOLIC PANEL - Abnormal; Notable for the following:    Glucose, Bld 112 (*)    BUN 22 (*)    GFR calc non Af Amer 56 (*)    All other components within normal limits  BRAIN NATRIURETIC PEPTIDE  TROPONIN I    Imaging Review Dg Chest 2 View  08/06/2014   CLINICAL DATA:  Short of breath for several weeks.  Chest pain.  EXAM: CHEST  2 VIEW  COMPARISON:  03/03/2009  FINDINGS: Cardiac silhouette normal in size and configuration. Aorta is mildly tortuous. No mediastinal or hilar masses or evidence of adenopathy.  Mild bilateral interstitial prominence and mild lung hyperexpansion. No lung consolidation or edema. No pleural effusion or pneumothorax.  Bony thorax is demineralized but grossly intact.  IMPRESSION: 1. No acute cardiopulmonary disease. 2. COPD.   Electronically Signed   By: Lajean Manes M.D.   On: 08/06/2014 15:33     EKG Interpretation   Date/Time:  Thursday August 06 2014 14:27:49 EDT Ventricular Rate:  76 PR Interval:  198 QRS Duration: 80 QT Interval:  390 QTC Calculation: 438 R Axis:   46 Text Interpretation:  Sinus rhythm with Premature atrial complexes  Otherwise normal ECG since last tracing no significant change Confirmed by  BELFI  MD, MELANIE ([id number]) on 08/06/2014 2:30:04 PM      MDM   Final diagnoses:  Shortness of breath  Lack of adequate sleep   Nontoxic appearing, NAD. AF VSS. Presenting with multiple symptoms, some of which have been ongoing for a year. Regarding shortness of breath, O2 sat 97% on room air. Chest x-ray clear. Lungs clear. Doubt cardiac. Doubt PE. Workup negative. Abdomen is soft and nontender. For the bloating, I advised him to discuss this with his  gastroenterologist. There is no extremity edema on exam. Regarding lack of sleep, wife is concerned that he is not getting enough sleep and seems tired all the time. I advised him to discuss the zolpidem dosing or possible change in medication with his primary care physician. Stable for d/c. Return precautions given. Patient states understanding of treatment care plan and is agreeable.  Discussed with attending Dr. Mingo Amber who also evaluated patient and agrees with plan of care.  Carman Ching, PA-C 08/06/14 1611  Evelina Bucy, MD 08/06/14 [phone]

## 2014-08-06 NOTE — ED Notes (Addendum)
Sob for several days. Weakness. Abdominal bloating. Ankles sting after eating and keeps him awake at night. His MD is aware of these symptoms and cannot find a cause. His wife states she feels he needs to be given an antidepressant.

## 2014-08-06 NOTE — ED Notes (Signed)
Transported to xray via stretcher.

## 2014-08-06 NOTE — Discharge Instructions (Signed)
Shortness of Breath Shortness of breath means you have trouble breathing. It could also mean that you have a medical problem. You should get immediate medical care for shortness of breath. CAUSES   Not enough oxygen in the air such as with high altitudes or a smoke-filled room.  Certain lung diseases, infections, or problems.  Heart disease or conditions, such as angina or heart failure.  Low red blood cells (anemia).  Poor physical fitness, which can cause shortness of breath when you exercise.  Chest or back injuries or stiffness.  Being overweight.  Smoking.  Anxiety, which can make you feel like you are not getting enough air. DIAGNOSIS  Serious medical problems can often be found during your physical exam. Tests may also be done to determine why you are having shortness of breath. Tests may include:  Chest X-rays.  Lung function tests.  Blood tests.  An electrocardiogram (ECG).  An ambulatory electrocardiogram. An ambulatory ECG records your heartbeat patterns over a 24-hour period.  Exercise testing.  A transthoracic echocardiogram (TTE). During echocardiography, sound waves are used to evaluate how blood flows through your heart.  A transesophageal echocardiogram (TEE).  Imaging scans. Your health care provider may not be able to find a cause for your shortness of breath after your exam. In this case, it is important to have a follow-up exam with your health care provider as directed.  TREATMENT  Treatment for shortness of breath depends on the cause of your symptoms and can vary greatly. HOME CARE INSTRUCTIONS   Do not smoke. Smoking is a common cause of shortness of breath. If you smoke, ask for help to quit.  Avoid being around chemicals or things that may bother your breathing, such as paint fumes and dust.  Rest as needed. Slowly resume your usual activities.  If medicines were prescribed, take them as directed for the full length of time directed. This  includes oxygen and any inhaled medicines.  Keep all follow-up appointments as directed by your health care provider. SEEK MEDICAL CARE IF:   Your condition does not improve in the time expected.  You have a hard time doing your normal activities even with rest.  You have any new symptoms. SEEK IMMEDIATE MEDICAL CARE IF:   Your shortness of breath gets worse.  You feel light-headed, faint, or develop a cough not controlled with medicines.  You start coughing up blood.  You have pain with breathing.  You have chest pain or pain in your arms, shoulders, or abdomen.  You have a fever.  You are unable to walk up stairs or exercise the way you normally do. MAKE SURE YOU:  Understand these instructions.  Will watch your condition.  Will get help right away if you are not doing well or get worse. Document Released: 10/18/2000 Document Revised: 01/28/2013 Document Reviewed: 04/10/2011 [facility] Patient Information 2015 [place], [place]. This information is not intended to replace advice given to you by your health care provider. Make sure you discuss any questions you have with your health care provider.  Insomnia Insomnia is frequent trouble falling and/or staying asleep. Insomnia can be a long term problem or a short term problem. Both are common. Insomnia can be a short term problem when the wakefulness is related to a certain stress or worry. Long term insomnia is often related to ongoing stress during waking hours and/or poor sleeping habits. Overtime, sleep deprivation itself can make the problem worse. Every little thing feels more severe because you are overtired  and your ability to cope is decreased. CAUSES   Stress, anxiety, and depression.  Poor sleeping habits.  Distractions such as TV in the bedroom.  Naps close to bedtime.  Engaging in emotionally charged conversations before bed.  Technical reading before sleep.  Alcohol and other sedatives. They may make the  problem worse. They can hurt normal sleep patterns and normal dream activity.  Stimulants such as caffeine for several hours prior to bedtime.  Pain syndromes and shortness of breath can cause insomnia.  Exercise late at night.  Changing time zones may cause sleeping problems (jet lag). It is sometimes helpful to have someone observe your sleeping patterns. They should look for periods of not breathing during the night (sleep apnea). They should also look to see how long those periods last. If you live alone or observers are uncertain, you can also be observed at a sleep clinic where your sleep patterns will be professionally monitored. Sleep apnea requires a checkup and treatment. Give your caregivers your medical history. Give your caregivers observations your family has made about your sleep.  SYMPTOMS   Not feeling rested in the morning.  Anxiety and restlessness at bedtime.  Difficulty falling and staying asleep. TREATMENT   Your caregiver may prescribe treatment for an underlying medical disorders. Your caregiver can give advice or help if you are using alcohol or other drugs for self-medication. Treatment of underlying problems will usually eliminate insomnia problems.  Medications can be prescribed for short time use. They are generally not recommended for lengthy use.  Over-the-counter sleep medicines are not recommended for lengthy use. They can be habit forming.  You can promote easier sleeping by making lifestyle changes such as:  Using relaxation techniques that help with breathing and reduce muscle tension.  Exercising earlier in the day.  Changing your diet and the time of your last meal. No night time snacks.  Establish a regular time to go to bed.  Counseling can help with stressful problems and worry.  Soothing music and white noise may be helpful if there are background noises you cannot remove.  Stop tedious detailed work at least one hour before  bedtime. HOME CARE INSTRUCTIONS   Keep a diary. Inform your caregiver about your progress. This includes any medication side effects. See your caregiver regularly. Take note of:  Times when you are asleep.  Times when you are awake during the night.  The quality of your sleep.  How you feel the next day. This information will help your caregiver care for you.  Get out of bed if you are still awake after 15 minutes. Read or do some quiet activity. Keep the lights down. Wait until you feel sleepy and go back to bed.  Keep regular sleeping and waking hours. Avoid naps.  Exercise regularly.  Avoid distractions at bedtime. Distractions include watching television or engaging in any intense or detailed activity like attempting to balance the household checkbook.  Develop a bedtime ritual. Keep a familiar routine of bathing, brushing your teeth, climbing into bed at the same time each night, listening to soothing music. Routines increase the success of falling to sleep faster.  Use relaxation techniques. This can be using breathing and muscle tension release routines. It can also include visualizing peaceful scenes. You can also help control troubling or intruding thoughts by keeping your mind occupied with boring or repetitive thoughts like the old concept of counting sheep. You can make it more creative like imagining planting one beautiful flower after  another in your backyard garden.  During your day, work to eliminate stress. When this is not possible use some of the previous suggestions to help reduce the anxiety that accompanies stressful situations. MAKE SURE YOU:   Understand these instructions.  Will watch your condition.  Will get help right away if you are not doing well or get worse. Document Released: 01/21/2000 Document Revised: 04/17/2011 Document Reviewed: 02/20/2007 [facility] Patient Information 2015 [place], [place]. This information is not intended to replace advice  given to you by your health care provider. Make sure you discuss any questions you have with your health care provider.

## 2014-08-24 ENCOUNTER — Telehealth: Payer: Self-pay

## 2014-08-24 DIAGNOSIS — I8390 Asymptomatic varicose veins of unspecified lower extremity: Secondary | ICD-10-CM

## 2014-08-24 DIAGNOSIS — M79606 Pain in leg, unspecified: Secondary | ICD-10-CM

## 2014-08-24 NOTE — Telephone Encounter (Signed)
Phone call from pt.  Requested an appt. With Dr. Kellie Simmering for "leg problems."  Reported he has pain in bilateral ankles, and describes as a "stinging -type pain."  Stated his ankles get red after being up on his feet awhile.  Related that recently he golfed 7 holes, and due to the stinging and redness in the ankles, he hasn't golfed since then.  Stated he has varicose veins in both legs.  Reported there had been swelling in the lower legs, but it has improved.  Reported he still has swelling in the upper legs.  Reported the discomfort in his legs are relieved with standing, and in walking, "unless I walk a long way."  Denied any open sores of lower extremities.  Discussed with Dr. Kellie Simmering.  Recommended bilateral venous reflux study, and office appt.  Will contact pt. with appt. information.

## 2014-10-08 ENCOUNTER — Emergency Department (HOSPITAL_COMMUNITY): Payer: Medicare Other

## 2014-10-08 ENCOUNTER — Emergency Department (HOSPITAL_COMMUNITY)
Admission: EM | Admit: 2014-10-08 | Discharge: 2014-10-08 | Disposition: A | Discharge status: Home / Self Care | Payer: Medicare Other | Attending: Emergency Medicine | Admitting: Emergency Medicine

## 2014-10-08 DIAGNOSIS — M545 Low back pain, unspecified: Secondary | ICD-10-CM

## 2014-10-08 DIAGNOSIS — E785 Hyperlipidemia, unspecified: Secondary | ICD-10-CM | POA: Insufficient documentation

## 2014-10-08 DIAGNOSIS — R252 Cramp and spasm: Secondary | ICD-10-CM | POA: Insufficient documentation

## 2014-10-08 DIAGNOSIS — R200 Anesthesia of skin: Secondary | ICD-10-CM | POA: Insufficient documentation

## 2014-10-08 DIAGNOSIS — I2510 Atherosclerotic heart disease of native coronary artery without angina pectoris: Secondary | ICD-10-CM | POA: Diagnosis not present

## 2014-10-08 DIAGNOSIS — Z79899 Other long term (current) drug therapy: Secondary | ICD-10-CM | POA: Insufficient documentation

## 2014-10-08 DIAGNOSIS — Z7982 Long term (current) use of aspirin: Secondary | ICD-10-CM | POA: Diagnosis not present

## 2014-10-08 DIAGNOSIS — Z9889 Other specified postprocedural states: Secondary | ICD-10-CM | POA: Insufficient documentation

## 2014-10-08 DIAGNOSIS — I10 Essential (primary) hypertension: Secondary | ICD-10-CM | POA: Insufficient documentation

## 2014-10-08 DIAGNOSIS — R52 Pain, unspecified: Secondary | ICD-10-CM | POA: Diagnosis not present

## 2014-10-08 LAB — URINALYSIS, ROUTINE W REFLEX MICROSCOPIC
Bilirubin Urine: NEGATIVE
GLUCOSE, UA: NEGATIVE mg/dL
Hgb urine dipstick: NEGATIVE
KETONES UR: NEGATIVE mg/dL
Leukocytes, UA: NEGATIVE
Nitrite: NEGATIVE
Protein, ur: NEGATIVE mg/dL
Specific Gravity, Urine: 1.013 (ref 1.005–1.030)
Urobilinogen, UA: 1 mg/dL (ref 0.0–1.0)
pH: 7 (ref 5.0–8.0)

## 2014-10-08 LAB — I-STAT CHEM 8, ED
BUN: 24 mg/dL — ABNORMAL HIGH (ref 6–20)
Calcium, Ion: 1.23 mmol/L (ref 1.13–1.30)
Chloride: 106 mmol/L (ref 101–111)
Creatinine, Ser: 0.8 mg/dL (ref 0.61–1.24)
Glucose, Bld: 114 mg/dL — ABNORMAL HIGH (ref 65–99)
HCT: 46 % (ref 39.0–52.0)
HEMOGLOBIN: 15.6 g/dL (ref 13.0–17.0)
Potassium: 3.8 mmol/L (ref 3.5–5.1)
Sodium: 143 mmol/L (ref 135–145)
TCO2: 23 mmol/L (ref 0–100)

## 2014-10-08 MED ORDER — METHOCARBAMOL 500 MG PO TABS: 500.0000 mg | ORAL_TABLET | Freq: Three times a day (TID) | ORAL | Status: DC | PRN

## 2014-10-08 MED ORDER — METHOCARBAMOL 500 MG PO TABS
500.0000 mg | ORAL_TABLET | Freq: Once | ORAL | Status: AC
  Administered 2014-10-08: 500 mg via ORAL
  Filled 2014-10-08: qty 1

## 2014-10-08 NOTE — ED Notes (Signed)
Patient transported to X-ray 

## 2014-10-08 NOTE — Discharge Instructions (Signed)
Back Pain, Adult °Low back pain is very common. About 1 in 5 people have back pain. The cause of low back pain is rarely dangerous. The pain often gets better over time. About half of people with a sudden onset of back pain feel better in just 2 weeks. About 8 in 10 people feel better by 6 weeks.  °CAUSES °Some common causes of back pain include: °· Strain of the muscles or ligaments supporting the spine. °· Wear and tear (degeneration) of the spinal discs. °· Arthritis. °· Direct injury to the back. °DIAGNOSIS °Most of the time, the direct cause of low back pain is not known. However, back pain can be treated effectively even when the exact cause of the pain is unknown. Answering your caregiver's questions about your overall health and symptoms is one of the most accurate ways to make sure the cause of your pain is not dangerous. If your caregiver needs more information, he or she may order lab work or imaging tests (X-rays or MRIs). However, even if imaging tests show changes in your back, this usually does not require surgery. °HOME CARE INSTRUCTIONS °For many people, back pain returns. Since low back pain is rarely dangerous, it is often a condition that people can learn to manage on their own.  °· Remain active. It is stressful on the back to sit or stand in one place. Do not sit, drive, or stand in one place for more than 30 minutes at a time. Take short walks on level surfaces as soon as pain allows. Try to increase the length of time you walk each day. °· Do not stay in bed. Resting more than 1 or 2 days can delay your recovery. °· Do not avoid exercise or work. Your body is made to move. It is not dangerous to be active, even though your back may hurt. Your back will likely heal faster if you return to being active before your pain is gone. °· Pay attention to your body when you  bend and lift. Many people have less discomfort when lifting if they bend their knees, keep the load close to their bodies, and  avoid twisting. Often, the most comfortable positions are those that put less stress on your recovering back. °· Find a comfortable position to sleep. Use a firm mattress and lie on your side with your knees slightly bent. If you lie on your back, put a pillow under your knees. °· Only take over-the-counter or prescription medicines as directed by your caregiver. Over-the-counter medicines to reduce pain and inflammation are often the most helpful. Your caregiver may prescribe muscle relaxant drugs. These medicines help dull your pain so you can more quickly return to your normal activities and healthy exercise. °· Put ice on the injured area. °· Put ice in a plastic bag. °· Place a towel between your skin and the bag. °· Leave the ice on for 15-20 minutes, 03-04 times a day for the first 2 to 3 days. After that, ice and heat may be alternated to reduce pain and spasms. °· Ask your caregiver about trying back exercises and gentle massage. This may be of some benefit. °· Avoid feeling anxious or stressed. Stress increases muscle tension and can worsen back pain. It is important to recognize when you are anxious or stressed and learn ways to manage it. Exercise is a great option. °SEEK MEDICAL CARE IF: °· You have pain that is not relieved with rest or medicine. °· You have pain that does not improve in 1 week. °· You have new symptoms. °· You are generally not feeling well. °SEEK   IMMEDIATE MEDICAL CARE IF:  °· You have pain that radiates from your back into your legs. °· You develop new bowel or bladder control problems. °· You have unusual weakness or numbness in your arms or legs. °· You develop nausea or vomiting. °· You develop abdominal pain. °· You feel faint. °Document Released: 01/23/2005 Document Revised: 07/25/2011 Document Reviewed: 05/27/2013 °[employer]® Patient Information ©2015 [employer]. This information is not intended to replace advice given to you by your health care provider. Make sure you  discuss any questions you have with your health care provider. ° °Muscle Cramps and Spasms °Muscle cramps and spasms occur when a muscle or muscles tighten and you have no control over this tightening (involuntary muscle contraction). They are a common problem and can develop in any muscle. The most common place is in the calf muscles of the leg. Both muscle cramps and muscle spasms are involuntary muscle contractions, but they also have differences:  °· Muscle cramps are sporadic and painful. They may last a few seconds to a quarter of an hour. Muscle cramps are often more forceful and last longer than muscle spasms. °· Muscle spasms may or may not be painful. They may also last just a few seconds or much longer. °CAUSES  °It is uncommon for cramps or spasms to be due to a serious underlying problem. In many cases, the cause of cramps or spasms is unknown. Some common causes are:  °· Overexertion.   °· Overuse from repetitive motions (doing the same thing over and over).   °· Remaining in a certain position for a long period of time.   °· Improper preparation, form, or technique while performing a sport or activity.   °· Dehydration.   °· Injury.   °· Side effects of some medicines.   °· Abnormally low levels of the salts and ions in your blood (electrolytes), especially potassium and calcium. This could happen if you are taking water pills (diuretics) or you are pregnant.   °Some underlying medical problems can make it more likely to develop cramps or spasms. These include, but are not limited to:  °· Diabetes.   °· Parkinson disease.   °· Hormone disorders, such as thyroid problems.   °· Alcohol abuse.   °· Diseases specific to muscles, joints, and bones.   °· Blood vessel disease where not enough blood is getting to the muscles.   °HOME CARE INSTRUCTIONS  °· Stay well hydrated. Drink enough water and fluids to keep your urine clear or pale yellow. °· It may be helpful to massage, stretch, and relax the affected  muscle. °· For tight or tense muscles, use a warm towel, heating pad, or hot shower water directed to the affected area. °· If you are sore or have pain after a cramp or spasm, applying ice to the affected area may relieve discomfort. °¨ Put ice in a plastic bag. °¨ Place a towel between your skin and the bag. °¨ Leave the ice on for 15-20 minutes, 03-04 times a day. °· Medicines used to treat a known cause of cramps or spasms may help reduce their frequency or severity. Only take over-the-counter or prescription medicines as directed by your caregiver. °SEEK MEDICAL CARE IF:  °Your cramps or spasms get more severe, more frequent, or do not improve over time.  °MAKE SURE YOU:  °· Understand these instructions. °· Will watch your condition. °· Will get help right away if you are not doing well or get worse. °Document Released: 07/15/2001 Document Revised: 05/20/2012 Document Reviewed: 01/10/2012 °[employer]® Patient Information ©2015 [employer].   This information is not intended to replace advice given to you by your health care provider. Make sure you discuss any questions you have with your health care provider. ° °

## 2014-10-08 NOTE — ED Notes (Signed)
Bed: EY22 Expected date:  Expected time:  Means of arrival:  Comments: EMS 81 yo male with lower back pain

## 2014-10-08 NOTE — ED Notes (Signed)
Pt from home arrived via EMS, In July, pt c/o pain in back when he bends over. He had back excruciating pain and numbness in Lt. leg tonight with bending over and laid down on the floor for some time prior to calling EMS. Pt has history of sciatica.  BP 156/96 P: 90 RR: 18 CBG-109

## 2014-10-08 NOTE — ED Provider Notes (Signed)
CSN: [id number]   Arrival date & time 10/08/14 0120  History  This chart was scribed for Julianne Rice, MD by Altamease Oiler, ED Scribe. This patient was seen in room WA04/WA04 and the patient's care was started at 1:47 AM.  Chief Complaint  Patient presents with  . Back Pain  . Leg Pain    HPI The history is provided by the patient. No language interpreter was used.   Brought in by EMS, William Mann is a 81 y.o. male who presents to the Emergency Department complaining of back pain exacerbation with gradual onset 2 days ago after bending at the waist. The constant and severe pain worsened just PTA  while trying to get out of bed.  Associated symptoms include an episode of cramping left leg pain that radiated to the groin (resolved). Aleve and 1/2 dose of Zolpidem provided insufficient pain relief over the last 2 days.  Laying in the floor, heat application, and having the leg massaged were also ineffective this morning. Pt denies fever, chills, urinary incontinence, abdominal pain. No focal weakness. Patient has ongoing chronic bilateral foot numbness that is unchanged.  Past Medical History  Diagnosis Date  . Hyperlipidemia   . Hypertension   . Coronary artery disease   . Carotid artery occlusion     Past Surgical History  Procedure Laterality Date  . Carotid endarterectomy  03-05-09    right CEA  . Appendectomy    . Hernia repair    . Lipoma excision    . Colonoscopy  Oct. 2013    one polyp    Family History  Problem Relation Age of Onset  . Heart disease Sister   . Cancer Sister   . Hypertension Sister   . Heart attack Sister   . Heart disease Brother   . Stroke Brother   . Hypertension Brother   . Heart disease Brother   . Hyperlipidemia Son     Social History  Substance Use Topics  . Smoking status: Never Smoker   . Smokeless tobacco: Never Used  . Alcohol Use: No     Review of Systems  Gastrointestinal: Negative for nausea, vomiting and abdominal  pain.  Genitourinary: Negative for difficulty urinating.  Musculoskeletal: Positive for myalgias and back pain. Negative for neck pain.  Skin: Negative for rash and wound.  Neurological: Positive for numbness. Negative for dizziness, weakness, light-headedness and headaches.  All other systems reviewed and are negative.   Home Medications   Prior to Admission medications   Medication Sig Start Date End Date Taking? Authorizing Provider  aspirin EC 81 MG tablet Take 81 mg by mouth daily.    Yes Historical Provider, MD  Cholecalciferol (VITAMIN D-3 PO) Take 1 tablet by mouth daily.   Yes Historical Provider, MD  Coenzyme Q10 (COQ-10) 100 MG CAPS Take by mouth daily.   Yes Historical Provider, MD  Multiple Vitamin (MULTIVITAMIN) tablet Take 1 tablet by mouth daily.   Yes Historical Provider, MD  rosuvastatin (CRESTOR) 5 MG tablet Take 5 mg by mouth daily.   Yes Historical Provider, MD  zolpidem (AMBIEN) 5 MG tablet Take 5 mg by mouth at bedtime as needed for sleep.    Yes Historical Provider, MD  methocarbamol (ROBAXIN) 500 MG tablet Take 1 tablet (500 mg total) by mouth every 8 (eight) hours as needed for muscle spasms. 10/08/14   Julianne Rice, MD    Allergies  Review of patient's allergies indicates no known allergies.  Triage Vitals: BP 154/99  mmHg  Pulse 78  Temp(Src) 97.9 F (36.6 C) (Oral)  Resp 18  SpO2 96%  Physical Exam  Constitutional: He is oriented to person, place, and time. He appears well-developed and well-nourished. No distress.  HENT:  Head: Normocephalic and atraumatic.  Mouth/Throat: Oropharynx is clear and moist.  Eyes: EOM are normal. Pupils are equal, round, and reactive to light.  Neck: Normal range of motion. Neck supple.  Cardiovascular: Normal rate and regular rhythm.   Pulmonary/Chest: Effort normal and breath sounds normal. No respiratory distress. He has no wheezes. He has no rales.  Abdominal: Soft. Bowel sounds are normal. He exhibits no  distension and no mass. There is no tenderness. There is no rebound and no guarding.  No pulsatile masses appreciated  Musculoskeletal: Normal range of motion. He exhibits tenderness. He exhibits no edema.  Mild left lumbar paraspinal tenderness. No midline thoracic or lumbar tenderness. Negative straight leg raise. Distal pulses equal and intact. No calf swelling or tenderness.  Neurological: He is alert and oriented to person, place, and time.  5/5 motor in all extremities. Decreased sensation in bilateral feet but otherwise unchanged.  Skin: Skin is warm and dry. No rash noted. No erythema.  Psychiatric: He has a normal mood and affect. His behavior is normal.  Nursing note and vitals reviewed.   ED Course  Procedures   DIAGNOSTIC STUDIES: Oxygen Saturation is 96% on RA, normal by my interpretation.    COORDINATION OF CARE: 1:53 AM Discussed treatment plan which includes lumbar spine XR and lab work with pt at bedside and pt agreed to plan.  2:38 AM I re-evaluated the patient and provided an update on the results of of his lab work and XR.   Labs Reviewed  I-STAT CHEM 8, ED - Abnormal; Notable for the following:    BUN 24 (*)    Glucose, Bld 114 (*)    All other components within normal limits  URINALYSIS, ROUTINE W REFLEX MICROSCOPIC (NOT AT [facility])    I, Julianne Rice, MD, personally reviewed and evaluated these images and lab results as part of my medical decision-making.  Imaging Review Dg Lumbar Spine Complete  10/08/2014   CLINICAL DATA:  Acute onset of worsening lower back pain, radiating to the groin and both hips. Initial encounter.  EXAM: LUMBAR SPINE - COMPLETE 4+ VIEW  COMPARISON:  CT of the abdomen and pelvis from 06/03/2013  FINDINGS: There is no evidence of acute fracture or subluxation. There may be mild new loss of height involving vertebral body L1, though this appears relatively chronic in nature. Vertebral bodies demonstrate normal height and alignment.  Intervertebral disc spaces are preserved. The visualized neural foramina are grossly unremarkable in appearance.  The visualized bowel gas pattern is unremarkable in appearance; air and stool are noted within the colon. The sacroiliac joints are within normal limits.  IMPRESSION: 1. No evidence of acute fracture or subluxation along the lumbar spine. 2. Suggestion of mild chronic loss of height at L1, new from 2015.   Electronically Signed   By: Garald Balding M.D.   On: 10/08/2014 02:30    EKG Interpretation None     MDM   Final diagnoses:  Left-sided low back pain without sciatica  Muscle cramping     I personally performed the services described in this documentation, which was scribed in my presence. The recorded information has been reviewed and is accurate.  Given patient's age and more acute nature of the symptoms will get plain x-ray  to rule out any type of compression fracture. Also check electrolytes given cramping in the left leg. I have low suspicion for AAA or spinal cord compression.  Patient with stable neuro exam. X-ray without any evidence of fracture. Electrolytes normal. Patient's symptoms have dramatically improved. Advised to follow-up with his primary physician. Return precautions given.  Julianne Rice, MD 10/08/14 [phone]

## 2014-10-09 DIAGNOSIS — M545 Low back pain: Secondary | ICD-10-CM | POA: Diagnosis not present

## 2014-10-09 DIAGNOSIS — Z23 Encounter for immunization: Secondary | ICD-10-CM | POA: Diagnosis not present

## 2014-10-14 DIAGNOSIS — Z85828 Personal history of other malignant neoplasm of skin: Secondary | ICD-10-CM | POA: Diagnosis not present

## 2014-10-14 DIAGNOSIS — D1801 Hemangioma of skin and subcutaneous tissue: Secondary | ICD-10-CM | POA: Diagnosis not present

## 2014-10-14 DIAGNOSIS — L821 Other seborrheic keratosis: Secondary | ICD-10-CM | POA: Diagnosis not present

## 2014-10-14 DIAGNOSIS — L570 Actinic keratosis: Secondary | ICD-10-CM | POA: Diagnosis not present

## 2014-10-16 DIAGNOSIS — M5432 Sciatica, left side: Secondary | ICD-10-CM | POA: Diagnosis not present

## 2014-10-27 DIAGNOSIS — M5432 Sciatica, left side: Secondary | ICD-10-CM | POA: Diagnosis not present

## 2014-10-29 DIAGNOSIS — M5432 Sciatica, left side: Secondary | ICD-10-CM | POA: Diagnosis not present

## 2014-11-03 DIAGNOSIS — M5432 Sciatica, left side: Secondary | ICD-10-CM | POA: Diagnosis not present

## 2014-11-05 DIAGNOSIS — M5432 Sciatica, left side: Secondary | ICD-10-CM | POA: Diagnosis not present

## 2014-11-10 ENCOUNTER — Encounter (HOSPITAL_COMMUNITY): Payer: Medicare Other

## 2014-11-10 ENCOUNTER — Encounter: Payer: Medicare Other | Admitting: Vascular Surgery

## 2014-11-10 DIAGNOSIS — M5432 Sciatica, left side: Secondary | ICD-10-CM | POA: Diagnosis not present

## 2014-11-12 DIAGNOSIS — M5432 Sciatica, left side: Secondary | ICD-10-CM | POA: Diagnosis not present

## 2014-11-17 DIAGNOSIS — M5432 Sciatica, left side: Secondary | ICD-10-CM | POA: Diagnosis not present

## 2014-11-19 DIAGNOSIS — M5432 Sciatica, left side: Secondary | ICD-10-CM | POA: Diagnosis not present

## 2014-11-20 DIAGNOSIS — H2513 Age-related nuclear cataract, bilateral: Secondary | ICD-10-CM | POA: Diagnosis not present

## 2014-11-26 DIAGNOSIS — M5432 Sciatica, left side: Secondary | ICD-10-CM | POA: Diagnosis not present

## 2014-11-30 DIAGNOSIS — M5432 Sciatica, left side: Secondary | ICD-10-CM | POA: Diagnosis not present

## 2014-12-21 DIAGNOSIS — N400 Enlarged prostate without lower urinary tract symptoms: Secondary | ICD-10-CM | POA: Diagnosis not present

## 2014-12-21 DIAGNOSIS — K5900 Constipation, unspecified: Secondary | ICD-10-CM | POA: Diagnosis not present

## 2014-12-21 DIAGNOSIS — I739 Peripheral vascular disease, unspecified: Secondary | ICD-10-CM | POA: Diagnosis not present

## 2014-12-21 DIAGNOSIS — E782 Mixed hyperlipidemia: Secondary | ICD-10-CM | POA: Diagnosis not present

## 2014-12-21 DIAGNOSIS — I700 Atherosclerosis of aorta: Secondary | ICD-10-CM | POA: Diagnosis not present

## 2014-12-21 DIAGNOSIS — Z125 Encounter for screening for malignant neoplasm of prostate: Secondary | ICD-10-CM | POA: Diagnosis not present

## 2014-12-21 DIAGNOSIS — Z1389 Encounter for screening for other disorder: Secondary | ICD-10-CM | POA: Diagnosis not present

## 2014-12-21 DIAGNOSIS — Z0000 Encounter for general adult medical examination without abnormal findings: Secondary | ICD-10-CM | POA: Diagnosis not present

## 2014-12-21 DIAGNOSIS — G4700 Insomnia, unspecified: Secondary | ICD-10-CM | POA: Diagnosis not present

## 2014-12-21 DIAGNOSIS — D696 Thrombocytopenia, unspecified: Secondary | ICD-10-CM | POA: Diagnosis not present

## 2014-12-21 DIAGNOSIS — Z87898 Personal history of other specified conditions: Secondary | ICD-10-CM | POA: Diagnosis not present

## 2014-12-21 DIAGNOSIS — R030 Elevated blood-pressure reading, without diagnosis of hypertension: Secondary | ICD-10-CM | POA: Diagnosis not present

## 2015-02-19 ENCOUNTER — Encounter: Payer: Self-pay | Admitting: Family

## 2015-02-23 ENCOUNTER — Ambulatory Visit (INDEPENDENT_AMBULATORY_CARE_PROVIDER_SITE_OTHER): Payer: Medicare Other | Admitting: Family

## 2015-02-23 ENCOUNTER — Encounter: Payer: Self-pay | Admitting: Family

## 2015-02-23 ENCOUNTER — Ambulatory Visit (HOSPITAL_COMMUNITY)
Admission: RE | Admit: 2015-02-23 | Discharge: 2015-02-23 | Disposition: A | Discharge status: Home / Self Care | Payer: Medicare Other | Source: Ambulatory Visit | Attending: Family | Admitting: Family

## 2015-02-23 VITALS — BP 127/83 | HR 61 | Ht 70.0 in | Wt 157.3 lb

## 2015-02-23 DIAGNOSIS — I6522 Occlusion and stenosis of left carotid artery: Secondary | ICD-10-CM | POA: Insufficient documentation

## 2015-02-23 DIAGNOSIS — R2232 Localized swelling, mass and lump, left upper limb: Secondary | ICD-10-CM | POA: Diagnosis not present

## 2015-02-23 DIAGNOSIS — I6523 Occlusion and stenosis of bilateral carotid arteries: Secondary | ICD-10-CM

## 2015-02-23 DIAGNOSIS — E785 Hyperlipidemia, unspecified: Secondary | ICD-10-CM | POA: Insufficient documentation

## 2015-02-23 DIAGNOSIS — Z48812 Encounter for surgical aftercare following surgery on the circulatory system: Secondary | ICD-10-CM

## 2015-02-23 DIAGNOSIS — I10 Essential (primary) hypertension: Secondary | ICD-10-CM | POA: Insufficient documentation

## 2015-02-23 NOTE — Patient Instructions (Signed)
Stroke Prevention Some medical conditions and behaviors are associated with an increased chance of having a stroke. You may prevent a stroke by making healthy choices and managing medical conditions. HOW CAN I REDUCE MY RISK OF HAVING A STROKE?   Stay physically active. Get at least 30 minutes of activity on most or all days.  Do not smoke. It may also be helpful to avoid exposure to secondhand smoke.  Limit alcohol use. Moderate alcohol use is considered to be:  No more than 2 drinks per day for men.  No more than 1 drink per day for nonpregnant women.  Eat healthy foods. This involves:  Eating 5 or more servings of fruits and vegetables a day.  Making dietary changes that address high blood pressure (hypertension), high cholesterol, diabetes, or obesity.  Manage your cholesterol levels.  Making food choices that are high in fiber and low in saturated fat, trans fat, and cholesterol may control cholesterol levels.  Take any prescribed medicines to control cholesterol as directed by your health care provider.  Manage your diabetes.  Controlling your carbohydrate and sugar intake is recommended to manage diabetes.  Take any prescribed medicines to control diabetes as directed by your health care provider.  Control your hypertension.  Making food choices that are low in salt (sodium), saturated fat, trans fat, and cholesterol is recommended to manage hypertension.  Ask your health care provider if you need treatment to lower your blood pressure. Take any prescribed medicines to control hypertension as directed by your health care provider.  If you are 18-39 years of age, have your blood pressure checked every 3-5 years. If you are 40 years of age or older, have your blood pressure checked every year.  Maintain a healthy weight.  Reducing calorie intake and making food choices that are low in sodium, saturated fat, trans fat, and cholesterol are recommended to manage  weight.  Stop drug abuse.  Avoid taking birth control pills.  Talk to your health care provider about the risks of taking birth control pills if you are over 35 years old, smoke, get migraines, or have ever had a blood clot.  Get evaluated for sleep disorders (sleep apnea).  Talk to your health care provider about getting a sleep evaluation if you snore a lot or have excessive sleepiness.  Take medicines only as directed by your health care provider.  For some people, aspirin or blood thinners (anticoagulants) are helpful in reducing the risk of forming abnormal blood clots that can lead to stroke. If you have the irregular heart rhythm of atrial fibrillation, you should be on a blood thinner unless there is a good reason you cannot take them.  Understand all your medicine instructions.  Make sure that other conditions (such as anemia or atherosclerosis) are addressed. SEEK IMMEDIATE MEDICAL CARE IF:   You have sudden weakness or numbness of the face, arm, or leg, especially on one side of the body.  Your face or eyelid droops to one side.  You have sudden confusion.  You have trouble speaking (aphasia) or understanding.  You have sudden trouble seeing in one or both eyes.  You have sudden trouble walking.  You have dizziness.  You have a loss of balance or coordination.  You have a sudden, severe headache with no known cause.  You have new chest pain or an irregular heartbeat. Any of these symptoms may represent a serious problem that is an emergency. Do not wait to see if the symptoms will   go away. Get medical help at once. Call your local emergency services (911 in [place] Do not drive yourself to the hospital.   This information is not intended to replace advice given to you by your health care provider. Make sure you discuss any questions you have with your health care provider.   Document Released: 03/02/2004 Document Revised: 02/13/2014 Document Reviewed:  07/26/2012 Elsevier Interactive Patient Education 2016 Elsevier Inc.  

## 2015-02-23 NOTE — Progress Notes (Signed)
Chief Complaint: Extracranial Carotid Artery Stenosis   History of Present Illness  William Mann is a 82 y.o. male patient of Dr. Kellie Simmering who is s/p right CEA on 03/05/2009. He returns today for carotid artery surveillance.  In January 2014 he saw Dr. Kellie Simmering for LE pain; lower extremity arterial Dopplers at that time were normal with triphasic flow ABIs of 1.3 bilaterally.  He lost vision in his left eye about 2005, pt states he was told it was a "blood clot behind the eye", was not told it was a stroke.  The patient denies any history of TIA or stroke symptoms.Specifically he denies a history of unilateral facial drooping, hemiparesis, or receptive or expressive aphasia.  Patient reports New Medical or Surgical History: GI bloating and gas at night, has had evaluation for this. He states it is hard for him to keep his weight up, but denies abdominal pain after eating.  He has noticed left brachial, antecubital area, pulsatile mass for about a year, denies swelling, tingling, numbness, or pain in his left forearm/hand. He also notes red streaking in his left forearm some nights that resolves spontaneously.  He had a bx of his thyroid in 2016 that was benign.  Pt Diabetic: No Pt smoker: non-smoker  Pt meds include: Statin : Yes ASA: Yes Other anticoagulants/antiplatelets: no   Past Medical History  Diagnosis Date  . Hyperlipidemia   . Hypertension   . Coronary artery disease   . Carotid artery occlusion     Social History Social History  Substance Use Topics  . Smoking status: Never Smoker   . Smokeless tobacco: Never Used  . Alcohol Use: No    Family History Family History  Problem Relation Age of Onset  . Heart disease Sister   . Cancer Sister   . Hypertension Sister   . Heart attack Sister   . Heart disease Brother   . Stroke Brother   . Hypertension Brother   . Heart disease Brother   . Hyperlipidemia Son     Surgical History Past Surgical  History  Procedure Laterality Date  . Carotid endarterectomy  03-05-09    right CEA  . Appendectomy    . Hernia repair    . Lipoma excision    . Colonoscopy  Oct. 2013    one polyp    No Known Allergies  Current Outpatient Prescriptions  Medication Sig Dispense Refill  . aspirin EC 81 MG tablet Take 81 mg by mouth daily.     . Cholecalciferol (VITAMIN D-3 PO) Take 1 tablet by mouth daily.    . Coenzyme Q10 (COQ-10) 100 MG CAPS Take by mouth daily.    . methocarbamol (ROBAXIN) 500 MG tablet Take 1 tablet (500 mg total) by mouth every 8 (eight) hours as needed for muscle spasms. (Patient not taking: Reported on 02/23/2015) 30 tablet 0  . Multiple Vitamin (MULTIVITAMIN) tablet Take 1 tablet by mouth daily.    . rosuvastatin (CRESTOR) 5 MG tablet Take 5 mg by mouth daily.    Marland Kitchen zolpidem (AMBIEN) 5 MG tablet Take 5 mg by mouth at bedtime as needed for sleep.      No current facility-administered medications for this visit.    Review of Systems : See HPI for pertinent positives and negatives.  Physical Examination  Filed Vitals:   02/23/15 1020 02/23/15 1021  BP: 133/73 127/83  Pulse: 61   Height: 5\' 10"  (1.778 m)   Weight: 157 lb 4.8 oz (71.351 kg)  SpO2: 95%    Body mass index is 22.57 kg/(m^2).  General: WDWN male in NAD GAIT: normal Eyes: PERRLA Pulmonary: CTAB, no rales,  rhonchi, or wheezing.  Cardiac: regular rhythm, no detected murmur.  VASCULAR EXAM Carotid Bruits Left Right   Negative Negative    Radial pulses are 2+ palpable and equal. Left brachial pulsatile mass.      LE Pulses LEFT RIGHT   POPLITEAL not palpable  not palpable   POSTERIOR TIBIAL  palpable   palpable    DORSALIS PEDIS  ANTERIOR TIBIAL palpable  palpable     Gastrointestinal: soft, nontender,  BS WNL, no r/g, no palpable masses.  Musculoskeletal: Appropriate muscle atrophy/wasting for age. M/S 4/5 throughout, Extremities without ischemic changes. Moderate kyphosis.  Neurologic: A&O X 3; Appropriate Affect, normal sensation, Speech is normal CN 2-12 intact, Pain and light touch intact in extremities, Motor exam as listed above.          Non-Invasive Vascular Imaging CAROTID DUPLEX 02/23/2015   Right ICA: no restenosis of CEA site. Left ICA: 1 - 39 % stenosis. No significant change compared to 02/17/14.    Assessment: William Mann is a 82 y.o. male who who is s/p right CEA on 03/05/2009.  He has no history of stroke or TIA. Today's carotid duplex suggests no restenosis of right CEA site and minimal stenosis of the left ICA. No significant change compared to 02/17/14.  Fortunately he does not have DM and has never used tobacco.  He takes a statin and ASA on a regular basis.   Pulsatile mass left antecubital area that pt has noticed for about a year, no claudication sx's in left hand/forearm, 2+ palpable radial pulse; see Plan.  Plan: Follow-up in 2 weeks with left upper extremity arterial duplex, Carotid Duplex scan in a year.   I discussed in depth with the patient the nature of atherosclerosis, and emphasized the importance of maximal medical management including strict control of blood pressure, blood glucose, and lipid levels, obtaining regular exercise, and continued cessation of smoking.  The patient is aware that without maximal medical management the underlying atherosclerotic disease process will progress, limiting the benefit of any interventions. The patient was given information about stroke prevention and what symptoms should prompt the patient to seek immediate medical care. Thank you for allowing [place] to participate in this patient's care.  Clemon Chambers, RN, MSN, FNP-C [facility] Office: [phone]  Clinic  Physician: Early  02/23/2015 10:45 AM

## 2015-03-02 ENCOUNTER — Encounter: Payer: Self-pay | Admitting: Family

## 2015-03-11 ENCOUNTER — Other Ambulatory Visit: Payer: Self-pay

## 2015-03-11 ENCOUNTER — Ambulatory Visit (INDEPENDENT_AMBULATORY_CARE_PROVIDER_SITE_OTHER): Payer: Medicare Other | Admitting: Family

## 2015-03-11 ENCOUNTER — Encounter: Payer: Self-pay | Admitting: Family

## 2015-03-11 ENCOUNTER — Ambulatory Visit (HOSPITAL_COMMUNITY)
Admission: RE | Admit: 2015-03-11 | Discharge: 2015-03-11 | Disposition: A | Discharge status: Home / Self Care | Payer: Medicare Other | Source: Ambulatory Visit | Attending: Family | Admitting: Family

## 2015-03-11 VITALS — BP 142/90 | HR 55 | Ht 70.0 in | Wt 158.0 lb

## 2015-03-11 DIAGNOSIS — Z48812 Encounter for surgical aftercare following surgery on the circulatory system: Secondary | ICD-10-CM

## 2015-03-11 DIAGNOSIS — E785 Hyperlipidemia, unspecified: Secondary | ICD-10-CM | POA: Insufficient documentation

## 2015-03-11 DIAGNOSIS — R2232 Localized swelling, mass and lump, left upper limb: Secondary | ICD-10-CM

## 2015-03-11 DIAGNOSIS — M79602 Pain in left arm: Secondary | ICD-10-CM | POA: Diagnosis not present

## 2015-03-11 DIAGNOSIS — I6523 Occlusion and stenosis of bilateral carotid arteries: Secondary | ICD-10-CM

## 2015-03-11 DIAGNOSIS — I10 Essential (primary) hypertension: Secondary | ICD-10-CM | POA: Diagnosis not present

## 2015-03-11 NOTE — Progress Notes (Signed)
Chief Complaint: pulsating mass left upper arm   History of Present Illness  William Mann is a 82 y.o. male patient of Dr. Kellie Simmering who is s/p right CEA on 03/05/2009. He returns today for arterial duplex evaluation of pulsating mass on his left upper arm that I found on exam on 02/23/15.   In January 2014 he saw Dr. Kellie Simmering for LE pain; lower extremity arterial Dopplers at that time were normal with triphasic flow ABIs of 1.3 bilaterally.  He lost vision in his left eye about 2005, pt states he was told it was a "blood clot behind the eye", was not told it was a stroke.  The patient denies any history of TIA or stroke symptoms.Specifically he denies a history of unilateral facial drooping, hemiparesis, or receptive or expressive aphasia.  Patient reports New Medical or Surgical History: GI bloating and gas at night, has had evaluation for this. He states it is hard for him to keep his weight up, but denies abdominal pain after eating.  He has noticed left brachial, antecubital area, pulsatile mass for about a year, denies swelling, tingling, numbness, or pain in his left forearm/hand. He also notes red streaking in his left forearm some nights that resolves spontaneously.  He had a bx of his thyroid in 2016 that was benign.  Pt Diabetic: No Pt smoker: non-smoker  Pt meds include: Statin : Yes ASA: Yes Other anticoagulants/antiplatelets: no   Past Medical History  Diagnosis Date  . Hyperlipidemia   . Hypertension   . Coronary artery disease   . Carotid artery occlusion     Social History Social History  Substance Use Topics  . Smoking status: Never Smoker   . Smokeless tobacco: Never Used  . Alcohol Use: No    Family History Family History  Problem Relation Age of Onset  . Heart disease Sister   . Cancer Sister   . Hypertension Sister   . Heart attack Sister   . Heart disease Brother   . Stroke Brother   . Hypertension Brother   . Heart disease Brother    . Hyperlipidemia Son     Surgical History Past Surgical History  Procedure Laterality Date  . Carotid endarterectomy  03-05-09    right CEA  . Appendectomy    . Hernia repair    . Lipoma excision    . Colonoscopy  Oct. 2013    one polyp    No Known Allergies  Current Outpatient Prescriptions  Medication Sig Dispense Refill  . aspirin EC 81 MG tablet Take 81 mg by mouth daily.     . Cholecalciferol (VITAMIN D-3 PO) Take 1 tablet by mouth daily.    . Coenzyme Q10 (COQ-10) 100 MG CAPS Take by mouth daily.    . methocarbamol (ROBAXIN) 500 MG tablet Take 1 tablet (500 mg total) by mouth every 8 (eight) hours as needed for muscle spasms. (Patient not taking: Reported on 02/23/2015) 30 tablet 0  . Multiple Vitamin (MULTIVITAMIN) tablet Take 1 tablet by mouth daily.    . rosuvastatin (CRESTOR) 5 MG tablet Take 5 mg by mouth daily.    Marland Kitchen zolpidem (AMBIEN) 5 MG tablet Take 5 mg by mouth at bedtime as needed for sleep.      No current facility-administered medications for this visit.    Review of Systems : See HPI for pertinent positives and negatives.  Physical Examination  Filed Vitals:   03/11/15 1538 03/11/15 1541  BP: 153/80 142/90  Pulse:  55   Height: 5\' 10"  (1.778 m)   Weight: 158 lb (71.668 kg)   SpO2: 96%    Body mass index is 22.67 kg/(m^2).   General: WDWN male in NAD GAIT: normal Eyes: PERRLA Pulmonary: CTAB, no rales, rhonchi, or wheezing.  Cardiac: regular rhythm, no detected murmur.  VASCULAR EXAM Carotid Bruits Left Right   Negative Negative    Radial pulses are 2+ palpable and equal. Left brachial mass is not enlarged or pulsatile today.      LE Pulses LEFT RIGHT   POPLITEAL not palpable  not palpable   POSTERIOR TIBIAL  palpable   palpable    DORSALIS  PEDIS  ANTERIOR TIBIAL palpable  palpable     Gastrointestinal: soft, nontender, BS WNL, no r/g, no palpable masses.  Musculoskeletal: Appropriate muscle atrophy/wasting for age. M/S 4/5 throughout, Extremities without ischemic changes. Moderate kyphosis.  Neurologic: A&O X 3; Appropriate Affect, normal sensation, Speech is normal CN 2-12 intact, Pain and light touch intact in extremities, Motor exam as listed above.               Non-Invasive Vascular Imaging: 03/11/2015   UPPER EXTREMITY ARTERIAL DUPLEX EVALUATION    INDICATION: Left upper extremity pain, pulsatile mass left Brachial / antecubital fossa for approximately one  year.    PREVIOUS INTERVENTION(S):     DUPLEX EXAM:     RIGHT LOCATION LEFT   Peak Systolic Velocity (cm/s) Ratio (if abnormal) Waveform  Peak Systolic Velocity (cm/s) Ratio (if abnormal) Waveform     Subclavian Artery 73  T     Axillary Artery 108  T     Brachial Artery Proximal 108  T     Brachial Artery Mid 89  T     Brachial Artery Distal  104  T     Radial Artery Proximal 89  T     Radial Artery Distal 56  T     Ulnar Artery Proximal 75  T     Ulnar Artery Distal 66  T   Waveform:    M - Monophasic       B - Biphasic       T - Triphasic    ADDITIONAL FINDINGS: Evidence of thickened walls with scattered calcification, not flow reducing    IMPRESSION: 1. No evidence of left upper extremity arterial occlusive disease. 2. Normal anatomy at site of antecubital fossa (veins noted to be compressed).     Assessment: William Mann is a 82 y.o. male who is s/p right CEA on 03/05/2009.  He has no history of stroke or TIA. 02/23/15 carotid duplex suggests no restenosis of right CEA site and minimal stenosis of the left ICA. No significant change compared to 02/17/14.  Fortunately he does not have DM and has never used tobacco.  He takes a statin and ASA on a regular basis.   Pulsatile mass left antecubital area that  pt has noticed for about a year, no claudication sx's in left hand/forearm, 2+ palpable radial pulse. Left UE arterial duplex today suggests no evidence of left upper extremity arterial occlusive disease. Normal anatomy at site of antecubital fossa (veins noted to be compressed). Evidence of thickened walls with scattered calcification, not flow reducing.     Plan: Follow-up in 1 year with Carotid Duplex scan as already scheduled.   I discussed in depth with the patient the nature of atherosclerosis, and emphasized the importance of maximal medical management including strict control of blood pressure, blood  glucose, and lipid levels, obtaining regular exercise, and continued cessation of smoking.  The patient is aware that without maximal medical management the underlying atherosclerotic disease process will progress, limiting the benefit of any interventions. The patient was given information about stroke prevention and what symptoms should prompt the patient to seek immediate medical care. Thank you for allowing [place] to participate in this patient's care.  Clemon Chambers, RN, MSN, FNP-C [facility] Office: [phone]  Clinic Physician: Oneida Alar  03/11/2015 3:22 PM

## 2015-03-18 DIAGNOSIS — Z85828 Personal history of other malignant neoplasm of skin: Secondary | ICD-10-CM | POA: Diagnosis not present

## 2015-03-18 DIAGNOSIS — L821 Other seborrheic keratosis: Secondary | ICD-10-CM | POA: Diagnosis not present

## 2015-03-18 DIAGNOSIS — C44519 Basal cell carcinoma of skin of other part of trunk: Secondary | ICD-10-CM | POA: Diagnosis not present

## 2015-03-18 DIAGNOSIS — L570 Actinic keratosis: Secondary | ICD-10-CM | POA: Diagnosis not present

## 2015-03-18 DIAGNOSIS — C44629 Squamous cell carcinoma of skin of left upper limb, including shoulder: Secondary | ICD-10-CM | POA: Diagnosis not present

## 2015-03-18 DIAGNOSIS — C44619 Basal cell carcinoma of skin of left upper limb, including shoulder: Secondary | ICD-10-CM | POA: Diagnosis not present

## 2015-03-18 DIAGNOSIS — D485 Neoplasm of uncertain behavior of skin: Secondary | ICD-10-CM | POA: Diagnosis not present

## 2015-03-18 DIAGNOSIS — C44529 Squamous cell carcinoma of skin of other part of trunk: Secondary | ICD-10-CM | POA: Diagnosis not present

## 2015-04-20 DIAGNOSIS — F5101 Primary insomnia: Secondary | ICD-10-CM | POA: Diagnosis not present

## 2015-04-20 DIAGNOSIS — R002 Palpitations: Secondary | ICD-10-CM | POA: Diagnosis not present

## 2015-04-20 DIAGNOSIS — K5904 Chronic idiopathic constipation: Secondary | ICD-10-CM | POA: Diagnosis not present

## 2015-04-23 ENCOUNTER — Ambulatory Visit (INDEPENDENT_AMBULATORY_CARE_PROVIDER_SITE_OTHER): Payer: Medicare Other

## 2015-04-23 ENCOUNTER — Encounter: Payer: Self-pay | Admitting: Nurse Practitioner

## 2015-04-23 ENCOUNTER — Ambulatory Visit (INDEPENDENT_AMBULATORY_CARE_PROVIDER_SITE_OTHER): Payer: Medicare Other | Admitting: Nurse Practitioner

## 2015-04-23 VITALS — BP 138/80 | HR 72

## 2015-04-23 DIAGNOSIS — R011 Cardiac murmur, unspecified: Secondary | ICD-10-CM | POA: Diagnosis not present

## 2015-04-23 DIAGNOSIS — R002 Palpitations: Secondary | ICD-10-CM

## 2015-04-23 DIAGNOSIS — I6523 Occlusion and stenosis of bilateral carotid arteries: Secondary | ICD-10-CM

## 2015-04-23 NOTE — Progress Notes (Signed)
CARDIOLOGY OFFICE NOTE  Date:  04/23/2015    [name] Date of Birth: Dec 09, 1929 Medical Record [id number]  PCP:  Gara Kroner, MD  Cardiologist:  Seen Dr. Tamala Julian in the past (no details in [facility])    Chief Complaint  Patient presents with  . Palpitations    Work in visit - apparently has seen Dr. Tamala Julian in the past.     History of Present Illness: William Mann is a 82 y.o. male who presents today for a work in visit. Apparently has seen Dr. Tamala Julian in the past.   He has a history of PVD with prior R CEA - followed by [facility]; HLD, constipation, insomnia, BPH, GERD, macrocytosis, left retinal vein occlusion post Viagr and thyroid nodule.   Cardiac status/history not clear.   Referred here for "irregular heartbeat/palpitations". Mostly at night. Present for last 1 to 2 years but seems worse.   Comes in today. Here with his wife. He tells me that he saw Dr. Tamala Julian because of leg pain several years ago - has had remote stress test, but no cath reported. Says he has never had MI, stroke. Here today because of "these heart flutters" - worse at night and worse when his "stomach is acting up" - might have once a week - no syncope but dizzy at times. Walks occasionally. He has DOE. Admits he is anxious. Wife says he does not use much caffeine but he does drink coffee in the AM. Normal TSH back in the fall. Followed by Dr. Earlean Shawl for his GI issues.    Past Medical History  Diagnosis Date  . Hyperlipidemia   . Hypertension   . Coronary artery disease   . Carotid artery occlusion   . Constipation   . Insomnia   . PVD (peripheral vascular disease) ([place])   . BPH (benign prostatic hyperplasia)   . Cancer ([place])     basal cell carcinoma  . Fatty liver   . Thyroid nodule   . Aortic atherosclerosis ([place])   . Emphysema lung ([place])   . Bilateral hearing loss   . GERD (gastroesophageal reflux disease)     Past Surgical History  Procedure Laterality Date  . Carotid  endarterectomy  03-05-09    right CEA  . Appendectomy    . Hernia repair    . Lipoma excision    . Colonoscopy  Oct. 2013    one polyp     Medications: Current Outpatient Prescriptions  Medication Sig Dispense Refill  . aspirin EC 81 MG tablet Take 81 mg by mouth daily.     . Cholecalciferol (VITAMIN D-3 PO) Take 1 tablet by mouth daily.    . Coenzyme Q10 (COQ-10) 100 MG CAPS Take by mouth daily.    . cyanocobalamin 100 MCG tablet Take 100 mcg by mouth daily.    . Multiple Vitamin (MULTIVITAMIN) tablet Take 1 tablet by mouth daily.    . simvastatin (ZOCOR) 10 MG tablet Take 10 mg by mouth daily.    . traZODone (DESYREL) 50 MG tablet Take 50 mg by mouth at bedtime.    Marland Kitchen zolpidem (AMBIEN) 5 MG tablet Take 5 mg by mouth at bedtime as needed for sleep.      No current facility-administered medications for this visit.    Allergies: Allergies  Allergen Reactions  . Crestor [Rosuvastatin] Other (See Comments)    Myalgia   . Lipitor [Atorvastatin] Other (See Comments)    Liver inflammation   . Viagra [  Sildenafil]     Social History: The patient  reports that he has never smoked. He has never used smokeless tobacco. He reports that he does not drink alcohol or use illicit drugs.   Family History: The patient's family history includes Cancer in his sister; Heart attack in his sister; Heart disease in his brother, brother, and sister; Hyperlipidemia in his son; Hypertension in his brother and sister; Stroke in his brother.   Review of Systems: Please see the history of present illness.   Otherwise, the review of systems is positive for poor sleep.   All other systems are reviewed and negative.   Physical Exam: VS:  BP 138/80 mmHg  Pulse 72 .  BMI There is no weight on file to calculate BMI.  Wt Readings from Last 3 Encounters:  03/11/15 158 lb (71.668 kg)  02/23/15 157 lb 4.8 oz (71.351 kg)  08/06/14 152 lb (68.947 kg)    General: Elderly male who is alert and in no acute  distress. He does seem anxious.  HEENT: Normal. Neck: Supple, no JVD, carotid bruits, or masses noted.  Cardiac: Regular rate and rhythm. Holosystolic murmur noted. No edema.  Respiratory:  Lungs are clear to auscultation bilaterally with normal work of breathing.  GI: Soft and nontender.  MS: No deformity or atrophy. Gait and ROM intact. He is kyphotic. Skin: Warm and dry. Color is normal.  Neuro:  Strength and sensation are intact and no gross focal deficits noted.  Psych: Alert, appropriate and with normal affect.   LABORATORY DATA:  EKG:  EKG is not ordered today. EKG reviewed from PCP - shows NSR with a PVC noted.   Lab Results  Component Value Date   WBC 6.1 08/06/2014   HGB 15.6 10/08/2014   HCT 46.0 10/08/2014   PLT 149* 08/06/2014   GLUCOSE 114* 10/08/2014   ALT 23 02/20/2013   AST 26 02/20/2013   NA 143 10/08/2014   K 3.8 10/08/2014   CL 106 10/08/2014   CREATININE 0.80 10/08/2014   BUN 24* 10/08/2014   CO2 25 08/06/2014   INR 1.00 03/03/2009    BNP (last 3 results)  Recent Labs  08/06/14 1505  BNP 40.7    ProBNP (last 3 results) No results for input(s): PROBNP in the last 8760 hours.   Other Studies Reviewed Today:   Assessment/Plan: 1. Palpitations - will place event monitor - his symptoms do not occur every day. Get echocardiogram as well. I have not added additional medicine at this time but could consider beta blocker therapy. Further disposition to follow. Have asked for prior records from [facility].   2. Murmur - will get echocardiogram.   3. GI issues - followed by Dr. Earlean Shawl  4. HTN - ok on current regimen. Would monitor for now.   5. Anxiety - this does seem to be driving some of his issues.   Current medicines are reviewed with the patient today.  The patient does not have concerns regarding medicines other than what has been noted above.  The following changes have been made:  See above.  Labs/ tests ordered today include:      Orders Placed This Encounter  Procedures  . Cardiac event monitor  . ECHOCARDIOGRAM COMPLETE     Disposition:   Further disposition pending. Tentatively see back if studies are abnormal. Need [facility] records.    Patient is agreeable to this plan and will call if any problems develop in the interim.   Signed: Cecille Rubin  Marin Olp, RN, ANP-C 04/23/2015 4:19 PM  [facility] [address] [place], [place]  [address] Phone: [phone] Fax: [phone]

## 2015-04-23 NOTE — Patient Instructions (Addendum)
We will be checking the following labs today - NONE   Medication Instructions:    Continue with your current medicines.     Testing/Procedures To Be Arranged:  Echocardiogram  Event monitor  Follow-Up:   Will see how your studies turn out and then decide about follow up     Other Special Instructions:   N/A    If you need a refill on your cardiac medications before your next appointment, please call your pharmacy.   Call the [place] office at [phone] if you have any questions, problems or concerns.

## 2015-05-12 ENCOUNTER — Other Ambulatory Visit (HOSPITAL_COMMUNITY): Payer: Medicare Other

## 2015-05-26 ENCOUNTER — Other Ambulatory Visit (HOSPITAL_COMMUNITY): Payer: Medicare Other

## 2015-06-03 ENCOUNTER — Other Ambulatory Visit: Payer: Self-pay

## 2015-06-03 ENCOUNTER — Encounter: Payer: Self-pay | Admitting: Physician Assistant

## 2015-06-03 ENCOUNTER — Ambulatory Visit (HOSPITAL_COMMUNITY): Payer: Medicare Other | Attending: Cardiology

## 2015-06-03 DIAGNOSIS — I2510 Atherosclerotic heart disease of native coronary artery without angina pectoris: Secondary | ICD-10-CM | POA: Diagnosis not present

## 2015-06-03 DIAGNOSIS — E785 Hyperlipidemia, unspecified: Secondary | ICD-10-CM | POA: Insufficient documentation

## 2015-06-03 DIAGNOSIS — R002 Palpitations: Secondary | ICD-10-CM | POA: Diagnosis not present

## 2015-06-03 DIAGNOSIS — I10 Essential (primary) hypertension: Secondary | ICD-10-CM | POA: Insufficient documentation

## 2015-06-03 DIAGNOSIS — Z8249 Family history of ischemic heart disease and other diseases of the circulatory system: Secondary | ICD-10-CM | POA: Insufficient documentation

## 2015-06-03 DIAGNOSIS — I77810 Thoracic aortic ectasia: Secondary | ICD-10-CM | POA: Insufficient documentation

## 2015-06-03 DIAGNOSIS — R011 Cardiac murmur, unspecified: Secondary | ICD-10-CM | POA: Insufficient documentation

## 2015-06-03 DIAGNOSIS — R012 Other cardiac sounds: Secondary | ICD-10-CM | POA: Diagnosis present

## 2015-06-08 DIAGNOSIS — G4700 Insomnia, unspecified: Secondary | ICD-10-CM | POA: Diagnosis not present

## 2015-06-08 DIAGNOSIS — I739 Peripheral vascular disease, unspecified: Secondary | ICD-10-CM | POA: Diagnosis not present

## 2015-06-08 DIAGNOSIS — I700 Atherosclerosis of aorta: Secondary | ICD-10-CM | POA: Diagnosis not present

## 2015-06-08 DIAGNOSIS — E782 Mixed hyperlipidemia: Secondary | ICD-10-CM | POA: Diagnosis not present

## 2015-06-08 DIAGNOSIS — K5900 Constipation, unspecified: Secondary | ICD-10-CM | POA: Diagnosis not present

## 2015-06-08 DIAGNOSIS — J439 Emphysema, unspecified: Secondary | ICD-10-CM | POA: Diagnosis not present

## 2015-06-08 DIAGNOSIS — Z87898 Personal history of other specified conditions: Secondary | ICD-10-CM | POA: Diagnosis not present

## 2015-06-08 DIAGNOSIS — N400 Enlarged prostate without lower urinary tract symptoms: Secondary | ICD-10-CM | POA: Diagnosis not present

## 2015-06-09 ENCOUNTER — Telehealth: Payer: Self-pay | Admitting: Nurse Practitioner

## 2015-06-09 NOTE — Telephone Encounter (Signed)
New Message ° °Pt called for ECHO results °

## 2015-11-16 DIAGNOSIS — R1032 Left lower quadrant pain: Secondary | ICD-10-CM | POA: Diagnosis not present

## 2015-11-22 DIAGNOSIS — J439 Emphysema, unspecified: Secondary | ICD-10-CM | POA: Diagnosis not present

## 2015-11-22 DIAGNOSIS — K5900 Constipation, unspecified: Secondary | ICD-10-CM | POA: Diagnosis not present

## 2015-11-22 DIAGNOSIS — Z87898 Personal history of other specified conditions: Secondary | ICD-10-CM | POA: Diagnosis not present

## 2015-11-22 DIAGNOSIS — Z23 Encounter for immunization: Secondary | ICD-10-CM | POA: Diagnosis not present

## 2015-11-22 DIAGNOSIS — I739 Peripheral vascular disease, unspecified: Secondary | ICD-10-CM | POA: Diagnosis not present

## 2015-11-22 DIAGNOSIS — G4700 Insomnia, unspecified: Secondary | ICD-10-CM | POA: Diagnosis not present

## 2015-11-22 DIAGNOSIS — N400 Enlarged prostate without lower urinary tract symptoms: Secondary | ICD-10-CM | POA: Diagnosis not present

## 2015-11-22 DIAGNOSIS — I700 Atherosclerosis of aorta: Secondary | ICD-10-CM | POA: Diagnosis not present

## 2015-11-22 DIAGNOSIS — E782 Mixed hyperlipidemia: Secondary | ICD-10-CM | POA: Diagnosis not present

## 2015-12-02 ENCOUNTER — Telehealth: Payer: Self-pay

## 2015-12-02 DIAGNOSIS — R202 Paresthesia of skin: Principal | ICD-10-CM

## 2015-12-02 DIAGNOSIS — R200 Anesthesia of skin: Secondary | ICD-10-CM

## 2015-12-02 DIAGNOSIS — I6523 Occlusion and stenosis of bilateral carotid arteries: Secondary | ICD-10-CM

## 2015-12-02 NOTE — Telephone Encounter (Signed)
William Mann, I reviewed his last carotid duplex results and my last note. His left internal carotid stenosis was <40%. The left carotid artery perfuses the left side of the brain which controls the right side of the body. There is no intervention on his left carotid artery that we could do that would help his right arm symptoms. It is unlikely that he has developed any significant blockage in the left carotid artery in the interim.  If he has any arthritis in his neck or back, this could lead to a partial pinching of the spinal cord in his neck that could cause some right arm tingling. I can see him soon, but another carotid duplex before a years time would probably not be helpful.  Vinnie Level

## 2015-12-02 NOTE — Telephone Encounter (Signed)
Phone call from pt.  Reported a 2-3 month hx of numbness/ tingling in right shoulder and right hand; reported it is "not continuous, but it occurs frequently."  Denied color or temperature change in the right UE.  No c/o weakness in the right lower extremity.  No c/o any dysphasia, visual loss, or any other neurological issues.  Reported he has a 1 yr. f/u in January, and is requesting to be checked before then.  Questioned pt. about any symptoms in left upper extremity, due to hx. of pulsatile mass of left antecubital area.  Denied any numbness/ tingling of left UE, but stated the area it more visually prominent when he lays down at night.  The pt. Is requesting to be assigned to Dr. Donnetta Hutching, since Dr. Kellie Simmering has retired.  Advised will request the Nurse Practitioner, that last evaluated him 03/2015, to review his chart.  Will call him back with further recommendations.  Agreed with plan.

## 2015-12-03 NOTE — Addendum Note (Signed)
Addended by: Denman George on: 12/03/2015 11:30 AM   Modules accepted: Orders

## 2015-12-03 NOTE — Telephone Encounter (Signed)
Further discussed pt's. Complaints with the nurse practitioner.  rec'd verbal order to schedule for a carotid duplex in early November, to rule out any progression of carotid disease contributing to his symptoms.  Pt. will be notified of appts.

## 2015-12-09 DIAGNOSIS — Z85828 Personal history of other malignant neoplasm of skin: Secondary | ICD-10-CM | POA: Diagnosis not present

## 2015-12-09 DIAGNOSIS — D485 Neoplasm of uncertain behavior of skin: Secondary | ICD-10-CM | POA: Diagnosis not present

## 2015-12-09 DIAGNOSIS — D1801 Hemangioma of skin and subcutaneous tissue: Secondary | ICD-10-CM | POA: Diagnosis not present

## 2015-12-09 DIAGNOSIS — C44519 Basal cell carcinoma of skin of other part of trunk: Secondary | ICD-10-CM | POA: Diagnosis not present

## 2015-12-09 DIAGNOSIS — L821 Other seborrheic keratosis: Secondary | ICD-10-CM | POA: Diagnosis not present

## 2015-12-09 DIAGNOSIS — L570 Actinic keratosis: Secondary | ICD-10-CM | POA: Diagnosis not present

## 2015-12-09 DIAGNOSIS — C44329 Squamous cell carcinoma of skin of other parts of face: Secondary | ICD-10-CM | POA: Diagnosis not present

## 2015-12-10 ENCOUNTER — Encounter: Payer: Self-pay | Admitting: Family

## 2015-12-13 ENCOUNTER — Ambulatory Visit (HOSPITAL_COMMUNITY)
Admission: RE | Admit: 2015-12-13 | Discharge: 2015-12-13 | Disposition: A | Discharge status: Home / Self Care | Payer: Medicare Other | Source: Ambulatory Visit | Attending: Surgery | Admitting: Surgery

## 2015-12-13 DIAGNOSIS — R200 Anesthesia of skin: Secondary | ICD-10-CM | POA: Diagnosis not present

## 2015-12-13 DIAGNOSIS — R202 Paresthesia of skin: Secondary | ICD-10-CM | POA: Diagnosis not present

## 2015-12-13 DIAGNOSIS — I6523 Occlusion and stenosis of bilateral carotid arteries: Secondary | ICD-10-CM | POA: Diagnosis not present

## 2015-12-13 LAB — VAS US CAROTID
LCCAPDIAS: 23 cm/s
LEFT ECA DIAS: -14 cm/s
LEFT VERTEBRAL DIAS: 16 cm/s
LICADDIAS: -22 cm/s
LICAPDIAS: 25 cm/s
Left CCA dist dias: -26 cm/s
Left CCA dist sys: -117 cm/s
Left CCA prox sys: 97 cm/s
Left ICA dist sys: -63 cm/s
Left ICA prox sys: 112 cm/s
RCCAPDIAS: -15 cm/s
RIGHT CCA MID DIAS: 13 cm/s
Right CCA prox sys: -74 cm/s
Right cca dist sys: -67 cm/s

## 2015-12-14 ENCOUNTER — Encounter: Payer: Self-pay | Admitting: Family

## 2015-12-14 ENCOUNTER — Ambulatory Visit (INDEPENDENT_AMBULATORY_CARE_PROVIDER_SITE_OTHER): Payer: Medicare Other | Admitting: Family

## 2015-12-14 VITALS — BP 134/86 | HR 83 | Temp 98.4°F | Ht 68.0 in | Wt 152.0 lb

## 2015-12-14 DIAGNOSIS — I6521 Occlusion and stenosis of right carotid artery: Secondary | ICD-10-CM

## 2015-12-14 DIAGNOSIS — I8393 Asymptomatic varicose veins of bilateral lower extremities: Secondary | ICD-10-CM

## 2015-12-14 DIAGNOSIS — Z9889 Other specified postprocedural states: Secondary | ICD-10-CM

## 2015-12-14 DIAGNOSIS — R200 Anesthesia of skin: Secondary | ICD-10-CM | POA: Diagnosis not present

## 2015-12-14 DIAGNOSIS — I6523 Occlusion and stenosis of bilateral carotid arteries: Secondary | ICD-10-CM | POA: Diagnosis not present

## 2015-12-14 DIAGNOSIS — R202 Paresthesia of skin: Secondary | ICD-10-CM

## 2015-12-14 NOTE — Patient Instructions (Signed)
Stroke Prevention Some medical conditions and behaviors are associated with an increased chance of having a stroke. You may prevent a stroke by making healthy choices and managing medical conditions. HOW CAN I REDUCE MY RISK OF HAVING A STROKE?   Stay physically active. Get at least 30 minutes of activity on most or all days.  Do not smoke. It may also be helpful to avoid exposure to secondhand smoke.  Limit alcohol use. Moderate alcohol use is considered to be:  No more than 2 drinks per day for men.  No more than 1 drink per day for nonpregnant women.  Eat healthy foods. This involves:  Eating 5 or more servings of fruits and vegetables a day.  Making dietary changes that address high blood pressure (hypertension), high cholesterol, diabetes, or obesity.  Manage your cholesterol levels.  Making food choices that are high in fiber and low in saturated fat, trans fat, and cholesterol may control cholesterol levels.  Take any prescribed medicines to control cholesterol as directed by your health care provider.  Manage your diabetes.  Controlling your carbohydrate and sugar intake is recommended to manage diabetes.  Take any prescribed medicines to control diabetes as directed by your health care provider.  Control your hypertension.  Making food choices that are low in salt (sodium), saturated fat, trans fat, and cholesterol is recommended to manage hypertension.  Ask your health care provider if you need treatment to lower your blood pressure. Take any prescribed medicines to control hypertension as directed by your health care provider.  If you are 18-39 years of age, have your blood pressure checked every 3-5 years. If you are 40 years of age or older, have your blood pressure checked every year.  Maintain a healthy weight.  Reducing calorie intake and making food choices that are low in sodium, saturated fat, trans fat, and cholesterol are recommended to manage  weight.  Stop drug abuse.  Avoid taking birth control pills.  Talk to your health care provider about the risks of taking birth control pills if you are over 35 years old, smoke, get migraines, or have ever had a blood clot.  Get evaluated for sleep disorders (sleep apnea).  Talk to your health care provider about getting a sleep evaluation if you snore a lot or have excessive sleepiness.  Take medicines only as directed by your health care provider.  For some people, aspirin or blood thinners (anticoagulants) are helpful in reducing the risk of forming abnormal blood clots that can lead to stroke. If you have the irregular heart rhythm of atrial fibrillation, you should be on a blood thinner unless there is a good reason you cannot take them.  Understand all your medicine instructions.  Make sure that other conditions (such as anemia or atherosclerosis) are addressed. SEEK IMMEDIATE MEDICAL CARE IF:   You have sudden weakness or numbness of the face, arm, or leg, especially on one side of the body.  Your face or eyelid droops to one side.  You have sudden confusion.  You have trouble speaking (aphasia) or understanding.  You have sudden trouble seeing in one or both eyes.  You have sudden trouble walking.  You have dizziness.  You have a loss of balance or coordination.  You have a sudden, severe headache with no known cause.  You have new chest pain or an irregular heartbeat. Any of these symptoms may represent a serious problem that is an emergency. Do not wait to see if the symptoms will   go away. Get medical help at once. Call your local emergency services (911 in [place] Do not drive yourself to the hospital.   This information is not intended to replace advice given to you by your health care provider. Make sure you discuss any questions you have with your health care provider.   Document Released: 03/02/2004 Document Revised: 02/13/2014 Document Reviewed:  07/26/2012 Elsevier Interactive Patient Education 2016 Elsevier Inc[employer]    Varicose Veins Varicose veins are veins that have become enlarged and twisted. They are usually seen in the legs but can occur in other parts of the body as well. CAUSES This condition is the result of valves in the veins not working properly. Valves in the veins help to return blood from the leg to the heart. If these valves are damaged, blood flows backward and backs up into the veins in the leg near the skin. This causes the veins to become larger. RISK FACTORS People who are on their feet a lot, who are pregnant, or who are overweight are more likely to develop varicose veins. SIGNS AND SYMPTOMS  Bulging, twisted-appearing, bluish veins, most commonly found on the legs.  Leg pain or a feeling of heaviness. These symptoms may be worse at the end of the day.  Leg swelling.  Changes in skin color. DIAGNOSIS A health care provider can usually diagnose varicose veins by examining your legs. Your health care provider may also recommend an ultrasound of your leg veins. TREATMENT Most varicose veins can be treated at home.However, other treatments are available for people who have persistent symptoms or want to improve the cosmetic appearance of the varicose veins. These treatment options include:  Sclerotherapy. A solution is injected into the vein to close it off.  Laser treatment. A laser is used to heat the vein to close it off.  Radiofrequency vein ablation. An electrical current produced by radio waves is used to close off the vein.  Phlebectomy. The vein is surgically removed through small incisions made over the varicose vein.  Vein ligation and stripping. The vein is surgically removed through incisions made over the varicose vein after the vein has been tied (ligated). HOME CARE INSTRUCTIONS  Do not stand or sit in one position for long periods of time. Do not sit with your legs crossed. Rest with your  legs raised during the day.  Wear compression stockings as directed by your health care provider. These stockings help to prevent blood clots and reduce swelling in your legs.  Do not wear other tight, encircling garments around your legs, pelvis, or waist.  Walk as much as possible to increase blood flow.  Raise the foot of your bed at night with 2-inch blocks.  If you get a cut in the skin over the vein and the vein bleeds, lie down with your leg raised and press on it with a clean cloth until the bleeding stops. Then place a bandage (dressing) on the cut. See your health care provider if it continues to bleed. SEEK MEDICAL CARE IF:  The skin around your ankle starts to break down.  You have pain, redness, tenderness, or hard swelling in your leg over a vein.  You are uncomfortable because of leg pain.   This information is not intended to replace advice given to you by your health care provider. Make sure you discuss any questions you have with your health care provider.   Document Released: 11/02/2004 Document Revised: 02/13/2014 Document Reviewed: 06/10/2013 Elsevier Interactive Patient Education 2016  [place] 20-30 mm mercury graduated knee high compression hose.

## 2015-12-14 NOTE — Progress Notes (Signed)
Chief Complaint: Follow up Extracranial Carotid Artery Stenosis   History of Present Illness  William Mann is a 82 y.o. male  patient of Dr. Kellie Simmering who is s/p right CEA on 03/05/2009.  He returns today with c/o 2-3 month hx of numbness/ tingling in right shoulder and right hand; reported it is "not continuous, but it occurs frequently."  Denied color or temperature change in the right UE.  No c/o weakness in the right lower extremity.  No c/o any dysphasia, visual loss, or any other neurological issues.  Reported he has a 1 yr. f/u in January, and is requesting to be checked before then.  Questioned pt. about any symptoms in left upper extremity, due to hx. of pulsatile mass of left antecubital area.  Denied any numbness/ tingling of left UE, but stated the area it more visually prominent when he lays down at night.  The pt. Is requesting to be assigned to Dr. Donnetta Hutching, since Dr. Kellie Simmering has retired.  He has noticed intermittent right side facial numbness since about October 2017. He denies right arm or leg hemiparesis, denies speech or vision difficulties.   In January 2014 he saw Dr. Kellie Simmering for LE pain; lower extremity arterial Dopplers at that time were normal with triphasic flow ABIs of 1.3 bilaterally.  He lost vision in his left eye about 2005, pt states he was told it was a "blood clot behind the eye", was not told it was a stroke.  The patient denies any history of TIA or stroke symptoms.Specifically he denies a history of unilateral facial drooping, hemiparesis, or receptive or expressive aphasia.  He has  GI bloating and gas at night, has had evaluation for this. He states it is hard for him to keep his weight up, but denies abdominal pain after eating. He denies food fear.  He has lost 5 pounds since his February 2017 visit.   He had a bx of his thyroid in 2016 that was benign.  Pt Diabetic: No Pt smoker: non-smoker  Pt meds include: Statin : Yes ASA: Yes Other  anticoagulants/antiplatelets: no    Past Medical History:  Diagnosis Date  . Aortic atherosclerosis ([place])   . Bilateral hearing loss   . BPH (benign prostatic hyperplasia)   . Cancer ([place])    basal cell carcinoma  . Carotid artery occlusion   . Constipation   . Coronary artery disease   . Emphysema lung ([place])   . Fatty liver   . GERD (gastroesophageal reflux disease)   . History of echocardiogram    Echo 4/17: Vigorous LVEF, EF 65-70%, normal wall motion, grade 1 diastolic dysfunction, mildly dilated ascending aorta (40 mm)  . Hyperlipidemia   . Hypertension   . Insomnia   . PVD (peripheral vascular disease) ([place])   . Thyroid nodule     Social History Social History  Substance Use Topics  . Smoking status: Never Smoker  . Smokeless tobacco: Never Used  . Alcohol use No    Family History Family History  Problem Relation Age of Onset  . Heart disease Sister   . Cancer Sister   . Hypertension Sister   . Heart attack Sister   . Heart disease Brother   . Stroke Brother   . Hypertension Brother   . Heart disease Brother   . Hyperlipidemia Son     Surgical History Past Surgical History:  Procedure Laterality Date  . APPENDECTOMY    . CAROTID ENDARTERECTOMY  03-05-09   right  CEA  . COLONOSCOPY  Oct. 2013   one polyp  . HERNIA REPAIR    . LIPOMA EXCISION      Allergies  Allergen Reactions  . Crestor [Rosuvastatin] Other (See Comments)    Myalgia   . Lipitor [Atorvastatin] Other (See Comments)    Liver inflammation   . Viagra [Sildenafil]     Current Outpatient Prescriptions  Medication Sig Dispense Refill  . aspirin EC 81 MG tablet Take 81 mg by mouth daily.     . Calcium Carbonate (CALTRATE 600 PO) Take by mouth daily.    . Cholecalciferol (VITAMIN D-3 PO) Take 1 tablet by mouth daily.    . Coenzyme Q10 (COQ-10) 100 MG CAPS Take by mouth daily.    . cyanocobalamin 100 MCG tablet Take 100 mcg by mouth daily.    . Multiple Vitamin (MULTIVITAMIN)  tablet Take 1 tablet by mouth daily.    . simvastatin (ZOCOR) 10 MG tablet Take 10 mg by mouth daily.    . traZODone (DESYREL) 50 MG tablet Take 50 mg by mouth at bedtime.    Marland Kitchen zolpidem (AMBIEN) 5 MG tablet Take 5 mg by mouth at bedtime as needed for sleep.      No current facility-administered medications for this visit.     Review of Systems : See HPI for pertinent positives and negatives.  Physical Examination  Vitals:   12/14/15 0923 12/14/15 0931  BP: (!) 149/91 134/86  Pulse: 86 83  Temp: 98.4 F (36.9 C)   SpO2: 96%   Weight: 152 lb (68.9 kg)   Height: 5\' 8"  (1.727 m)    Body mass index is 23.11 kg/m.  General: WDWN male in NAD GAIT: normal Eyes: PERRLA Pulmonary: Respirations are non labored, CTAB, no rales, rhonchi, or wheezing.  Cardiac: regular rhythm, no detected murmur.  VASCULAR EXAM Carotid Bruits Left Right   Negative Negative    Radial pulses are 2+ palpable and equal. Left brachial mass is not enlarged or pulsatile today.      LE Pulses LEFT RIGHT   POPLITEAL not palpable  not palpable   POSTERIOR TIBIAL  palpable   palpable    DORSALIS PEDIS  ANTERIOR TIBIAL palpable  palpable     Gastrointestinal: soft, nontender, BS WNL, no r/g, no palpable masses.  Musculoskeletal: Appropriate muscle atrophy/wasting for age. M/S 4/5 throughout, Extremities without ischemic changes. Moderate kyphosis.  Neurologic: A&O X 3; Appropriate Affect, normal sensation, Speech is normal CN 2-12 intact, Pain and light touch intact in extremities, Motor exam as listed above.     Assessment: William Mann is a 82 y.o. male who is s/p right CEA on 03/05/2009.  He has no history of stroke or TIA.  Fortunately he does not have DM and has  never used tobacco.  He takes a statin and ASA on a regular basis.   Pulsatile mass left antecubital area that pt has noticed for about a year, no claudication sx's in left hand/forearm, 2+ palpable radial pulse.  His right facial intermittent numbess in the last month and right arm tingling and numbness are not due to lack of arterial perfusion or stenosis.     DATA Carotid duplex today suggests no restenosis of right CEA site and minimal stenosis of the left ICA. Bilateral vertebral arteries have antegrade flow. Bilateral subclavian arteries have normal waveforms. No significant change compared to last exam on 02/23/15.  03/16/15 Left UE arterial duplex suggests no evidence of left upper extremity arterial occlusive  disease. Normal anatomy at site of antecubital fossa (veins noted to be compressed). Evidence of thickened walls with scattered calcification, not flow reducing.    Plan: Referral to neurologist to evaluate 1 month hx of intermittent right side facial numbess.   Follow-up in 1 year with Carotid Duplex scan; cancel appointment and testing before then.    I discussed in depth with the patient the nature of atherosclerosis, and emphasized the importance of maximal medical management including strict control of blood pressure, blood glucose, and lipid levels, obtaining regular exercise, and continued cessation of smoking.  The patient is aware that without maximal medical management the underlying atherosclerotic disease process will progress, limiting the benefit of any interventions. The patient was given information about stroke prevention and what symptoms should prompt the patient to seek immediate medical care. Thank you for allowing [place] to participate in this patient's care.  Clemon Chambers, RN, MSN, FNP-C [facility] Office: [phone]  Clinic Physician: Early  12/14/15 9:39 AM

## 2015-12-29 NOTE — Addendum Note (Signed)
Addended by: Lianne Cure A on: 12/29/2015 09:43 AM   Modules accepted: Orders

## 2016-01-11 DIAGNOSIS — N400 Enlarged prostate without lower urinary tract symptoms: Secondary | ICD-10-CM | POA: Diagnosis not present

## 2016-01-11 DIAGNOSIS — I700 Atherosclerosis of aorta: Secondary | ICD-10-CM | POA: Diagnosis not present

## 2016-01-11 DIAGNOSIS — K5900 Constipation, unspecified: Secondary | ICD-10-CM | POA: Diagnosis not present

## 2016-01-11 DIAGNOSIS — Z0000 Encounter for general adult medical examination without abnormal findings: Secondary | ICD-10-CM | POA: Diagnosis not present

## 2016-01-11 DIAGNOSIS — G4700 Insomnia, unspecified: Secondary | ICD-10-CM | POA: Diagnosis not present

## 2016-01-11 DIAGNOSIS — Z87898 Personal history of other specified conditions: Secondary | ICD-10-CM | POA: Diagnosis not present

## 2016-01-11 DIAGNOSIS — R030 Elevated blood-pressure reading, without diagnosis of hypertension: Secondary | ICD-10-CM | POA: Diagnosis not present

## 2016-01-11 DIAGNOSIS — J439 Emphysema, unspecified: Secondary | ICD-10-CM | POA: Diagnosis not present

## 2016-01-11 DIAGNOSIS — I739 Peripheral vascular disease, unspecified: Secondary | ICD-10-CM | POA: Diagnosis not present

## 2016-01-11 DIAGNOSIS — E782 Mixed hyperlipidemia: Secondary | ICD-10-CM | POA: Diagnosis not present

## 2016-01-11 DIAGNOSIS — R200 Anesthesia of skin: Secondary | ICD-10-CM | POA: Diagnosis not present

## 2016-01-11 DIAGNOSIS — Z1389 Encounter for screening for other disorder: Secondary | ICD-10-CM | POA: Diagnosis not present

## 2016-01-21 ENCOUNTER — Telehealth: Payer: Self-pay

## 2016-01-21 NOTE — Telephone Encounter (Signed)
Patient called to cancel urology referral.

## 2016-02-29 ENCOUNTER — Encounter (HOSPITAL_COMMUNITY): Payer: Medicare Other

## 2016-02-29 ENCOUNTER — Ambulatory Visit: Payer: Medicare Other | Admitting: Family

## 2016-03-10 DIAGNOSIS — R32 Unspecified urinary incontinence: Secondary | ICD-10-CM | POA: Diagnosis not present

## 2016-03-10 DIAGNOSIS — I739 Peripheral vascular disease, unspecified: Secondary | ICD-10-CM | POA: Diagnosis not present

## 2016-03-10 DIAGNOSIS — I6521 Occlusion and stenosis of right carotid artery: Secondary | ICD-10-CM | POA: Diagnosis not present

## 2016-03-10 DIAGNOSIS — N400 Enlarged prostate without lower urinary tract symptoms: Secondary | ICD-10-CM | POA: Diagnosis not present

## 2016-04-06 DIAGNOSIS — H2510 Age-related nuclear cataract, unspecified eye: Secondary | ICD-10-CM | POA: Diagnosis not present

## 2016-04-06 DIAGNOSIS — H34832 Tributary (branch) retinal vein occlusion, left eye, with macular edema: Secondary | ICD-10-CM | POA: Diagnosis not present

## 2016-04-06 DIAGNOSIS — H25093 Other age-related incipient cataract, bilateral: Secondary | ICD-10-CM | POA: Diagnosis not present

## 2016-04-06 DIAGNOSIS — H5203 Hypermetropia, bilateral: Secondary | ICD-10-CM | POA: Diagnosis not present

## 2016-06-07 DIAGNOSIS — D692 Other nonthrombocytopenic purpura: Secondary | ICD-10-CM | POA: Diagnosis not present

## 2016-06-07 DIAGNOSIS — Z85828 Personal history of other malignant neoplasm of skin: Secondary | ICD-10-CM | POA: Diagnosis not present

## 2016-06-07 DIAGNOSIS — D1801 Hemangioma of skin and subcutaneous tissue: Secondary | ICD-10-CM | POA: Diagnosis not present

## 2016-06-07 DIAGNOSIS — D485 Neoplasm of uncertain behavior of skin: Secondary | ICD-10-CM | POA: Diagnosis not present

## 2016-06-07 DIAGNOSIS — D235 Other benign neoplasm of skin of trunk: Secondary | ICD-10-CM | POA: Diagnosis not present

## 2016-06-07 DIAGNOSIS — L570 Actinic keratosis: Secondary | ICD-10-CM | POA: Diagnosis not present

## 2016-06-07 DIAGNOSIS — C434 Malignant melanoma of scalp and neck: Secondary | ICD-10-CM | POA: Diagnosis not present

## 2016-06-07 DIAGNOSIS — L821 Other seborrheic keratosis: Secondary | ICD-10-CM | POA: Diagnosis not present

## 2016-06-19 DIAGNOSIS — Z85820 Personal history of malignant melanoma of skin: Secondary | ICD-10-CM | POA: Diagnosis not present

## 2016-06-19 DIAGNOSIS — L821 Other seborrheic keratosis: Secondary | ICD-10-CM | POA: Diagnosis not present

## 2016-06-19 DIAGNOSIS — L989 Disorder of the skin and subcutaneous tissue, unspecified: Secondary | ICD-10-CM | POA: Diagnosis not present

## 2016-06-19 DIAGNOSIS — Z85828 Personal history of other malignant neoplasm of skin: Secondary | ICD-10-CM | POA: Diagnosis not present

## 2016-06-19 DIAGNOSIS — C434 Malignant melanoma of scalp and neck: Secondary | ICD-10-CM | POA: Diagnosis not present

## 2016-06-19 DIAGNOSIS — D034 Melanoma in situ of scalp and neck: Secondary | ICD-10-CM | POA: Diagnosis not present

## 2016-06-20 DIAGNOSIS — L988 Other specified disorders of the skin and subcutaneous tissue: Secondary | ICD-10-CM | POA: Diagnosis not present

## 2016-06-20 DIAGNOSIS — C434 Malignant melanoma of scalp and neck: Secondary | ICD-10-CM | POA: Diagnosis not present

## 2016-06-20 DIAGNOSIS — D044 Carcinoma in situ of skin of scalp and neck: Secondary | ICD-10-CM | POA: Diagnosis not present

## 2016-06-21 DIAGNOSIS — C434 Malignant melanoma of scalp and neck: Secondary | ICD-10-CM | POA: Diagnosis not present

## 2016-07-18 DIAGNOSIS — H9193 Unspecified hearing loss, bilateral: Secondary | ICD-10-CM | POA: Diagnosis not present

## 2016-07-18 DIAGNOSIS — E782 Mixed hyperlipidemia: Secondary | ICD-10-CM | POA: Diagnosis not present

## 2016-07-18 DIAGNOSIS — J439 Emphysema, unspecified: Secondary | ICD-10-CM | POA: Diagnosis not present

## 2016-07-18 DIAGNOSIS — K5900 Constipation, unspecified: Secondary | ICD-10-CM | POA: Diagnosis not present

## 2016-07-18 DIAGNOSIS — N400 Enlarged prostate without lower urinary tract symptoms: Secondary | ICD-10-CM | POA: Diagnosis not present

## 2016-07-18 DIAGNOSIS — G4700 Insomnia, unspecified: Secondary | ICD-10-CM | POA: Diagnosis not present

## 2016-07-18 DIAGNOSIS — I700 Atherosclerosis of aorta: Secondary | ICD-10-CM | POA: Diagnosis not present

## 2016-07-18 DIAGNOSIS — I739 Peripheral vascular disease, unspecified: Secondary | ICD-10-CM | POA: Diagnosis not present

## 2016-07-18 DIAGNOSIS — Z87898 Personal history of other specified conditions: Secondary | ICD-10-CM | POA: Diagnosis not present

## 2016-08-23 DIAGNOSIS — S92531A Displaced fracture of distal phalanx of right lesser toe(s), initial encounter for closed fracture: Secondary | ICD-10-CM | POA: Diagnosis not present

## 2016-08-23 DIAGNOSIS — T148XXA Other injury of unspecified body region, initial encounter: Secondary | ICD-10-CM | POA: Diagnosis not present

## 2016-09-20 DIAGNOSIS — S92531D Displaced fracture of distal phalanx of right lesser toe(s), subsequent encounter for fracture with routine healing: Secondary | ICD-10-CM | POA: Diagnosis not present

## 2016-09-30 ENCOUNTER — Encounter (HOSPITAL_BASED_OUTPATIENT_CLINIC_OR_DEPARTMENT_OTHER): Payer: Self-pay

## 2016-09-30 ENCOUNTER — Emergency Department (HOSPITAL_BASED_OUTPATIENT_CLINIC_OR_DEPARTMENT_OTHER): Payer: No Typology Code available for payment source

## 2016-09-30 ENCOUNTER — Emergency Department (HOSPITAL_BASED_OUTPATIENT_CLINIC_OR_DEPARTMENT_OTHER)
Admission: EM | Admit: 2016-09-30 | Discharge: 2016-09-30 | Disposition: A | Discharge status: Home / Self Care | Payer: No Typology Code available for payment source | Attending: Emergency Medicine | Admitting: Emergency Medicine

## 2016-09-30 DIAGNOSIS — I10 Essential (primary) hypertension: Secondary | ICD-10-CM | POA: Insufficient documentation

## 2016-09-30 DIAGNOSIS — Y929 Unspecified place or not applicable: Secondary | ICD-10-CM | POA: Diagnosis not present

## 2016-09-30 DIAGNOSIS — S0083XA Contusion of other part of head, initial encounter: Secondary | ICD-10-CM | POA: Diagnosis not present

## 2016-09-30 DIAGNOSIS — Z7982 Long term (current) use of aspirin: Secondary | ICD-10-CM | POA: Insufficient documentation

## 2016-09-30 DIAGNOSIS — M545 Low back pain, unspecified: Secondary | ICD-10-CM

## 2016-09-30 DIAGNOSIS — Z85828 Personal history of other malignant neoplasm of skin: Secondary | ICD-10-CM | POA: Insufficient documentation

## 2016-09-30 DIAGNOSIS — Y999 Unspecified external cause status: Secondary | ICD-10-CM | POA: Insufficient documentation

## 2016-09-30 DIAGNOSIS — Y939 Activity, unspecified: Secondary | ICD-10-CM | POA: Diagnosis not present

## 2016-09-30 DIAGNOSIS — Z79899 Other long term (current) drug therapy: Secondary | ICD-10-CM | POA: Diagnosis not present

## 2016-09-30 DIAGNOSIS — R0781 Pleurodynia: Secondary | ICD-10-CM | POA: Diagnosis not present

## 2016-09-30 DIAGNOSIS — S0990XA Unspecified injury of head, initial encounter: Secondary | ICD-10-CM | POA: Diagnosis present

## 2016-09-30 DIAGNOSIS — I2510 Atherosclerotic heart disease of native coronary artery without angina pectoris: Secondary | ICD-10-CM | POA: Diagnosis not present

## 2016-09-30 DIAGNOSIS — S299XXA Unspecified injury of thorax, initial encounter: Secondary | ICD-10-CM | POA: Diagnosis not present

## 2016-09-30 NOTE — ED Notes (Addendum)
EDP at BS at time of d/c 
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
## 2016-09-30 NOTE — Discharge Instructions (Signed)
Your imaging today did not show evidence of fracture, dislocation, or serious injury to your lungs. Your exam was reassuring and after our shared decision-making conversation, we decided not to image her head today. Please follow-up with your primary care physician in the next several days. If any symptoms change or worsen, please return to the nearest emergency department.

## 2016-09-30 NOTE — ED Notes (Signed)
Pt at desk for an update, RN aware.
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
## 2016-09-30 NOTE — ED Notes (Signed)
MD made aware of patient's BP  

## 2016-09-30 NOTE — ED Notes (Addendum)
Alert, NAD, calm, interactive, resps e/u, speaking in clear complete sentences, no dyspnea noted, skin W&D, VSS, "pain with movement only" (denies: pain, sob, nausea, dizziness, or visual changes). Family x3 at [facility].

## 2016-10-03 DIAGNOSIS — R0781 Pleurodynia: Secondary | ICD-10-CM | POA: Diagnosis not present

## 2016-10-03 DIAGNOSIS — Z1389 Encounter for screening for other disorder: Secondary | ICD-10-CM | POA: Diagnosis not present

## 2016-10-03 DIAGNOSIS — R030 Elevated blood-pressure reading, without diagnosis of hypertension: Secondary | ICD-10-CM | POA: Diagnosis not present

## 2016-10-03 DIAGNOSIS — K5900 Constipation, unspecified: Secondary | ICD-10-CM | POA: Diagnosis not present

## 2016-10-03 DIAGNOSIS — H9193 Unspecified hearing loss, bilateral: Secondary | ICD-10-CM | POA: Diagnosis not present

## 2016-10-10 DIAGNOSIS — S338XXA Sprain of other parts of lumbar spine and pelvis, initial encounter: Secondary | ICD-10-CM | POA: Diagnosis not present

## 2016-10-10 DIAGNOSIS — R197 Diarrhea, unspecified: Secondary | ICD-10-CM | POA: Diagnosis not present

## 2016-10-10 DIAGNOSIS — R350 Frequency of micturition: Secondary | ICD-10-CM | POA: Diagnosis not present

## 2016-10-13 DIAGNOSIS — R350 Frequency of micturition: Secondary | ICD-10-CM | POA: Diagnosis not present

## 2016-10-19 DIAGNOSIS — N400 Enlarged prostate without lower urinary tract symptoms: Secondary | ICD-10-CM | POA: Diagnosis not present

## 2016-10-19 DIAGNOSIS — R5383 Other fatigue: Secondary | ICD-10-CM | POA: Diagnosis not present

## 2016-10-19 DIAGNOSIS — R945 Abnormal results of liver function studies: Secondary | ICD-10-CM | POA: Diagnosis not present

## 2016-10-27 ENCOUNTER — Other Ambulatory Visit: Payer: Self-pay

## 2016-10-27 DIAGNOSIS — I83893 Varicose veins of bilateral lower extremities with other complications: Secondary | ICD-10-CM

## 2016-10-30 DIAGNOSIS — L131 Subcorneal pustular dermatitis: Secondary | ICD-10-CM | POA: Diagnosis not present

## 2016-10-30 DIAGNOSIS — Z85820 Personal history of malignant melanoma of skin: Secondary | ICD-10-CM | POA: Diagnosis not present

## 2016-10-30 DIAGNOSIS — D044 Carcinoma in situ of skin of scalp and neck: Secondary | ICD-10-CM | POA: Diagnosis not present

## 2016-10-30 DIAGNOSIS — D485 Neoplasm of uncertain behavior of skin: Secondary | ICD-10-CM | POA: Diagnosis not present

## 2016-10-30 DIAGNOSIS — Z85828 Personal history of other malignant neoplasm of skin: Secondary | ICD-10-CM | POA: Diagnosis not present

## 2016-11-07 ENCOUNTER — Encounter: Payer: Self-pay | Admitting: Vascular Surgery

## 2016-11-09 ENCOUNTER — Encounter: Payer: Self-pay | Admitting: Vascular Surgery

## 2016-11-09 ENCOUNTER — Ambulatory Visit (INDEPENDENT_AMBULATORY_CARE_PROVIDER_SITE_OTHER): Payer: Medicare Other | Admitting: Vascular Surgery

## 2016-11-09 ENCOUNTER — Ambulatory Visit (HOSPITAL_COMMUNITY)
Admission: RE | Admit: 2016-11-09 | Discharge: 2016-11-09 | Disposition: A | Discharge status: Home / Self Care | Payer: Medicare Other | Source: Ambulatory Visit | Attending: Vascular Surgery | Admitting: Vascular Surgery

## 2016-11-09 VITALS — BP 179/97 | HR 63 | Temp 97.6°F | Resp 20 | Ht 68.0 in | Wt 151.0 lb

## 2016-11-09 DIAGNOSIS — I83813 Varicose veins of bilateral lower extremities with pain: Secondary | ICD-10-CM | POA: Diagnosis not present

## 2016-11-09 DIAGNOSIS — I83893 Varicose veins of bilateral lower extremities with other complications: Secondary | ICD-10-CM | POA: Insufficient documentation

## 2016-11-09 DIAGNOSIS — R609 Edema, unspecified: Secondary | ICD-10-CM | POA: Diagnosis present

## 2016-11-09 NOTE — Progress Notes (Signed)
Vascular and Vein Specialist of [facility]  Patient name: William Mann MRN: [id number] DOB: 09-14-1929 Sex: male  REASON FOR VISIT: Evaluation of bilateral lower from the venous hypertension  HPI: William Mann is a 83 y.o. male here today for evaluation of his bilateral lower from the venous hypertension. He is known to our practice from a prior right carotid endarterectomy with Dr. Kellie Simmering in 2011. He undergoes yearly carotid duplex follow-up and was last seen in November with no evidence of recurrence and minimal stenosis in his left carotid system. He does have a long history of bilateral [place] venous hypertension and skin changes. He is at work lifelong as a Art gallery manager. He has not had any evidence of DVT. 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 He has questions whether this could be related to arterial or venous pathology. He has no history of peripheral vascular occlusive disease aside from his carotid stenosis  Past Medical History:  Diagnosis Date  . Aortic atherosclerosis ([place])   . Bilateral hearing loss   . BPH (benign prostatic hyperplasia)   . Cancer ([place])    basal cell carcinoma  . Carotid artery occlusion   . Constipation   . Coronary artery disease   . Emphysema lung ([place])   . Fatty liver   . GERD (gastroesophageal reflux disease)   . History of echocardiogram    Echo 4/17: Vigorous LVEF, EF 65-70%, normal wall motion, grade 1 diastolic dysfunction, mildly dilated ascending aorta (40 mm)  . Hyperlipidemia   . Hypertension   . Insomnia   . PVD (peripheral vascular disease) ([place])   . Thyroid nodule     Family History  Problem Relation Age of Onset  . Heart disease Sister   . Cancer Sister   . Hypertension Sister   . Heart attack Sister   . Heart disease Brother   . Stroke Brother   . Hypertension Brother   . Heart disease Brother   . Hyperlipidemia Son     SOCIAL  HISTORY: Social History  Substance Use Topics  . Smoking status: Never Smoker  . Smokeless tobacco: Never Used  . Alcohol use No    Allergies  Allergen Reactions  . Crestor [Rosuvastatin] Other (See Comments)    Myalgia   . Lipitor [Atorvastatin] Other (See Comments)    Liver inflammation   . Viagra [Sildenafil]     Current Outpatient Prescriptions  Medication Sig Dispense Refill  . zolpidem (AMBIEN) 5 MG tablet Take 5 mg by mouth at bedtime as needed for sleep.     Marland Kitchen aspirin EC 81 MG tablet Take 81 mg by mouth daily.     . Calcium Carbonate (CALTRATE 600 PO) Take by mouth daily.    . Cholecalciferol (VITAMIN William-3 PO) Take 1 tablet by mouth daily.    . Coenzyme Q10 (COQ-10) 100 MG CAPS Take by mouth daily.    . cyanocobalamin 100 MCG tablet Take 100 mcg by mouth daily.    . Multiple Vitamin (MULTIVITAMIN) tablet Take 1 tablet by mouth daily.    . simvastatin (ZOCOR) 10 MG tablet Take 10 mg by mouth daily.    . traZODone (DESYREL) 50 MG tablet Take 50 mg by mouth at bedtime.     No current facility-administered medications for this visit.     REVIEW OF SYSTEMS:  [X]  denotes positive finding, [ ]  denotes negative finding Cardiac  Comments:  Chest pain or chest pressure:  Shortness of breath upon exertion:    Short of breath when lying flat:    Irregular heart rhythm:        Vascular    Pain in calf, thigh, or hip brought on by ambulation: x   Pain in feet at night that wakes you up from your sleep:     Blood clot in your veins:    Leg swelling:  x         PHYSICAL EXAM: Vitals:   11/09/16 1033 11/09/16 1035  BP: (!) 183/92 (!) 179/97  Pulse: 63   Resp: 20   Temp: 97.6 F (36.4 C)   TempSrc: Oral   SpO2: 97%   Weight: 151 lb (68.5 kg)   Height: 5\' 8"  (1.727 m)     GENERAL: The patient is a well-nourished male, in no acute distress. The vital signs are documented above. CARDIOVASCULAR: Carotid arteries without bruits bilaterally. 2+ radial and 2+ dorsalis  pedis pulses bilaterally Marked extensive varicosities in both legs extending from his knees down onto his feet and ankles bilaterally. Marked hemosiderin deposit as well PULMONARY: There is good air exchange  MUSCULOSKELETAL: There are no major deformities or cyanosis. NEURO.LOGIC: No focal weakness or paresthesias are detected. SKIN: No open ulcers but marked skin changes of venous hypertension PSYCHIATRIC: The patient has a normal affect.  DATA:  Lower from the duplex reveals reflux in his left common femoral vein, femoral vein and popliteal vein. Mild reflux in his great saphenous vein on the left with no significant dilatation. On the right he has reflux in the deep system only at the common femoral vein. He does have marked reflux throughout the great saphenous vein with significant dilatation throughout the saphenous vein as well  MEDICAL ISSUES: Had long discussion with the patient. Explained that does not have any risk for limb loss due to his normal arterial flow. Also explained that his right leg symptoms of venous hypertension and pain every varicosities could potentially be improved with ablation of his great saphenous vein. Explained that the field majority of his symptoms on the left leg are related to deep venous reflux and therefore would not be treatable with ablation. We have prescribed 20 30 mmHg graduated compression garments instruct him on the use of these. We will see him again in 3 months to determine if conservative therapy is being appropriate for him. I again explained the importance of elevation when possible and ibuprofen for discomfort    Rosetta Posner, MD [facility] Vascular and Vein Specialists of [facility] Tel [phone] Pager [phone]

## 2016-11-14 DIAGNOSIS — Z23 Encounter for immunization: Secondary | ICD-10-CM | POA: Diagnosis not present

## 2016-12-05 ENCOUNTER — Encounter (HOSPITAL_COMMUNITY): Payer: Medicare Other

## 2016-12-05 ENCOUNTER — Encounter: Payer: Medicare Other | Admitting: Vascular Surgery

## 2016-12-14 ENCOUNTER — Ambulatory Visit: Payer: Medicare Other | Admitting: Family

## 2016-12-14 ENCOUNTER — Encounter (HOSPITAL_COMMUNITY): Payer: Medicare Other

## 2016-12-14 DIAGNOSIS — Z85828 Personal history of other malignant neoplasm of skin: Secondary | ICD-10-CM | POA: Diagnosis not present

## 2016-12-14 DIAGNOSIS — D485 Neoplasm of uncertain behavior of skin: Secondary | ICD-10-CM | POA: Diagnosis not present

## 2016-12-14 DIAGNOSIS — C44629 Squamous cell carcinoma of skin of left upper limb, including shoulder: Secondary | ICD-10-CM | POA: Diagnosis not present

## 2016-12-14 DIAGNOSIS — Z85820 Personal history of malignant melanoma of skin: Secondary | ICD-10-CM | POA: Diagnosis not present

## 2016-12-14 DIAGNOSIS — L570 Actinic keratosis: Secondary | ICD-10-CM | POA: Diagnosis not present

## 2017-01-11 DIAGNOSIS — Z87898 Personal history of other specified conditions: Secondary | ICD-10-CM | POA: Diagnosis not present

## 2017-01-11 DIAGNOSIS — N400 Enlarged prostate without lower urinary tract symptoms: Secondary | ICD-10-CM | POA: Diagnosis not present

## 2017-01-11 DIAGNOSIS — Z0000 Encounter for general adult medical examination without abnormal findings: Secondary | ICD-10-CM | POA: Diagnosis not present

## 2017-01-11 DIAGNOSIS — H9193 Unspecified hearing loss, bilateral: Secondary | ICD-10-CM | POA: Diagnosis not present

## 2017-01-11 DIAGNOSIS — E782 Mixed hyperlipidemia: Secondary | ICD-10-CM | POA: Diagnosis not present

## 2017-01-11 DIAGNOSIS — J439 Emphysema, unspecified: Secondary | ICD-10-CM | POA: Diagnosis not present

## 2017-01-11 DIAGNOSIS — K5900 Constipation, unspecified: Secondary | ICD-10-CM | POA: Diagnosis not present

## 2017-01-11 DIAGNOSIS — G4700 Insomnia, unspecified: Secondary | ICD-10-CM | POA: Diagnosis not present

## 2017-01-11 DIAGNOSIS — Z1389 Encounter for screening for other disorder: Secondary | ICD-10-CM | POA: Diagnosis not present

## 2017-01-11 DIAGNOSIS — I739 Peripheral vascular disease, unspecified: Secondary | ICD-10-CM | POA: Diagnosis not present

## 2017-01-11 DIAGNOSIS — I700 Atherosclerosis of aorta: Secondary | ICD-10-CM | POA: Diagnosis not present

## 2017-01-25 DIAGNOSIS — H6122 Impacted cerumen, left ear: Secondary | ICD-10-CM | POA: Diagnosis not present

## 2017-01-26 DIAGNOSIS — H903 Sensorineural hearing loss, bilateral: Secondary | ICD-10-CM | POA: Diagnosis not present

## 2017-02-13 ENCOUNTER — Ambulatory Visit (HOSPITAL_COMMUNITY)
Admission: RE | Admit: 2017-02-13 | Discharge: 2017-02-13 | Disposition: A | Discharge status: Home / Self Care | Payer: Medicare Other | Source: Ambulatory Visit | Attending: Family | Admitting: Family

## 2017-02-13 ENCOUNTER — Ambulatory Visit (INDEPENDENT_AMBULATORY_CARE_PROVIDER_SITE_OTHER): Payer: Medicare Other | Admitting: Vascular Surgery

## 2017-02-13 ENCOUNTER — Encounter: Payer: Self-pay | Admitting: Vascular Surgery

## 2017-02-13 VITALS — BP 151/95 | HR 75 | Temp 97.9°F | Resp 20 | Ht 68.0 in | Wt 155.0 lb

## 2017-02-13 DIAGNOSIS — I83813 Varicose veins of bilateral lower extremities with pain: Secondary | ICD-10-CM

## 2017-02-13 DIAGNOSIS — I6522 Occlusion and stenosis of left carotid artery: Secondary | ICD-10-CM | POA: Insufficient documentation

## 2017-02-13 DIAGNOSIS — I6523 Occlusion and stenosis of bilateral carotid arteries: Secondary | ICD-10-CM | POA: Diagnosis not present

## 2017-02-13 DIAGNOSIS — Z9889 Other specified postprocedural states: Secondary | ICD-10-CM | POA: Insufficient documentation

## 2017-02-13 DIAGNOSIS — I6521 Occlusion and stenosis of right carotid artery: Secondary | ICD-10-CM | POA: Diagnosis not present

## 2017-02-13 LAB — VAS US CAROTID
LCCADDIAS: -28 cm/s
LEFT ECA DIAS: -17 cm/s
LEFT VERTEBRAL DIAS: 22 cm/s
LICADDIAS: -22 cm/s
LICAPDIAS: 19 cm/s
LICAPSYS: 82 cm/s
Left CCA dist sys: -118 cm/s
Left CCA prox dias: 23 cm/s
Left CCA prox sys: 86 cm/s
Left ICA dist sys: -67 cm/s
RCCAPDIAS: 14 cm/s
RIGHT CCA MID DIAS: -12 cm/s
RIGHT ECA DIAS: -20 cm/s
RIGHT VERTEBRAL DIAS: -9 cm/s
Right CCA prox sys: 84 cm/s
Right cca dist sys: -61 cm/s

## 2017-02-13 NOTE — Progress Notes (Signed)
HISTORY AND PHYSICAL     CC:  Follow up carotid  Requesting Provider:  Antony Contras, MD  HPI: This is a 84 y.o. male who underwent right carotid endarterectomy on 03/05/09 by Dr. Kellie Simmering.  He states that he is doing well and denies any stroke type symptoms with weakness/numbness on either side of the body, speech difficulty or temporary blindness.  He does continue to have some tingling in his right shoulder, which was ongoing when he was here in 2017.  He feels this happens due to the way he lays at night.  It does not happen every night.   He was last seen by Dr. Donnetta Hutching in October for venous hypertension.  He was advised to wear 20-34mmHg compression socks, but he states he did not tolerate these and he does wear 66mmHg compression.  He states he does get some relief from this.   He does still have swelling in both legs around his ankles.   The pt has never smoked and does not drink alcohol.    Past Medical History:  Diagnosis Date  . Aortic atherosclerosis ([place])   . Bilateral hearing loss   . BPH (benign prostatic hyperplasia)   . Cancer ([place])    basal cell carcinoma  . Carotid artery occlusion   . Constipation   . Coronary artery disease   . Emphysema lung ([place])   . Fatty liver   . GERD (gastroesophageal reflux disease)   . History of echocardiogram    Echo 4/17: Vigorous LVEF, EF 65-70%, normal wall motion, grade 1 diastolic dysfunction, mildly dilated ascending aorta (40 mm)  . Hyperlipidemia   . Hypertension   . Insomnia   . PVD (peripheral vascular disease) ([place])   . Thyroid nodule     Past Surgical History:  Procedure Laterality Date  . APPENDECTOMY    . CAROTID ENDARTERECTOMY  03-05-09   right CEA  . COLONOSCOPY  Oct. 2013   one polyp  . HERNIA REPAIR    . LIPOMA EXCISION      Allergies  Allergen Reactions  . Crestor [Rosuvastatin] Other (See Comments)    Myalgia   . Lipitor [Atorvastatin] Other (See Comments)    Liver inflammation   . Viagra [Sildenafil]      Current Outpatient Medications  Medication Sig Dispense Refill  . aspirin EC 81 MG tablet Take 81 mg by mouth daily.     . Calcium Carbonate (CALTRATE 600 PO) Take by mouth daily.    . Cholecalciferol (VITAMIN D-3 PO) Take 1 tablet by mouth daily.    . Coenzyme Q10 (COQ-10) 100 MG CAPS Take by mouth daily.    . cyanocobalamin 100 MCG tablet Take 100 mcg by mouth daily.    . Multiple Vitamin (MULTIVITAMIN) tablet Take 1 tablet by mouth daily.    . simvastatin (ZOCOR) 10 MG tablet Take 10 mg by mouth daily.    . traZODone (DESYREL) 50 MG tablet Take 50 mg by mouth at bedtime.    Marland Kitchen zolpidem (AMBIEN) 5 MG tablet Take 5 mg by mouth at bedtime as needed for sleep.      No current facility-administered medications for this visit.     Family History  Problem Relation Age of Onset  . Heart disease Sister   . Cancer Sister   . Hypertension Sister   . Heart attack Sister   . Heart disease Brother   . Stroke Brother   . Hypertension Brother   . Heart disease Brother   .  Hyperlipidemia Son     Social History   Socioeconomic History  . Marital status: Married    Spouse name: Not on file  . Number of children: Not on file  . Years of education: Not on file  . Highest education level: Not on file  Social Needs  . Financial resource strain: Not on file  . Food insecurity - worry: Not on file  . Food insecurity - inability: Not on file  . Transportation needs - medical: Not on file  . Transportation needs - non-medical: Not on file  Occupational History  . Not on file  Tobacco Use  . Smoking status: Never Smoker  . Smokeless tobacco: Never Used  Substance and Sexual Activity  . Alcohol use: No  . Drug use: No  . Sexual activity: Not on file  Other Topics Concern  . Not on file  Social History Narrative  . Not on file     REVIEW OF SYSTEMS:   [X]  denotes positive finding, [ ]  denotes negative finding Cardiac  Comments:  Chest pain or chest pressure:    Shortness of  breath upon exertion:    Short of breath when lying flat:    Irregular heart rhythm:        Vascular    Pain in calf, thigh, or hip brought on by ambulation:    Pain in feet at night that wakes you up from your sleep:     Blood clot in your veins:    Leg swelling:  x       Pulmonary    Oxygen at home:    Productive cough:     Wheezing:         Neurologic    Sudden weakness in arms or legs:     Sudden numbness in arms or legs:     Sudden onset of difficulty speaking or slurred speech:    Temporary loss of vision in one eye:     Problems with dizziness:         Gastrointestinal    Blood in stool:     Vomited blood:         Genitourinary    Burning when urinating:     Blood in urine:        Psychiatric    Major depression:         Hematologic    Bleeding problems:    Problems with blood clotting too easily:        Skin    Rashes or ulcers:        Constitutional    Fever or chills:      PHYSICAL EXAMINATION:  Vitals:   02/13/17 1340 02/13/17 1342  BP: (!) 139/101 (!) 151/95  Pulse: 75   Resp: 20   Temp: 97.9 F (36.6 C)   SpO2: 95%    Body mass index is 23.57 kg/m.  General:  WDWN in NAD; vital signs documented above Gait: Not observed HENT: WNL, normocephalic Pulmonary: normal non-labored breathing , without Rales, rhonchi,  wheezing Cardiac: regular HR, without  Murmurs, rubs or gallops; without carotid bruit s Abdomen: soft, NT, no masses Skin: without rashes Vascular Exam/Pulses:  Right Left  Radial 2+ (normal) 2+ (normal)  Ulnar Unable to palpate  Unable to palpate   DP 2+ (normal) 2+ (normal)  PT Unable to palpate  Unable to palpate   Extremities: venous hypertension changes to bilateral legs; +varicosities present Right>Left; 1+edema BLE Musculoskeletal: no muscle wasting or atrophy  Neurologic: A&O X 3;  No focal weakness or paresthesias are detected Psychiatric:  The pt has Normal affect.   Non-Invasive Vascular Imaging:   Carotid  Duplex on 02/13/17: -patent right CEA with no right internal carotid artery stenosis -1-39% left ICA stenosis **No change since 12/13/15 exam  Pt meds includes: Statin:  Yes.   Beta Blocker:  No. Aspirin:  Yes. ACEI:  No. ARB:  No. CCB use:  No Other Antiplatelet/Anticoagulant:  No   ASSESSMENT/PLAN:: 84 y.o. male who is s/p right carotid endarterectomy by Dr. Kellie Simmering in 2011 who presents for follow up.   -pt has done well and remains asymptomatic.  His carotid duplex is unchanged from a year ago. We will have him return in one year with NP and carotid duplex.  Sx of stroke were discussed with pt and his wife-should he have any of these, he will contact us sooner or present to the hospital. -as far as his venous insufficiency, the pt did not tolerate the knee high 20-55mmHg compression socks and is wearing 72mmHg compression socks.  He is getting relief with these, but states he will try the 45mmHg again.  He will contact us if he has additional problems   Leontine Locket, PA-C [facility] [phone]  Clinic MD:  Pt seen and examined with Dr. Donnetta Hutching  I have examined the patient, reviewed and agree with above.  Curt Jews, MD 02/13/2017 4:53 PM

## 2017-03-15 DIAGNOSIS — C44529 Squamous cell carcinoma of skin of other part of trunk: Secondary | ICD-10-CM | POA: Diagnosis not present

## 2017-03-15 DIAGNOSIS — C44619 Basal cell carcinoma of skin of left upper limb, including shoulder: Secondary | ICD-10-CM | POA: Diagnosis not present

## 2017-03-15 DIAGNOSIS — Z85828 Personal history of other malignant neoplasm of skin: Secondary | ICD-10-CM | POA: Diagnosis not present

## 2017-03-15 DIAGNOSIS — C44629 Squamous cell carcinoma of skin of left upper limb, including shoulder: Secondary | ICD-10-CM | POA: Diagnosis not present

## 2017-03-15 DIAGNOSIS — D485 Neoplasm of uncertain behavior of skin: Secondary | ICD-10-CM | POA: Diagnosis not present

## 2017-03-15 DIAGNOSIS — Z85820 Personal history of malignant melanoma of skin: Secondary | ICD-10-CM | POA: Diagnosis not present

## 2017-03-15 DIAGNOSIS — L821 Other seborrheic keratosis: Secondary | ICD-10-CM | POA: Diagnosis not present

## 2017-03-15 DIAGNOSIS — L570 Actinic keratosis: Secondary | ICD-10-CM | POA: Diagnosis not present

## 2017-06-20 DIAGNOSIS — L821 Other seborrheic keratosis: Secondary | ICD-10-CM | POA: Diagnosis not present

## 2017-06-20 DIAGNOSIS — C44519 Basal cell carcinoma of skin of other part of trunk: Secondary | ICD-10-CM | POA: Diagnosis not present

## 2017-06-20 DIAGNOSIS — Z85820 Personal history of malignant melanoma of skin: Secondary | ICD-10-CM | POA: Diagnosis not present

## 2017-06-20 DIAGNOSIS — D045 Carcinoma in situ of skin of trunk: Secondary | ICD-10-CM | POA: Diagnosis not present

## 2017-06-20 DIAGNOSIS — L578 Other skin changes due to chronic exposure to nonionizing radiation: Secondary | ICD-10-CM | POA: Diagnosis not present

## 2017-06-20 DIAGNOSIS — L570 Actinic keratosis: Secondary | ICD-10-CM | POA: Diagnosis not present

## 2017-06-20 DIAGNOSIS — D485 Neoplasm of uncertain behavior of skin: Secondary | ICD-10-CM | POA: Diagnosis not present

## 2017-06-20 DIAGNOSIS — D2261 Melanocytic nevi of right upper limb, including shoulder: Secondary | ICD-10-CM | POA: Diagnosis not present

## 2017-06-20 DIAGNOSIS — Z85828 Personal history of other malignant neoplasm of skin: Secondary | ICD-10-CM | POA: Diagnosis not present

## 2017-08-24 DIAGNOSIS — K5900 Constipation, unspecified: Secondary | ICD-10-CM | POA: Diagnosis not present

## 2017-08-24 DIAGNOSIS — H9193 Unspecified hearing loss, bilateral: Secondary | ICD-10-CM | POA: Diagnosis not present

## 2017-08-24 DIAGNOSIS — Z87898 Personal history of other specified conditions: Secondary | ICD-10-CM | POA: Diagnosis not present

## 2017-08-24 DIAGNOSIS — I739 Peripheral vascular disease, unspecified: Secondary | ICD-10-CM | POA: Diagnosis not present

## 2017-08-24 DIAGNOSIS — E782 Mixed hyperlipidemia: Secondary | ICD-10-CM | POA: Diagnosis not present

## 2017-08-24 DIAGNOSIS — N400 Enlarged prostate without lower urinary tract symptoms: Secondary | ICD-10-CM | POA: Diagnosis not present

## 2017-08-24 DIAGNOSIS — I700 Atherosclerosis of aorta: Secondary | ICD-10-CM | POA: Diagnosis not present

## 2017-08-24 DIAGNOSIS — D692 Other nonthrombocytopenic purpura: Secondary | ICD-10-CM | POA: Diagnosis not present

## 2017-08-24 DIAGNOSIS — G4700 Insomnia, unspecified: Secondary | ICD-10-CM | POA: Diagnosis not present

## 2017-08-24 DIAGNOSIS — J439 Emphysema, unspecified: Secondary | ICD-10-CM | POA: Diagnosis not present

## 2017-08-28 ENCOUNTER — Ambulatory Visit (INDEPENDENT_AMBULATORY_CARE_PROVIDER_SITE_OTHER): Payer: Medicare Other | Admitting: Family

## 2017-08-28 ENCOUNTER — Encounter: Payer: Self-pay | Admitting: Family

## 2017-08-28 ENCOUNTER — Encounter (HOSPITAL_COMMUNITY): Payer: Medicare Other

## 2017-08-28 ENCOUNTER — Other Ambulatory Visit: Payer: Self-pay

## 2017-08-28 VITALS — BP 130/87 | HR 102 | Temp 97.6°F | Resp 16 | Ht 68.0 in | Wt 159.2 lb

## 2017-08-28 DIAGNOSIS — Z9889 Other specified postprocedural states: Secondary | ICD-10-CM

## 2017-08-28 DIAGNOSIS — M79621 Pain in right upper arm: Secondary | ICD-10-CM

## 2017-08-28 DIAGNOSIS — I8393 Asymptomatic varicose veins of bilateral lower extremities: Secondary | ICD-10-CM

## 2017-08-28 DIAGNOSIS — I6523 Occlusion and stenosis of bilateral carotid arteries: Secondary | ICD-10-CM

## 2017-08-28 NOTE — Patient Instructions (Signed)
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
## 2017-08-30 ENCOUNTER — Ambulatory Visit (INDEPENDENT_AMBULATORY_CARE_PROVIDER_SITE_OTHER): Payer: Medicare Other

## 2017-08-30 ENCOUNTER — Ambulatory Visit (HOSPITAL_COMMUNITY)
Admission: EM | Admit: 2017-08-30 | Discharge: 2017-08-30 | Disposition: A | Discharge status: Home / Self Care | Payer: Medicare Other | Attending: Family Medicine | Admitting: Family Medicine

## 2017-08-30 ENCOUNTER — Encounter (HOSPITAL_COMMUNITY): Payer: Self-pay

## 2017-08-30 DIAGNOSIS — M25562 Pain in left knee: Secondary | ICD-10-CM

## 2017-08-30 DIAGNOSIS — M79652 Pain in left thigh: Secondary | ICD-10-CM

## 2017-08-30 DIAGNOSIS — S8992XA Unspecified injury of left lower leg, initial encounter: Secondary | ICD-10-CM | POA: Diagnosis not present
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
## 2017-08-30 NOTE — Discharge Instructions (Addendum)
It was nice meeting you!!  Your x-ray was negative for any fractures. I believe this is just a bad bruise and some sore muscles that will take some time to heal. If you develop any worsening symptoms or the pain does not get better within the next week follow-up with your primary care provider. You can take Tylenol for pain and try ice to the knee that could help.

## 2017-09-14 DIAGNOSIS — R350 Frequency of micturition: Secondary | ICD-10-CM | POA: Diagnosis not present

## 2017-09-14 DIAGNOSIS — R2681 Unsteadiness on feet: Secondary | ICD-10-CM | POA: Diagnosis not present

## 2017-09-14 DIAGNOSIS — M5412 Radiculopathy, cervical region: Secondary | ICD-10-CM | POA: Diagnosis not present

## 2017-09-14 DIAGNOSIS — N401 Enlarged prostate with lower urinary tract symptoms: Secondary | ICD-10-CM | POA: Diagnosis not present

## 2017-09-14 DIAGNOSIS — R351 Nocturia: Secondary | ICD-10-CM | POA: Diagnosis not present

## 2017-09-14 DIAGNOSIS — M79602 Pain in left arm: Secondary | ICD-10-CM | POA: Diagnosis not present

## 2017-09-25 DIAGNOSIS — M25511 Pain in right shoulder: Secondary | ICD-10-CM | POA: Diagnosis not present

## 2017-10-26 DIAGNOSIS — R3912 Poor urinary stream: Secondary | ICD-10-CM | POA: Diagnosis not present

## 2017-10-26 DIAGNOSIS — N401 Enlarged prostate with lower urinary tract symptoms: Secondary | ICD-10-CM | POA: Diagnosis not present

## 2017-10-26 DIAGNOSIS — N3000 Acute cystitis without hematuria: Secondary | ICD-10-CM | POA: Diagnosis not present

## 2017-10-26 DIAGNOSIS — R3915 Urgency of urination: Secondary | ICD-10-CM | POA: Diagnosis not present

## 2017-11-02 DIAGNOSIS — Z23 Encounter for immunization: Secondary | ICD-10-CM | POA: Diagnosis not present

## 2017-11-07 ENCOUNTER — Encounter (HOSPITAL_BASED_OUTPATIENT_CLINIC_OR_DEPARTMENT_OTHER): Payer: Self-pay

## 2017-11-07 ENCOUNTER — Other Ambulatory Visit: Payer: Self-pay

## 2017-11-07 ENCOUNTER — Emergency Department (HOSPITAL_BASED_OUTPATIENT_CLINIC_OR_DEPARTMENT_OTHER): Payer: Medicare Other

## 2017-11-07 ENCOUNTER — Emergency Department (HOSPITAL_BASED_OUTPATIENT_CLINIC_OR_DEPARTMENT_OTHER)
Admission: EM | Admit: 2017-11-07 | Discharge: 2017-11-07 | Disposition: A | Discharge status: Home / Self Care | Payer: Medicare Other | Attending: Emergency Medicine | Admitting: Emergency Medicine

## 2017-11-07 DIAGNOSIS — I824Y1 Acute embolism and thrombosis of unspecified deep veins of right proximal lower extremity: Secondary | ICD-10-CM | POA: Insufficient documentation

## 2017-11-07 DIAGNOSIS — I2510 Atherosclerotic heart disease of native coronary artery without angina pectoris: Secondary | ICD-10-CM | POA: Insufficient documentation

## 2017-11-07 DIAGNOSIS — I10 Essential (primary) hypertension: Secondary | ICD-10-CM | POA: Insufficient documentation

## 2017-11-07 DIAGNOSIS — I82441 Acute embolism and thrombosis of right tibial vein: Secondary | ICD-10-CM | POA: Diagnosis not present

## 2017-11-07 DIAGNOSIS — I82401 Acute embolism and thrombosis of unspecified deep veins of right lower extremity: Secondary | ICD-10-CM | POA: Diagnosis not present

## 2017-11-07 DIAGNOSIS — Z79899 Other long term (current) drug therapy: Secondary | ICD-10-CM | POA: Diagnosis not present

## 2017-11-07 DIAGNOSIS — Z7982 Long term (current) use of aspirin: Secondary | ICD-10-CM | POA: Insufficient documentation

## 2017-11-07 DIAGNOSIS — I82431 Acute embolism and thrombosis of right popliteal vein: Secondary | ICD-10-CM | POA: Diagnosis not present

## 2017-11-07 DIAGNOSIS — R2241 Localized swelling, mass and lump, right lower limb: Secondary | ICD-10-CM | POA: Diagnosis present

## 2017-11-07 DIAGNOSIS — I82411 Acute embolism and thrombosis of right femoral vein: Secondary | ICD-10-CM | POA: Diagnosis not present

## 2017-11-07 LAB — CBC WITH DIFFERENTIAL/PLATELET
BASOS PCT: 0 %
Basophils Absolute: 0 10*3/uL (ref 0.0–0.1)
EOS ABS: 0.1 10*3/uL (ref 0.0–0.7)
Eosinophils Relative: 1 %
HCT: 43.2 % (ref 39.0–52.0)
Hemoglobin: 14.6 g/dL (ref 13.0–17.0)
LYMPHS ABS: 1 10*3/uL (ref 0.7–4.0)
Lymphocytes Relative: 11 %
MCH: 33.6 pg (ref 26.0–34.0)
MCHC: 33.8 g/dL (ref 30.0–36.0)
MCV: 99.5 fL (ref 78.0–100.0)
MONO ABS: 0.7 10*3/uL (ref 0.1–1.0)
Monocytes Relative: 8 %
NEUTROS PCT: 80 %
Neutro Abs: 7.5 10*3/uL (ref 1.7–7.7)
Platelets: 139 10*3/uL — ABNORMAL LOW (ref 150–400)
RBC: 4.34 MIL/uL (ref 4.22–5.81)
RDW: 13.3 % (ref 11.5–15.5)
WBC: 9.3 10*3/uL (ref 4.0–10.5)

## 2017-11-07 LAB — COMPREHENSIVE METABOLIC PANEL
ALK PHOS: 115 U/L (ref 38–126)
ALT: 46 U/L — AB (ref 0–44)
AST: 40 U/L (ref 15–41)
Albumin: 3.3 g/dL — ABNORMAL LOW (ref 3.5–5.0)
Anion gap: 8 (ref 5–15)
BILIRUBIN TOTAL: 1 mg/dL (ref 0.3–1.2)
BUN: 25 mg/dL — AB (ref 8–23)
CALCIUM: 8.9 mg/dL (ref 8.9–10.3)
CHLORIDE: 104 mmol/L (ref 98–111)
CO2: 27 mmol/L (ref 22–32)
CREATININE: 0.87 mg/dL (ref 0.61–1.24)
Glucose, Bld: 120 mg/dL — ABNORMAL HIGH (ref 70–99)
Potassium: 3.7 mmol/L (ref 3.5–5.1)
Sodium: 139 mmol/L (ref 135–145)
Total Protein: 6.2 g/dL — ABNORMAL LOW (ref 6.5–8.1)

## 2017-11-07 MED ORDER — RIVAROXABAN 20 MG PO TABS: 20.0000 mg | ORAL_TABLET | Freq: Every day | ORAL | Status: DC

## 2017-11-07 MED ORDER — RIVAROXABAN (XARELTO) VTE STARTER PACK (15 & 20 MG): ORAL_TABLET | ORAL | 0 refills | Status: DC

## 2017-11-07 MED ORDER — RIVAROXABAN (XARELTO) EDUCATION KIT FOR DVT/PE PATIENTS
PACK | Freq: Once | Status: AC
  Administered 2017-11-07: 16:00:00

## 2017-11-07 MED ORDER — RIVAROXABAN 15 MG PO TABS
15.0000 mg | ORAL_TABLET | Freq: Two times a day (BID) | ORAL | Status: DC
  Administered 2017-11-07: 15 mg via ORAL

## 2017-11-07 MED ORDER — RIVAROXABAN 15 MG PO TABS
15.0000 mg | ORAL_TABLET | Freq: Two times a day (BID) | ORAL | Status: DC
  Filled 2017-11-07: qty 1

## 2017-11-07 NOTE — Discharge Instructions (Addendum)
Information on my medicine - XARELTO (rivaroxaban)  This medication education was reviewed with me or my healthcare representative as part of my discharge preparation.  The pharmacist that spoke with me during my hospital stay was:  Bertis Ruddy, [place]? Xarelto was prescribed to treat blood clots that may have been found in the veins of your legs (deep vein thrombosis) or in your lungs (pulmonary embolism) and to reduce the risk of them occurring again.  What do you need to know about Xarelto? The starting dose is one 15 mg tablet taken TWICE daily with food for the FIRST 21 DAYS then on 11/28/2017  the dose is changed to one 20 mg tablet taken ONCE A DAY with your evening meal.  DO NOT stop taking Xarelto without talking to the health care provider who prescribed the medication.  Refill your prescription for 20 mg tablets before you run out.  After discharge, you should have regular check-up appointments with your healthcare provider that is prescribing your Xarelto.  In the future your dose may need to be changed if your kidney function changes by a significant amount.  What do you do if you miss a dose? If you are taking Xarelto TWICE DAILY and you miss a dose, take it as soon as you remember. You may take two 15 mg tablets (total 30 mg) at the same time then resume your regularly scheduled 15 mg twice daily the next day.  If you are taking Xarelto ONCE DAILY and you miss a dose, take it as soon as you remember on the same day then continue your regularly scheduled once daily regimen the next day. Do not take two doses of Xarelto at the same time.   Important Safety Information Xarelto is a blood thinner medicine that can cause bleeding. You should call your healthcare provider right away if you experience any of the following: ? Bleeding from an injury or your nose that does not stop. ? Unusual colored urine (red or dark brown) or unusual  colored stools (red or black). ? Unusual bruising for unknown reasons. ? A serious fall or if you hit your head (even if there is no bleeding).  Some medicines may interact with Xarelto and might increase your risk of bleeding while on Xarelto. To help avoid this, consult your healthcare provider or pharmacist prior to using any new prescription or non-prescription medications, including herbals, vitamins, non-steroidal anti-inflammatory drugs (NSAIDs) and supplements.  This website has more information on Xarelto: [email or web address]

## 2017-11-07 NOTE — ED Notes (Signed)
ED Provider at bedside. 
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
## 2017-11-07 NOTE — Progress Notes (Signed)
ANTICOAGULATION CONSULT NOTE - Initial Consult  Pharmacy Consult for Xarelto Indication: DVT  Allergies  Allergen Reactions  . Crestor [Rosuvastatin] Other (See Comments)    Myalgia   . Lipitor [Atorvastatin] Other (See Comments)    Liver inflammation   . Viagra [Sildenafil]     Patient Measurements: Height: 5' 7"  (170.2 cm) Weight: 143 lb (64.9 kg) IBW/kg (Calculated) : 66.1   Vital Signs: Temp: 98.4 F (36.9 C) (10/02 1220) Temp Source: Oral (10/02 1220) BP: 143/87 (10/02 1220) Pulse Rate: 116 (10/02 1220)  Labs: No results for input(s): HGB, HCT, PLT, APTT, LABPROT, INR, HEPARINUNFRC, HEPRLOWMOCWT, CREATININE, CKTOTAL, CKMB, TROPONINI in the last 72 hours.  CrCl cannot be calculated (Patient's most recent lab result is older than the maximum 21 days allowed.).   Medical History: Past Medical History:  Diagnosis Date  . Aortic atherosclerosis ([place])   . Bilateral hearing loss   . BPH (benign prostatic hyperplasia)   . Cancer ([place])    basal cell carcinoma  . Carotid artery occlusion   . Constipation   . Coronary artery disease   . Emphysema lung ([place])   . Fatty liver   . GERD (gastroesophageal reflux disease)   . History of echocardiogram    Echo 4/17: Vigorous LVEF, EF 65-70%, normal wall motion, grade 1 diastolic dysfunction, mildly dilated ascending aorta (40 mm)  . Hyperlipidemia   . Hypertension   . Insomnia   . PVD (peripheral vascular disease) ([place])   . Thyroid nodule     Medications:  n/a Assessment: 58 YOM presenting with leg swelling, DVT found on doppler.  PTA bASA only, no anticoagulation.   Goal of Therapy:  Monitor platelets by anticoagulation protocol: Yes   Plan:  Xarelto 64m PO BID x 21 days, then 222mPO once daily thereafter  Xarelto patient education kit  JoBertis RuddyPharmD Clinical Pharmacist Please check AMION for all [place] 11/07/2017 2:44 PM

## 2017-11-07 NOTE — ED Triage Notes (Signed)
C/o swelling to right LE that started last night-denies injury-to triage in w/c-NAD

## 2017-11-13 DIAGNOSIS — I82401 Acute embolism and thrombosis of unspecified deep veins of right lower extremity: Secondary | ICD-10-CM | POA: Diagnosis not present

## 2017-11-13 DIAGNOSIS — J432 Centrilobular emphysema: Secondary | ICD-10-CM | POA: Diagnosis not present

## 2017-11-13 DIAGNOSIS — E782 Mixed hyperlipidemia: Secondary | ICD-10-CM | POA: Diagnosis not present

## 2017-11-16 ENCOUNTER — Other Ambulatory Visit: Payer: Self-pay

## 2017-11-16 ENCOUNTER — Emergency Department (HOSPITAL_BASED_OUTPATIENT_CLINIC_OR_DEPARTMENT_OTHER)
Admission: EM | Admit: 2017-11-16 | Discharge: 2017-11-16 | Disposition: A | Discharge status: Home / Self Care | Payer: Medicare Other | Attending: Emergency Medicine | Admitting: Emergency Medicine

## 2017-11-16 ENCOUNTER — Encounter (HOSPITAL_BASED_OUTPATIENT_CLINIC_OR_DEPARTMENT_OTHER): Payer: Self-pay

## 2017-11-16 DIAGNOSIS — I10 Essential (primary) hypertension: Secondary | ICD-10-CM | POA: Diagnosis not present

## 2017-11-16 DIAGNOSIS — R2241 Localized swelling, mass and lump, right lower limb: Secondary | ICD-10-CM | POA: Insufficient documentation

## 2017-11-16 DIAGNOSIS — Z7982 Long term (current) use of aspirin: Secondary | ICD-10-CM | POA: Insufficient documentation

## 2017-11-16 DIAGNOSIS — I2510 Atherosclerotic heart disease of native coronary artery without angina pectoris: Secondary | ICD-10-CM | POA: Diagnosis not present

## 2017-11-16 DIAGNOSIS — Z79899 Other long term (current) drug therapy: Secondary | ICD-10-CM | POA: Diagnosis not present

## 2017-11-16 DIAGNOSIS — I824Y1 Acute embolism and thrombosis of unspecified deep veins of right proximal lower extremity: Secondary | ICD-10-CM | POA: Diagnosis not present

## 2017-11-16 HISTORY — DX: Acute embolism and thrombosis of unspecified deep veins of unspecified lower extremity: I82.409

## 2017-11-16 NOTE — ED Triage Notes (Addendum)
Pt dx with right LE DVT on 10/2-states he did f/u with PCP this week-no changes in tx-pt states LE is worse with increase swelling-NAD-to triage in w/c

## 2017-11-16 NOTE — ED Notes (Signed)
ED Provider at bedside. 

## 2017-11-16 NOTE — Discharge Instructions (Addendum)
Continue Xarelto as previously prescribed.  You may wear support hose for control of swelling.  Follow-up in the vascular surgery clinic in 1-2 weeks.  The contact information for Dr. Donzetta Matters has been provided in this discharge summary for you to call and make these arrangements.

## 2017-11-16 NOTE — ED Provider Notes (Signed)
[facility] EMERGENCY DEPARTMENT Provider Note   CSN: [id number] Arrival date & time: 11/16/17  1204     History   Chief Complaint No chief complaint on file.   HPI William Mann is a 84 y.o. male.  Patient is a 84 year old male with past medical history aortic atherosclerosis, coronary artery disease, and recently diagnosed DVT.  This was found 1 week ago and he was started on Xarelto.  Since then his leg has remained swollen with some discoloration to the lateral aspect of the foot.  He denies any chest pain or difficulty breathing.  He returns over concerns that the swelling is not improving.  The history is provided by the patient.    Past Medical History:  Diagnosis Date  . Aortic atherosclerosis ([place])   . Bilateral hearing loss   . BPH (benign prostatic hyperplasia)   . Cancer ([place])    basal cell carcinoma  . Carotid artery occlusion   . Constipation   . Coronary artery disease   . DVT (deep venous thrombosis) ([place])   . Emphysema lung ([place])   . Fatty liver   . GERD (gastroesophageal reflux disease)   . History of echocardiogram    Echo 4/17: Vigorous LVEF, EF 65-70%, normal wall motion, grade 1 diastolic dysfunction, mildly dilated ascending aorta (40 mm)  . Hyperlipidemia   . Hypertension   . Insomnia   . PVD (peripheral vascular disease) ([place])   . Thyroid nodule     Patient Active Problem List   Diagnosis Date Noted  . Aftercare following surgery of the circulatory system 02/17/2014  . Bilateral carotid artery occlusion 02/17/2014  . Aftercare following surgery of the circulatory system, [place] 01/17/2013  . Numbness and tingling 02/27/2012  . Peripheral vascular disease, unspecified ([place]) 02/27/2012  . Occlusion and stenosis of carotid artery without mention of cerebral infarction 03/14/2011    Past Surgical History:  Procedure Laterality Date  . APPENDECTOMY    . CAROTID ENDARTERECTOMY  03-05-09   right CEA  . COLONOSCOPY  Oct. 2013   one  polyp  . HERNIA REPAIR    . LIPOMA EXCISION          Home Medications    Prior to Admission medications   Medication Sig Start Date End Date Taking? Authorizing Provider  aspirin EC 81 MG tablet Take 81 mg by mouth daily.     [provider]  Calcium Carbonate (CALTRATE 600 PO) Take by mouth daily.    [provider]  Cholecalciferol (VITAMIN William-3 PO) Take 1 tablet by mouth daily.    [provider]  Coenzyme Q10 (COQ-10) 100 MG CAPS Take by mouth daily.    [provider]  cyanocobalamin 100 MCG tablet Take 100 mcg by mouth daily.    [provider]  Multiple Vitamin (MULTIVITAMIN) tablet Take 1 tablet by mouth daily.    [provider]  Rivaroxaban 15 & 20 MG TBPK Take as directed on package: Start with one 15mg  tablet by mouth twice a day with food. On Day 22, switch to one 20mg  tablet once a day with food. 11/07/17   Gareth Morgan, MD  simvastatin (ZOCOR) 10 MG tablet Take 10 mg by mouth daily.    [provider]  traZODone (DESYREL) 50 MG tablet Take 50 mg by mouth at bedtime.    [provider]  zolpidem (AMBIEN) 5 MG tablet Take 5 mg by mouth at bedtime as needed for sleep.  [provider]    Family History Family History  Problem Relation Age of Onset  . Heart disease Sister   . Cancer Sister   . Hypertension Sister   . Heart attack Sister   . Heart disease Brother   . Stroke Brother   . Hypertension Brother   . Heart disease Brother   . Hyperlipidemia Son     Social History Social History   Tobacco Use  . Smoking status: Never Smoker  . Smokeless tobacco: Never Used  Substance Use Topics  . Alcohol use: No  . Drug use: No     Allergies   Crestor [rosuvastatin]; Lipitor [atorvastatin]; and Viagra [sildenafil]   Review of Systems Review of Systems  All other systems reviewed and are negative.    Physical Exam Updated Vital Signs BP 140/77 (BP Location: Left Arm)    Pulse 92   Temp 98.5 F (36.9 C) (Oral)   Resp 16   SpO2 98%   Physical Exam  Constitutional: He is oriented to person, place, and time. He appears well-developed and well-nourished. No distress.  HENT:  Head: Normocephalic and atraumatic.  Mouth/Throat: Oropharynx is clear and moist.  Neck: Normal range of motion. Neck supple.  Cardiovascular: Normal rate and regular rhythm. Exam reveals no friction rub.  No murmur heard. Pulmonary/Chest: Effort normal and breath sounds normal. No respiratory distress. He has no wheezes. He has no rales.  Abdominal: Soft. Bowel sounds are normal. He exhibits no distension. There is no tenderness.  Musculoskeletal: Normal range of motion. He exhibits edema.  There is 2+ pitting edema of the right lower extremity.  DP pulses are easily palpable.  There is some ecchymosis to the dorsum of the foot with multiple underlying varicosities.  Neurological: He is alert and oriented to person, place, and time. Coordination normal.  Skin: Skin is warm and dry. He is not diaphoretic.  Nursing note and vitals reviewed.    ED Treatments / Results  Labs (all labs ordered are listed, but only abnormal results are displayed) Labs Reviewed - No data to display  EKG None  Radiology No results found.  Procedures Procedures (including critical care time)  Medications Ordered in ED Medications - No data to display   Initial Impression / Assessment and Plan / ED Course  I have reviewed the triage vital signs and the nursing notes.  Pertinent labs & imaging results that were available during my care of the patient were reviewed by me and considered in my medical decision making (see chart for details).  Patient presents with complaints of right leg swelling.  He was diagnosed with a DVT 1 week ago and has been on Xarelto since that time.  He is concerned because the swelling has not improved.  He denies any chest pain or difficulty breathing.  He denies any  fever or redness.  Patient's ultrasound was reviewed and shows extensive clot throughout multiple veins in the leg.  I have discussed this with Dr. Donzetta Matters from vascular surgery who does not feel as though any other intervention is indicated.  Patient will be discharged with support hose, continued Xarelto, and follow-up in the vascular clinic in the next 1 to 2 weeks.  Final Clinical Impressions(s) / ED Diagnoses   Final diagnoses:  None    ED Discharge Orders    None       Veryl Speak, MD 11/16/17 1321

## 2017-11-27 ENCOUNTER — Encounter: Payer: Self-pay | Admitting: Family

## 2017-11-27 ENCOUNTER — Ambulatory Visit (INDEPENDENT_AMBULATORY_CARE_PROVIDER_SITE_OTHER): Payer: Medicare Other | Admitting: Family

## 2017-11-27 VITALS — BP 133/89 | HR 103 | Temp 98.2°F | Resp 18 | Ht 68.0 in | Wt 147.0 lb

## 2017-11-27 DIAGNOSIS — Z9889 Other specified postprocedural states: Secondary | ICD-10-CM | POA: Diagnosis not present

## 2017-11-27 DIAGNOSIS — I824Z1 Acute embolism and thrombosis of unspecified deep veins of right distal lower extremity: Secondary | ICD-10-CM

## 2017-11-27 DIAGNOSIS — I6523 Occlusion and stenosis of bilateral carotid arteries: Secondary | ICD-10-CM | POA: Diagnosis not present

## 2017-11-27 DIAGNOSIS — I6521 Occlusion and stenosis of right carotid artery: Secondary | ICD-10-CM
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
## 2017-11-27 NOTE — Patient Instructions (Signed)
Deep Vein Thrombosis Deep vein thrombosis (DVT) is a condition in which a blood clot forms in a deep vein, such as a lower leg, thigh, or arm vein. A clot is blood that has thickened into a gel or solid. This condition is dangerous. It can lead to serious and even life-threatening complications if the clot travels to the lungs and causes a blockage (pulmonary embolism). It can also damage veins in the leg. This can result in leg pain, swelling, discoloration, and sores (post-thrombotic syndrome). What are the causes? This condition may be caused by:  A slowdown of blood flow.  Damage to a vein.  A condition that makes blood clot more easily.  What increases the risk? The following factors may make you more likely to develop this condition:  Being overweight.  Being elderly, especially over age 60.  Sitting or lying down for more than four hours.  Lack of physical activity (sedentary lifestyle).  Being pregnant, giving birth, or having recently given birth.  Taking medicines that contain estrogen.  Smoking.  A history of any of the following: ? Blood clots or blood clotting disease. ? Peripheral vascular disease. ? Inflammatory bowel disease. ? Cancer. ? Heart disease. ? Genetic conditions that affect how blood clots. ? Neurological diseases that affect the legs (leg paresis). ? Injury. ? Major or lengthy surgery. ? A central line placed inside a large vein.  What are the signs or symptoms? Symptoms of this condition include:  Swelling, pain, or tenderness in an arm or leg.  Warmth, redness, or discoloration in an arm or leg.  If the clot is in your leg, symptoms may be more noticeable or worse when you stand or walk. Some people do not have any symptoms. How is this diagnosed? This condition is diagnosed with:  A medical history.  A physical exam.  Tests, such as: ? Blood tests. These are done to see how your blood clots. ? Imaging tests. These are done to  check for clots. Tests may include:  Ultrasound.  CT scan.  MRI.  X-ray.  Venogram. For this test, X-rays are taken after a dye is injected into a vein.  How is this treated? Treatment for this condition depends on the cause, your risk for bleeding or developing more clots, and any medical conditions you have. Treatment may include:  Taking blood thinners (also called anticoagulants). These medicines may be taken by mouth, injected under the skin, or injected through an IV tube (catheter). These medicines prevent clots from forming.  Injecting medicine that dissolves blood clots into the affected vein (catheter-directed thrombolysis).  Having surgery. Surgery may be done to: ? Remove the clot. ? Place a filter in a large vein to catch blood clots before they reach the lungs.  Some treatments may be continued for up to six months. Follow these instructions at home: If you are taking an oral blood thinner:  Take the medicine exactly as told by your health care provider. Some blood thinners need to be taken at the same time every day. Do not skip a dose.  Ask your health care provider about what foods and drugs interact with the medicine.  Ask about possible side effects. General instructions  Blood thinners can cause easy bruising and difficulty stopping bleeding. Because of this, if you are taking or were given a blood thinner: ? Hold pressure over cuts for longer than usual. ? Tell your dentist and other health care providers that you are taking blood thinners before   having any procedures that can cause bleeding. ? Avoid contact sports.  Take over-the-counter and prescription medicines only as told by your health care provider.  Return to your normal activities as told by your health care provider. Ask your health care provider what activities are safe for you.  Wear compression stockings if recommended by your health care provider.  Keep all follow-up visits as told by  your health care provider. This is important. How is this prevented? To lower your risk of developing this condition again:  For 30 or more minutes every day, do an activity that: ? Involves moving your arms and legs. ? Increases your heart rate.  When traveling for longer than four hours: ? Exercise your arms and legs every hour. ? Drink plenty of water. ? Avoid drinking alcohol.  Avoid sitting or lying for a long time without moving your legs.  Stay a healthy weight.  If you are a woman who is older than age 35, avoid unnecessary use of medicines that contain estrogen.  Do not use any products that contain nicotine or tobacco, such as cigarettes and e-cigarettes. This is especially important if you take estrogen medicines. If you need help quitting, ask your health care provider.  Contact a health care provider if:  You miss a dose of your blood thinner.  You have nausea, vomiting, or diarrhea that lasts for more than one day.  Your menstrual period is heavier than usual.  You have unusual bruising. Get help right away if:  You have new or increased pain, swelling, or redness in an arm or leg.  You have numbness or tingling in an arm or leg.  You have shortness of breath.  You have chest pain.  You have a rapid or irregular heartbeat.  You feel light-headed or dizzy.  You cough up blood.  There is blood in your vomit, stool, or urine.  You have a serious fall or accident, or you hit your head.  You have a severe headache or confusion.  You have a cut that will not stop bleeding. These symptoms may represent a serious problem that is an emergency. Do not wait to see if the symptoms will go away. Get medical help right away. Call your local emergency services (911 in the [place] Do not drive yourself to the hospital. Summary  DVT is a condition in which a blood clot forms in a deep vein, such as a lower leg, thigh, or arm vein.  Symptoms can include swelling,  warmth, pain, and redness in your leg or arm.  Treatment may include taking blood thinners, injecting medicine that dissolves blood clots,wearing compression stockings, or surgery.  If you are prescribed blood thinners, take them exactly as told. This information is not intended to replace advice given to you by your health care provider. Make sure you discuss any questions you have with your health care provider. Document Released: 01/23/2005 Document Revised: 02/26/2016 Document Reviewed: 02/26/2016 Elsevier Interactive Patient Education  2018 [employer]  

## 2017-12-06 DIAGNOSIS — N401 Enlarged prostate with lower urinary tract symptoms: Secondary | ICD-10-CM | POA: Diagnosis not present

## 2017-12-06 DIAGNOSIS — R3915 Urgency of urination: Secondary | ICD-10-CM | POA: Diagnosis not present

## 2017-12-06 DIAGNOSIS — R3912 Poor urinary stream: Secondary | ICD-10-CM | POA: Diagnosis not present

## 2018-01-08 DIAGNOSIS — N410 Acute prostatitis: Secondary | ICD-10-CM | POA: Diagnosis not present

## 2018-01-08 DIAGNOSIS — N401 Enlarged prostate with lower urinary tract symptoms: Secondary | ICD-10-CM | POA: Diagnosis not present

## 2018-01-10 DIAGNOSIS — D485 Neoplasm of uncertain behavior of skin: Secondary | ICD-10-CM | POA: Diagnosis not present

## 2018-01-10 DIAGNOSIS — D045 Carcinoma in situ of skin of trunk: Secondary | ICD-10-CM | POA: Diagnosis not present

## 2018-01-10 DIAGNOSIS — L821 Other seborrheic keratosis: Secondary | ICD-10-CM | POA: Diagnosis not present

## 2018-01-10 DIAGNOSIS — Z85820 Personal history of malignant melanoma of skin: Secondary | ICD-10-CM | POA: Diagnosis not present

## 2018-01-10 DIAGNOSIS — L570 Actinic keratosis: Secondary | ICD-10-CM | POA: Diagnosis not present

## 2018-01-10 DIAGNOSIS — Z85828 Personal history of other malignant neoplasm of skin: Secondary | ICD-10-CM | POA: Diagnosis not present

## 2018-01-10 DIAGNOSIS — C44319 Basal cell carcinoma of skin of other parts of face: Secondary | ICD-10-CM | POA: Diagnosis not present

## 2018-01-10 DIAGNOSIS — D1801 Hemangioma of skin and subcutaneous tissue: Secondary | ICD-10-CM | POA: Diagnosis not present

## 2018-01-14 DIAGNOSIS — Z0000 Encounter for general adult medical examination without abnormal findings: Secondary | ICD-10-CM | POA: Diagnosis not present

## 2018-01-14 DIAGNOSIS — H9193 Unspecified hearing loss, bilateral: Secondary | ICD-10-CM | POA: Diagnosis not present

## 2018-01-14 DIAGNOSIS — I700 Atherosclerosis of aorta: Secondary | ICD-10-CM | POA: Diagnosis not present

## 2018-01-14 DIAGNOSIS — Z1389 Encounter for screening for other disorder: Secondary | ICD-10-CM | POA: Diagnosis not present

## 2018-01-14 DIAGNOSIS — I82401 Acute embolism and thrombosis of unspecified deep veins of right lower extremity: Secondary | ICD-10-CM | POA: Diagnosis not present

## 2018-01-14 DIAGNOSIS — N400 Enlarged prostate without lower urinary tract symptoms: Secondary | ICD-10-CM | POA: Diagnosis not present

## 2018-01-14 DIAGNOSIS — E782 Mixed hyperlipidemia: Secondary | ICD-10-CM | POA: Diagnosis not present

## 2018-01-14 DIAGNOSIS — I739 Peripheral vascular disease, unspecified: Secondary | ICD-10-CM | POA: Diagnosis not present

## 2018-01-14 DIAGNOSIS — R0789 Other chest pain: Secondary | ICD-10-CM | POA: Diagnosis not present

## 2018-01-14 DIAGNOSIS — G4700 Insomnia, unspecified: Secondary | ICD-10-CM | POA: Diagnosis not present

## 2018-01-14 DIAGNOSIS — J432 Centrilobular emphysema: Secondary | ICD-10-CM | POA: Diagnosis not present

## 2018-01-14 DIAGNOSIS — Z87898 Personal history of other specified conditions: Secondary | ICD-10-CM | POA: Diagnosis not present

## 2018-01-14 DIAGNOSIS — R000 Tachycardia, unspecified: Secondary | ICD-10-CM | POA: Diagnosis not present

## 2018-03-09 DIAGNOSIS — L03115 Cellulitis of right lower limb: Secondary | ICD-10-CM | POA: Diagnosis not present

## 2018-03-09 DIAGNOSIS — I739 Peripheral vascular disease, unspecified: Secondary | ICD-10-CM | POA: Diagnosis not present

## 2018-03-26 DIAGNOSIS — L03115 Cellulitis of right lower limb: Secondary | ICD-10-CM | POA: Diagnosis not present

## 2018-03-26 DIAGNOSIS — L989 Disorder of the skin and subcutaneous tissue, unspecified: Secondary | ICD-10-CM | POA: Diagnosis not present

## 2018-03-28 DIAGNOSIS — Z85828 Personal history of other malignant neoplasm of skin: Secondary | ICD-10-CM | POA: Diagnosis not present

## 2018-03-28 DIAGNOSIS — Z85820 Personal history of malignant melanoma of skin: Secondary | ICD-10-CM | POA: Diagnosis not present

## 2018-03-28 DIAGNOSIS — C44722 Squamous cell carcinoma of skin of right lower limb, including hip: Secondary | ICD-10-CM | POA: Diagnosis not present

## 2018-07-16 DIAGNOSIS — C4442 Squamous cell carcinoma of skin of scalp and neck: Secondary | ICD-10-CM | POA: Diagnosis not present

## 2018-07-16 DIAGNOSIS — D485 Neoplasm of uncertain behavior of skin: Secondary | ICD-10-CM | POA: Diagnosis not present

## 2018-07-16 DIAGNOSIS — Z85828 Personal history of other malignant neoplasm of skin: Secondary | ICD-10-CM | POA: Diagnosis not present

## 2018-07-16 DIAGNOSIS — L821 Other seborrheic keratosis: Secondary | ICD-10-CM | POA: Diagnosis not present

## 2018-07-16 DIAGNOSIS — C44722 Squamous cell carcinoma of skin of right lower limb, including hip: Secondary | ICD-10-CM | POA: Diagnosis not present

## 2018-07-16 DIAGNOSIS — Z85820 Personal history of malignant melanoma of skin: Secondary | ICD-10-CM | POA: Diagnosis not present

## 2018-07-16 DIAGNOSIS — C44622 Squamous cell carcinoma of skin of right upper limb, including shoulder: Secondary | ICD-10-CM | POA: Diagnosis not present

## 2018-07-18 ENCOUNTER — Other Ambulatory Visit (HOSPITAL_BASED_OUTPATIENT_CLINIC_OR_DEPARTMENT_OTHER): Payer: Self-pay | Admitting: Family Medicine

## 2018-07-18 ENCOUNTER — Ambulatory Visit (HOSPITAL_BASED_OUTPATIENT_CLINIC_OR_DEPARTMENT_OTHER)
Admission: RE | Admit: 2018-07-18 | Discharge: 2018-07-18 | Disposition: A | Discharge status: Home / Self Care | Payer: Medicare Other | Source: Ambulatory Visit | Attending: Family Medicine | Admitting: Family Medicine

## 2018-07-18 ENCOUNTER — Other Ambulatory Visit: Payer: Self-pay

## 2018-07-18 DIAGNOSIS — E782 Mixed hyperlipidemia: Secondary | ICD-10-CM | POA: Diagnosis not present

## 2018-07-18 DIAGNOSIS — K5900 Constipation, unspecified: Secondary | ICD-10-CM | POA: Diagnosis not present

## 2018-07-18 DIAGNOSIS — G4700 Insomnia, unspecified: Secondary | ICD-10-CM | POA: Diagnosis not present

## 2018-07-18 DIAGNOSIS — H9193 Unspecified hearing loss, bilateral: Secondary | ICD-10-CM | POA: Diagnosis not present

## 2018-07-18 DIAGNOSIS — I82401 Acute embolism and thrombosis of unspecified deep veins of right lower extremity: Secondary | ICD-10-CM | POA: Diagnosis not present

## 2018-07-18 DIAGNOSIS — M7989 Other specified soft tissue disorders: Secondary | ICD-10-CM

## 2018-07-18 DIAGNOSIS — Z87898 Personal history of other specified conditions: Secondary | ICD-10-CM | POA: Diagnosis not present

## 2018-07-18 DIAGNOSIS — I824Z1 Acute embolism and thrombosis of unspecified deep veins of right distal lower extremity: Secondary | ICD-10-CM | POA: Diagnosis not present

## 2018-07-18 DIAGNOSIS — I700 Atherosclerosis of aorta: Secondary | ICD-10-CM | POA: Diagnosis not present

## 2018-07-18 DIAGNOSIS — I82431 Acute embolism and thrombosis of right popliteal vein: Secondary | ICD-10-CM | POA: Diagnosis not present

## 2018-07-18 DIAGNOSIS — I739 Peripheral vascular disease, unspecified: Secondary | ICD-10-CM | POA: Diagnosis not present

## 2018-07-18 DIAGNOSIS — N400 Enlarged prostate without lower urinary tract symptoms: Secondary | ICD-10-CM | POA: Diagnosis not present

## 2018-07-18 DIAGNOSIS — I82411 Acute embolism and thrombosis of right femoral vein: Secondary | ICD-10-CM | POA: Diagnosis not present

## 2018-07-18 DIAGNOSIS — J432 Centrilobular emphysema: Secondary | ICD-10-CM | POA: Diagnosis not present

## 2018-07-24 ENCOUNTER — Ambulatory Visit (INDEPENDENT_AMBULATORY_CARE_PROVIDER_SITE_OTHER): Payer: Medicare Other | Admitting: Family

## 2018-07-24 ENCOUNTER — Other Ambulatory Visit: Payer: Self-pay

## 2018-07-24 ENCOUNTER — Encounter: Payer: Self-pay | Admitting: Family

## 2018-07-24 VITALS — BP 141/91 | HR 80 | Temp 97.1°F | Resp 16 | Ht 68.0 in | Wt 150.0 lb

## 2018-07-24 DIAGNOSIS — I6521 Occlusion and stenosis of right carotid artery: Secondary | ICD-10-CM

## 2018-07-24 DIAGNOSIS — Z9889 Other specified postprocedural states: Secondary | ICD-10-CM | POA: Diagnosis not present

## 2018-07-24 DIAGNOSIS — I8391 Asymptomatic varicose veins of right lower extremity: Secondary | ICD-10-CM

## 2018-07-24 DIAGNOSIS — I82501 Chronic embolism and thrombosis of unspecified deep veins of right lower extremity: Secondary | ICD-10-CM
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
## 2018-07-29 DIAGNOSIS — Z85820 Personal history of malignant melanoma of skin: Secondary | ICD-10-CM | POA: Diagnosis not present

## 2018-07-29 DIAGNOSIS — C4442 Squamous cell carcinoma of skin of scalp and neck: Secondary | ICD-10-CM | POA: Diagnosis not present

## 2018-07-29 DIAGNOSIS — Z85828 Personal history of other malignant neoplasm of skin: Secondary | ICD-10-CM | POA: Diagnosis not present

## 2018-10-16 DIAGNOSIS — L821 Other seborrheic keratosis: Secondary | ICD-10-CM | POA: Diagnosis not present

## 2018-10-16 DIAGNOSIS — C44319 Basal cell carcinoma of skin of other parts of face: Secondary | ICD-10-CM | POA: Diagnosis not present

## 2018-10-16 DIAGNOSIS — D485 Neoplasm of uncertain behavior of skin: Secondary | ICD-10-CM | POA: Diagnosis not present

## 2018-10-16 DIAGNOSIS — Z85820 Personal history of malignant melanoma of skin: Secondary | ICD-10-CM | POA: Diagnosis not present

## 2018-10-16 DIAGNOSIS — Z85828 Personal history of other malignant neoplasm of skin: Secondary | ICD-10-CM | POA: Diagnosis not present

## 2018-10-16 DIAGNOSIS — L570 Actinic keratosis: Secondary | ICD-10-CM | POA: Diagnosis not present

## 2018-10-16 DIAGNOSIS — D692 Other nonthrombocytopenic purpura: Secondary | ICD-10-CM | POA: Diagnosis not present

## 2018-10-16 DIAGNOSIS — D045 Carcinoma in situ of skin of trunk: Secondary | ICD-10-CM | POA: Diagnosis not present

## 2018-10-16 DIAGNOSIS — C44329 Squamous cell carcinoma of skin of other parts of face: Secondary | ICD-10-CM | POA: Diagnosis not present

## 2018-10-16 DIAGNOSIS — D1801 Hemangioma of skin and subcutaneous tissue: Secondary | ICD-10-CM | POA: Diagnosis not present

## 2018-11-11 ENCOUNTER — Other Ambulatory Visit: Payer: Self-pay

## 2018-11-11 DIAGNOSIS — I6523 Occlusion and stenosis of bilateral carotid arteries: Secondary | ICD-10-CM

## 2018-11-18 ENCOUNTER — Ambulatory Visit: Payer: Medicare Other | Admitting: Family

## 2018-11-18 ENCOUNTER — Encounter (HOSPITAL_COMMUNITY): Payer: Medicare Other

## 2018-11-26 ENCOUNTER — Other Ambulatory Visit: Payer: Self-pay

## 2018-11-26 ENCOUNTER — Encounter (HOSPITAL_BASED_OUTPATIENT_CLINIC_OR_DEPARTMENT_OTHER): Payer: Self-pay | Admitting: *Deleted

## 2018-11-26 ENCOUNTER — Emergency Department (HOSPITAL_BASED_OUTPATIENT_CLINIC_OR_DEPARTMENT_OTHER)
Admission: EM | Admit: 2018-11-26 | Discharge: 2018-11-26 | Disposition: A | Discharge status: Home / Self Care | Payer: Medicare Other | Attending: Emergency Medicine | Admitting: Emergency Medicine

## 2018-11-26 ENCOUNTER — Emergency Department (HOSPITAL_BASED_OUTPATIENT_CLINIC_OR_DEPARTMENT_OTHER): Payer: Medicare Other

## 2018-11-26 DIAGNOSIS — S42211A Unspecified displaced fracture of surgical neck of right humerus, initial encounter for closed fracture: Secondary | ICD-10-CM | POA: Diagnosis not present

## 2018-11-26 DIAGNOSIS — Z79899 Other long term (current) drug therapy: Secondary | ICD-10-CM | POA: Insufficient documentation

## 2018-11-26 DIAGNOSIS — Y9389 Activity, other specified: Secondary | ICD-10-CM | POA: Diagnosis not present

## 2018-11-26 DIAGNOSIS — Z7982 Long term (current) use of aspirin: Secondary | ICD-10-CM | POA: Diagnosis not present

## 2018-11-26 DIAGNOSIS — Y999 Unspecified external cause status: Secondary | ICD-10-CM | POA: Insufficient documentation

## 2018-11-26 DIAGNOSIS — Z85828 Personal history of other malignant neoplasm of skin: Secondary | ICD-10-CM | POA: Diagnosis not present

## 2018-11-26 DIAGNOSIS — Y92008 Other place in unspecified non-institutional (private) residence as the place of occurrence of the external cause: Secondary | ICD-10-CM | POA: Diagnosis not present

## 2018-11-26 DIAGNOSIS — I10 Essential (primary) hypertension: Secondary | ICD-10-CM | POA: Diagnosis not present

## 2018-11-26 DIAGNOSIS — I2510 Atherosclerotic heart disease of native coronary artery without angina pectoris: Secondary | ICD-10-CM | POA: Insufficient documentation

## 2018-11-26 DIAGNOSIS — S4992XA Unspecified injury of left shoulder and upper arm, initial encounter: Secondary | ICD-10-CM | POA: Diagnosis present

## 2018-11-26 DIAGNOSIS — S4991XA Unspecified injury of right shoulder and upper arm, initial encounter: Secondary | ICD-10-CM | POA: Diagnosis not present

## 2018-11-26 DIAGNOSIS — S42221A 2-part displaced fracture of surgical neck of right humerus, initial encounter for closed fracture: Secondary | ICD-10-CM | POA: Insufficient documentation

## 2018-11-26 DIAGNOSIS — W1839XA Other fall on same level, initial encounter: Secondary | ICD-10-CM | POA: Diagnosis not present

## 2018-11-26 DIAGNOSIS — M25511 Pain in right shoulder: Secondary | ICD-10-CM | POA: Diagnosis not present

## 2018-11-26 MED ORDER — ACETAMINOPHEN 325 MG PO TABS
650.0000 mg | ORAL_TABLET | Freq: Once | ORAL | Status: AC
  Administered 2018-11-26: 650 mg via ORAL
  Filled 2018-11-26: qty 2

## 2018-11-26 NOTE — ED Notes (Signed)
Patient denies pain as long as he does not move his right arm.

## 2018-11-26 NOTE — ED Triage Notes (Signed)
Lost his balance and fell this am. Injury to his right shoulder.

## 2018-11-26 NOTE — ED Provider Notes (Signed)
[facility] EMERGENCY DEPARTMENT Provider Note   CSN: [id number] Arrival date & time: 11/26/18  1326     History   Chief Complaint Chief Complaint  Patient presents with  . Fall  . Shoulder Injury    HPI William Mann is a 85 y.o. male with a past medical history of DVT, CAD, who presents to ED for right shoulder pain.  States that he was outside of his house moving chairs when one of the chair started spinning and he lost his balance.  He fell onto his right shoulder.  He denies head injury or loss of consciousness.  He has had pain of his right upper arm with overhead reaching.  He is not taking any medications at home to help with pain. Denies headache, vision changes, vomiting, prior fracture, dislocation or procedure in the area, changes to gait, numbness in arms or legs, chest pain or prodromal symptoms. He is currently anticoagulated on Eliquis.    HPI  Past Medical History:  Diagnosis Date  . Aortic atherosclerosis ([place])   . Bilateral hearing loss   . BPH (benign prostatic hyperplasia)   . Cancer ([place])    basal cell carcinoma  . Carotid artery occlusion   . Constipation   . Coronary artery disease   . DVT (deep venous thrombosis) ([place])   . Emphysema lung ([place])   . Fatty liver   . GERD (gastroesophageal reflux disease)   . History of echocardiogram    Echo 4/17: Vigorous LVEF, EF 65-70%, normal wall motion, grade 1 diastolic dysfunction, mildly dilated ascending aorta (40 mm)  . Hyperlipidemia   . Hypertension   . Insomnia   . PVD (peripheral vascular disease) ([place])   . Thyroid nodule     Patient Active Problem List   Diagnosis Date Noted  . Aftercare following surgery of the circulatory system 02/17/2014  . Bilateral carotid artery occlusion 02/17/2014  . Aftercare following surgery of the circulatory system, [place] 01/17/2013  . Numbness and tingling 02/27/2012  . Peripheral vascular disease, unspecified ([place]) 02/27/2012  . Occlusion and stenosis  of carotid artery without mention of cerebral infarction 03/14/2011    Past Surgical History:  Procedure Laterality Date  . APPENDECTOMY    . CAROTID ENDARTERECTOMY  03-05-09   right CEA  . COLONOSCOPY  Oct. 2013   one polyp  . HERNIA REPAIR    . LIPOMA EXCISION          Home Medications    Prior to Admission medications   Medication Sig Start Date End Date Taking? Authorizing Provider  aspirin EC 81 MG tablet Take 81 mg by mouth daily.     [provider]  Calcium Carbonate (CALTRATE 600 PO) Take by mouth daily.    [provider]  Cholecalciferol (VITAMIN William-3 PO) Take 1 tablet by mouth daily.    [provider]  Coenzyme Q10 (COQ-10) 100 MG CAPS Take by mouth daily.    [provider]  cyanocobalamin 100 MCG tablet Take 100 mcg by mouth daily.    [provider]  Multiple Vitamin (MULTIVITAMIN) tablet Take 1 tablet by mouth daily.    [provider]  Rivaroxaban 15 & 20 MG TBPK Take as directed on package: Start with one 15mg  tablet by mouth twice a day with food. On Day 22, switch to one 20mg  tablet once a day with food. 11/07/17   Gareth Morgan, MD  simvastatin (ZOCOR) 10 MG tablet Take 10 mg by mouth daily.  [provider]  traZODone (DESYREL) 50 MG tablet Take 50 mg by mouth at bedtime.    [provider]  zolpidem (AMBIEN) 5 MG tablet Take 5 mg by mouth at bedtime as needed for sleep.     [provider]    Family History Family History  Problem Relation Age of Onset  . Heart disease Sister   . Cancer Sister   . Hypertension Sister   . Heart attack Sister   . Heart disease Brother   . Stroke Brother   . Hypertension Brother   . Heart disease Brother   . Hyperlipidemia Son     Social History Social History   Tobacco Use  . Smoking status: Never Smoker  . Smokeless tobacco: Never Used  Substance Use Topics  . Alcohol use: No  . Drug use: No     Allergies    Atorvastatin, Other, Rosuvastatin, and Viagra [sildenafil]   Review of Systems Review of Systems  Constitutional: Negative for appetite change, chills and fever.  HENT: Negative for ear pain, rhinorrhea, sneezing and sore throat.   Eyes: Negative for photophobia and visual disturbance.  Respiratory: Negative for cough, chest tightness, shortness of breath and wheezing.   Cardiovascular: Negative for chest pain and palpitations.  Gastrointestinal: Negative for abdominal pain, blood in stool, constipation, diarrhea, nausea and vomiting.  Genitourinary: Negative for dysuria, hematuria and urgency.  Musculoskeletal: Positive for arthralgias. Negative for myalgias.  Skin: Negative for rash.  Neurological: Negative for dizziness, weakness and light-headedness.     Physical Exam Updated Vital Signs BP (!) 164/62 (BP Location: Left Arm)   Pulse 83   Temp 97.9 F (36.6 C) (Oral)   Resp 16   Ht 5\' 8"  (1.727 m)   Wt 67.1 kg   SpO2 97%   BMI 22.50 kg/m   Physical Exam Vitals signs and nursing note reviewed.  Constitutional:      General: He is not in acute distress.    Appearance: He is well-developed.  HENT:     Head: Normocephalic and atraumatic.     Nose: Nose normal.  Eyes:     General: No scleral icterus.       Right eye: No discharge.        Left eye: No discharge.     Conjunctiva/sclera: Conjunctivae normal.  Neck:     Musculoskeletal: Normal range of motion and neck supple.  Cardiovascular:     Rate and Rhythm: Normal rate and regular rhythm.     Heart sounds: Normal heart sounds. No murmur. No friction rub. No gallop.   Pulmonary:     Effort: Pulmonary effort is normal. No respiratory distress.     Breath sounds: Normal breath sounds.  Abdominal:     General: Bowel sounds are normal. There is no distension.     Palpations: Abdomen is soft.     Tenderness: There is no abdominal tenderness. There is no guarding.  Musculoskeletal: Normal range of motion.        Arms:     Comments: TTP of the R shoulder area without deformity noted. No clavicular TTP. Unable to abduct extremity secondary to pain. Sensation intact to light touch of bilateral upper extremities. 2+ radial pulse palpated. No wounds noted. No rib TTP.  Skin:    General: Skin is warm and dry.     Findings: No rash.  Neurological:     General: No focal deficit present.     Mental Status: He is alert and  oriented to person, place, and time.     Cranial Nerves: No cranial nerve deficit.     Sensory: No sensory deficit.     Motor: No weakness or abnormal muscle tone.     Coordination: Coordination normal.      ED Treatments / Results  Labs (all labs ordered are listed, but only abnormal results are displayed) Labs Reviewed - No data to display  EKG None  Radiology Dg Shoulder Right  Result Date: 11/26/2018 CLINICAL DATA:  Right shoulder pain secondary to a fall today. EXAM: RIGHT SHOULDER - 2+ VIEW COMPARISON:  None. FINDINGS: There appears to be a minimally displaced fracture of the right humeral neck. No dislocation. No abnormal soft tissue calcifications. IMPRESSION: Probable minimally displaced fracture of the right humeral neck. CT scan could better define the abnormality. Electronically Signed   By: Lorriane Shire M.William.   On: 11/26/2018 14:45   Ct Shoulder Right Wo Contrast  Result Date: 11/26/2018 CLINICAL DATA:  Fall today with right shoulder injury. EXAM: CT OF THE UPPER RIGHT EXTREMITY WITHOUT CONTRAST TECHNIQUE: Multidetector CT imaging of the right shoulder was performed according to the standard protocol. COMPARISON:  Radiographs 11/26/2018. FINDINGS: Bones/Joint/Cartilage There is a mildly displaced fracture involving the anatomic and surgical necks of the right humerus. There is no involvement of the humeral head articular surface or the tuberosities. The humeral head is located. There is no glenoid or other scapular fracture. There is no significant shoulder joint  effusion or underlying glenohumeral arthropathy. Minimal air in the joint may be due to vacuum phenomenon or intervention. Mild acromioclavicular degenerative changes are present. Ligaments Suboptimally assessed by CT. Muscles and Tendons No focal muscular atrophy. No gross impingement on the rotator cuff. Soft tissues No focal soft tissue hematoma. IMPRESSION: 1. Mildly displaced fracture of the anatomic and surgical necks of the right humerus. No involvement of the humeral head articular surface or the tuberosities. 2. No evidence of glenoid or other scapular fracture. 3. Mild acromioclavicular degenerative changes. Electronically Signed   By: Richardean Sale M.William.   On: 11/26/2018 15:37    Procedures Procedures (including critical care time)  Medications Ordered in ED Medications  acetaminophen (TYLENOL) tablet 650 mg (has no administration in time range)     Initial Impression / Assessment and Plan / ED Course  I have reviewed the triage vital signs and the nursing notes.  Pertinent labs & imaging results that were available during my care of the patient were reviewed by me and considered in my medical decision making (see chart for details).        85yo M with a past medical history of DVT, CAD currently anticoagulated on Eliquis presents to ED for right shoulder pain after injury that occurred prior to arrival.  He was moving chairs when he lost his balance after one of the chairs spun around.  Landed right on his right shoulder.  Denies any head injury or loss of consciousness, headache, vision changes, numbness arms or legs.  Tenderness palpation of the right shoulder and pain with overhead reaching.  Areas neurovascularly intact.  No overlying skin changes noted.  X-ray and CT of the shoulder confirm fracture of neck of the right humerus. Will place in sling and have him f/u with ortho. Return for worsening symptoms.  Patient is hemodynamically stable, in NAD, and able to ambulate in  the ED. Evaluation does not show pathology that would require ongoing emergent intervention or inpatient treatment. I explained the diagnosis to  the patient. Pain has been managed and has no complaints prior to discharge. Patient is comfortable with above plan and is stable for discharge at this time. All questions were answered prior to disposition. Strict return precautions for returning to the ED were discussed. Encouraged follow up with PCP.   An After Visit Summary was printed and given to the patient.   Portions of this note were generated with [employer]. Dictation errors may occur despite best attempts at proofreading.   Final Clinical Impressions(s) / ED Diagnoses   Final diagnoses:  Closed 2-part displaced fracture of surgical neck of right humerus, initial encounter    ED Discharge Orders    None       Delia Heady, PA-C 11/26/18 1603    Maudie Flakes, MD 11/29/18 [phone]

## 2018-11-26 NOTE — ED Notes (Signed)
Patient transported to X-ray 

## 2018-11-26 NOTE — Discharge Instructions (Addendum)
Take Tylenol as needed for pain. Wear sling as directed. Follow-up with orthopedist listed below with orthopedist you have seen before. Return to the ED for additional injuries or falls, swelling of your joints, numbness in arms or legs.

## 2018-12-04 DIAGNOSIS — S42201A Unspecified fracture of upper end of right humerus, initial encounter for closed fracture: Secondary | ICD-10-CM | POA: Diagnosis not present

## 2018-12-04 DIAGNOSIS — M25511 Pain in right shoulder: Secondary | ICD-10-CM | POA: Diagnosis not present

## 2018-12-20 ENCOUNTER — Emergency Department (HOSPITAL_COMMUNITY): Payer: Medicare Other

## 2018-12-20 ENCOUNTER — Encounter (HOSPITAL_COMMUNITY): Payer: Self-pay

## 2018-12-20 ENCOUNTER — Other Ambulatory Visit: Payer: Self-pay

## 2018-12-20 ENCOUNTER — Inpatient Hospital Stay (HOSPITAL_COMMUNITY)
Admission: EM | Admit: 2018-12-20 | Discharge: 2018-12-27 | DRG: 244 | Disposition: A | Discharge status: Skilled Nursing Facility | Payer: Medicare Other | Attending: Cardiology | Admitting: Cardiology

## 2018-12-20 DIAGNOSIS — E782 Mixed hyperlipidemia: Secondary | ICD-10-CM | POA: Diagnosis not present

## 2018-12-20 DIAGNOSIS — G4700 Insomnia, unspecified: Secondary | ICD-10-CM | POA: Diagnosis present

## 2018-12-20 DIAGNOSIS — Z20828 Contact with and (suspected) exposure to other viral communicable diseases: Secondary | ICD-10-CM | POA: Diagnosis present

## 2018-12-20 DIAGNOSIS — R488 Other symbolic dysfunctions: Secondary | ICD-10-CM | POA: Diagnosis not present

## 2018-12-20 DIAGNOSIS — W19XXXD Unspecified fall, subsequent encounter: Secondary | ICD-10-CM | POA: Diagnosis present

## 2018-12-20 DIAGNOSIS — I499 Cardiac arrhythmia, unspecified: Secondary | ICD-10-CM | POA: Diagnosis not present

## 2018-12-20 DIAGNOSIS — Z823 Family history of stroke: Secondary | ICD-10-CM | POA: Diagnosis not present

## 2018-12-20 DIAGNOSIS — I442 Atrioventricular block, complete: Principal | ICD-10-CM | POA: Diagnosis present

## 2018-12-20 DIAGNOSIS — S42309D Unspecified fracture of shaft of humerus, unspecified arm, subsequent encounter for fracture with routine healing: Secondary | ICD-10-CM

## 2018-12-20 DIAGNOSIS — Z7901 Long term (current) use of anticoagulants: Secondary | ICD-10-CM

## 2018-12-20 DIAGNOSIS — N400 Enlarged prostate without lower urinary tract symptoms: Secondary | ICD-10-CM | POA: Diagnosis present

## 2018-12-20 DIAGNOSIS — Z950 Presence of cardiac pacemaker: Secondary | ICD-10-CM

## 2018-12-20 DIAGNOSIS — I2510 Atherosclerotic heart disease of native coronary artery without angina pectoris: Secondary | ICD-10-CM | POA: Diagnosis present

## 2018-12-20 DIAGNOSIS — I441 Atrioventricular block, second degree: Secondary | ICD-10-CM | POA: Diagnosis present

## 2018-12-20 DIAGNOSIS — S42201D Unspecified fracture of upper end of right humerus, subsequent encounter for fracture with routine healing: Secondary | ICD-10-CM | POA: Diagnosis not present

## 2018-12-20 DIAGNOSIS — K219 Gastro-esophageal reflux disease without esophagitis: Secondary | ICD-10-CM | POA: Diagnosis present

## 2018-12-20 DIAGNOSIS — Z48812 Encounter for surgical aftercare following surgery on the circulatory system: Secondary | ICD-10-CM | POA: Diagnosis not present

## 2018-12-20 DIAGNOSIS — Z23 Encounter for immunization: Secondary | ICD-10-CM

## 2018-12-20 DIAGNOSIS — E785 Hyperlipidemia, unspecified: Secondary | ICD-10-CM | POA: Diagnosis present

## 2018-12-20 DIAGNOSIS — R41841 Cognitive communication deficit: Secondary | ICD-10-CM | POA: Diagnosis not present

## 2018-12-20 DIAGNOSIS — I739 Peripheral vascular disease, unspecified: Secondary | ICD-10-CM | POA: Diagnosis present

## 2018-12-20 DIAGNOSIS — R0602 Shortness of breath: Secondary | ICD-10-CM | POA: Diagnosis not present

## 2018-12-20 DIAGNOSIS — I10 Essential (primary) hypertension: Secondary | ICD-10-CM | POA: Diagnosis present

## 2018-12-20 DIAGNOSIS — M40209 Unspecified kyphosis, site unspecified: Secondary | ICD-10-CM | POA: Diagnosis not present

## 2018-12-20 DIAGNOSIS — I4891 Unspecified atrial fibrillation: Secondary | ICD-10-CM | POA: Diagnosis not present

## 2018-12-20 DIAGNOSIS — R52 Pain, unspecified: Secondary | ICD-10-CM | POA: Diagnosis not present

## 2018-12-20 DIAGNOSIS — Z8249 Family history of ischemic heart disease and other diseases of the circulatory system: Secondary | ICD-10-CM | POA: Diagnosis not present

## 2018-12-20 DIAGNOSIS — Z7982 Long term (current) use of aspirin: Secondary | ICD-10-CM | POA: Diagnosis not present

## 2018-12-20 DIAGNOSIS — I4430 Unspecified atrioventricular block: Secondary | ICD-10-CM | POA: Diagnosis not present

## 2018-12-20 DIAGNOSIS — J9811 Atelectasis: Secondary | ICD-10-CM | POA: Diagnosis not present

## 2018-12-20 DIAGNOSIS — R0989 Other specified symptoms and signs involving the circulatory and respiratory systems: Secondary | ICD-10-CM | POA: Diagnosis not present

## 2018-12-20 LAB — BASIC METABOLIC PANEL
Anion gap: 11 (ref 5–15)
BUN: 22 mg/dL (ref 8–23)
CO2: 25 mmol/L (ref 22–32)
Calcium: 9 mg/dL (ref 8.9–10.3)
Chloride: 104 mmol/L (ref 98–111)
Creatinine, Ser: 1.03 mg/dL (ref 0.61–1.24)
GFR calc Af Amer: >60 mL/min (ref >60)
GFR calc non Af Amer: >60 mL/min (ref >60)
Glucose, Bld: 97 mg/dL (ref 70–99)
Potassium: 4.2 mmol/L (ref 3.5–5.1)
Sodium: 140 mmol/L (ref 135–145)

## 2018-12-20 LAB — CBC
HCT: 44.1 % (ref 39.0–52.0)
Hemoglobin: 14.8 g/dL (ref 13.0–17.0)
MCH: 34.4 pg — ABNORMAL HIGH (ref 26.0–34.0)
MCHC: 33.6 g/dL (ref 30.0–36.0)
MCV: 102.6 fL — ABNORMAL HIGH (ref 80.0–100.0)
Platelets: 164 10*3/uL (ref 150–400)
RBC: 4.3 MIL/uL (ref 4.22–5.81)
RDW: 13.2 % (ref 11.5–15.5)
WBC: 6.6 10*3/uL (ref 4.0–10.5)
nRBC: 0 % (ref 0.0–0.2)

## 2018-12-20 LAB — TSH: TSH: 2.417 u[IU]/mL (ref 0.350–4.500)

## 2018-12-20 LAB — TROPONIN I (HIGH SENSITIVITY)
Troponin I (High Sensitivity): 8 ng/L (ref <18)
Troponin I (High Sensitivity): 8 ng/L (ref <18)

## 2018-12-20 LAB — SARS CORONAVIRUS 2 BY RT PCR (HOSPITAL ORDER, PERFORMED IN ~~LOC~~ HOSPITAL LAB): SARS Coronavirus 2: NEGATIVE

## 2018-12-20 LAB — MAGNESIUM: Magnesium: 2.2 mg/dL (ref 1.7–2.4)

## 2018-12-20 MED ORDER — SODIUM CHLORIDE 0.9% FLUSH
3.0000 mL | Freq: Once | INTRAVENOUS | Status: AC
  Administered 2018-12-20: 3 mL via INTRAVENOUS

## 2018-12-20 MED ORDER — ONDANSETRON HCL 4 MG/2ML IJ SOLN: 4.0000 mg | Freq: Four times a day (QID) | INTRAMUSCULAR | Status: DC | PRN

## 2018-12-20 MED ORDER — NITROGLYCERIN 0.4 MG SL SUBL: 0.4000 mg | SUBLINGUAL_TABLET | SUBLINGUAL | Status: DC | PRN

## 2018-12-20 MED ORDER — SIMVASTATIN 20 MG PO TABS
10.0000 mg | ORAL_TABLET | Freq: Every day | ORAL | Status: DC
  Administered 2018-12-20 – 2018-12-27 (×8): 10 mg via ORAL
  Filled 2018-12-20 (×8): qty 1

## 2018-12-20 MED ORDER — ACETAMINOPHEN 325 MG PO TABS
650.0000 mg | ORAL_TABLET | ORAL | Status: DC | PRN
  Filled 2018-12-20: qty 2

## 2018-12-20 NOTE — ED Provider Notes (Signed)
[facility] EMERGENCY DEPARTMENT Provider Note   CSN: [id number] Arrival date & time: 12/20/18  1548     History   Chief Complaint Chief Complaint  Patient presents with  . Weakness  . Dizziness  . Palpitations    HPI [name] is a 85 y.o. male.     HPI   Presents with concern for palpitations, lightheadedness Feeling skipped beats, irregular heart beat, fast at times Indigestion didn't matter what time eating or when, indigestion in chest, does not currently have lightheadedness or indigestion 3 wk ago began to have the episodes of lightheadedness, occur spontaneously.   No dyspnea.  No syncope Taking eliquis  Past Medical History:  Diagnosis Date  . Aortic atherosclerosis ([place])   . Bilateral hearing loss   . BPH (benign prostatic hyperplasia)   . Cancer ([place])    basal cell carcinoma  . Carotid artery occlusion   . Constipation   . Coronary artery disease   . DVT (deep venous thrombosis) ([place])   . Emphysema lung ([place])   . Fatty liver   . GERD (gastroesophageal reflux disease)   . History of echocardiogram    Echo 4/17: Vigorous LVEF, EF 65-70%, normal wall motion, grade 1 diastolic dysfunction, mildly dilated ascending aorta (40 mm)  . Hyperlipidemia   . Hypertension   . Insomnia   . PVD (peripheral vascular disease) ([place])   . Thyroid nodule     Patient Active Problem List   Diagnosis Date Noted  . Complete heart block ([place]) 12/20/2018  . Aftercare following surgery of the circulatory system 02/17/2014  . Bilateral carotid artery occlusion 02/17/2014  . Aftercare following surgery of the circulatory system, [place] 01/17/2013  . Numbness and tingling 02/27/2012  . Peripheral vascular disease, unspecified ([place]) 02/27/2012  . Occlusion and stenosis of carotid artery without mention of cerebral infarction 03/14/2011    Past Surgical History:  Procedure Laterality Date  . APPENDECTOMY    . CAROTID ENDARTERECTOMY  03-05-09   right  CEA  . COLONOSCOPY  Oct. 2013   one polyp  . HERNIA REPAIR    . LIPOMA EXCISION          Home Medications    Prior to Admission medications   Medication Sig Start Date End Date Taking? Authorizing Provider  apixaban (ELIQUIS) 5 MG TABS tablet Take 5 mg by mouth 2 (two) times daily.   Yes [provider]  aspirin EC 81 MG tablet Take 81 mg by mouth daily.    Yes [provider]  Calcium Carbonate (CALTRATE 600 PO) Take 1 tablet by mouth daily.    Yes [provider]  Cholecalciferol (VITAMIN D-3 PO) Take 1 tablet by mouth daily.   Yes [provider]  Coenzyme Q10 (COQ-10) 100 MG CAPS Take 1 capsule by mouth daily.    Yes [provider]  cyanocobalamin 100 MCG tablet Take 100 mcg by mouth daily.   Yes [provider]  Multiple Vitamin (MULTIVITAMIN) tablet Take 1 tablet by mouth daily.   Yes [provider]  simvastatin (ZOCOR) 10 MG tablet Take 10 mg by mouth daily.   Yes [provider]  tamsulosin (FLOMAX) 0.4 MG CAPS capsule Take 0.4 mg by mouth daily.   Yes [provider]    Family History Family History  Problem Relation Age of Onset  . Heart disease Sister   . Cancer Sister   . Hypertension Sister   . Heart attack Sister   .  Heart disease Brother   . Stroke Brother   . Hypertension Brother   . Heart disease Brother   . Hyperlipidemia Son     Social History Social History   Tobacco Use  . Smoking status: Never Smoker  . Smokeless tobacco: Never Used  Substance Use Topics  . Alcohol use: No  . Drug use: No     Allergies   Atorvastatin, Other, Rosuvastatin, and Viagra [sildenafil]   Review of Systems Review of Systems  Constitutional: Positive for fatigue. Negative for fever.  Respiratory: Negative for cough and shortness of breath.   Cardiovascular: Positive for chest pain ('indigestion") and palpitations. Negative for leg swelling.  Gastrointestinal: Negative for  abdominal pain, nausea and vomiting.  Musculoskeletal: Positive for arthralgias (right humerus fx, improving).  Skin: Negative for rash.  Neurological: Positive for light-headedness. Negative for weakness, numbness and headaches.     Physical Exam Updated Vital Signs BP (!) 179/57 (BP Location: Left Arm)   Pulse (!) 53   Temp 98.4 F (36.9 C) (Oral)   Resp 16   Ht 5\' 8"  (1.727 m)   Wt 68.5 kg   SpO2 96%   BMI 22.96 kg/m   Physical Exam Vitals signs and nursing note reviewed.  Constitutional:      General: He is not in acute distress.    Appearance: He is well-developed. He is not diaphoretic.  HENT:     Head: Normocephalic and atraumatic.  Eyes:     Conjunctiva/sclera: Conjunctivae normal.  Neck:     Musculoskeletal: Normal range of motion.  Cardiovascular:     Rate and Rhythm: Normal rate and regular rhythm.     Heart sounds: Normal heart sounds. No murmur. No friction rub. No gallop.   Pulmonary:     Effort: Pulmonary effort is normal. No respiratory distress.     Breath sounds: Normal breath sounds. No wheezing or rales.  Abdominal:     General: There is no distension.     Palpations: Abdomen is soft.     Tenderness: There is no abdominal tenderness. There is no guarding.  Skin:    General: Skin is warm and dry.  Neurological:     Mental Status: He is alert and oriented to person, place, and time.      ED Treatments / Results  Labs (all labs ordered are listed, but only abnormal results are displayed) Labs Reviewed  CBC - Abnormal; Notable for the following components:      Result Value   MCV 102.6 (*)    MCH 34.4 (*)    All other components within normal limits  SARS CORONAVIRUS 2 BY RT PCR (HOSPITAL ORDER, [facility])  BASIC METABOLIC PANEL  TSH  MAGNESIUM  TROPONIN I (HIGH SENSITIVITY)  TROPONIN I (HIGH SENSITIVITY)    EKG EKG Interpretation  Date/Time:  Friday December 20 2018 16:39:59 EST Ventricular Rate:  47  PR Interval:    QRS Duration: 82 QT Interval:  474 QTC Calculation: 397 R Axis:   12 Text Interpretation: Rhythm begins as Wenkebach then changes, concerning for third degree complete heart block Atrial premature complexes Confirmed by Gareth Morgan [phone]) on 12/20/2018 5:24:06 PM   Radiology Dg Chest Portable 1 View  Result Date: 12/20/2018 CLINICAL DATA:  Weakness, shortness of breath, coronary artery disease, emphysema, GERD, hypertension EXAM: PORTABLE CHEST 1 VIEW COMPARISON:  Portable exam 1651 hours compared 09/30/2016 FINDINGS: Enlargement of cardiac silhouette. Mediastinal contours and pulmonary vascularity normal. Atherosclerotic calcification aorta.  Minimal RIGHT basilar atelectasis. Lungs otherwise clear. No infiltrate, pleural effusion, or pneumothorax. Bones demineralized. IMPRESSION: Enlargement of cardiac silhouette with minimal RIGHT basilar atelectasis. Aortic atherosclerosis. Electronically Signed   By: Lavonia Dana M.D.   On: 12/20/2018 17:15    Procedures .Critical Care Performed by: Gareth Morgan, MD Authorized by: Gareth Morgan, MD   Critical care provider statement:    Critical care time (minutes):  45   Critical care was time spent personally by me on the following activities:  Discussions with consultants, examination of patient, ordering and performing treatments and interventions, ordering and review of laboratory studies, ordering and review of radiographic studies, pulse oximetry, re-evaluation of patient's condition, obtaining history from patient or surrogate and review of old charts   (including critical care time)  Medications Ordered in ED Medications  simvastatin (ZOCOR) tablet 10 mg (10 mg Oral Given 12/21/18 1150)  nitroGLYCERIN (NITROSTAT) SL tablet 0.4 mg (has no administration in time range)  acetaminophen (TYLENOL) tablet 650 mg (has no administration in time range)  ondansetron (ZOFRAN) injection 4 mg (has no administration in time  range)  sodium chloride flush (NS) 0.9 % injection 3 mL (3 mLs Intravenous Given 12/20/18 1637)     Initial Impression / Assessment and Plan / ED Course  I have reviewed the triage vital signs and the nursing notes.  Pertinent labs & imaging results that were available during my care of the patient were reviewed by me and considered in my medical decision making (see chart for details).        DE "William Camara" Mann is a pleasant 85yo male with history of DVT on eliquis, htn, hlpd, PVD, CAD, recent fall weeks ago seen in ED, presents with concern for episodes of lightheadedness, palpitations, fatigue and sensation of indigestion.  Initial ECG consistent with Second Degree, Mobitz type 1, Wenckebach, however repeat ECG begins with this appearance and then transfers to complete heart block. Placed pacer pads. Pt without hypotension, no AMS, asymptomatic with HR in 40s-50s and not requiring TC pacing in the ED. Consulted Cardiology who came to bedside for evaluation and plan on placing temporary pacer tonight.  Obtained permission for rapid COVID testing.  Labs do not show significant abnormalities.  Pt to have PM placed and admitted for further care.  Final Clinical Impressions(s) / ED Diagnoses   Final diagnoses:  Complete heart block (HCC)  Atrioventricular block, Mobitz type 1, Wenckebach    ED Discharge Orders    None       Gareth Morgan, MD 12/21/18 1214

## 2018-12-20 NOTE — ED Notes (Signed)
Cardiology at bedside.

## 2018-12-20 NOTE — ED Notes (Signed)
Heart healthy dinner tray ordered 

## 2018-12-20 NOTE — Progress Notes (Addendum)
   Placed admit order at request of Dr. Meda Coffee:  - Will admit to progressive unit.  - Keep Zoll pacing pads on. - Heart healthy diet ordered for now. Will make NPO at midnight. - Will check Echo tomorrow. - Will check TSH and Magnesium.  - Will hold home Eliquis. Last dose this morning. Will place SCDs for DVT prophylaxis (patient on Eliquis for DVTs but this was diagnosed several months ago so not acute). - Continue home Simvastatin 10mg  daily.  Please see Dr. Francesca Oman note for more details.  Darreld Mclean, PA-C 12/20/2018 6:49 PM

## 2018-12-20 NOTE — ED Triage Notes (Signed)
Pt from home via ems; called out for weakness, dizziness, and heart palpitations; found to be in 2nd degree heart block type 1 with ems; presents with sling to R arm; fall last month after dizzy spell, humeral fracture; dizzy spells x 1 year; pt says he has been told in the past that his "heart skips beats"; on Eliquis but pt is unsure why; dizzy 3 weeks ago; today pt reports dizziness, weakness, indigestion, palpitations, sob; all symptoms resolved at this time; hr 40 to 60; decreased vision L eye, hearing loss in L ear at baseline   168/100 HR 40-60 RR 20 96% RA CBG 100

## 2018-12-20 NOTE — H&P (Signed)
Cardiology Admission History and Physical:   Patient ID: William Mann MRN: [id number]; DOB: 03/07/1929   Admission date: 12/20/2018  Primary Care Provider: Antony Contras, MD Primary Cardiologist: Dr. Pernell Dupre, has not been seen since 2017  Chief Complaint: Dizziness, fatigue, fall irregular heartbeats.  Patient Profile:   William Mann is a 85 y.o. male with a history of coronary artery calcification noted on chest CT in 2016, PAD s/p right CEA in 02/2009, hypertension, hyperlipidemia, BPH, GERD, thyroid nodule, and insomnia who presents to the ED today with weakness, dizziness, and palpitations and EKG concerning for complete heart block.  History of Present Illness:   William Mann is a 85 year old male with a history of oronary artery calcification noted on chest CT in 2016, PAD s/p right CEA in 02/2009, hypertension, hyperlipidemia, BPH, GERD, thyroid nodule, and insomnia. Patient last seen in our office by Truitt Merle, NP, in 04/2015 for irregular heartbeat and palpitations. He reported intermittent flutter sensation about once per week worse at night and when his "stomach is acting up." No dizziness or syncope at that time. Echo and Event Monitor were ordered for further evaluation. Echo showed LVEF of 65-70% with grade 1 diastolic dysfunction and mildly dilated ascending aorta of 40 mm. Cardiac showed sinus rhythm with Mobitz 1 second degree heart block predominately during sleep but no excessive bradycardia or significant pauses. No atrial fibrillation. Patient advised to follow-up as needed. We have not seen him since that time.  Patient presented to the ED today via EMS for weakness, dizziness, and palpitations. Per triage note, patient has had dizzy spells for 6 months, he states that even when he turns in bed sometimes he feels like passing out and night, he has been experiencing dizzy spells and unusual fatigue, on October 20 is 2020 he walked out of his house and passed out and  broke his right humerus. Patient stated he was on Eliquis but is unsure why.  He was not on any beta-blocker or calcium channel blocker.  He has not been walking since his fracture.  Initial EKG showed sinus rhythm with possible Wenckebach. However, repeat EKG is consistent with complete heart block with rate of 44-54 bpm. BP stable. CBC and BMET unremarkable. High-sensitivity troponin negative.  He denies any fever, chills, cough lower extremity edema, orthopnea proximal nocturnal dyspnea.  At the time of this evaluation, he is currently asymptomatic and comfortable.  His heart rate is fluctuating from 44-54 and his blood pressure is 170.  Heart Pathway Score:     Past Medical History:  Diagnosis Date   Aortic atherosclerosis ([place])    Bilateral hearing loss    BPH (benign prostatic hyperplasia)    Cancer (HCC)    basal cell carcinoma   Carotid artery occlusion    Constipation    Coronary artery disease    DVT (deep venous thrombosis) (HCC)    Emphysema lung (HCC)    Fatty liver    GERD (gastroesophageal reflux disease)    History of echocardiogram    Echo 4/17: Vigorous LVEF, EF 65-70%, normal wall motion, grade 1 diastolic dysfunction, mildly dilated ascending aorta (40 mm)   Hyperlipidemia    Hypertension    Insomnia    PVD (peripheral vascular disease) (HCC)    Thyroid nodule     Past Surgical History:  Procedure Laterality Date   APPENDECTOMY     CAROTID ENDARTERECTOMY  03-05-09   right CEA   COLONOSCOPY  Oct. 2013   one  polyp   HERNIA REPAIR     LIPOMA EXCISION       Medications Prior to Admission: Prior to Admission medications   Medication Sig Start Date End Date Taking? Authorizing Provider  aspirin EC 81 MG tablet Take 81 mg by mouth daily.     [provider]  Calcium Carbonate (CALTRATE 600 PO) Take by mouth daily.    [provider]  Cholecalciferol (VITAMIN William-3 PO) Take 1 tablet by mouth daily.    [provider]  Coenzyme Q10 (COQ-10) 100 MG CAPS Take by mouth daily.    [provider]  cyanocobalamin 100 MCG tablet Take 100 mcg by mouth daily.    [provider]  Multiple Vitamin (MULTIVITAMIN) tablet Take 1 tablet by mouth daily.    [provider]  Rivaroxaban 15 & 20 MG TBPK Take as directed on package: Start with one 15mg  tablet by mouth twice a day with food. On Day 22, switch to one 20mg  tablet once a day with food. 11/07/17   Gareth Morgan, MD  simvastatin (ZOCOR) 10 MG tablet Take 10 mg by mouth daily.    [provider]  traZODone (DESYREL) 50 MG tablet Take 50 mg by mouth at bedtime.    [provider]  zolpidem (AMBIEN) 5 MG tablet Take 5 mg by mouth at bedtime as needed for sleep.     [provider]     Allergies:    Allergies  Allergen Reactions   Atorvastatin Other (See Comments)    Liver inflammation  Liver inflammation    Other    Rosuvastatin Other (See Comments)    Myalgia  Myalgia   Viagra [Sildenafil]     Social History:   Social History   Socioeconomic History   Marital status: Married    Spouse name: Not on file   Number of children: Not on file   Years of education: Not on file   Highest education level: Not on file  Occupational History   Not on file  Social Needs   Financial resource strain: Not on file   Food insecurity    Worry: Not on file    Inability: Not on file   Transportation needs    Medical: Not on file    Non-medical: Not on file  Tobacco Use   Smoking status: Never Smoker   Smokeless tobacco: Never Used  Substance and Sexual Activity   Alcohol use: No   Drug use: No   Sexual activity: Not Currently  Lifestyle   Physical activity    Days per week: Not on file    Minutes per session: Not on file   Stress: Not on file  Relationships   Social connections    Talks on phone: Not on file    Gets together: Not on file    Attends religious service: Not on  file    Active member of club or organization: Not on file    Attends meetings of clubs or organizations: Not on file    Relationship status: Not on file   Intimate partner violence    Fear of current or ex partner: Not on file    Emotionally abused: Not on file    Physically abused: Not on file    Forced sexual activity: Not on file  Other Topics Concern   Not on file  Social History Narrative   Not on file    Family History:  The patient's family history includes Cancer  in his sister; Heart attack in his sister; Heart disease in his brother, brother, and sister; Hyperlipidemia in his son; Hypertension in his brother and sister; Stroke in his brother.    ROS:  Please see the history of present illness.  All other ROS reviewed and negative.     Physical Exam/Data:   Vitals:   12/20/18 1605 12/20/18 1613 12/20/18 1615 12/20/18 1700  BP:   (!) 163/78   Pulse: (!) 125 (!) 48 (!) 42 77  Resp: 13 18 13  (!) 21  Temp:      TempSrc:      SpO2: 97% 96% 97% 98%  Weight:      Height:       No intake or output data in the 24 hours ending 12/20/18 1708 Last 3 Weights 12/20/2018 11/26/2018 07/24/2018  Weight (lbs) 145 lb 148 lb 150 lb  Weight (kg) 65.772 kg 67.132 kg 68.04 kg     Body mass index is 22.05 kg/m.  General:  Well nourished, well developed, in no acute distress HEENT: normal Lymph: no adenopathy Neck: no JVD Endocrine:  No thryomegaly Vascular: No carotid bruits; FA pulses 2+ bilaterally without bruits  Cardiac:  normal S1, S2; RRR; no murmur  Lungs:  clear to auscultation bilaterally, no wheezing, rhonchi or rales  Abd: soft, nontender, no hepatomegaly  Ext: no edema warm extremities, good peripheral pulses. Musculoskeletal:  No deformities, BUE and BLE strength normal and equal Skin: warm and dry  Neuro:  CNs 2-12 intact, no focal abnormalities noted Psych:  Normal affect   EKG:  The ECG that was done was personally reviewed and demonstrates complete A-V  dissociation with escape junctional rhythm with narrow QRS complexes.  Relevant CV Studies:  Cardiac Monitor 04/23/2015:  Basic rhythm is sinus rhythm  Mobitz 1 second-degree heart block is noted predominantly during sleep  No tachycardia or significant bradycardia   Mobitz 1 second-degree heart block without excessive bradycardia or significant pauses. Occurs predominantly during sleep. No atrial fibrillation or ventricular arrhythmias of significance. No excessive bradycardia. _______________  Echocardiogram 06/03/2015: Study Conclusions: - Left ventricle: The cavity size was normal. Systolic function was   vigorous. The estimated ejection fraction was in the range of 65%   to 70%. Wall motion was normal; there were no regional wall   motion abnormalities. Doppler parameters are consistent with   abnormal left ventricular relaxation (grade 1 diastolic   dysfunction). - Aorta: Ascending aortic diameter: 40 mm (S). - Ascending aorta: The ascending aorta was mildly dilated.   Laboratory Data:  High Sensitivity Troponin:  No results for input(s): TROPONINIHS in the last 720 hours.    ChemistryNo results for input(s): NA, K, CL, CO2, GLUCOSE, BUN, CREATININE, CALCIUM, GFRNONAA, GFRAA, ANIONGAP in the last 168 hours.  No results for input(s): PROT, ALBUMIN, AST, ALT, ALKPHOS, BILITOT in the last 168 hours. Hematology Recent Labs  Lab 12/20/18 1634  WBC 6.6  RBC 4.30  HGB 14.8  HCT 44.1  MCV 102.6*  MCH 34.4*  MCHC 33.6  RDW 13.2  PLT 164   BNPNo results for input(s): BNP, PROBNP in the last 168 hours.  DDimer No results for input(s): DDIMER in the last 168 hours.  Radiology/Studies:  No results found.    Assessment and Plan:   1. Complete heart block with complete A-V dissociation and narrow QRS complexes, patient is currently stable with ventricular rates 45-55 and hypertension, he currently does not require urgent temporary pacemaker placement, we will place  transcutaneous  pads just in case.  We will notify EP physician on call for possible permanent pacemaker placement tomorrow.  The patient was on Eliquis at home for DVT and he took last dose this morning.  We will hold and start IV heparin. 2. Hypertension -we will start losartan 25 mg daily. 3. DVT -on chronic therapy with Eliquis we will bridge with heparin. 4.   PAD s/p right CEA in 02/2009 5.   Hyperlipidemia -on simvastatin  Severity of Illness: The appropriate patient status for this patient is INPATIENT. Inpatient status is judged to be reasonable and necessary in order to provide the required intensity of service to ensure the patient's safety. The patient's presenting symptoms, physical exam findings, and initial radiographic and laboratory data in the context of their chronic comorbidities is felt to place them at high risk for further clinical deterioration. Furthermore, it is not anticipated that the patient will be medically stable for discharge from the hospital within 2 midnights of admission. The following factors support the patient status of inpatient.   " The patient's presenting symptoms include dizziness fatigue, syncope. " The worrisome physical exam findings include complete heart block. " The initial radiographic and laboratory data are worrisome because of complete heart block. " The chronic co-morbidities include hypertension, hyperlipidemia, peripheral arterial disease.  * I certify that at the point of admission it is my clinical judgment that the patient will require inpatient hospital care spanning beyond 2 midnights from the point of admission due to high intensity of service, high risk for further deterioration and high frequency of surveillance required.*  For questions or updates, please contact [place] Please consult [email or web address] for contact info under   Signed, Darreld Mclean, PA-C  12/20/2018 5:08 PM

## 2018-12-21 ENCOUNTER — Inpatient Hospital Stay (HOSPITAL_COMMUNITY): Payer: Medicare Other

## 2018-12-21 DIAGNOSIS — I442 Atrioventricular block, complete: Secondary | ICD-10-CM

## 2018-12-21 LAB — ECHOCARDIOGRAM COMPLETE
Height: 68 in
Weight: 2416 oz

## 2018-12-21 MED ORDER — PERFLUTREN LIPID MICROSPHERE
1.0000 mL | INTRAVENOUS | Status: AC | PRN
  Administered 2018-12-21: 5 mL via INTRAVENOUS
  Filled 2018-12-21: qty 10

## 2018-12-21 MED ORDER — INFLUENZA VAC A&B SA ADJ QUAD 0.5 ML IM PRSY
0.5000 mL | PREFILLED_SYRINGE | INTRAMUSCULAR | Status: AC
  Administered 2018-12-22: 0.5 mL via INTRAMUSCULAR
  Filled 2018-12-21: qty 0.5

## 2018-12-21 MED ORDER — TRAZODONE HCL 50 MG PO TABS
50.0000 mg | ORAL_TABLET | Freq: Once | ORAL | Status: AC
  Administered 2018-12-21: 50 mg via ORAL
  Filled 2018-12-21: qty 1

## 2018-12-21 NOTE — Plan of Care (Signed)
  Problem: Education: Goal: Knowledge of General Education information will improve Description: Including pain rating scale, medication(s)/side effects and non-pharmacologic comfort measures Outcome: Progressing   Problem: Coping: Goal: Level of anxiety will decrease Outcome: Progressing   Problem: Pain Managment: Goal: General experience of comfort will improve Outcome: Progressing   Problem: Safety: Goal: Ability to remain free from injury will improve Outcome: Progressing   Elesa Hacker, RN

## 2018-12-21 NOTE — Consult Note (Addendum)
Cardiology Consultation:   Patient ID: William Mann MRN: [id number]; DOB: Dec 15, 1929   Admit date: 12/20/2018 Date of Consult: 12/21/2018  Primary Care Provider: [name], MD Primary Cardiologist: [name] Electrophysiologist:  None new   Patient Profile:   William Mann is a 85 y.o. male with a hx of coronary calcifications noted on CT, PAD status post right CEA in 2011, hypertension, hyperlipidemia, BPH, GERD, thyroid nodules who is being seen today for the evaluation of complete heart block at the request of William Mann.  History of Present Illness:   [name] presented to the emergency room with weakness, dizziness, and palpitations.  He has been having dizzy spells for the last 6 months.  At times when he turns over in bed he feels like he is going to pass out.  On October 20, he walked out of his house, passed out, and broke his right humerus.  He was started on Eliquis but was unsure why.  Initial ECG showed sinus rhythm with Mobitz 1 AV block, but subsequent EKGs show evidence of complete heart block.  Heart Pathway Score:     Past Medical History:  Diagnosis Date  . Aortic atherosclerosis (HCC)   . Bilateral hearing loss   . BPH (benign prostatic hyperplasia)   . Cancer (HCC)    basal cell carcinoma  . Carotid artery occlusion   . Constipation   . Coronary artery disease   . DVT (deep venous thrombosis) (HCC)   . Emphysema lung (HCC)   . Fatty liver   . GERD (gastroesophageal reflux disease)   . History of echocardiogram    Echo 4/17: Vigorous LVEF, EF 65-70%, normal wall motion, grade 1 diastolic dysfunction, mildly dilated ascending aorta (40 mm)  . Hyperlipidemia   . Hypertension   . Insomnia   . PVD (peripheral vascular disease) (HCC)   . Thyroid nodule     Past Surgical History:  Procedure Laterality Date  . APPENDECTOMY    . CAROTID ENDARTERECTOMY  03-05-09   right CEA  . COLONOSCOPY  Oct. 2013   one polyp  . HERNIA REPAIR    .  LIPOMA EXCISION       Home Medications:  Prior to Admission medications   Medication Sig Start Date End Date Taking? Authorizing Provider  apixaban (ELIQUIS) 5 MG TABS tablet Take 5 mg by mouth 2 (two) times daily.   Yes [provider]  aspirin EC 81 MG tablet Take 81 mg by mouth daily.    Yes [provider]  Calcium Carbonate (CALTRATE 600 PO) Take 1 tablet by mouth daily.    Yes [provider]  Cholecalciferol (VITAMIN William-3 PO) Take 1 tablet by mouth daily.   Yes [provider]  Coenzyme Q10 (COQ-10) 100 MG CAPS Take 1 capsule by mouth daily.    Yes [provider]  cyanocobalamin 100 MCG tablet Take 100 mcg by mouth daily.   Yes [provider]  Multiple Vitamin (MULTIVITAMIN) tablet Take 1 tablet by mouth daily.   Yes [provider]  simvastatin (ZOCOR) 10 MG tablet Take 10 mg by mouth daily.   Yes [provider]  tamsulosin (FLOMAX) 0.4 MG CAPS capsule Take 0.4 mg by mouth daily.   Yes [provider]    Inpatient Medications: Scheduled Meds: . simvastatin  10 mg Oral Daily   Continuous Infusions:  PRN Meds: acetaminophen, nitroGLYCERIN, ondansetron (ZOFRAN) IV  Allergies:    Allergies  Allergen Reactions  . Atorvastatin  Other (See Comments)    Liver inflammation  Liver inflammation   . Other   . Rosuvastatin Other (See Comments)    Myalgia  Myalgia  . Viagra [Sildenafil]     Social History:   Social History   Socioeconomic History  . Marital status: Married    Spouse name: Not on file  . Number of children: Not on file  . Years of education: Not on file  . Highest education level: Not on file  Occupational History  . Not on file  Social Needs  . Financial resource strain: Not on file  . Food insecurity    Worry: Not on file    Inability: Not on file  . Transportation needs    Medical: Not on file    Non-medical: Not on file  Tobacco Use  . Smoking status: Never  Smoker  . Smokeless tobacco: Never Used  Substance and Sexual Activity  . Alcohol use: No  . Drug use: No  . Sexual activity: Not Currently  Lifestyle  . Physical activity    Days per week: Not on file    Minutes per session: Not on file  . Stress: Not on file  Relationships  . Social Musician on phone: Not on file    Gets together: Not on file    Attends religious service: Not on file    Active member of club or organization: Not on file    Attends meetings of clubs or organizations: Not on file    Relationship status: Not on file  . Intimate partner violence    Fear of current or ex partner: Not on file    Emotionally abused: Not on file    Physically abused: Not on file    Forced sexual activity: Not on file  Other Topics Concern  . Not on file  Social History Narrative  . Not on file    Family History:    Family History  Problem Relation Age of Onset  . Heart disease Sister   . Cancer Sister   . Hypertension Sister   . Heart attack Sister   . Heart disease Brother   . Stroke Brother   . Hypertension Brother   . Heart disease Brother   . Hyperlipidemia Son      ROS:  Please see the history of present illness.   All other ROS reviewed and negative.     Physical Exam/Data:   Vitals:   12/20/18 1845 12/20/18 1915 12/20/18 2015 12/21/18 0259  BP: (!) 155/82 (!) 163/150 (!) 145/93 (!) 179/57  Pulse: 69 83 81 (!) 53  Resp: 19 (!) 24 (!) 21 16  Temp:    98.4 F (36.9 C)  TempSrc:    Oral  SpO2: 96% 97% 97% 96%  Weight:    68.5 kg  Height:        Intake/Output Summary (Last 24 hours) at 12/21/2018 0742 Last data filed at 12/21/2018 0700 Gross per 24 hour  Intake -  Output 700 ml  Net -700 ml   Last 3 Weights 12/21/2018 12/20/2018 11/26/2018  Weight (lbs) 151 lb 145 lb 148 lb  Weight (kg) 68.493 kg 65.772 kg 67.132 kg     Body mass index is 22.96 kg/m.  General:  Well nourished, well developed, in no acute distress HEENT: normal  Lymph: no adenopathy Neck: no JVD Endocrine:  No thryomegaly Vascular: No carotid bruits; FA pulses 2+ bilaterally without bruits  Cardiac:  normal S1,  S2; RRR; no murmur  Lungs:  clear to auscultation bilaterally, no wheezing, rhonchi or rales  Abd: soft, nontender, no hepatomegaly  Ext: no edema Musculoskeletal:  No deformities, BUE and BLE strength normal and equal Skin: warm and dry  Neuro:  CNs 2-12 intact, no focal abnormalities noted Psych:  Normal affect   EKG:  The EKG was personally reviewed and demonstrates:  Sinus rhythm, intermittent mobitz I and complete heart block Telemetry:  Telemetry was personally reviewed and demonstrates:  Sinus rhythm, mobitz I av block with intermittent complete av block  Relevant CV Studies: TTE 2017 - Left ventricle: The cavity size was normal. Systolic function was   vigorous. The estimated ejection fraction was in the range of 65%   to 70%. Wall motion was normal; there were no regional wall   motion abnormalities. Doppler parameters are consistent with   abnormal left ventricular relaxation (grade 1 diastolic   dysfunction). - Aorta: Ascending aortic diameter: 40 mm (S). - Ascending aorta: The ascending aorta was mildly dilated.  Laboratory Data:  High Sensitivity Troponin:   Recent Labs  Lab 12/20/18 1634 12/20/18 1932  TROPONINIHS 8 8     Chemistry Recent Labs  Lab 12/20/18 1634  NA 140  K 4.2  CL 104  CO2 25  GLUCOSE 97  BUN 22  CREATININE 1.03  CALCIUM 9.0  GFRNONAA >60  GFRAA >60  ANIONGAP 11    No results for input(s): PROT, ALBUMIN, AST, ALT, ALKPHOS, BILITOT in the last 168 hours. Hematology Recent Labs  Lab 12/20/18 1634  WBC 6.6  RBC 4.30  HGB 14.8  HCT 44.1  MCV 102.6*  MCH 34.4*  MCHC 33.6  RDW 13.2  PLT 164   BNPNo results for input(s): BNP, PROBNP in the last 168 hours.  DDimer No results for input(s): DDIMER in the last 168 hours.   Radiology/Studies:  Dg Chest Portable 1 View   Result Date: 12/20/2018 CLINICAL DATA:  Weakness, shortness of breath, coronary artery disease, emphysema, GERD, hypertension EXAM: PORTABLE CHEST 1 VIEW COMPARISON:  Portable exam 1651 hours compared 09/30/2016 FINDINGS: Enlargement of cardiac silhouette. Mediastinal contours and pulmonary vascularity normal. Atherosclerotic calcification aorta. Minimal RIGHT basilar atelectasis. Lungs otherwise clear. No infiltrate, pleural effusion, or pneumothorax. Bones demineralized. IMPRESSION: Enlargement of cardiac silhouette with minimal RIGHT basilar atelectasis. Aortic atherosclerosis. Electronically Signed   By: Ulyses Southward M.William.   On: 12/20/2018 17:15    Assessment and Plan:   1. Complete heart block: Had fall with humerus fracture. Also with symptoms of fatigue intermittently. Has evidence of complete heart block without any rate controlling meds. Due to that, Arin Peral need pacemaker implant. Risks and benefits discussed with the patient and his wife. Risks include bleeding, infection, tamponade, pneumothorax. The patient and wife understand the risks and have agreed to the procedure. The patient is mildly confused this AM and have discussed in depth with the wife. Annah Jasko plan for pacemaker implant Monday.  2. Hypertension: may improve after pacing, continue current management.  3. DVT: restart eliquis post pacemaker  4. PAD: right CEA 2011  5. Hyperlipidemia: simvastatin    For questions or updates, please contact [facility] Please consult [email or web address] for contact info under     Signed, Shylyn Younce Jorja Loa, MD  12/21/2018 7:42 AM

## 2018-12-21 NOTE — Progress Notes (Signed)
  Echocardiogram 2D Echocardiogram has been performed with Definity.  William Mann 12/21/2018, 2:53 PM

## 2018-12-22 MED ORDER — ZOLPIDEM TARTRATE 5 MG PO TABS
5.0000 mg | ORAL_TABLET | Freq: Every evening | ORAL | Status: DC | PRN
  Administered 2018-12-22 – 2018-12-26 (×5): 5 mg via ORAL
  Filled 2018-12-22 (×5): qty 1

## 2018-12-22 NOTE — Progress Notes (Signed)
Progress Note  Patient Name: D Afraz Hula Date of Encounter: 12/22/2018  Primary Cardiologist: No primary care provider on file.   Subjective   Currently feeling well.  No chest pain or shortness of breath.  Inpatient Medications    Scheduled Meds: . influenza vaccine adjuvanted  0.5 mL Intramuscular Tomorrow-1000  . simvastatin  10 mg Oral Daily   Continuous Infusions:  PRN Meds: acetaminophen, nitroGLYCERIN, ondansetron (ZOFRAN) IV   Vital Signs    Vitals:   12/21/18 0259 12/21/18 1538 12/21/18 2036 12/22/18 0325  BP: (!) 179/57 (!) 158/71  (!) 137/57  Pulse: (!) 53 76 94 76  Resp: 16 17 18 16   Temp: 98.4 F (36.9 C) 98 F (36.7 C)  97.8 F (36.6 C)  TempSrc: Oral Oral  Oral  SpO2: 96% 97% 94% 95%  Weight: 68.5 kg   68.2 kg  Height:        Intake/Output Summary (Last 24 hours) at 12/22/2018 0924 Last data filed at 12/22/2018 0725 Gross per 24 hour  Intake 120 ml  Output 975 ml  Net -855 ml   Last 3 Weights 12/22/2018 12/21/2018 12/20/2018  Weight (lbs) 150 lb 5.7 oz 151 lb 145 lb  Weight (kg) 68.2 kg 68.493 kg 65.772 kg      Telemetry    Sinus rhythm, intermittent 2-1 AV block- Personally Reviewed  ECG    None new- Personally Reviewed  Physical Exam   GEN: No acute distress.   Neck: No JVD Cardiac: RRR, no murmurs, rubs, or gallops.  Respiratory: Clear to auscultation bilaterally. GI: Soft, nontender, non-distended  MS: No edema; No deformity. Neuro:  Nonfocal  Psych: Normal affect   Labs    High Sensitivity Troponin:   Recent Labs  Lab 12/20/18 1634 12/20/18 1932  TROPONINIHS 8 8      Chemistry Recent Labs  Lab 12/20/18 1634  NA 140  K 4.2  CL 104  CO2 25  GLUCOSE 97  BUN 22  CREATININE 1.03  CALCIUM 9.0  GFRNONAA >60  GFRAA >60  ANIONGAP 11     Hematology Recent Labs  Lab 12/20/18 1634  WBC 6.6  RBC 4.30  HGB 14.8  HCT 44.1  MCV 102.6*  MCH 34.4*  MCHC 33.6  RDW 13.2  PLT 164    BNPNo results for  input(s): BNP, PROBNP in the last 168 hours.   DDimer No results for input(s): DDIMER in the last 168 hours.   Radiology    Dg Chest Portable 1 View  Result Date: 12/20/2018 CLINICAL DATA:  Weakness, shortness of breath, coronary artery disease, emphysema, GERD, hypertension EXAM: PORTABLE CHEST 1 VIEW COMPARISON:  Portable exam 1651 hours compared 09/30/2016 FINDINGS: Enlargement of cardiac silhouette. Mediastinal contours and pulmonary vascularity normal. Atherosclerotic calcification aorta. Minimal RIGHT basilar atelectasis. Lungs otherwise clear. No infiltrate, pleural effusion, or pneumothorax. Bones demineralized. IMPRESSION: Enlargement of cardiac silhouette with minimal RIGHT basilar atelectasis. Aortic atherosclerosis. Electronically Signed   By: Lavonia Dana M.D.   On: 12/20/2018 17:15    Cardiac Studies   TTE 12/21/18  1. Left ventricular ejection fraction, by visual estimation, is 60 to 65%. The left ventricle has normal function. There is severely increased left ventricular hypertrophy.  2. Definity contrast agent was given IV to delineate the left ventricular endocardial borders.  3. Left ventricular diastolic parameters are indeterminate.  4. Global right ventricle has normal systolic function.The right ventricular size is normal. No increase in right ventricular wall thickness.  5. Left atrial  size was normal.  6. Right atrial size was normal.  7. Mild mitral annular calcification.  8. The mitral valve is abnormal. Trace mitral valve regurgitation.  9. The tricuspid valve is grossly normal. Tricuspid valve regurgitation is trivial. 10. The aortic valve is tricuspid. Aortic valve regurgitation is not visualized. Mild aortic valve sclerosis without stenosis. 11. The pulmonic valve was grossly normal. Pulmonic valve regurgitation is not visualized. 12. Aortic dilatation noted. 13. There is mild to moderate dilatation of the ascending aorta measuring 42 mm.  Patient Profile      85 y.o. male with a history of PAD, hypertension, hyperlipidemia, BPH, GERD presented to the hospital with weakness and fatigue, found to have intermittent complete heart block  Assessment & Plan    1.  Intermittent complete heart block: We Chong January plan for pacemaker implant tomorrow.  He has had multiple falls 1 of which resulted in a humerus fracture.  He is on no rate controlling medications.  He is no longer confused as he was yesterday.  He has been overall conducting one-to-one with intermittent episodes of 2-1 AV block.  2.  Hypertension: Currently well controlled  3.  DVT: Restart Eliquis post pacemaker  4.  PAD: Right CEA 2011  5.  Hyperlipidemia: Continue simvastatin  For questions or updates, please contact [place] Please consult [email or web address] for contact info under        Signed, Jada Kuhnert Meredith Leeds, MD  12/22/2018, 9:24 AM

## 2018-12-23 ENCOUNTER — Encounter (HOSPITAL_COMMUNITY)
Admission: EM | Disposition: A | Discharge status: Skilled Nursing Facility | Payer: Self-pay | Source: Home / Self Care | Attending: Cardiology

## 2018-12-23 DIAGNOSIS — I441 Atrioventricular block, second degree: Secondary | ICD-10-CM

## 2018-12-23 HISTORY — PX: PACEMAKER IMPLANT: EP1218

## 2018-12-23 LAB — SURGICAL PCR SCREEN
MRSA, PCR: NEGATIVE
Staphylococcus aureus: NEGATIVE

## 2018-12-23 SURGERY — PACEMAKER IMPLANT

## 2018-12-23 MED ORDER — ONDANSETRON HCL 4 MG/2ML IJ SOLN: 4.0000 mg | Freq: Four times a day (QID) | INTRAMUSCULAR | Status: DC | PRN

## 2018-12-23 MED ORDER — HEPARIN (PORCINE) IN NACL 1000-0.9 UT/500ML-% IV SOLN
INTRAVENOUS | Status: DC | PRN
  Administered 2018-12-23: 500 mL

## 2018-12-23 MED ORDER — SODIUM CHLORIDE 0.9 % IV SOLN
80.0000 mg | INTRAVENOUS | Status: AC
  Administered 2018-12-23: 80 mg
  Filled 2018-12-23: qty 2

## 2018-12-23 MED ORDER — SODIUM CHLORIDE 0.9 % IV SOLN
INTRAVENOUS | Status: DC
  Administered 2018-12-23: 12:00:00 via INTRAVENOUS

## 2018-12-23 MED ORDER — ACETAMINOPHEN 325 MG PO TABS: 325.0000 mg | ORAL_TABLET | ORAL | Status: DC | PRN

## 2018-12-23 MED ORDER — LIDOCAINE HCL (PF) 1 % IJ SOLN
INTRAMUSCULAR | Status: DC | PRN
  Administered 2018-12-23: 60 mL

## 2018-12-23 MED ORDER — SODIUM CHLORIDE 0.9 % IV SOLN
INTRAVENOUS | Status: AC
  Filled 2018-12-23: qty 2

## 2018-12-23 MED ORDER — HEPARIN (PORCINE) IN NACL 1000-0.9 UT/500ML-% IV SOLN
INTRAVENOUS | Status: AC
  Filled 2018-12-23: qty 500

## 2018-12-23 MED ORDER — CEFAZOLIN SODIUM-DEXTROSE 1-4 GM/50ML-% IV SOLN
1.0000 g | Freq: Four times a day (QID) | INTRAVENOUS | Status: AC
  Administered 2018-12-23 – 2018-12-24 (×3): 1 g via INTRAVENOUS
  Filled 2018-12-23 (×4): qty 50

## 2018-12-23 MED ORDER — CEFAZOLIN SODIUM-DEXTROSE 2-4 GM/100ML-% IV SOLN
INTRAVENOUS | Status: AC
  Filled 2018-12-23: qty 100

## 2018-12-23 MED ORDER — LIDOCAINE HCL 1 % IJ SOLN
INTRAMUSCULAR | Status: AC
  Filled 2018-12-23: qty 60

## 2018-12-23 MED ORDER — CEFAZOLIN SODIUM-DEXTROSE 2-4 GM/100ML-% IV SOLN
2.0000 g | INTRAVENOUS | Status: AC
  Administered 2018-12-23: 2 g via INTRAVENOUS

## 2018-12-23 SURGICAL SUPPLY — 7 items
CABLE SURGICAL S-101-97-12 (CABLE) ×3 IMPLANT
LEAD TENDRIL MRI 52CM LPA1200M (Lead) ×3 IMPLANT
LEAD TENDRIL MRI 58CM LPA1200M (Lead) ×3 IMPLANT
PACEMAKER ASSURITY DR-RF (Pacemaker) ×3 IMPLANT
PAD PRO RADIOLUCENT 2001M-C (PAD) ×3 IMPLANT
SHEATH 8FR PRELUDE SNAP 13 (SHEATH) ×6 IMPLANT
TRAY PACEMAKER INSERTION (PACKS) ×3 IMPLANT

## 2018-12-23 NOTE — Discharge Instructions (Signed)
° ° °  Supplemental Discharge Instructions for  Pacemaker/Defibrillator Patients  Activity No heavy lifting or vigorous activity with your left/right arm for 6 to 8 weeks.  Do not raise your left/right arm above your head for one week.  Gradually raise your affected arm as drawn below.           __        11/20                     11/21                     11/22                       11/23   WOUND CARE - Keep the wound area clean and dry.  Do not get this area wet for one week. No showers for one week; you may shower on 11/23   . - The tape/steri-strips on your wound will fall off; do not pull them off.  No bandage is needed on the site.  DO  NOT apply any creams, oils, or ointments to the wound area. - If you notice any drainage or discharge from the wound, any swelling or bruising at the site, or you develop a fever > 101? F after you are discharged home, call the office at once.  Special Instructions - You are still able to use cellular telephones; use the ear opposite the side where you have your pacemaker/defibrillator.  Avoid carrying your cellular phone near your device. - When traveling through airports, show security personnel your identification card to avoid being screened in the metal detectors.  Ask the security personnel to use the hand wand. - Avoid arc welding equipment, MRI testing (magnetic resonance imaging), TENS units (transcutaneous nerve stimulators).  Call the office for questions about other devices. - Avoid electrical appliances that are in poor condition or are not properly grounded. - Microwave ovens are safe to be near or to operate.

## 2018-12-23 NOTE — Progress Notes (Addendum)
Electrophysiology Rounding Note  Patient Name: William Mann Date of Encounter: 12/23/2018  Primary Cardiologist: Tamala Julian Electrophysiologist: Lovena Le   Subjective   The patient is doing well today.  At this time, the patient denies chest pain, shortness of breath, or any new concerns.  Inpatient Medications    Scheduled Meds: . simvastatin  10 mg Oral Daily   Continuous Infusions:  PRN Meds: acetaminophen, nitroGLYCERIN, ondansetron (ZOFRAN) IV, zolpidem   Vital Signs    Vitals:   12/22/18 0325 12/22/18 1415 12/22/18 1930 12/23/18 0609  BP: (!) 137/57 129/63 110/74 (!) 146/87  Pulse: 76  64 66  Resp: 16  17 (!) 21  Temp: 97.8 F (36.6 C) 98.3 F (36.8 C) 98 F (36.7 C) 98.3 F (36.8 C)  TempSrc: Oral Oral Oral Oral  SpO2: 95%  95% 96%  Weight: 68.2 kg   67.2 kg  Height:        Intake/Output Summary (Last 24 hours) at 12/23/2018 [id number] Last data filed at 12/23/2018 0230 Gross per 24 hour  Intake 600 ml  Output 700 ml  Net -100 ml   Filed Weights   12/21/18 0259 12/22/18 0325 12/23/18 0609  Weight: 68.5 kg 68.2 kg 67.2 kg    Physical Exam    GEN- The patient is elderly appearing, alert and oriented x 3 today.   Head- normocephalic, atraumatic Eyes-  Sclera clear, conjunctiva pink Ears- hearing intact Oropharynx- clear Neck- supple Lungs- Clear to ausculation bilaterally, normal work of breathing Heart- Regular rate and rhythm  GI- soft, NT, ND, + BS Extremities- no clubbing, cyanosis, or edema Skin- no rash or lesion Psych- euthymic mood, full affect Neuro- strength and sensation are intact  Labs    CBC Recent Labs    12/20/18 1634  WBC 6.6  HGB 14.8  HCT 44.1  MCV 102.6*  PLT 123456   Basic Metabolic Panel Recent Labs    12/20/18 1634 12/20/18 1932  NA 140  --   K 4.2  --   CL 104  --   CO2 25  --   GLUCOSE 97  --   BUN 22  --   CREATININE 1.03  --   CALCIUM 9.0  --   MG  --  2.2   Thyroid Function Tests Recent Labs   12/20/18 1932  TSH 2.417    Telemetry    Sinus rhythm, Mobitz I (personally reviewed)  Radiology    No results found.     Assessment & Plan    1.  Advanced conduction system disease He has advanced conduction system disease with documented Mobitz I heart block and intermittent complete heart block No AVN blocking agents Risks, benefits to pacemaker implant reviewed with patient who wishes to proceed. Will plan for later today He has fractured his right shoulder - will discuss with Dr Lovena Le pacemaker location - he is not aware of plans for surgery but has limited ROM.   2.  Dizziness Some episodes sound like vertigo He is clear that he did not have frank syncope when he fractured his humerus - he was turning to get a chair and "the world started spinning"  3.  HTN Stable No change required today   For questions or updates, please contact [place] Please consult [email or web address] for contact info under Cardiology/STEMI.  Signed, Chanetta Marshall, NP  12/23/2018, 7:12 AM   EP Attending  Patient seen and examined. Agree with the findings as noted above. The patient has symptomatic high grade  AV block. I have discussed the indications/risks/benefits/goals/expectations of PPM insertion and he wishes to proceed.  Mikle Bosworth.William.

## 2018-12-23 NOTE — Plan of Care (Signed)
  Problem: Clinical Measurements: Goal: Respiratory complications will improve Outcome: Progressing Note: Stable on room air.  No s/s of respiratory complications noted.   Problem: Pain Managment: Goal: General experience of comfort will improve Outcome: Progressing Note: Resting comfortably with no s/s of pain or discomfort.

## 2018-12-23 NOTE — H&P (View-Only) (Signed)
Electrophysiology Rounding Note  Patient Name: William Mann Date of Encounter: 12/23/2018  Primary Cardiologist: Tamala Julian Electrophysiologist: Lovena Le   Subjective   The patient is doing well today.  At this time, the patient denies chest pain, shortness of breath, or any new concerns.  Inpatient Medications    Scheduled Meds: . simvastatin  10 mg Oral Daily   Continuous Infusions:  PRN Meds: acetaminophen, nitroGLYCERIN, ondansetron (ZOFRAN) IV, zolpidem   Vital Signs    Vitals:   12/22/18 0325 12/22/18 1415 12/22/18 1930 12/23/18 0609  BP: (!) 137/57 129/63 110/74 (!) 146/87  Pulse: 76  64 66  Resp: 16  17 (!) 21  Temp: 97.8 F (36.6 C) 98.3 F (36.8 C) 98 F (36.7 C) 98.3 F (36.8 C)  TempSrc: Oral Oral Oral Oral  SpO2: 95%  95% 96%  Weight: 68.2 kg   67.2 kg  Height:        Intake/Output Summary (Last 24 hours) at 12/23/2018 [id number] Last data filed at 12/23/2018 0230 Gross per 24 hour  Intake 600 ml  Output 700 ml  Net -100 ml   Filed Weights   12/21/18 0259 12/22/18 0325 12/23/18 0609  Weight: 68.5 kg 68.2 kg 67.2 kg    Physical Exam    GEN- The patient is elderly appearing, alert and oriented x 3 today.   Head- normocephalic, atraumatic Eyes-  Sclera clear, conjunctiva pink Ears- hearing intact Oropharynx- clear Neck- supple Lungs- Clear to ausculation bilaterally, normal work of breathing Heart- Regular rate and rhythm  GI- soft, NT, ND, + BS Extremities- no clubbing, cyanosis, or edema Skin- no rash or lesion Psych- euthymic mood, full affect Neuro- strength and sensation are intact  Labs    CBC Recent Labs    12/20/18 1634  WBC 6.6  HGB 14.8  HCT 44.1  MCV 102.6*  PLT 123456   Basic Metabolic Panel Recent Labs    12/20/18 1634 12/20/18 1932  NA 140  --   K 4.2  --   CL 104  --   CO2 25  --   GLUCOSE 97  --   BUN 22  --   CREATININE 1.03  --   CALCIUM 9.0  --   MG  --  2.2   Thyroid Function Tests Recent Labs   12/20/18 1932  TSH 2.417    Telemetry    Sinus rhythm, Mobitz I (personally reviewed)  Radiology    No results found.     Assessment & Plan    1.  Advanced conduction system disease He has advanced conduction system disease with documented Mobitz I heart block and intermittent complete heart block No AVN blocking agents Risks, benefits to pacemaker implant reviewed with patient who wishes to proceed. Will plan for later today He has fractured his right shoulder - will discuss with Dr Lovena Le pacemaker location - he is not aware of plans for surgery but has limited ROM.   2.  Dizziness Some episodes sound like vertigo He is clear that he did not have frank syncope when he fractured his humerus - he was turning to get a chair and "the world started spinning"  3.  HTN Stable No change required today   For questions or updates, please contact [place] Please consult [email or web address] for contact info under Cardiology/STEMI.  Signed, Chanetta Marshall, NP  12/23/2018, 7:12 AM   EP Attending  Patient seen and examined. Agree with the findings as noted above. The patient has symptomatic high grade  AV block. I have discussed the indications/risks/benefits/goals/expectations of PPM insertion and he wishes to proceed.  Mikle Bosworth.William.

## 2018-12-23 NOTE — Progress Notes (Signed)
Orthopedic Tech Progress Note Patient Details:  William Mann [birth year] [id number] Patient already has arm sling. Patient ID: [name], male   DOB: 1929-08-14, 85 y.o.   MRN: [id number]   [name] 12/23/2018, 3:12 PM

## 2018-12-23 NOTE — Interval H&P Note (Signed)
History and Physical Interval Note:  12/23/2018 1:25 PM  [name]  has presented today for surgery, with the diagnosis of HEART BLOCK.  The various methods of treatment have been discussed with the patient and family. After consideration of risks, benefits and other options for treatment, the patient has consented to  Procedure(s): PACEMAKER IMPLANT (N/A) as a surgical intervention.  The patient's history has been reviewed, patient examined, no change in status, stable for surgery.  I have reviewed the patient's chart and labs.  Questions were answered to the patient's satisfaction.     William Mann

## 2018-12-24 ENCOUNTER — Encounter (HOSPITAL_COMMUNITY): Payer: Self-pay | Admitting: Internal Medicine

## 2018-12-24 ENCOUNTER — Inpatient Hospital Stay (HOSPITAL_COMMUNITY): Payer: Medicare Other

## 2018-12-24 MED ORDER — APIXABAN 5 MG PO TABS: 5.0000 mg | ORAL_TABLET | Freq: Two times a day (BID) | ORAL | 1 refills | Status: AC

## 2018-12-24 MED ORDER — ALUM & MAG HYDROXIDE-SIMETH 200-200-20 MG/5ML PO SUSP
30.0000 mL | ORAL | Status: DC | PRN
  Administered 2018-12-24 – 2018-12-26 (×2): 30 mL via ORAL
  Filled 2018-12-24 (×2): qty 30

## 2018-12-24 MED FILL — Lidocaine HCl Local Inj 1%: INTRAMUSCULAR | Qty: 60 | Status: AC

## 2018-12-24 NOTE — Discharge Summary (Addendum)
ELECTROPHYSIOLOGY PROCEDURE DISCHARGE SUMMARY    Patient ID: William Mann,  MRN: [id number], DOB/AGE: 1929/05/02 85 y.o.  Admit date: 12/20/2018 Discharge date: 12/27/2018  Primary Care Physician: Antony Contras, MD Primary Cardiologist: Tamala Julian Electrophysiologist: Lovena Le  Primary Discharge Diagnosis:  Symptomatic bradycardia status post pacemaker implantation this admission  Secondary Discharge Diagnosis:  1.  PAD 2.  S/p CEA 3.  HTN 4.  Hyperlipidemia 5.  BPH 6.  GERD  Allergies  Allergen Reactions  . Atorvastatin Other (See Comments)    Liver inflammation  Liver inflammation   . Other   . Rosuvastatin Other (See Comments)    Myalgia  Myalgia  . Viagra [Sildenafil]      Procedures This Admission:  1.  Implantation of a STJ dual chamber PPM on 12/23/18 by Dr Lovena Le.  See op note for full details. There were no immediate post procedure complications. 2.  CXR on 12/24/18 demonstrated no pneumothorax status post device implantation.   Brief HPI/Hospital Course:  William Mann is a 85 y.o. male with a past medical history as outlined above.  He has had falls as well as intermittent dizzy spells and fatigue for which he presented to the ER for evaluation.  He was found to have Mobitz I as well as intermittent complete heart block.   The patient has had symptomatic bradycardia without reversible causes identified.  Risks, benefits, and alternatives to PPM implantation were reviewed with the patient who wished to proceed.   The patient underwent implantation of a STJ dual chamber pacemaker with details as outlined above.  Left chest was without hematoma or ecchymosis.  The device was interrogated and found to be functioning normally.  CXR was obtained and demonstrated no pneumothorax status post device implantation.  Wound care, arm mobility, and restrictions were reviewed with the patient.  The patient was examined by Dr Lovena Le and considered stable for discharge to  home.   Pt was examined 12/24/2018 by Dr. Lovena Le and determined stable for discharge. CXR reviewed and was stable. Pt was kept until 12/27/2018 due to lack of bed and issues with insurance.  Pt offered bed at [facility]. He was examined am of 12/27/2018 and determined stable for discharge with follow up as below.   SNF PLEASE NOTE. Eliquis is ordered to restart with EVENING dose on 12/27/2018.  Physical Exam: Vitals:   12/26/18 1941 12/26/18 2037 12/27/18 0427 12/27/18 0429  BP:  106/75 (!) 151/86 (!) 145/88  Pulse:  (!) 102 89 89  Resp: 19 19 17    Temp:  99.1 F (37.3 C) 98.2 F (36.8 C)   TempSrc:  Oral Oral   SpO2:  95% 97%   Weight:      Height:       General:  Elderly appearing. No resp difficulty. HEENT: Normal Neck: Supple. Carotids 2+ bilat; no bruits. No thyromegaly or nodule noted. Cor: PMI nondisplaced. RRR, No M/G/R noted. PPM site stable.  Lungs: CTAB, normal effort. Abdomen: Soft, non-tender, non-distended, no HSM. No bruits or masses. +BS   Extremities: No cyanosis, clubbing, or rash.  Neuro: Alert & orientedx3, cranial nerves grossly intact. moves all 4 extremities w/o difficulty. Affect flat but appropriate.   Labs:   Lab Results  Component Value Date   WBC 6.6 12/20/2018   HGB 14.8 12/20/2018   HCT 44.1 12/20/2018   MCV 102.6 (H) 12/20/2018   PLT 164 12/20/2018    Recent Labs  Lab 12/20/18 1634  NA 140  K  4.2  CL 104  CO2 25  BUN 22  CREATININE 1.03  CALCIUM 9.0  GLUCOSE 97    Discharge Medications:  Allergies as of 12/27/2018      Reactions   Atorvastatin Other (See Comments)   Liver inflammation  Liver inflammation    Other    Rosuvastatin Other (See Comments)   Myalgia Myalgia   Viagra [sildenafil]       Medication List    TAKE these medications   apixaban 5 MG Tabs tablet Commonly known as: Eliquis Take 1 tablet (5 mg total) by mouth 2 (two) times daily. Resume 12/27/18 evening dose What changed: additional  instructions   aspirin EC 81 MG tablet Take 81 mg by mouth daily.   CALTRATE 600 PO Take 1 tablet by mouth daily.   CoQ-10 100 MG Caps Take 1 capsule by mouth daily.   cyanocobalamin 100 MCG tablet Take 100 mcg by mouth daily.   multivitamin tablet Take 1 tablet by mouth daily.   simvastatin 10 MG tablet Commonly known as: ZOCOR Take 10 mg by mouth daily.   tamsulosin 0.4 MG Caps capsule Commonly known as: FLOMAX Take 0.4 mg by mouth daily.   VITAMIN William-3 PO Take 1 tablet by mouth daily.       Disposition:   Follow-up Information    [place] [facility] Follow up on 01/09/2019.   Specialty: Cardiology Why: at [facility] information: [address], [address] [place]       Evans Lance, MD Follow up on 03/24/2019.   Specialty: Cardiology Why: at 3:15PM  Contact information: [address] [address] [phone]           Duration of Discharge Encounter: Greater than 30 minutes including physician time.  Jacalyn Lefevre, PA-C  12/27/2018 6:58 AM   EP Attending  Agree with above. He has been stable for DC for several days. Usual followup.  Mikle Bosworth.William.

## 2018-12-24 NOTE — TOC Progression Note (Signed)
Transition of Care [facility]) - Progression Note    Patient Details  Name: Keturah Barre Pranish Pinks MRN: [id number] Date of Birth: 10/24/1929  Transition of Care [facility]) CM/SW Contact  Graves-Bigelow, Ocie Cornfield, RN Phone Number: 12/24/2018, 2:22 PM  Clinical Narrative:  No bed availabilities at [place] and [place] at this time. CM discussed the alternative option of Home with [facility]-out of pocket cost vs long-term care policy. Family is aware to start working on this avenue. CM did ask MD to place a consult for CIR to see if patient is appropriate for CIR. CM will continue to follow for transition of care needs.     Expected Discharge Plan: [place] Barriers to Discharge: No Barriers Identified  Expected Discharge Plan and Services Expected Discharge Plan: [place] In-house Referral: NA Discharge Planning Services: CM Consult Post Acute Care Choice: [place] Living arrangements for the past 2 months: Single Family Home Expected Discharge Date: 12/24/18      Social Determinants of Health (SDOH) Interventions    Readmission Risk Interventions No flowsheet data found.

## 2018-12-24 NOTE — NC FL2 (Signed)
  [place] LEVEL OF CARE SCREENING TOOL     IDENTIFICATION  Patient Name: William Mann Birthdate: Oct 29, 1929 Sex: male Admission Date (Current Location): 12/20/2018  [facility] and [place] Number:  Herbalist and Address:         Provider Number: [id number]  Attending Physician Name and Address:  Dorothy Spark, MD  Relative Name and Phone Number:  [facility] [phone]    Current Level of Care: Hospital Recommended Level of Care: [place] Prior Approval Number:    Date Approved/Denied:   PASRR Number: [id number]  Discharge Plan: SNF    Current Diagnoses: Patient Active Problem List   Diagnosis Date Noted  . Complete heart block ([place]) 12/20/2018  . Aftercare following surgery of the circulatory system 02/17/2014  . Bilateral carotid artery occlusion 02/17/2014  . Aftercare following surgery of the circulatory system, [place] 01/17/2013  . Numbness and tingling 02/27/2012  . Peripheral vascular disease, unspecified ([place]) 02/27/2012  . Occlusion and stenosis of carotid artery without mention of cerebral infarction 03/14/2011    Orientation RESPIRATION BLADDER Height & Weight     Self, Time, Situation, Place  Normal Continent Weight: 66.8 kg Height:  5\' 8"  (172.7 cm)  BEHAVIORAL SYMPTOMS/MOOD NEUROLOGICAL BOWEL NUTRITION STATUS      Continent Diet(Heart Healthy)  AMBULATORY STATUS COMMUNICATION OF NEEDS Skin   Extensive Assist   Normal                       Personal Care Assistance Level of Assistance  Bathing Bathing Assistance: Limited assistance(Post pacemaker- Left arm sling)         Functional Limitations Info  Sight Sight Info: Impaired(wears glasses)        SPECIAL CARE FACTORS FREQUENCY  PT (By licensed PT), OT (By licensed OT)     PT Frequency: 5 x week OT Frequency: 5 x week            Contractures Contractures Info: Not present    Additional Factors Info   Code Status, Allergies, Psychotropic Code Status Info: Full Code Allergies Info: Viagra, Atorvastatin, Rosuvastatin, ohter not specified.           Current Medications (12/24/2018):  This is the current hospital active medication list Current Facility-Administered Medications  Medication Dose Route Frequency Provider Last Rate Last Dose  . acetaminophen (TYLENOL) tablet 325-650 mg  325-650 mg Oral Q4H PRN Evans Lance, MD      . nitroGLYCERIN (NITROSTAT) SL tablet 0.4 mg  0.4 mg Sublingual Q5 Min x 3 PRN Evans Lance, MD      . ondansetron [facility]) injection 4 mg  4 mg Intravenous Q6H PRN Evans Lance, MD      . simvastatin (ZOCOR) tablet 10 mg  10 mg Oral Daily Evans Lance, MD   10 mg at 12/24/18 0817  . zolpidem (AMBIEN) tablet 5 mg  5 mg Oral QHS PRN Evans Lance, MD   5 mg at 12/23/18 2117     Discharge Medications: Please see discharge summary for a list of discharge medications.  Relevant Imaging Results:  Relevant Lab Results:   Additional Information [phone]  Adelfa Koh Ocie Cornfield, RN

## 2018-12-24 NOTE — Progress Notes (Signed)
Patient's HR has been over 110s and had one episode of 140s NST.  MD oncall was made of aware of event.  I will keep monitoring patient.

## 2018-12-24 NOTE — Care Management Important Message (Signed)
Important Message  Patient Details  Name: Arpit Bashir MRN: [id number] Date of Birth: 13-Mar-1929   Medicare Important Message Given:  Yes     Shelda Altes 12/24/2018, 1:37 PM

## 2018-12-24 NOTE — Evaluation (Addendum)
Physical Therapy Evaluation Patient Details Name: William Mann MRN: [id number] DOB: [birth year] Today's Date: 12/24/2018   History of Present Illness  85 y.o. male with a history of coronary artery calcification noted on chest CT in 2016, PAD s/p right CEA in 02/2009, hypertension, hyperlipidemia, BPH, GERD, thyroid nodule, and insomnia who presents to the ED with weakness, dizziness, and palpitations and EKG concerning for complete heart block. s/p pacemaker implantation 12/23/18. Per chart, pt had R humeral neck fracture on 11/26/18 after a fall, pt unable to recall orthopedic MD's recommendations, pt stated he doesn't wear a sling on the R.  Clinical Impression  Pt admitted with above diagnosis. Max assist for bed mobility and for stand pivot transfer to recliner. Pt has heavy posterior lean in sitting and standing. He is at high risk for falls. ST-SNF recommended. HR 125 max with activity.  Pt currently with functional limitations due to the deficits listed below (see PT Problem List). Pt will benefit from skilled PT to increase their independence and safety with mobility to allow discharge to the venue listed below.       Follow Up Recommendations SNF    Equipment Recommendations  None recommended by PT    Recommendations for Other Services   OT (order placed)    Precautions / Restrictions Precautions Precautions: ICD/Pacemaker Precaution Comments: LUE in sling; recent R humeral neck fx on 11/26/18 (unclear what orthopedic recommendations were, pt cannot recall) Required Braces or Orthoses: Sling Restrictions Weight Bearing Restrictions: Yes RUE Weight Bearing: Non weight bearing Other Position/Activity Restrictions: assume NWB RUE 2* recent humerus fx, need to clarify      Mobility  Bed Mobility Overal bed mobility: Needs Assistance Bed Mobility: Supine to Sit     Supine to sit: Max assist     General bed mobility comments: assist to raise trunk, pt  30%  Transfers Overall transfer level: Needs assistance   Transfers: Sit to/from Stand;Stand Pivot Transfers Sit to Stand: Max assist Stand pivot transfers: Max assist       General transfer comment: assist to rise and pivot with bear hug technique, pt has posterior lean, no buckling noted  HR 125 with activity, 120 at rest  Ambulation/Gait             General Gait Details: unable  Stairs            Wheelchair Mobility    Modified Rankin (Stroke Patients Only)       Balance Overall balance assessment: Needs assistance   Sitting balance-Leahy Scale: Poor Sitting balance - Comments: posterior lean requiring mod to S Postural control: Posterior lean Standing balance support: Single extremity supported Standing balance-Leahy Scale: Poor Standing balance comment: posterior lean                             Pertinent Vitals/Pain Pain Assessment: No/denies pain    Home Living Family/patient expects to be discharged to:: Private residence Living Arrangements: Spouse/significant other Available Help at Discharge: Family   Home Access: Stairs to enter Entrance Stairs-Rails: Right Entrance Stairs-Number of Steps: 4   Home Equipment: Cane - single point;Shower seat      Prior Function Level of Independence: Independent with assistive device(s)         Comments: pt stated he walked with a cane, pt unreliable historian, need to clarify     Hand Dominance   Dominant Hand: Left    Extremity/Trunk Assessment   Upper  Extremity Assessment Upper Extremity Assessment: Defer to OT evaluation(pt reports intermittent numbness in 4-5th digits R hand)    Lower Extremity Assessment Lower Extremity Assessment: Generalized weakness(B knee ext +4/5)    Cervical / Trunk Assessment Cervical / Trunk Assessment: Kyphotic  Communication   Communication: HOH  Cognition Arousal/Alertness: Awake/alert Behavior During Therapy: WFL for tasks  assessed/performed Overall Cognitive Status: Within Functional Limits for tasks assessed                                        General Comments      Exercises     Assessment/Plan    PT Assessment Patient needs continued PT services  PT Problem List Decreased activity tolerance;Decreased balance;Decreased cognition;Decreased mobility       PT Treatment Interventions Therapeutic activities;Therapeutic exercise;Functional mobility training;Gait training;Balance training;Patient/family education    PT Goals (Current goals can be found in the Care Plan section)  Acute Rehab PT Goals Patient Stated Goal: to be able to walk PT Goal Formulation: With patient Time For Goal Achievement: 01/14/19 Potential to Achieve Goals: Fair    Frequency Min 3X/week   Barriers to discharge        Co-evaluation               AM-PAC PT "6 Clicks" Mobility  Outcome Measure Help needed turning from your back to your side while in a flat bed without using bedrails?: A Lot Help needed moving from lying on your back to sitting on the side of a flat bed without using bedrails?: A Lot Help needed moving to and from a bed to a chair (including a wheelchair)?: A Lot Help needed standing up from a chair using your arms (e.g., wheelchair or bedside chair)?: A Lot Help needed to walk in hospital room?: Total Help needed climbing 3-5 steps with a railing? : Total 6 Click Score: 10    End of Session Equipment Utilized During Treatment: Gait belt Activity Tolerance: Patient limited by fatigue Patient left: in chair;with call bell/phone within reach;with nursing/sitter in room Nurse Communication: Mobility status;Precautions PT Visit Diagnosis: Difficulty in walking, not elsewhere classified (R26.2);History of falling (Z91.81)    Time: [id number] PT Time Calculation (min) (ACUTE ONLY): 31 min   Charges:   PT Evaluation $PT Eval Moderate Complexity: 1 Mod PT  Treatments $Therapeutic Activity: 8-22 mins        Blondell Reveal Kistler PT 12/24/2018  Acute Rehabilitation Services Pager [phone] Office [phone]

## 2018-12-24 NOTE — TOC Initial Note (Signed)
Transition of Care [facility]) - Initial/Assessment Note    Patient Details  Name: William Mann MRN: [id number] Date of Birth: Oct 17, 1929  Transition of Care [facility]) CM/SW Contact:    Bethena Roys, RN Phone Number: 12/24/2018, 11:49 AM  Clinical Narrative:  Patient status post pacemaker- PTA from home with support of wife. PT worked with patient and recommendations for SNF- patient is agreeable faxed out to [facility] and [facility]. Patient states if he can't get in those two facilities he would like to return home. CM did compete FL2 and information faxed via the hub. Awaiting to hear back from facilities. CM did discuss personal care services with the out of pocket cost for this option. Patient states he has a long term care policy as well. CM did ask wife to look into the policy to see when it can be initiated from the hospitalization. CM will continue to follow for transition of care needs.                   Expected Discharge Plan: Skilled Nursing Facility Barriers to Discharge: No Barriers Identified   Patient Goals and CMS Choice Patient states their goals for this hospitalization and ongoing recovery are:: "to be able to return home" CMS Medicare.gov Compare Post Acute Care list provided to:: Patient Choice offered to / list presented to : Patient  Expected Discharge Plan and Services Expected Discharge Plan: [place] In-house Referral: NA Discharge Planning Services: CM Consult Post Acute Care Choice: [place] Living arrangements for the past 2 months: Single Family Home Expected Discharge Date: 12/24/18     Prior Living Arrangements/Services Living arrangements for the past 2 months: Single Family Home Lives with:: Spouse Patient language and need for interpreter reviewed:: Yes Do you feel safe going back to the place where you live?: Yes      Need for Family Participation in Patient Care: Yes (Comment) Care giver support system  in place?: Yes (comment)   Criminal Activity/Legal Involvement Pertinent to Current Situation/Hospitalization: No - Comment as needed  Activities of Daily Living Home Assistive Devices/Equipment: Cane (specify quad or straight), Eyeglasses ADL Screening (condition at time of admission) Patient's cognitive ability adequate to safely complete daily activities?: Yes Is the patient deaf or have difficulty hearing?: Yes Does the patient have difficulty seeing, even when wearing glasses/contacts?: No Does the patient have difficulty concentrating, remembering, or making decisions?: Yes Patient able to express need for assistance with ADLs?: No Does the patient have difficulty dressing or bathing?: No Independently performs ADLs?: Yes (appropriate for developmental age) Does the patient have difficulty walking or climbing stairs?: Yes Weakness of Legs: Both Weakness of Arms/Hands: Both  Permission Sought/Granted Permission sought to share information with : Facility Sport and exercise psychologist, Family Supports Permission granted to share information with : Yes, Verbal Permission Granted     Permission granted to share info w AGENCY: [facility]        Emotional Assessment Appearance:: Appears stated age Attitude/Demeanor/Rapport: Engaged Affect (typically observed): Accepting Orientation: : Oriented to Self, Oriented to Place, Oriented to  Time, Oriented to Situation Alcohol / Substance Use: Not Applicable Psych Involvement: No (comment)  Admission diagnosis:  heart block Patient Active Problem List   Diagnosis Date Noted  . Complete heart block ([place]) 12/20/2018  . Aftercare following surgery of the circulatory system 02/17/2014  . Bilateral carotid artery occlusion 02/17/2014  . Aftercare following surgery of the circulatory system, [place] 01/17/2013  . Numbness and tingling 02/27/2012  .  Peripheral vascular disease, unspecified ([place]) 02/27/2012  . Occlusion and stenosis of  carotid artery without mention of cerebral infarction 03/14/2011   PCP:  Antony Contras, MD Pharmacy:   [facility] [phone] - [place], [address] AT [facility] OF [address] [place] [address] Phone: [phone] Fax: [place], [place] [address] Phone: [phone] Fax: [phone]     Social Determinants of Health (SDOH) Interventions    Readmission Risk Interventions No flowsheet data found.

## 2018-12-25 LAB — SARS CORONAVIRUS 2 (TAT 6-24 HRS): SARS Coronavirus 2: NEGATIVE

## 2018-12-25 NOTE — Evaluation (Signed)
Occupational Therapy Evaluation Patient Details Name: William Mann MRN: [id number] DOB: [birth year] Today's Date: 12/25/2018    History of Present Illness 85 y.o. male with a history of coronary artery calcification noted on chest CT in 2016, PAD s/p right CEA in 02/2009, hypertension, hyperlipidemia, BPH, GERD, thyroid nodule, and insomnia who presents to the ED with weakness, dizziness, and palpitations and EKG concerning for complete heart block. s/p pacemaker implantation 12/23/18. Per chart, pt had R humeral neck fracture on 11/26/18 after a fall, pt unable to recall orthopedic MD's recommendations.   Clinical Impression   PTA patient reports independent, but noted recent fall with R humeral neck fx on 10/20 and pt limited historian. Admitted for above and limited by problem list below, including impaired balance, BUE limited use (L UE pacemaker precautions, R UE humeral neck fx), impaired cognition and generalized weakness. Patient currently requires min assist for grooming and eating, max assist for UB ADLs and total assist for LB ADLs, sit to stand with mod assist.  Pt unable to recall pacemaker precautions (RN reports sling removed after 24 hours), and therapist donned sling to R UE as pt could not remember if he needed it but reports it "feels better" with the sling. He will benefit from further OT services while admitted and after dc at SNF level rehab in order to optimize return to PLOF with ADLs/mobility and decrease burden of care.     Follow Up Recommendations  SNF;Supervision/Assistance - 24 hour    Equipment Recommendations  3 in 1 bedside commode    Recommendations for Other Services       Precautions / Restrictions Precautions Precautions: ICD/Pacemaker Precaution Comments: recent R humeral neck fx on 11/26/18 (pt unable to recall recommendations, therapist donned sling)  Required Braces or Orthoses: Sling Restrictions Weight Bearing Restrictions: Yes RUE Weight  Bearing: Non weight bearing Other Position/Activity Restrictions: assume NWB RUE 2* recent humerus fx, need to clarify      Mobility Bed Mobility Overal bed mobility: Needs Assistance Bed Mobility: Supine to Sit;Sit to Supine     Supine to sit: Mod assist Sit to supine: Mod assist   General bed mobility comments: pt able to manage LEs but requires assist for trunk   Transfers Overall transfer level: Needs assistance   Transfers: Sit to/from Stand Sit to Stand: Mod assist         General transfer comment: pt requires mod assist to power up and steady with posterior lean noted     Balance Overall balance assessment: Needs assistance Sitting-balance support: No upper extremity supported;Feet supported Sitting balance-Leahy Scale: Fair Sitting balance - Comments: min guard to close supervision    Standing balance support: No upper extremity supported;During functional activity Standing balance-Leahy Scale: Poor Standing balance comment: posterior lean, relaint on external support                           ADL either performed or assessed with clinical judgement   ADL Overall ADL's : Needs assistance/impaired Eating/Feeding: Minimal assistance;Sitting   Grooming: Moderate assistance;Sitting   Upper Body Bathing: Maximal assistance;Sitting   Lower Body Bathing: Maximal assistance;Sit to/from stand   Upper Body Dressing : Maximal assistance;Sitting   Lower Body Dressing: Total assistance;Sit to/from stand   Toilet Transfer: Moderate assistance Toilet Transfer Details (indicate cue type and reason): simulated in room, side stepping to [facility]         Functional mobility during ADLs: Moderate assistance;Cueing for safety;Cueing  for sequencing General ADL Comments: pt limited by BUE limited functional use (L pacemaker, R fx), impaired balance, impaired cognitoin and generalized weakness      Vision Baseline Vision/History: Wears glasses Wears Glasses: At  all times Patient Visual Report: No change from baseline Vision Assessment?: No apparent visual deficits     Perception     Praxis      Pertinent Vitals/Pain Pain Assessment: No/denies pain     Hand Dominance Left   Extremity/Trunk Assessment Upper Extremity Assessment Upper Extremity Assessment: RUE deficits/detail;LUE deficits/detail RUE Deficits / Details: recent R humeral fx, no ROM at shoulder (still clarifying restrictions); [facility] elbow, wrist, hand  LUE Deficits / Details: pacemaker 12/23/18; WFL elbow, wrist, hand; no shoulder ROM due to restrictions  LUE: Unable to fully assess due to immobilization   Lower Extremity Assessment Lower Extremity Assessment: Defer to PT evaluation   Cervical / Trunk Assessment Cervical / Trunk Assessment: Kyphotic   Communication Communication Communication: HOH   Cognition Arousal/Alertness: Awake/alert Behavior During Therapy: WFL for tasks assessed/performed Overall Cognitive Status: Impaired/Different from baseline Area of Impairment: Memory;Following commands;Safety/judgement;Awareness;Problem solving                     Memory: Decreased short-term memory;Decreased recall of precautions Following Commands: Follows one step commands consistently;Follows one step commands with increased time;Follows multi-step commands inconsistently Safety/Judgement: Decreased awareness of safety;Decreased awareness of deficits Awareness: Emergent Problem Solving: Slow processing;Decreased initiation;Difficulty sequencing;Requires verbal cues;Requires tactile cues General Comments: pt pleasant and oriented, follows 1 step commands with increased time but poor recall of precautions   General Comments  pt unable to recall restrictions to R UE since fx, therapist donned sling and pt reports this "feels better"     Exercises     Shoulder Instructions      Home Living Family/patient expects to be discharged to:: Skilled nursing  facility                                        Prior Functioning/Environment Level of Independence: Independent with assistive device(s)        Comments: pt reports independent, unreliable historian        OT Problem List: Decreased strength;Decreased activity tolerance;Impaired balance (sitting and/or standing);Decreased coordination;Decreased cognition;Decreased safety awareness;Decreased knowledge of use of DME or AE;Decreased knowledge of precautions;Cardiopulmonary status limiting activity;Impaired UE functional use;Decreased range of motion      OT Treatment/Interventions: Self-care/ADL training;Therapeutic exercise;DME and/or AE instruction;Therapeutic activities;Cognitive remediation/compensation;Patient/family education;Balance training    OT Goals(Current goals can be found in the care plan section) Acute Rehab OT Goals Patient Stated Goal: to get better OT Goal Formulation: With patient Time For Goal Achievement: 01/08/19 Potential to Achieve Goals: Fair  OT Frequency: Min 2X/week   Barriers to D/C:            Co-evaluation              AM-PAC OT "6 Clicks" Daily Activity     Outcome Measure Help from another person eating meals?: A Little Help from another person taking care of personal grooming?: A Lot Help from another person toileting, which includes using toliet, bedpan, or urinal?: A Lot Help from another person bathing (including washing, rinsing, drying)?: A Lot Help from another person to put on and taking off regular upper body clothing?: A Lot Help from another person to put on and taking off regular  lower body clothing?: Total 6 Click Score: 12   End of Session Equipment Utilized During Treatment: Other (comment)(sling) Nurse Communication: Mobility status;Precautions  Activity Tolerance: Patient tolerated treatment well Patient left: in bed;with call bell/phone within reach;with bed alarm set  OT Visit Diagnosis: Other  abnormalities of gait and mobility (R26.89);Muscle weakness (generalized) (M62.81);Other symptoms and signs involving cognitive function                Time: [id number] OT Time Calculation (min): 25 min Charges:  OT General Charges $OT Visit: 1 Visit OT Evaluation $OT Eval Moderate Complexity: 1 Mod OT Treatments $Self Care/Home Management : 8-22 mins  Delight Stare, OT Acute Rehabilitation Services Pager [phone] Office [phone]    Delight Stare 12/25/2018, 9:27 AM

## 2018-12-25 NOTE — TOC Progression Note (Signed)
Transition of Care [facility]) - Progression Note    Patient Details  Name: William Mann MRN: [id number] Date of Birth: [birth year]  Transition of [facility] CM/SW Contact  Graves-Bigelow, Ocie Cornfield, RN Phone Number: 12/25/2018, 3:42 PM  Clinical Narrative:   CM just received notification that patient will have a bed available at [facility] 12-26-18. COVID test to be ordered. Patient should be able to transition after lunch tomorrow via PTAR. CM will continue to follow for additional transition of care needs.   Expected Discharge Plan: [place] Barriers to Discharge: No Barriers Identified  Expected Discharge Plan and Services Expected Discharge Plan: [place] In-house Referral: NA Discharge Planning Services: CM Consult Post Acute Care Choice: [place] Living arrangements for the past 2 months: Single Family Home Expected Discharge Date: 12/24/18                   Social Determinants of Health (SDOH) Interventions    Readmission Risk Interventions No flowsheet data found.

## 2018-12-25 NOTE — Plan of Care (Signed)
  Problem: Clinical Measurements: Goal: Respiratory complications will improve Outcome: Progressing   Problem: Nutrition: Goal: Adequate nutrition will be maintained Outcome: Progressing   Problem: Coping: Goal: Level of anxiety will decrease Outcome: Progressing   Problem: Elimination: Goal: Will not experience complications related to bowel motility Outcome: Progressing Goal: Will not experience complications related to urinary retention Outcome: Progressing   Problem: Pain Managment: Goal: General experience of comfort will improve Outcome: Progressing   Problem: Safety: Goal: Ability to remain free from injury will improve Outcome: Progressing

## 2018-12-25 NOTE — TOC Progression Note (Signed)
Transition of Care [facility]) - Progression Note    Patient Details  Name: William Mann MRN: [id number] Date of Birth: [birth year]  Transition of Care [facility]) CM/SW Contact  Graves-Bigelow, Ocie Cornfield, RN Phone Number: 12/25/2018, 3:08 PM  Clinical Narrative:  No bed available at [facility] and [facility]Clinicals resubmitted to [place], [facility] and [facility] to see if any has bed available. CM has spoken with wife regarding calling Personal Care Agencies to assist with 24 hour care if the patient has to go home tomorrow- with [facility]. CM will follow for additional transition of care needs.       Expected Discharge Plan: [place] Barriers to Discharge: No Barriers Identified  Expected Discharge Plan and Services Expected Discharge Plan: [place] In-house Referral: NA Discharge Planning Services: CM Consult Post Acute Care Choice: [place] Living arrangements for the past 2 months: Single Family Home Expected Discharge Date: 12/24/18                   Social Determinants of Health (SDOH) Interventions    Readmission Risk Interventions No flowsheet data found.

## 2018-12-25 NOTE — Progress Notes (Signed)
Inpatient Rehab Admissions:  Inpatient Rehab Consult received.  I met with pt at the bedside for rehabilitation assessment at the request of pt's family. Pt verbalized understanding of recommendation for short term rehab prior to returning home. Noted pt is currently assumed to be NWB RUE and has new pacemaker precautions for LUE. Given current BUE limitations, confusion, limited physical assistance at home, and poor activity tolerance with therapies, feel SNF is most appropriate placement at this time. Spoke with pt, his wife, and his son via phone conference to discuss recommendations. PM&R MD, Dr. Posey Pronto reviewed chart as well and is in agreement with [facility] and therapy recommendations for a less intensive therapy program at this time. Extensive discussion had with pt and his family regarding other options for rehab as pt is not a candidate for CIR at this time. They would like continued discussion with TOC team. CM Hassan Rowan notified.   AC will sign off.   Please call if questions.   William Mann, OTR/L  Rehab Admissions Coordinator  [phone] 12/25/2018 11:58 AM

## 2018-12-25 NOTE — Progress Notes (Addendum)
Electrophysiology Rounding Note  Patient Name: William Mann Date of Encounter: 12/25/2018  Primary Cardiologist: Sinclair Grooms, MD Electrophysiologist: Dr. Lovena Le.    Subjective   The patient is doing OK today.  No new complaints.   Discharge has been delayed by SNF placement.  Pt not candidate for CIR per assessment today.   Inpatient Medications    Scheduled Meds: . simvastatin  10 mg Oral Daily   Continuous Infusions:  PRN Meds: acetaminophen, alum & mag hydroxide-simeth, nitroGLYCERIN, ondansetron (ZOFRAN) IV, zolpidem   Vital Signs    Vitals:   12/25/18 0700 12/25/18 0915 12/25/18 1023 12/25/18 1423  BP: 117/61   104/68  Pulse: (!) 44 (!) 103  (!) 106  Resp:  15 16   Temp: (!) 97.5 F (36.4 C)   (!) 97.5 F (36.4 C)  TempSrc: Oral   Oral  SpO2: 94% 95%  96%  Weight:      Height:        Intake/Output Summary (Last 24 hours) at 12/25/2018 1425 Last data filed at 12/24/2018 2132 Gross per 24 hour  Intake 360 ml  Output -  Net 360 ml   Filed Weights   12/22/18 0325 12/23/18 0609 12/24/18 0736  Weight: 68.2 kg 67.2 kg 66.8 kg    Physical Exam    GEN- The patient is elderly and chronically ill appearing, alert and oriented. Head- normocephalic, atraumatic Eyes-  Sclera clear, conjunctiva pink Ears- hearing intact Oropharynx- clear Neck- supple Lungs- Clear to ausculation bilaterally, normal work of breathing Heart- Regular rate and rhythm, no murmurs, rubs or gallops GI- soft, NT, ND, + BS Extremities- no clubbing, cyanosis, or edema Skin- no rash or lesion. PPM site stable. Psych- euthymic mood, full affect Neuro- strength and sensation are intact  Labs    CBC No results for input(s): WBC, NEUTROABS, HGB, HCT, MCV, PLT in the last 72 hours. Basic Metabolic Panel No results for input(s): NA, K, CL, CO2, GLUCOSE, BUN, CREATININE, CALCIUM, MG, PHOS in the last 72 hours. Liver Function Tests No results for input(s): AST, ALT, ALKPHOS,  BILITOT, PROT, ALBUMIN in the last 72 hours. No results for input(s): LIPASE, AMYLASE in the last 72 hours. Cardiac Enzymes No results for input(s): CKTOTAL, CKMB, CKMBINDEX, TROPONINI in the last 72 hours.   Telemetry    NSR/V pacing 90-100s (personally reviewed)  Radiology    Dg Chest 2 View  Result Date: 12/24/2018 CLINICAL DATA:  Pacemaker EXAM: CHEST - 2 VIEW COMPARISON:  12/20/2018 FINDINGS: Severely rotated to the LEFT. New LEFT subclavian transvenous pacemaker with leads projecting over RIGHT atrium and RIGHT ventricle. Normal heart size, mediastinal contours, and pulmonary vascularity. Atherosclerotic calcification aorta. Bibasilar atelectasis. No infiltrate, pleural effusion, or pneumothorax. IMPRESSION: Bibasilar atelectasis. No pneumothorax following pacemaker placement. Aortic atherosclerosis. Electronically Signed   By: Lavonia Dana M.William.   On: 12/24/2018 08:13    Patient Profile     William Mann is a 85 y.o. male with a past medical history significant for PAD, s/p CEA, HTN, HLD, BPH, and GERD.  he was admitted for intermittent dizzy spells and fatigue and found to have Mobitz 1 as well as intermittent CHB. Underwent implantations of STJ Dual chamber PPM 12/23/2018.   Assessment & Plan    1. CHB s/p St Jude dual chamber PPM Stable device function, CXR, and tele post op.  Discharge has been delayed due to bed placement.   Pt pending bed placement at [facility] tomorrow. If no bed  available, per RN, CM states he will need to be cared for at home by family with 24 hr supervision.    For questions or updates, please contact [place] Please consult [email or web address] for contact info under Cardiology/STEMI.  Signed, Shirley Friar, PA-C  12/25/2018, 2:25 PM   Mikle Bosworth.William.

## 2018-12-25 NOTE — Progress Notes (Signed)
Physical Therapy Treatment Patient Details Name: William Mann MRN: [id number] DOB: 1929-03-31 Today's Date: 12/25/2018    History of Present Illness 85 y.o. male with a history of coronary artery calcification noted on chest CT in 2016, PAD s/p right CEA in 02/2009, hypertension, hyperlipidemia, BPH, GERD, thyroid nodule, and insomnia who presents to the ED with weakness, dizziness, and palpitations and EKG concerning for complete heart block. s/p pacemaker implantation 12/23/18. Per chart, pt had R humeral neck fracture on 11/26/18 after a fall, pt unable to recall orthopedic MD's recommendations.    PT Comments    Pt confused today - more than at admission per nursing tech.  HR 110-130 during PT visit.  Pt incontinent of urine - says he cant tell when he has to go - he was lying in wet bed - second time today per nursing tech.  Pt mod assist for all mobilty with +2 when leaving the bed. Pt walked 80 feet with poor technque.  Pt needs more than [place] services at DC. He will need skilled PT services at [facility] or SNF level care as he remains high fall risk presently.  MD - please get update from [facility] on right humeral fracture from 11-26-18 prior to DC.   Follow Up Recommendations  SNF;Supervision/Assistance - 24 hour     Equipment Recommendations  None recommended by PT    Recommendations for Other Services       Precautions / Restrictions Precautions Precautions: ICD/Pacemaker Precaution Comments: recent R humeral neck fx on 11/26/18 (pt unable to recall recommendations, therapist donned sling) , pacemaker precautions L arm Required Braces or Orthoses: Sling Restrictions Weight Bearing Restrictions: Yes RUE Weight Bearing: Non weight bearing Other Position/Activity Restrictions: assume NWB RUE 2* recent humerus fx, need to clarify    Mobility  Bed Mobility Overal bed mobility: Needs Assistance Bed Mobility: Supine to Sit;Sit to Supine     Supine to sit: Mod assist Sit to supine:  Mod assist   General bed mobility comments: pt able to manage LEs but requires assist for trunk - as limited in use of both UEs  Transfers Overall transfer level: Needs assistance   Transfers: Sit to/from Stand Sit to Stand: Mod assist;From elevated surface         General transfer comment: pt requires mod assist to power up and steady with posterior lean noted  - pt cued for minimal UE use  Ambulation/Gait Ambulation/Gait assistance: Mod assist Gait Distance (Feet): 80 Feet Assistive device: 1 person hand held assist Gait Pattern/deviations: Leaning posteriorly;Shuffle;Decreased stride length;Narrow base of support     General Gait Details: Pt walked in room - his weight is posteriro to his feet. pt needed mod to min assist to walk in straight line and mod assist for turning and backing up.   Stairs             Wheelchair Mobility    Modified Rankin (Stroke Patients Only)       Balance Overall balance assessment: Needs assistance Sitting-balance support: No upper extremity supported;Feet supported Sitting balance-Leahy Scale: Fair Sitting balance - Comments: min guard to close supervision  Postural control: Posterior lean Standing balance support: No upper extremity supported;During functional activity Standing balance-Leahy Scale: Poor Standing balance comment: posterior lean, relaint on external support                            Cognition Arousal/Alertness: Awake/alert Behavior During Therapy: Agitated Overall Cognitive Status:  Impaired/Different from baseline Area of Impairment: Memory;Following commands;Safety/judgement;Awareness;Problem solving                     Memory: Decreased short-term memory;Decreased recall of precautions Following Commands: Follows one step commands consistently;Follows one step commands with increased time;Follows multi-step commands inconsistently Safety/Judgement: Decreased awareness of  safety;Decreased awareness of deficits Awareness: Emergent Problem Solving: Slow processing;Decreased initiation;Difficulty sequencing;Requires verbal cues;Requires tactile cues General Comments: Pt confused - hard time following conversation.  Nursing tech said different mentally than he was on admit      Exercises      General Comments General comments (skin integrity, edema, etc.): pt has right arm in sling , asked about his precautions of left arm (OT has just reviewed them with pt).  he knew to limit use in both UEs      Pertinent Vitals/Pain Pain Assessment: No/denies pain    Home Living Family/patient expects to be discharged to:: Skilled nursing facility                    Prior Function Level of Independence: Independent with assistive device(s)      Comments: pt reports independent, unreliable historian   PT Goals (current goals can now be found in the care plan section) Acute Rehab PT Goals Patient Stated Goal: to get better Progress towards PT goals: Progressing toward goals    Frequency    Min 3X/week      PT Plan Current plan remains appropriate    Co-evaluation              AM-PAC PT "6 Clicks" Mobility   Outcome Measure  Help needed turning from your back to your side while in a flat bed without using bedrails?: A Lot Help needed moving from lying on your back to sitting on the side of a flat bed without using bedrails?: A Lot Help needed moving to and from a bed to a chair (including a wheelchair)?: A Lot Help needed standing up from a chair using your arms (e.g., wheelchair or bedside chair)?: A Lot Help needed to walk in hospital room?: A Lot Help needed climbing 3-5 steps with a railing? : Total 6 Click Score: 11    End of Session Equipment Utilized During Treatment: Gait belt Activity Tolerance: Patient limited by fatigue Patient left: in chair;with call bell/phone within reach;with chair alarm set;with nursing/sitter in  room Nurse Communication: Mobility status;Precautions;Weight bearing status PT Visit Diagnosis: Difficulty in walking, not elsewhere classified (R26.2);History of falling (Z91.81)     Time: 0930-1000 PT Time Calculation (min) (ACUTE ONLY): 30 min  Charges:  $Gait Training: 8-22 mins $Therapeutic Activity: 8-22 mins                    12/25/2018   Rande Lawman, PT    Loyal Buba 12/25/2018, 10:11 AM

## 2018-12-26 NOTE — Care Management Important Message (Signed)
Important Message  Patient Details  Name: Deren Cazenave MRN: [id number] Date of Birth: 1929/02/20   Medicare Important Message Given:  Yes     Shelda Altes 12/26/2018, 2:25 PM

## 2018-12-26 NOTE — Progress Notes (Addendum)
Electrophysiology Rounding Note  Patient Name: William Mann Date of Encounter: 12/26/2018  Primary Cardiologist: Sinclair Grooms, MD Electrophysiologist: Dr. Lovena Le   Subjective   The patient is doing [facility] today. Had initially planned for SNF placement. However, Medicare "red flag" has had case manager on phone all day.  Initially refused by SNF, planned [facility],  NOW accepted by SNF.    Plan to discharge early tomorrow am by PTAR per case management.   Inpatient Medications    Scheduled Meds: . simvastatin  10 mg Oral Daily   Continuous Infusions:  PRN Meds: acetaminophen, alum & mag hydroxide-simeth, nitroGLYCERIN, ondansetron (ZOFRAN) IV, zolpidem   Vital Signs    Vitals:   12/26/18 0530 12/26/18 0544 12/26/18 0545 12/26/18 0836  BP: 98/73     Pulse: (!) 102 (!) 58 61   Resp: 17 (!) 22 16   Temp: 98.3 F (36.8 C)     TempSrc: Axillary     SpO2: 93% 95% 93% 96%  Weight: 65.4 kg     Height:        Intake/Output Summary (Last 24 hours) at 12/26/2018 1509 Last data filed at 12/26/2018 0800 Gross per 24 hour  Intake 480 ml  Output 450 ml  Net 30 ml   Filed Weights   12/23/18 0609 12/24/18 0736 12/26/18 0530  Weight: 67.2 kg 66.8 kg 65.4 kg    Physical Exam    GEN- The patient is well appearing, alert and oriented x 3 today.   Head- normocephalic, atraumatic Eyes-  Sclera clear, conjunctiva pink Ears- hearing intact Oropharynx- clear Neck- supple Lungs- Clear to ausculation bilaterally, normal work of breathing Heart- Regular rate and rhythm, no murmurs, rubs or gallops GI- soft, NT, ND, + BS Extremities- no clubbing, cyanosis, or edema Skin- no rash or lesion Psych- euthymic mood, full affect Neuro- strength and sensation are intact  Labs    CBC No results for input(s): WBC, NEUTROABS, HGB, HCT, MCV, PLT in the last 72 hours. Basic Metabolic Panel No results for input(s): NA, K, CL, CO2, GLUCOSE, BUN, CREATININE, CALCIUM, MG, PHOS in the last 72  hours. Liver Function Tests No results for input(s): AST, ALT, ALKPHOS, BILITOT, PROT, ALBUMIN in the last 72 hours. No results for input(s): LIPASE, AMYLASE in the last 72 hours. Cardiac Enzymes No results for input(s): CKTOTAL, CKMB, CKMBINDEX, TROPONINI in the last 72 hours.   Telemetry    NSR/V pacing 90-100s (personally reviewed)  Radiology    No results found.  Patient Profile     William Bobak Pynes is a 85 y.o. male with a past medical history significant for PAD, s/p CEA, HTN, HLD, BPH, and GERD.  he was admitted for intermittent dizzy spells and fatigue and found to have Mobitz 1 as well as intermittent CHB. Underwent implantations of STJ Dual chamber PPM 12/23/2018.   Assessment & Plan    1. CHB s/p ST Jude Dual chamber PPM Stable device function, CXR, and tele post op.  Usual follow up set up, along with wound care and lifting restrictions reviewed and in chart.   Pt will now be going to [facility] via PTAR "early" tomorrow am.   For questions or updates, please contact [place] Please consult [email or web address] for contact info under Cardiology/STEMI.  Signed, Shirley Friar, PA-C  12/26/2018, 3:09 PM   EP Attending  Patient seen and examined. Agree with the findings as noted above. The patient is ready for DC, pending SNF placement. Followup  as above.  Mikle Bosworth.William.
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
## 2018-12-26 NOTE — Consult Note (Signed)
[facility] CM Inpatient Consult   12/26/2018  William Mann Nov 09, 1929 [id number]  Patient screened for potential [facility] Care Management services are needed.  Review of patient's meets the criteria for post hospital programs in the Medicare NextGen ACO.  EMR reveals from PT evaluation patient's need for SNF and/or 24 hour assistance.  Primary Care Provider is  [name], MD which is an in network provider, [facility] physician which provides the transition of care calls/follow up.  Call was received from inpatient [facility] team regarding patient's eligibility for [facility] care needs.  Continue to follow progress and disposition to assess for post hospital care management needs.   Plan:  Follow up for programs with inpatient TOC RNCM.   For questions contact:   Charlesetta Shanks, RN BSN CCM [facility]  [phone] business mobile phone Toll free office [phone]  Fax number: [phone] [email or web address]

## 2018-12-27 DIAGNOSIS — M40209 Unspecified kyphosis, site unspecified: Secondary | ICD-10-CM | POA: Diagnosis not present

## 2018-12-27 DIAGNOSIS — Z23 Encounter for immunization: Secondary | ICD-10-CM | POA: Diagnosis not present

## 2018-12-27 DIAGNOSIS — S42201D Unspecified fracture of upper end of right humerus, subsequent encounter for fracture with routine healing: Secondary | ICD-10-CM | POA: Diagnosis not present

## 2018-12-27 DIAGNOSIS — Z48812 Encounter for surgical aftercare following surgery on the circulatory system: Secondary | ICD-10-CM | POA: Diagnosis not present

## 2018-12-27 DIAGNOSIS — R41841 Cognitive communication deficit: Secondary | ICD-10-CM | POA: Diagnosis not present

## 2018-12-27 DIAGNOSIS — I2510 Atherosclerotic heart disease of native coronary artery without angina pectoris: Secondary | ICD-10-CM | POA: Diagnosis not present

## 2018-12-27 DIAGNOSIS — N400 Enlarged prostate without lower urinary tract symptoms: Secondary | ICD-10-CM | POA: Diagnosis not present

## 2018-12-27 DIAGNOSIS — Z950 Presence of cardiac pacemaker: Secondary | ICD-10-CM | POA: Diagnosis not present

## 2018-12-27 DIAGNOSIS — W19XXXD Unspecified fall, subsequent encounter: Secondary | ICD-10-CM | POA: Diagnosis not present

## 2018-12-27 DIAGNOSIS — G4700 Insomnia, unspecified: Secondary | ICD-10-CM | POA: Diagnosis not present

## 2018-12-27 DIAGNOSIS — R488 Other symbolic dysfunctions: Secondary | ICD-10-CM | POA: Diagnosis not present

## 2018-12-27 NOTE — Care Management (Signed)
12-27-18 PTAR notified for pick up to transport to [facility]. No further needs from CM at this time. Bethena Roys, RN, BSN Case Manager [phone]

## 2018-12-27 NOTE — TOC Transition Note (Addendum)
Transition of Care [facility]) - CM/SW Discharge Note   Patient Details  Name: William Mann MRN: [id number] Date of Birth: Jul 17, 1929  Transition of Care [facility]) CM/SW Contact:  Bethena Roys, RN Phone Number: 12/27/2018, 7:49 AM   Clinical Narrative:   Pt will transition to [place] today at 10:00 am via PTAR. Wife will meet at the facility at 10:30 am today. D/c summary faxed to [facility] Admissions Coordinator at [phone]. CM will call facility to see what number to call report to.No further needs from CM at this time.   Pt will transition to room 24- RN to call report to [phone]. PTAR for transport.   Final next level of care: Skilled Nursing Facility Barriers to Discharge: No Barriers Identified   Patient Goals and CMS Choice Patient states their goals for this hospitalization and ongoing recovery are:: "to be able to return home" CMS Medicare.gov Compare Post Acute Care list provided to:: Patient Choice offered to / list presented to : Patient  Discharge Plan and Services In-house Referral: NA Discharge Planning Services: CM Consult Post Acute Care Choice: [place]               Social Determinants of Health (SDOH) Interventions     Readmission Risk Interventions No flowsheet data found.

## 2018-12-31 ENCOUNTER — Telehealth: Payer: Self-pay

## 2018-12-31 ENCOUNTER — Encounter (HOSPITAL_COMMUNITY): Payer: Self-pay | Admitting: Internal Medicine

## 2018-12-31 NOTE — Telephone Encounter (Signed)
**Note De-Identified  Obfuscation** I started a Eliquis PA through covermymeds. Key: [facility]

## 2018-12-31 NOTE — Telephone Encounter (Signed)
Message received from Covermymeds:  Arne [place] Key: [facility] Outcome  Approved today  PA Case: [id number]  Status: Approved Coverage Starts on: 02/06/2018 12:00:00 AM, Coverage Ends on: 02/06/2020 12:00:00 AM.   I have notified [place] of this approval.

## 2019-01-06 ENCOUNTER — Telehealth: Payer: Self-pay | Admitting: Internal Medicine

## 2019-01-06 ENCOUNTER — Telehealth: Payer: Self-pay

## 2019-01-06 NOTE — Telephone Encounter (Signed)
Patient wife is calling in regards to wanting to attend appointment with patient on 01/09/19. Please Advise.

## 2019-01-06 NOTE — Telephone Encounter (Signed)
Pt wife states the pt is in a wheelchair. I told her if he is in a wheelchair she can come up with the pt. I told her she and the pt will need to wear a mask. The pt wife verbalized understanding.

## 2019-01-09 ENCOUNTER — Ambulatory Visit: Payer: Medicare Other | Admitting: *Deleted

## 2019-01-09 ENCOUNTER — Other Ambulatory Visit: Payer: Self-pay

## 2019-01-09 DIAGNOSIS — I441 Atrioventricular block, second degree: Secondary | ICD-10-CM

## 2019-01-09 DIAGNOSIS — Z950 Presence of cardiac pacemaker: Secondary | ICD-10-CM

## 2019-01-09 LAB — CUP PACEART INCLINIC DEVICE CHECK
Battery Remaining Longevity: 79 mo
Battery Voltage: 2.99 V
Brady Statistic RA Percent Paced: 1.1 %
Brady Statistic RV Percent Paced: 82 %
Date Time Interrogation Session: 20201203170326
Implantable Lead Implant Date: 20201116
Implantable Lead Implant Date: 20201116
Implantable Lead Location: 753859
Implantable Lead Location: 753860
Implantable Pulse Generator Implant Date: 20201116
Lead Channel Impedance Value: 437.5 Ohm
Lead Channel Impedance Value: 700 Ohm
Lead Channel Pacing Threshold Amplitude: 0.5 V
Lead Channel Pacing Threshold Amplitude: 0.75 V
Lead Channel Pacing Threshold Pulse Width: 0.5 ms
Lead Channel Pacing Threshold Pulse Width: 0.5 ms
Lead Channel Sensing Intrinsic Amplitude: 1.5 mV
Lead Channel Sensing Intrinsic Amplitude: 12 mV
Lead Channel Setting Pacing Amplitude: 3.5 V
Lead Channel Setting Pacing Amplitude: 3.5 V
Lead Channel Setting Pacing Pulse Width: 0.5 ms
Lead Channel Setting Sensing Sensitivity: 2 mV
Pulse Gen Model: 2272
Pulse Gen Serial Number: 9172104

## 2019-01-09 NOTE — Progress Notes (Signed)
Wound check appointment. Steri-strips removed. Wound without redness or edema. Incision edges approximated, wound well healed. Normal device function. Thresholds, sensing, and impedances consistent with implant measurements. Device programmed at 3.5V for extra safety margin until 3 month visit. Histogram distribution right shifted, patient asymptomatic, no changes per GT. No mode switches or high ventricular rates noted. 66 "PMT" episodes--EGMs show runs of likely AT, no V-A conduction today. Patient and wife educated about wound care, arm mobility, lifting restrictions, and Merlin monitor. ROV with Dr. Lovena Le on 03/24/19.

## 2019-01-13 DIAGNOSIS — M25511 Pain in right shoulder: Secondary | ICD-10-CM | POA: Diagnosis not present

## 2019-01-13 DIAGNOSIS — S42201D Unspecified fracture of upper end of right humerus, subsequent encounter for fracture with routine healing: Secondary | ICD-10-CM | POA: Diagnosis not present

## 2019-01-20 DIAGNOSIS — S42211D Unspecified displaced fracture of surgical neck of right humerus, subsequent encounter for fracture with routine healing: Secondary | ICD-10-CM | POA: Diagnosis not present

## 2019-01-20 DIAGNOSIS — I6523 Occlusion and stenosis of bilateral carotid arteries: Secondary | ICD-10-CM | POA: Diagnosis not present

## 2019-01-20 DIAGNOSIS — Z86718 Personal history of other venous thrombosis and embolism: Secondary | ICD-10-CM | POA: Diagnosis not present

## 2019-01-20 DIAGNOSIS — K760 Fatty (change of) liver, not elsewhere classified: Secondary | ICD-10-CM | POA: Diagnosis not present

## 2019-01-20 DIAGNOSIS — I739 Peripheral vascular disease, unspecified: Secondary | ICD-10-CM | POA: Diagnosis not present

## 2019-01-20 DIAGNOSIS — I10 Essential (primary) hypertension: Secondary | ICD-10-CM | POA: Diagnosis not present

## 2019-01-20 DIAGNOSIS — K5900 Constipation, unspecified: Secondary | ICD-10-CM | POA: Diagnosis not present

## 2019-01-20 DIAGNOSIS — I2510 Atherosclerotic heart disease of native coronary artery without angina pectoris: Secondary | ICD-10-CM | POA: Diagnosis not present

## 2019-01-20 DIAGNOSIS — M40209 Unspecified kyphosis, site unspecified: Secondary | ICD-10-CM | POA: Diagnosis not present

## 2019-01-20 DIAGNOSIS — K219 Gastro-esophageal reflux disease without esophagitis: Secondary | ICD-10-CM | POA: Diagnosis not present

## 2019-01-20 DIAGNOSIS — E041 Nontoxic single thyroid nodule: Secondary | ICD-10-CM | POA: Diagnosis not present

## 2019-01-20 DIAGNOSIS — J439 Emphysema, unspecified: Secondary | ICD-10-CM | POA: Diagnosis not present

## 2019-01-20 DIAGNOSIS — I700 Atherosclerosis of aorta: Secondary | ICD-10-CM | POA: Diagnosis not present

## 2019-01-20 DIAGNOSIS — E785 Hyperlipidemia, unspecified: Secondary | ICD-10-CM | POA: Diagnosis not present

## 2019-01-20 DIAGNOSIS — Z7982 Long term (current) use of aspirin: Secondary | ICD-10-CM | POA: Diagnosis not present

## 2019-01-20 DIAGNOSIS — Z9181 History of falling: Secondary | ICD-10-CM | POA: Diagnosis not present

## 2019-01-20 DIAGNOSIS — C4491 Basal cell carcinoma of skin, unspecified: Secondary | ICD-10-CM | POA: Diagnosis not present

## 2019-01-20 DIAGNOSIS — Z7901 Long term (current) use of anticoagulants: Secondary | ICD-10-CM | POA: Diagnosis not present

## 2019-01-20 DIAGNOSIS — I442 Atrioventricular block, complete: Secondary | ICD-10-CM | POA: Diagnosis not present

## 2019-01-20 DIAGNOSIS — N400 Enlarged prostate without lower urinary tract symptoms: Secondary | ICD-10-CM | POA: Diagnosis not present

## 2019-01-20 DIAGNOSIS — Z48812 Encounter for surgical aftercare following surgery on the circulatory system: Secondary | ICD-10-CM | POA: Diagnosis not present

## 2019-01-20 DIAGNOSIS — Z950 Presence of cardiac pacemaker: Secondary | ICD-10-CM | POA: Diagnosis not present

## 2019-01-20 DIAGNOSIS — W19XXXD Unspecified fall, subsequent encounter: Secondary | ICD-10-CM | POA: Diagnosis not present

## 2019-01-22 DIAGNOSIS — I2510 Atherosclerotic heart disease of native coronary artery without angina pectoris: Secondary | ICD-10-CM | POA: Diagnosis not present

## 2019-01-22 DIAGNOSIS — I442 Atrioventricular block, complete: Secondary | ICD-10-CM | POA: Diagnosis not present

## 2019-01-22 DIAGNOSIS — Z48812 Encounter for surgical aftercare following surgery on the circulatory system: Secondary | ICD-10-CM | POA: Diagnosis not present

## 2019-01-22 DIAGNOSIS — S42211D Unspecified displaced fracture of surgical neck of right humerus, subsequent encounter for fracture with routine healing: Secondary | ICD-10-CM | POA: Diagnosis not present

## 2019-01-22 DIAGNOSIS — I739 Peripheral vascular disease, unspecified: Secondary | ICD-10-CM | POA: Diagnosis not present

## 2019-01-22 DIAGNOSIS — I10 Essential (primary) hypertension: Secondary | ICD-10-CM | POA: Diagnosis not present

## 2019-01-27 DIAGNOSIS — Z48812 Encounter for surgical aftercare following surgery on the circulatory system: Secondary | ICD-10-CM | POA: Diagnosis not present

## 2019-01-27 DIAGNOSIS — I2510 Atherosclerotic heart disease of native coronary artery without angina pectoris: Secondary | ICD-10-CM | POA: Diagnosis not present

## 2019-01-27 DIAGNOSIS — S42211D Unspecified displaced fracture of surgical neck of right humerus, subsequent encounter for fracture with routine healing: Secondary | ICD-10-CM | POA: Diagnosis not present

## 2019-01-27 DIAGNOSIS — I739 Peripheral vascular disease, unspecified: Secondary | ICD-10-CM | POA: Diagnosis not present

## 2019-01-27 DIAGNOSIS — I10 Essential (primary) hypertension: Secondary | ICD-10-CM | POA: Diagnosis not present

## 2019-01-27 DIAGNOSIS — I442 Atrioventricular block, complete: Secondary | ICD-10-CM | POA: Diagnosis not present

## 2019-01-28 DIAGNOSIS — I442 Atrioventricular block, complete: Secondary | ICD-10-CM | POA: Diagnosis not present

## 2019-01-28 DIAGNOSIS — I10 Essential (primary) hypertension: Secondary | ICD-10-CM | POA: Diagnosis not present

## 2019-01-28 DIAGNOSIS — I2510 Atherosclerotic heart disease of native coronary artery without angina pectoris: Secondary | ICD-10-CM | POA: Diagnosis not present

## 2019-01-28 DIAGNOSIS — Z48812 Encounter for surgical aftercare following surgery on the circulatory system: Secondary | ICD-10-CM | POA: Diagnosis not present

## 2019-01-28 DIAGNOSIS — S42211D Unspecified displaced fracture of surgical neck of right humerus, subsequent encounter for fracture with routine healing: Secondary | ICD-10-CM | POA: Diagnosis not present

## 2019-01-28 DIAGNOSIS — I739 Peripheral vascular disease, unspecified: Secondary | ICD-10-CM | POA: Diagnosis not present

## 2019-02-03 DIAGNOSIS — I442 Atrioventricular block, complete: Secondary | ICD-10-CM | POA: Diagnosis not present

## 2019-02-03 DIAGNOSIS — I10 Essential (primary) hypertension: Secondary | ICD-10-CM | POA: Diagnosis not present

## 2019-02-03 DIAGNOSIS — S42211D Unspecified displaced fracture of surgical neck of right humerus, subsequent encounter for fracture with routine healing: Secondary | ICD-10-CM | POA: Diagnosis not present

## 2019-02-03 DIAGNOSIS — Z48812 Encounter for surgical aftercare following surgery on the circulatory system: Secondary | ICD-10-CM | POA: Diagnosis not present

## 2019-02-03 DIAGNOSIS — I739 Peripheral vascular disease, unspecified: Secondary | ICD-10-CM | POA: Diagnosis not present

## 2019-02-03 DIAGNOSIS — I2510 Atherosclerotic heart disease of native coronary artery without angina pectoris: Secondary | ICD-10-CM | POA: Diagnosis not present

## 2019-02-04 DIAGNOSIS — I10 Essential (primary) hypertension: Secondary | ICD-10-CM | POA: Diagnosis not present

## 2019-02-04 DIAGNOSIS — I739 Peripheral vascular disease, unspecified: Secondary | ICD-10-CM | POA: Diagnosis not present

## 2019-02-04 DIAGNOSIS — S42211D Unspecified displaced fracture of surgical neck of right humerus, subsequent encounter for fracture with routine healing: Secondary | ICD-10-CM | POA: Diagnosis not present

## 2019-02-04 DIAGNOSIS — I442 Atrioventricular block, complete: Secondary | ICD-10-CM | POA: Diagnosis not present

## 2019-02-04 DIAGNOSIS — Z48812 Encounter for surgical aftercare following surgery on the circulatory system: Secondary | ICD-10-CM | POA: Diagnosis not present

## 2019-02-04 DIAGNOSIS — I2510 Atherosclerotic heart disease of native coronary artery without angina pectoris: Secondary | ICD-10-CM | POA: Diagnosis not present

## 2019-02-07 DIAGNOSIS — I10 Essential (primary) hypertension: Secondary | ICD-10-CM | POA: Diagnosis not present

## 2019-02-07 DIAGNOSIS — I2510 Atherosclerotic heart disease of native coronary artery without angina pectoris: Secondary | ICD-10-CM | POA: Diagnosis not present

## 2019-02-07 DIAGNOSIS — I739 Peripheral vascular disease, unspecified: Secondary | ICD-10-CM | POA: Diagnosis not present

## 2019-02-07 DIAGNOSIS — S42211D Unspecified displaced fracture of surgical neck of right humerus, subsequent encounter for fracture with routine healing: Secondary | ICD-10-CM | POA: Diagnosis not present

## 2019-02-07 DIAGNOSIS — Z48812 Encounter for surgical aftercare following surgery on the circulatory system: Secondary | ICD-10-CM | POA: Diagnosis not present

## 2019-02-07 DIAGNOSIS — I442 Atrioventricular block, complete: Secondary | ICD-10-CM | POA: Diagnosis not present

## 2019-02-10 DIAGNOSIS — S42211D Unspecified displaced fracture of surgical neck of right humerus, subsequent encounter for fracture with routine healing: Secondary | ICD-10-CM | POA: Diagnosis not present

## 2019-02-10 DIAGNOSIS — I442 Atrioventricular block, complete: Secondary | ICD-10-CM | POA: Diagnosis not present

## 2019-02-10 DIAGNOSIS — I2510 Atherosclerotic heart disease of native coronary artery without angina pectoris: Secondary | ICD-10-CM | POA: Diagnosis not present

## 2019-02-10 DIAGNOSIS — Z48812 Encounter for surgical aftercare following surgery on the circulatory system: Secondary | ICD-10-CM | POA: Diagnosis not present

## 2019-02-10 DIAGNOSIS — I739 Peripheral vascular disease, unspecified: Secondary | ICD-10-CM | POA: Diagnosis not present

## 2019-02-10 DIAGNOSIS — I10 Essential (primary) hypertension: Secondary | ICD-10-CM | POA: Diagnosis not present

## 2019-02-11 DIAGNOSIS — Z48812 Encounter for surgical aftercare following surgery on the circulatory system: Secondary | ICD-10-CM | POA: Diagnosis not present

## 2019-02-11 DIAGNOSIS — I739 Peripheral vascular disease, unspecified: Secondary | ICD-10-CM | POA: Diagnosis not present

## 2019-02-11 DIAGNOSIS — I10 Essential (primary) hypertension: Secondary | ICD-10-CM | POA: Diagnosis not present

## 2019-02-11 DIAGNOSIS — I442 Atrioventricular block, complete: Secondary | ICD-10-CM | POA: Diagnosis not present

## 2019-02-11 DIAGNOSIS — S42211D Unspecified displaced fracture of surgical neck of right humerus, subsequent encounter for fracture with routine healing: Secondary | ICD-10-CM | POA: Diagnosis not present

## 2019-02-11 DIAGNOSIS — I2510 Atherosclerotic heart disease of native coronary artery without angina pectoris: Secondary | ICD-10-CM | POA: Diagnosis not present

## 2019-02-13 DIAGNOSIS — I739 Peripheral vascular disease, unspecified: Secondary | ICD-10-CM | POA: Diagnosis not present

## 2019-02-13 DIAGNOSIS — Z48812 Encounter for surgical aftercare following surgery on the circulatory system: Secondary | ICD-10-CM | POA: Diagnosis not present

## 2019-02-13 DIAGNOSIS — I442 Atrioventricular block, complete: Secondary | ICD-10-CM | POA: Diagnosis not present

## 2019-02-13 DIAGNOSIS — I2510 Atherosclerotic heart disease of native coronary artery without angina pectoris: Secondary | ICD-10-CM | POA: Diagnosis not present

## 2019-02-13 DIAGNOSIS — I10 Essential (primary) hypertension: Secondary | ICD-10-CM | POA: Diagnosis not present

## 2019-02-13 DIAGNOSIS — S42211D Unspecified displaced fracture of surgical neck of right humerus, subsequent encounter for fracture with routine healing: Secondary | ICD-10-CM | POA: Diagnosis not present

## 2019-02-17 DIAGNOSIS — I2510 Atherosclerotic heart disease of native coronary artery without angina pectoris: Secondary | ICD-10-CM | POA: Diagnosis not present

## 2019-02-17 DIAGNOSIS — S42211D Unspecified displaced fracture of surgical neck of right humerus, subsequent encounter for fracture with routine healing: Secondary | ICD-10-CM | POA: Diagnosis not present

## 2019-02-17 DIAGNOSIS — I442 Atrioventricular block, complete: Secondary | ICD-10-CM | POA: Diagnosis not present

## 2019-02-17 DIAGNOSIS — I739 Peripheral vascular disease, unspecified: Secondary | ICD-10-CM | POA: Diagnosis not present

## 2019-02-17 DIAGNOSIS — Z48812 Encounter for surgical aftercare following surgery on the circulatory system: Secondary | ICD-10-CM | POA: Diagnosis not present

## 2019-02-17 DIAGNOSIS — I10 Essential (primary) hypertension: Secondary | ICD-10-CM | POA: Diagnosis not present

## 2019-02-18 DIAGNOSIS — S42211D Unspecified displaced fracture of surgical neck of right humerus, subsequent encounter for fracture with routine healing: Secondary | ICD-10-CM | POA: Diagnosis not present

## 2019-02-18 DIAGNOSIS — I10 Essential (primary) hypertension: Secondary | ICD-10-CM | POA: Diagnosis not present

## 2019-02-18 DIAGNOSIS — I739 Peripheral vascular disease, unspecified: Secondary | ICD-10-CM | POA: Diagnosis not present

## 2019-02-18 DIAGNOSIS — I2510 Atherosclerotic heart disease of native coronary artery without angina pectoris: Secondary | ICD-10-CM | POA: Diagnosis not present

## 2019-02-18 DIAGNOSIS — I442 Atrioventricular block, complete: Secondary | ICD-10-CM | POA: Diagnosis not present

## 2019-02-18 DIAGNOSIS — Z48812 Encounter for surgical aftercare following surgery on the circulatory system: Secondary | ICD-10-CM | POA: Diagnosis not present

## 2019-02-19 DIAGNOSIS — C4491 Basal cell carcinoma of skin, unspecified: Secondary | ICD-10-CM | POA: Diagnosis not present

## 2019-02-19 DIAGNOSIS — N400 Enlarged prostate without lower urinary tract symptoms: Secondary | ICD-10-CM | POA: Diagnosis not present

## 2019-02-19 DIAGNOSIS — I700 Atherosclerosis of aorta: Secondary | ICD-10-CM | POA: Diagnosis not present

## 2019-02-19 DIAGNOSIS — W19XXXD Unspecified fall, subsequent encounter: Secondary | ICD-10-CM | POA: Diagnosis not present

## 2019-02-19 DIAGNOSIS — I739 Peripheral vascular disease, unspecified: Secondary | ICD-10-CM | POA: Diagnosis not present

## 2019-02-19 DIAGNOSIS — J439 Emphysema, unspecified: Secondary | ICD-10-CM | POA: Diagnosis not present

## 2019-02-19 DIAGNOSIS — Z950 Presence of cardiac pacemaker: Secondary | ICD-10-CM | POA: Diagnosis not present

## 2019-02-19 DIAGNOSIS — M40209 Unspecified kyphosis, site unspecified: Secondary | ICD-10-CM | POA: Diagnosis not present

## 2019-02-19 DIAGNOSIS — I2510 Atherosclerotic heart disease of native coronary artery without angina pectoris: Secondary | ICD-10-CM | POA: Diagnosis not present

## 2019-02-19 DIAGNOSIS — K5900 Constipation, unspecified: Secondary | ICD-10-CM | POA: Diagnosis not present

## 2019-02-19 DIAGNOSIS — Z7982 Long term (current) use of aspirin: Secondary | ICD-10-CM | POA: Diagnosis not present

## 2019-02-19 DIAGNOSIS — E785 Hyperlipidemia, unspecified: Secondary | ICD-10-CM | POA: Diagnosis not present

## 2019-02-19 DIAGNOSIS — E041 Nontoxic single thyroid nodule: Secondary | ICD-10-CM | POA: Diagnosis not present

## 2019-02-19 DIAGNOSIS — S42211D Unspecified displaced fracture of surgical neck of right humerus, subsequent encounter for fracture with routine healing: Secondary | ICD-10-CM | POA: Diagnosis not present

## 2019-02-19 DIAGNOSIS — I442 Atrioventricular block, complete: Secondary | ICD-10-CM | POA: Diagnosis not present

## 2019-02-19 DIAGNOSIS — Z86718 Personal history of other venous thrombosis and embolism: Secondary | ICD-10-CM | POA: Diagnosis not present

## 2019-02-19 DIAGNOSIS — Z48812 Encounter for surgical aftercare following surgery on the circulatory system: Secondary | ICD-10-CM | POA: Diagnosis not present

## 2019-02-19 DIAGNOSIS — Z7901 Long term (current) use of anticoagulants: Secondary | ICD-10-CM | POA: Diagnosis not present

## 2019-02-19 DIAGNOSIS — I10 Essential (primary) hypertension: Secondary | ICD-10-CM | POA: Diagnosis not present

## 2019-02-19 DIAGNOSIS — K760 Fatty (change of) liver, not elsewhere classified: Secondary | ICD-10-CM | POA: Diagnosis not present

## 2019-02-19 DIAGNOSIS — I6523 Occlusion and stenosis of bilateral carotid arteries: Secondary | ICD-10-CM | POA: Diagnosis not present

## 2019-02-19 DIAGNOSIS — K219 Gastro-esophageal reflux disease without esophagitis: Secondary | ICD-10-CM | POA: Diagnosis not present

## 2019-02-19 DIAGNOSIS — Z9181 History of falling: Secondary | ICD-10-CM | POA: Diagnosis not present

## 2019-02-24 DIAGNOSIS — I2510 Atherosclerotic heart disease of native coronary artery without angina pectoris: Secondary | ICD-10-CM | POA: Diagnosis not present

## 2019-02-24 DIAGNOSIS — S42211D Unspecified displaced fracture of surgical neck of right humerus, subsequent encounter for fracture with routine healing: Secondary | ICD-10-CM | POA: Diagnosis not present

## 2019-02-24 DIAGNOSIS — I739 Peripheral vascular disease, unspecified: Secondary | ICD-10-CM | POA: Diagnosis not present

## 2019-02-24 DIAGNOSIS — Z48812 Encounter for surgical aftercare following surgery on the circulatory system: Secondary | ICD-10-CM | POA: Diagnosis not present

## 2019-02-24 DIAGNOSIS — I10 Essential (primary) hypertension: Secondary | ICD-10-CM | POA: Diagnosis not present

## 2019-02-24 DIAGNOSIS — I442 Atrioventricular block, complete: Secondary | ICD-10-CM | POA: Diagnosis not present

## 2019-02-25 DIAGNOSIS — S42211D Unspecified displaced fracture of surgical neck of right humerus, subsequent encounter for fracture with routine healing: Secondary | ICD-10-CM | POA: Diagnosis not present

## 2019-02-25 DIAGNOSIS — I442 Atrioventricular block, complete: Secondary | ICD-10-CM | POA: Diagnosis not present

## 2019-02-25 DIAGNOSIS — I739 Peripheral vascular disease, unspecified: Secondary | ICD-10-CM | POA: Diagnosis not present

## 2019-02-25 DIAGNOSIS — I2510 Atherosclerotic heart disease of native coronary artery without angina pectoris: Secondary | ICD-10-CM | POA: Diagnosis not present

## 2019-02-25 DIAGNOSIS — I10 Essential (primary) hypertension: Secondary | ICD-10-CM | POA: Diagnosis not present

## 2019-02-25 DIAGNOSIS — Z48812 Encounter for surgical aftercare following surgery on the circulatory system: Secondary | ICD-10-CM | POA: Diagnosis not present

## 2019-02-28 ENCOUNTER — Encounter (HOSPITAL_COMMUNITY): Payer: Self-pay | Admitting: Emergency Medicine

## 2019-02-28 ENCOUNTER — Emergency Department (HOSPITAL_COMMUNITY)
Admission: EM | Admit: 2019-02-28 | Discharge: 2019-02-28 | Disposition: A | Discharge status: Home / Self Care | Payer: Medicare Other | Attending: Emergency Medicine | Admitting: Emergency Medicine

## 2019-02-28 ENCOUNTER — Emergency Department (HOSPITAL_COMMUNITY): Payer: Medicare Other

## 2019-02-28 DIAGNOSIS — R531 Weakness: Secondary | ICD-10-CM | POA: Insufficient documentation

## 2019-02-28 DIAGNOSIS — U071 COVID-19: Secondary | ICD-10-CM | POA: Diagnosis not present

## 2019-02-28 DIAGNOSIS — Z79899 Other long term (current) drug therapy: Secondary | ICD-10-CM | POA: Diagnosis not present

## 2019-02-28 DIAGNOSIS — R630 Anorexia: Secondary | ICD-10-CM | POA: Diagnosis not present

## 2019-02-28 DIAGNOSIS — R05 Cough: Secondary | ICD-10-CM | POA: Insufficient documentation

## 2019-02-28 DIAGNOSIS — I2510 Atherosclerotic heart disease of native coronary artery without angina pectoris: Secondary | ICD-10-CM | POA: Diagnosis not present

## 2019-02-28 DIAGNOSIS — Z950 Presence of cardiac pacemaker: Secondary | ICD-10-CM | POA: Diagnosis not present

## 2019-02-28 DIAGNOSIS — Z85828 Personal history of other malignant neoplasm of skin: Secondary | ICD-10-CM | POA: Insufficient documentation

## 2019-02-28 DIAGNOSIS — I10 Essential (primary) hypertension: Secondary | ICD-10-CM | POA: Diagnosis not present

## 2019-02-28 DIAGNOSIS — Z7982 Long term (current) use of aspirin: Secondary | ICD-10-CM | POA: Insufficient documentation

## 2019-02-28 DIAGNOSIS — R509 Fever, unspecified: Secondary | ICD-10-CM | POA: Diagnosis not present

## 2019-02-28 DIAGNOSIS — Z209 Contact with and (suspected) exposure to unspecified communicable disease: Secondary | ICD-10-CM | POA: Diagnosis not present

## 2019-02-28 LAB — CBC
HCT: 46.7 % (ref 39.0–52.0)
Hemoglobin: 15.8 g/dL (ref 13.0–17.0)
MCH: 34.1 pg — ABNORMAL HIGH (ref 26.0–34.0)
MCHC: 33.8 g/dL (ref 30.0–36.0)
MCV: 100.6 fL — ABNORMAL HIGH (ref 80.0–100.0)
Platelets: 145 10*3/uL — ABNORMAL LOW (ref 150–400)
RBC: 4.64 MIL/uL (ref 4.22–5.81)
RDW: 13.2 % (ref 11.5–15.5)
WBC: 6.3 10*3/uL (ref 4.0–10.5)
nRBC: 0 % (ref 0.0–0.2)

## 2019-02-28 LAB — URINALYSIS, ROUTINE W REFLEX MICROSCOPIC
Bilirubin Urine: NEGATIVE
Glucose, UA: NEGATIVE mg/dL
Hgb urine dipstick: NEGATIVE
Ketones, ur: NEGATIVE mg/dL
Leukocytes,Ua: NEGATIVE
Nitrite: NEGATIVE
Protein, ur: 30 mg/dL — AB
Specific Gravity, Urine: 1.023 (ref 1.005–1.030)
pH: 5 (ref 5.0–8.0)

## 2019-02-28 LAB — COMPREHENSIVE METABOLIC PANEL
ALT: 39 U/L (ref 0–44)
AST: 57 U/L — ABNORMAL HIGH (ref 15–41)
Albumin: 3.2 g/dL — ABNORMAL LOW (ref 3.5–5.0)
Alkaline Phosphatase: 96 U/L (ref 38–126)
Anion gap: 11 (ref 5–15)
BUN: 22 mg/dL (ref 8–23)
CO2: 24 mmol/L (ref 22–32)
Calcium: 8.9 mg/dL (ref 8.9–10.3)
Chloride: 103 mmol/L (ref 98–111)
Creatinine, Ser: 1.06 mg/dL (ref 0.61–1.24)
GFR calc Af Amer: >60 mL/min (ref >60)
GFR calc non Af Amer: >60 mL/min (ref >60)
Glucose, Bld: 103 mg/dL — ABNORMAL HIGH (ref 70–99)
Potassium: 4.7 mmol/L (ref 3.5–5.1)
Sodium: 138 mmol/L (ref 135–145)
Total Bilirubin: 1.1 mg/dL (ref 0.3–1.2)
Total Protein: 6.1 g/dL — ABNORMAL LOW (ref 6.5–8.1)

## 2019-02-28 LAB — RESPIRATORY PANEL BY RT PCR (FLU A&B, COVID)
Influenza A by PCR: NEGATIVE
Influenza B by PCR: NEGATIVE
SARS Coronavirus 2 by RT PCR: POSITIVE — AB

## 2019-02-28 LAB — LACTIC ACID, PLASMA: Lactic Acid, Venous: 2.9 mmol/L (ref 0.5–1.9)

## 2019-02-28 MED ORDER — VANCOMYCIN HCL 1500 MG/300ML IV SOLN
1500.0000 mg | Freq: Once | INTRAVENOUS | Status: AC
  Administered 2019-02-28: 1500 mg via INTRAVENOUS
  Filled 2019-02-28: qty 300

## 2019-02-28 MED ORDER — SODIUM CHLORIDE 0.9 % IV BOLUS
1000.0000 mL | Freq: Once | INTRAVENOUS | Status: AC
  Administered 2019-02-28: 1000 mL via INTRAVENOUS

## 2019-02-28 MED ORDER — SODIUM CHLORIDE 0.9 % IV SOLN
2.0000 g | Freq: Once | INTRAVENOUS | Status: AC
  Administered 2019-02-28: 2 g via INTRAVENOUS
  Filled 2019-02-28: qty 2

## 2019-02-28 MED ORDER — VANCOMYCIN HCL IN DEXTROSE 1-5 GM/200ML-% IV SOLN
1000.0000 mg | Freq: Once | INTRAVENOUS | Status: DC
  Filled 2019-02-28: qty 200

## 2019-02-28 NOTE — ED Notes (Signed)
Spoke with pt grandson, will be en route to pick up the patient. Attempted to call Richardson Landry, pt son in [place] per pt spouse request, voicemail only.

## 2019-02-28 NOTE — Discharge Instructions (Addendum)
It was our pleasure to provide your ER care today - we hope that you feel better.  Your covid test is positive. See covid instructions and precautions.   Drink adequate fluids and get adequate nutrition. Stay active. Take full and deep breaths.   Return to ER if worse, new symptoms, increased trouble breathing, weak/fainting, or other concern.

## 2019-02-28 NOTE — ED Provider Notes (Signed)
[place] EMERGENCY DEPARTMENT Provider Note   CSN: [id number] Arrival date & time: 02/28/19  1524     History Chief Complaint  Patient presents with  . Fever    [name] is a 86 y.o. male.  Patient c/o fever to 102, and feeling generally weak. Symptoms acute onset last evening, constant, moderate, persistent. Denies knowing where fever is coming from. Patient does have non prod cough in past 2 days. No sore throat or runny nose. No known covid+ exposure. No headache. No chest pain or discomfort. No sob. No abd pain or nvd. +decreased po intake today. No dysuria. No extremity pain. No skin lesions/ulcers/redness.   The history is provided by the patient and the EMS personnel.  Fever Associated symptoms: cough   Associated symptoms: no chest pain, no confusion, no diarrhea, no dysuria, no headaches, no rash, no sore throat and no vomiting        Past Medical History:  Diagnosis Date  . Aortic atherosclerosis ([place])   . Bilateral hearing loss   . BPH (benign prostatic hyperplasia)   . Cancer ([place])    basal cell carcinoma  . Carotid artery occlusion   . Constipation   . Coronary artery disease   . DVT (deep venous thrombosis) ([place])   . Emphysema lung ([place])   . Fatty liver   . GERD (gastroesophageal reflux disease)   . History of echocardiogram    Echo 4/17: Vigorous LVEF, EF 65-70%, normal wall motion, grade 1 diastolic dysfunction, mildly dilated ascending aorta (40 mm)  . Hyperlipidemia   . Hypertension   . Insomnia   . PVD (peripheral vascular disease) ([place])   . Thyroid nodule     Patient Active Problem List   Diagnosis Date Noted  . Complete heart block ([place]) 12/20/2018  . Aftercare following surgery of the circulatory system 02/17/2014  . Bilateral carotid artery occlusion 02/17/2014  . Aftercare following surgery of the circulatory system, [place] 01/17/2013  . Numbness and tingling 02/27/2012  . Peripheral vascular disease, unspecified  ([place]) 02/27/2012  . Occlusion and stenosis of carotid artery without mention of cerebral infarction 03/14/2011    Past Surgical History:  Procedure Laterality Date  . APPENDECTOMY    . CAROTID ENDARTERECTOMY  03-05-09   right CEA  . COLONOSCOPY  Oct. 2013   one polyp  . HERNIA REPAIR    . LIPOMA EXCISION    . PACEMAKER IMPLANT N/A 12/23/2018   Procedure: PACEMAKER IMPLANT;  Surgeon: Evans Lance, MD;  Location: [place] CV LAB;  Service: Cardiovascular;  Laterality: N/A;       Family History  Problem Relation Age of Onset  . Heart disease Sister   . Cancer Sister   . Hypertension Sister   . Heart attack Sister   . Heart disease Brother   . Stroke Brother   . Hypertension Brother   . Heart disease Brother   . Hyperlipidemia Son     Social History   Tobacco Use  . Smoking status: Never Smoker  . Smokeless tobacco: Never Used  Substance Use Topics  . Alcohol use: No  . Drug use: No    Home Medications Prior to Admission medications   Medication Sig Start Date End Date Taking? Authorizing Provider  aluminum-magnesium hydroxide-simethicone (MAALOX) [id number] MG/5ML SUSP Take 30 mLs by mouth 4 (four) times daily -  before meals and at bedtime.    [provider]  apixaban (ELIQUIS) 5 MG TABS tablet Take 1  tablet (5 mg total) by mouth 2 (two) times daily. Resume 12/27/18 evening dose 12/24/18   Chanetta Marshall K, NP  aspirin EC 81 MG tablet Take 81 mg by mouth daily.     [provider]  Calcium Carbonate (CALTRATE 600 PO) Take 1 tablet by mouth daily.     [provider]  Cholecalciferol (VITAMIN D-3 PO) Take 1 tablet by mouth daily.    [provider]  Coenzyme Q10 (COQ-10) 100 MG CAPS Take 1 capsule by mouth daily.     [provider]  cyanocobalamin 100 MCG tablet Take 100 mcg by mouth daily.    [provider]  Melatonin 5 MG TABS Take 1 tablet by mouth at bedtime as needed.    [provider]   Multiple Vitamin (MULTIVITAMIN) tablet Take 1 tablet by mouth daily.    [provider]  senna (SENOKOT) 8.6 MG tablet Take 1 tablet by mouth 2 (two) times daily as needed for constipation.    [provider]  simvastatin (ZOCOR) 10 MG tablet Take 10 mg by mouth daily.    [provider]  tamsulosin (FLOMAX) 0.4 MG CAPS capsule Take 0.4 mg by mouth daily.    [provider]  zolpidem (AMBIEN) 5 MG tablet Take 5 mg by mouth at bedtime as needed for sleep.    [provider]    Allergies    Atorvastatin, Other, Rosuvastatin, and Viagra [sildenafil]  Review of Systems   Review of Systems  Constitutional: Positive for fever.  HENT: Negative for sore throat.   Eyes: Negative for redness.  Respiratory: Positive for cough. Negative for shortness of breath.   Cardiovascular: Negative for chest pain.  Gastrointestinal: Negative for abdominal pain, diarrhea and vomiting.  Endocrine: Negative for polyuria.  Genitourinary: Negative for dysuria and flank pain.  Musculoskeletal: Negative for back pain and neck pain.  Skin: Negative for rash.  Neurological: Negative for headaches.  Hematological: Does not bruise/bleed easily.  Psychiatric/Behavioral: Negative for confusion.    Physical Exam Updated Vital Signs BP 103/86   Pulse 75   Temp 98.6 F (37 C) (Oral)   Resp 20   SpO2 91%   Physical Exam Vitals and nursing note reviewed.  Constitutional:      Appearance: Normal appearance. He is well-developed.  HENT:     Head: Atraumatic.     Nose: Nose normal.     Mouth/Throat:     Mouth: Mucous membranes are moist.     Pharynx: Oropharynx is clear.  Eyes:     General: No scleral icterus.    Conjunctiva/sclera: Conjunctivae normal.  Neck:     Trachea: No tracheal deviation.     Comments: No stiffness or rigidity.  Cardiovascular:     Rate and Rhythm: Regular rhythm. Tachycardia present.     Pulses: Normal pulses.     Heart sounds: Normal  heart sounds. No murmur. No friction rub. No gallop.   Pulmonary:     Effort: Pulmonary effort is normal. No accessory muscle usage or respiratory distress.     Breath sounds: Normal breath sounds.  Abdominal:     General: Bowel sounds are normal. There is no distension.     Palpations: Abdomen is soft.     Tenderness: There is no abdominal tenderness. There is no guarding.  Genitourinary:    Comments: No cva tenderness. Musculoskeletal:        General: No swelling or tenderness.     Cervical back: Normal range  of motion and neck supple. No rigidity.  Skin:    General: Skin is warm and dry.     Findings: No rash.     Comments: No cellulitis, rash or skin lesions noted on bil exam.   Neurological:     Mental Status: He is alert.     Comments: Alert, speech clear. Motor/sens grossly intact bil.   Psychiatric:        Mood and Affect: Mood normal.     ED Results / Procedures / Treatments   Labs (all labs ordered are listed, but only abnormal results are displayed) Results for orders placed or performed during the hospital encounter of 02/28/19  Respiratory Panel by RT PCR (Flu A&B, Covid) - Nasopharyngeal Swab   Specimen: Nasopharyngeal Swab  Result Value Ref Range   SARS Coronavirus 2 by RT PCR POSITIVE (A) NEGATIVE   Influenza A by PCR NEGATIVE NEGATIVE   Influenza B by PCR NEGATIVE NEGATIVE  CBC  Result Value Ref Range   WBC 6.3 4.0 - 10.5 K/uL   RBC 4.64 4.22 - 5.81 MIL/uL   Hemoglobin 15.8 13.0 - 17.0 g/dL   HCT 46.7 39.0 - 52.0 %   MCV 100.6 (H) 80.0 - 100.0 fL   MCH 34.1 (H) 26.0 - 34.0 pg   MCHC 33.8 30.0 - 36.0 g/dL   RDW 13.2 11.5 - 15.5 %   Platelets 145 (L) 150 - 400 K/uL   nRBC 0.0 0.0 - 0.2 %  Comprehensive metabolic panel  Result Value Ref Range   Sodium 138 135 - 145 mmol/L   Potassium 4.7 3.5 - 5.1 mmol/L   Chloride 103 98 - 111 mmol/L   CO2 24 22 - 32 mmol/L   Glucose, Bld 103 (H) 70 - 99 mg/dL   BUN 22 8 - 23 mg/dL   Creatinine, Ser 1.06 0.61 -  1.24 mg/dL   Calcium 8.9 8.9 - 10.3 mg/dL   Total Protein 6.1 (L) 6.5 - 8.1 g/dL   Albumin 3.2 (L) 3.5 - 5.0 g/dL   AST 57 (H) 15 - 41 U/L   ALT 39 0 - 44 U/L   Alkaline Phosphatase 96 38 - 126 U/L   Total Bilirubin 1.1 0.3 - 1.2 mg/dL   GFR calc non Af Amer >60 >60 mL/min   GFR calc Af Amer >60 >60 mL/min   Anion gap 11 5 - 15  Urinalysis, Routine w reflex microscopic  Result Value Ref Range   Color, Urine YELLOW YELLOW   APPearance CLEAR CLEAR   Specific Gravity, Urine 1.023 1.005 - 1.030   pH 5.0 5.0 - 8.0   Glucose, UA NEGATIVE NEGATIVE mg/dL   Hgb urine dipstick NEGATIVE NEGATIVE   Bilirubin Urine NEGATIVE NEGATIVE   Ketones, ur NEGATIVE NEGATIVE mg/dL   Protein, ur 30 (A) NEGATIVE mg/dL   Nitrite NEGATIVE NEGATIVE   Leukocytes,Ua NEGATIVE NEGATIVE   RBC / HPF 0-5 0 - 5 RBC/hpf   WBC, UA 0-5 0 - 5 WBC/hpf   Bacteria, UA RARE (A) NONE SEEN   Squamous Epithelial / LPF 0-5 0 - 5   Mucus PRESENT   Lactic acid, plasma  Result Value Ref Range   Lactic Acid, Venous 2.9 (HH) 0.5 - 1.9 mmol/L   DG Chest Port 1 View  Result Date: 02/28/2019 CLINICAL DATA:  Cough, fever EXAM: PORTABLE CHEST 1 VIEW COMPARISON:  12/24/2018 FINDINGS: Stable positioning of a left-sided implanted cardiac device. Heart size is stable. Calcified thoracic aorta. No new focal  airspace consolidation, pleural effusion, or pneumothorax. Skin fold overlies the lateral aspect of the right hemithorax with lung markings extending beyond the line. Bones are demineralized. IMPRESSION: No acute cardiopulmonary findings. Electronically Signed   By: Davina Poke D.O.   On: 02/28/2019 16:37    EKG EKG Interpretation  Date/Time:  Friday February 28 2019 15:25:13 EST Ventricular Rate:  111 PR Interval:    QRS Duration: 156 QT Interval:  399 QTC Calculation: 543 R Axis:   -71 Text Interpretation: Electronic ventricular pacemaker Confirmed by Lajean Saver [phone]) on 02/28/2019 3:45:41 PM   Radiology DG Chest  Port 1 View  Result Date: 02/28/2019 CLINICAL DATA:  Cough, fever EXAM: PORTABLE CHEST 1 VIEW COMPARISON:  12/24/2018 FINDINGS: Stable positioning of a left-sided implanted cardiac device. Heart size is stable. Calcified thoracic aorta. No new focal airspace consolidation, pleural effusion, or pneumothorax. Skin fold overlies the lateral aspect of the right hemithorax with lung markings extending beyond the line. Bones are demineralized. IMPRESSION: No acute cardiopulmonary findings. Electronically Signed   By: Davina Poke D.O.   On: 02/28/2019 16:37    Procedures Procedures (including critical care time)  Medications Ordered in ED Medications  sodium chloride 0.9 % bolus 1,000 mL (has no administration in time range)    ED Course  I have reviewed the triage vital signs and the nursing notes.  Pertinent labs & imaging results that were available during my care of the patient were reviewed by me and considered in my medical decision making (see chart for details).    MDM Rules/Calculators/A&P                      Iv ns bolus. Stat labs and cultures sent. Continuous pulse ox and monitor. Pcxr.   Reviewed nursing notes and prior charts for additional history.   Labs reviewed/interpreted by me - wbc normal. Lactate is high. Ns bolus. Await covid test.   Additional labs return - ua negative for infection.   Xrays reviewed/interpreted by me - no pna.  COvid test is positive. Patient remains on contact and airborne precautions. Discussed results w pt.   D Carion Sweger was evaluated in Emergency Department on 02/28/2019 for the symptoms described in the history of present illness. He was evaluated in the context of the global COVID-19 pandemic, which necessitated consideration that the patient might be at risk for infection with the SARS-CoV-2 virus that causes COVID-19. Institutional protocols and algorithms that pertain to the evaluation of patients at risk for COVID-19 are in a state  of rapid change based on information released by regulatory bodies including the CDC and federal and state organizations. These policies and algorithms were followed during the patient's care in the ED.  Recheck pt, no increased wob. Pulse ox is 96% room air. Pt denies cp or feeling sob.   Po fluids, food. Pt remains comfortable and in no acute distress.  Patient currently appears stable for d/c.   Return precautions provided.        Final Clinical Impression(s) / ED Diagnoses Final diagnoses:  None    Rx / DC Orders ED Discharge Orders    None       Lajean Saver, MD 02/28/19 1931

## 2019-02-28 NOTE — ED Notes (Signed)
Ambulated pt in hallway, saturations 95% post ambulation, pt denied SOB. Pt given meal, orange juice. Vancomycin dose to be completed prior to discharge. Updated pt wife on status.

## 2019-02-28 NOTE — ED Triage Notes (Signed)
Pt arrives via [facility] from home with c/c of fever and chills that started last night. Pt did have pacemaker placed 4 weeks ago.

## 2019-03-01 ENCOUNTER — Other Ambulatory Visit: Payer: Self-pay | Admitting: Infectious Diseases

## 2019-03-01 ENCOUNTER — Telehealth: Payer: Self-pay | Admitting: Internal Medicine

## 2019-03-01 ENCOUNTER — Telehealth: Payer: Self-pay | Admitting: Infectious Diseases

## 2019-03-01 DIAGNOSIS — U071 COVID-19: Secondary | ICD-10-CM

## 2019-03-01 NOTE — Telephone Encounter (Signed)
Called to discuss with patient about Covid symptoms and the use of amanitin, a monoclonal antibody infusion for those with mild to moderate Covid symptoms and at a high risk of hospitalization.  Pt is qualified for this infusion at the [facility] infusion center due to Age > 75   Day 3 of symptoms now - had a bad night trying to get comfortable. Went to the ER yesterday for evaluation of fevers and URI symptoms/cough. No pneumonia on chest xray noted.   Speaking with his wife she expressed frustration with previous care at [facility] and a SNF that he was at in November 2020.   She has a pulse oximeter that she and his grandson with her at home helping.   If you have questions please call [phone]

## 2019-03-01 NOTE — Telephone Encounter (Signed)
Spoke c pt's wife regarding already scheduled appointment for monoclonal antibody infusion. She would like him moved to an earlier appointment date. Unfortunately, there are no earlier dates available at this time. The clinic has been notified of her request and will notify her if something should become available. She has been instructed to call 911 or transport pt to ED should he experience worsening of symptoms in the prior to his appointment.   Alan Ripper, NP-C [facility] [place]  pgr [phone]

## 2019-03-01 NOTE — Progress Notes (Signed)
  I connected by phone with D Lauralyn Primes on 03/01/2019 at 1:16 PM to discuss the potential use of an new treatment for mild to moderate COVID-19 viral infection in non-hospitalized patients.  This patient is a 86 y.o. male that meets the FDA criteria for Emergency Use Authorization of bamlanivimab or casirivimab\imdevimab.  Has a (+) direct SARS-CoV-2 viral test result  Has mild or moderate COVID-19   Is ? 84 years of age and weighs ? 40 kg  Is NOT hospitalized due to COVID-19  Is NOT requiring oxygen therapy or requiring an increase in baseline oxygen flow rate due to COVID-19  Is within 10 days of symptom onset  Has at least one of the high risk factor(s) for progression to severe COVID-19 and/or hospitalization as defined in EUA.  Specific high risk criteria : >/= 84 yo   I have spoken and communicated the following to the patient or parent/caregiver:  1. FDA has authorized the emergency use of bamlanivimab and casirivimab\imdevimab for the treatment of mild to moderate COVID-19 in adults and pediatric patients with positive results of direct SARS-CoV-2 viral testing who are 54 years of age and older weighing at least 40 kg, and who are at high risk for progressing to severe COVID-19 and/or hospitalization.  2. The significant known and potential risks and benefits of bamlanivimab and casirivimab\imdevimab, and the extent to which such potential risks and benefits are unknown.  3. Information on available alternative treatments and the risks and benefits of those alternatives, including clinical trials.  4. Patients treated with bamlanivimab and casirivimab\imdevimab should continue to self-isolate and use infection control measures (e.g., wear mask, isolate, social distance, avoid sharing personal items, clean and disinfect "high touch" surfaces, and frequent handwashing) according to CDC guidelines.   5. The patient or parent/caregiver has the option to accept or refuse  bamlanivimab or casirivimab\imdevimab .  After reviewing this information with the patient, The patient agreed to proceed with receiving the bamlanimivab infusion and will be provided a copy of the Fact sheet prior to receiving the infusion.Janene Madeira 03/01/2019 1:16 PM

## 2019-03-05 ENCOUNTER — Ambulatory Visit (HOSPITAL_COMMUNITY)
Admission: RE | Admit: 2019-03-05 | Discharge: 2019-03-05 | Disposition: A | Discharge status: Home / Self Care | Payer: Medicare Other | Source: Ambulatory Visit | Attending: Pulmonary Disease | Admitting: Pulmonary Disease

## 2019-03-05 DIAGNOSIS — U071 COVID-19: Secondary | ICD-10-CM | POA: Diagnosis not present

## 2019-03-05 DIAGNOSIS — Z23 Encounter for immunization: Secondary | ICD-10-CM | POA: Diagnosis not present

## 2019-03-05 LAB — CULTURE, BLOOD (ROUTINE X 2)
Culture: NO GROWTH
Culture: NO GROWTH
Special Requests: ADEQUATE

## 2019-03-05 MED ORDER — SODIUM CHLORIDE 0.9 % IV SOLN
INTRAVENOUS | Status: DC | PRN
  Administered 2019-03-05: 11:00:00 250 mL via INTRAVENOUS

## 2019-03-05 MED ORDER — METHYLPREDNISOLONE SODIUM SUCC 125 MG IJ SOLR: 125.0000 mg | Freq: Once | INTRAMUSCULAR | Status: DC | PRN

## 2019-03-05 MED ORDER — EPINEPHRINE 0.3 MG/0.3ML IJ SOAJ: 0.3000 mg | Freq: Once | INTRAMUSCULAR | Status: DC | PRN

## 2019-03-05 MED ORDER — ALBUTEROL SULFATE HFA 108 (90 BASE) MCG/ACT IN AERS: 2.0000 | INHALATION_SPRAY | Freq: Once | RESPIRATORY_TRACT | Status: DC | PRN

## 2019-03-05 MED ORDER — DIPHENHYDRAMINE HCL 50 MG/ML IJ SOLN: 50.0000 mg | Freq: Once | INTRAMUSCULAR | Status: DC | PRN

## 2019-03-05 MED ORDER — SODIUM CHLORIDE 0.9 % IV SOLN
700.0000 mg | Freq: Once | INTRAVENOUS | Status: AC
  Administered 2019-03-05: 11:00:00 700 mg via INTRAVENOUS
  Filled 2019-03-05: qty 20

## 2019-03-05 MED ORDER — FAMOTIDINE IN NACL 20-0.9 MG/50ML-% IV SOLN: 20.0000 mg | Freq: Once | INTRAVENOUS | Status: DC | PRN

## 2019-03-05 NOTE — Discharge Instructions (Signed)

## 2019-03-05 NOTE — Progress Notes (Signed)
  Diagnosis: COVID-19  Physician: Dr. Joya Gaskins  Procedure: Covid Infusion Clinic Med: bamlanivimab infusion - Provided patient with bamlanimivab fact sheet for patients, parents and caregivers prior to infusion.  Complications: No immediate complications noted.  Discharge: Discharged home   William Mann 03/05/2019

## 2019-03-12 DIAGNOSIS — I10 Essential (primary) hypertension: Secondary | ICD-10-CM | POA: Diagnosis not present

## 2019-03-12 DIAGNOSIS — S42211D Unspecified displaced fracture of surgical neck of right humerus, subsequent encounter for fracture with routine healing: Secondary | ICD-10-CM | POA: Diagnosis not present

## 2019-03-12 DIAGNOSIS — Z48812 Encounter for surgical aftercare following surgery on the circulatory system: Secondary | ICD-10-CM | POA: Diagnosis not present

## 2019-03-12 DIAGNOSIS — I442 Atrioventricular block, complete: Secondary | ICD-10-CM | POA: Diagnosis not present

## 2019-03-12 DIAGNOSIS — I739 Peripheral vascular disease, unspecified: Secondary | ICD-10-CM | POA: Diagnosis not present

## 2019-03-12 DIAGNOSIS — I2510 Atherosclerotic heart disease of native coronary artery without angina pectoris: Secondary | ICD-10-CM | POA: Diagnosis not present

## 2019-03-18 DIAGNOSIS — Z48812 Encounter for surgical aftercare following surgery on the circulatory system: Secondary | ICD-10-CM | POA: Diagnosis not present

## 2019-03-18 DIAGNOSIS — I10 Essential (primary) hypertension: Secondary | ICD-10-CM | POA: Diagnosis not present

## 2019-03-18 DIAGNOSIS — I442 Atrioventricular block, complete: Secondary | ICD-10-CM | POA: Diagnosis not present

## 2019-03-18 DIAGNOSIS — I2510 Atherosclerotic heart disease of native coronary artery without angina pectoris: Secondary | ICD-10-CM | POA: Diagnosis not present

## 2019-03-18 DIAGNOSIS — S42211D Unspecified displaced fracture of surgical neck of right humerus, subsequent encounter for fracture with routine healing: Secondary | ICD-10-CM | POA: Diagnosis not present

## 2019-03-18 DIAGNOSIS — I739 Peripheral vascular disease, unspecified: Secondary | ICD-10-CM | POA: Diagnosis not present

## 2019-03-20 DIAGNOSIS — I442 Atrioventricular block, complete: Secondary | ICD-10-CM | POA: Diagnosis not present

## 2019-03-20 DIAGNOSIS — I2510 Atherosclerotic heart disease of native coronary artery without angina pectoris: Secondary | ICD-10-CM | POA: Diagnosis not present

## 2019-03-20 DIAGNOSIS — Z48812 Encounter for surgical aftercare following surgery on the circulatory system: Secondary | ICD-10-CM | POA: Diagnosis not present

## 2019-03-20 DIAGNOSIS — S42211D Unspecified displaced fracture of surgical neck of right humerus, subsequent encounter for fracture with routine healing: Secondary | ICD-10-CM | POA: Diagnosis not present

## 2019-03-20 DIAGNOSIS — I10 Essential (primary) hypertension: Secondary | ICD-10-CM | POA: Diagnosis not present

## 2019-03-20 DIAGNOSIS — I739 Peripheral vascular disease, unspecified: Secondary | ICD-10-CM | POA: Diagnosis not present

## 2019-03-21 DIAGNOSIS — I739 Peripheral vascular disease, unspecified: Secondary | ICD-10-CM | POA: Diagnosis not present

## 2019-03-21 DIAGNOSIS — M40209 Unspecified kyphosis, site unspecified: Secondary | ICD-10-CM | POA: Diagnosis not present

## 2019-03-21 DIAGNOSIS — Z950 Presence of cardiac pacemaker: Secondary | ICD-10-CM | POA: Diagnosis not present

## 2019-03-21 DIAGNOSIS — C4491 Basal cell carcinoma of skin, unspecified: Secondary | ICD-10-CM | POA: Diagnosis not present

## 2019-03-21 DIAGNOSIS — I442 Atrioventricular block, complete: Secondary | ICD-10-CM | POA: Diagnosis not present

## 2019-03-21 DIAGNOSIS — I2510 Atherosclerotic heart disease of native coronary artery without angina pectoris: Secondary | ICD-10-CM | POA: Diagnosis not present

## 2019-03-21 DIAGNOSIS — W19XXXD Unspecified fall, subsequent encounter: Secondary | ICD-10-CM | POA: Diagnosis not present

## 2019-03-21 DIAGNOSIS — K219 Gastro-esophageal reflux disease without esophagitis: Secondary | ICD-10-CM | POA: Diagnosis not present

## 2019-03-21 DIAGNOSIS — Z7982 Long term (current) use of aspirin: Secondary | ICD-10-CM | POA: Diagnosis not present

## 2019-03-21 DIAGNOSIS — E785 Hyperlipidemia, unspecified: Secondary | ICD-10-CM | POA: Diagnosis not present

## 2019-03-21 DIAGNOSIS — Z7901 Long term (current) use of anticoagulants: Secondary | ICD-10-CM | POA: Diagnosis not present

## 2019-03-21 DIAGNOSIS — E041 Nontoxic single thyroid nodule: Secondary | ICD-10-CM | POA: Diagnosis not present

## 2019-03-21 DIAGNOSIS — I6523 Occlusion and stenosis of bilateral carotid arteries: Secondary | ICD-10-CM | POA: Diagnosis not present

## 2019-03-21 DIAGNOSIS — I700 Atherosclerosis of aorta: Secondary | ICD-10-CM | POA: Diagnosis not present

## 2019-03-21 DIAGNOSIS — I10 Essential (primary) hypertension: Secondary | ICD-10-CM | POA: Diagnosis not present

## 2019-03-21 DIAGNOSIS — K5900 Constipation, unspecified: Secondary | ICD-10-CM | POA: Diagnosis not present

## 2019-03-21 DIAGNOSIS — Z86718 Personal history of other venous thrombosis and embolism: Secondary | ICD-10-CM | POA: Diagnosis not present

## 2019-03-21 DIAGNOSIS — N400 Enlarged prostate without lower urinary tract symptoms: Secondary | ICD-10-CM | POA: Diagnosis not present

## 2019-03-21 DIAGNOSIS — S42211D Unspecified displaced fracture of surgical neck of right humerus, subsequent encounter for fracture with routine healing: Secondary | ICD-10-CM | POA: Diagnosis not present

## 2019-03-21 DIAGNOSIS — Z9181 History of falling: Secondary | ICD-10-CM | POA: Diagnosis not present

## 2019-03-21 DIAGNOSIS — K760 Fatty (change of) liver, not elsewhere classified: Secondary | ICD-10-CM | POA: Diagnosis not present

## 2019-03-21 DIAGNOSIS — Z48812 Encounter for surgical aftercare following surgery on the circulatory system: Secondary | ICD-10-CM | POA: Diagnosis not present

## 2019-03-21 DIAGNOSIS — J439 Emphysema, unspecified: Secondary | ICD-10-CM | POA: Diagnosis not present

## 2019-03-24 ENCOUNTER — Other Ambulatory Visit: Payer: Self-pay

## 2019-03-24 ENCOUNTER — Ambulatory Visit (INDEPENDENT_AMBULATORY_CARE_PROVIDER_SITE_OTHER): Payer: Medicare Other | Admitting: *Deleted

## 2019-03-24 ENCOUNTER — Ambulatory Visit (INDEPENDENT_AMBULATORY_CARE_PROVIDER_SITE_OTHER): Payer: Medicare Other | Admitting: Internal Medicine

## 2019-03-24 ENCOUNTER — Encounter: Payer: Self-pay | Admitting: Internal Medicine

## 2019-03-24 VITALS — BP 124/80 | HR 96 | Ht 68.0 in | Wt 150.2 lb

## 2019-03-24 DIAGNOSIS — I442 Atrioventricular block, complete: Secondary | ICD-10-CM | POA: Diagnosis not present

## 2019-03-24 DIAGNOSIS — Z950 Presence of cardiac pacemaker: Secondary | ICD-10-CM

## 2019-03-24 LAB — CUP PACEART REMOTE DEVICE CHECK
Battery Remaining Longevity: 74 mo
Battery Remaining Percentage: 95.5 %
Battery Voltage: 2.99 V
Brady Statistic AP VP Percent: 1.7 %
Brady Statistic AP VS Percent: 1 %
Brady Statistic AS VP Percent: 79 %
Brady Statistic AS VS Percent: 19 %
Brady Statistic RA Percent Paced: 2.1 %
Brady Statistic RV Percent Paced: 81 %
Date Time Interrogation Session: 20210215020023
Implantable Lead Implant Date: 20201116
Implantable Lead Implant Date: 20201116
Implantable Lead Location: 753859
Implantable Lead Location: 753860
Implantable Pulse Generator Implant Date: 20201116
Lead Channel Impedance Value: 430 Ohm
Lead Channel Impedance Value: 600 Ohm
Lead Channel Pacing Threshold Amplitude: 0.5 V
Lead Channel Pacing Threshold Amplitude: 0.75 V
Lead Channel Pacing Threshold Pulse Width: 0.5 ms
Lead Channel Pacing Threshold Pulse Width: 0.5 ms
Lead Channel Sensing Intrinsic Amplitude: 1.3 mV
Lead Channel Sensing Intrinsic Amplitude: 12 mV
Lead Channel Setting Pacing Amplitude: 3.5 V
Lead Channel Setting Pacing Amplitude: 3.5 V
Lead Channel Setting Pacing Pulse Width: 0.5 ms
Lead Channel Setting Sensing Sensitivity: 2 mV
Pulse Gen Model: 2272
Pulse Gen Serial Number: 9172104

## 2019-03-24 NOTE — Patient Instructions (Signed)
Medication Instructions:  Your physician recommends that you continue on your current medications as directed. Please refer to the Current Medication list given to you today.  Labwork: None ordered.  Testing/Procedures: None ordered.  Follow-Up: Your physician wants you to follow-up in: one year with Dr. Lovena Le.   You will receive a reminder letter in the mail two months in advance. If you don't receive a letter, please call our office to schedule the follow-up appointment.  Remote monitoring is used to monitor your Pacemaker from home. This monitoring reduces the number of office visits required to check your device to one time per year. It allows [place] to keep an eye on the functioning of your device to ensure it is working properly. You are scheduled for a device check from home on 06/23/2019. You may send your transmission at any time that day. If you have a wireless device, the transmission will be sent automatically. After your physician reviews your transmission, you will receive a postcard with your next transmission date.  Any Other Special Instructions Will Be Listed Below (If Applicable).  If you need a refill on your cardiac medications before your next appointment, please call your pharmacy.

## 2019-03-24 NOTE — Progress Notes (Signed)
HPI [name] returns today for followup. He is a pleasant 86 yo man with CHB, s/p PPM insertion. He has had some recovery of his AV conduction. He had a covid infection in January. He has recovered. He denies chest pain, sob, or edema. No syncope. No fever or chills. Allergies  Allergen Reactions  . Atorvastatin Other (See Comments)    Liver inflammation  Liver inflammation   . Other   . Rosuvastatin Other (See Comments)    Myalgia  Myalgia  . Viagra [Sildenafil]      Current Outpatient Medications  Medication Sig Dispense Refill  . apixaban (ELIQUIS) 5 MG TABS tablet Take 1 tablet (5 mg total) by mouth 2 (two) times daily. Resume 12/27/18 evening dose 60 tablet 1  . aspirin EC 81 MG tablet Take 81 mg by mouth daily.     . Calcium Carbonate (CALTRATE 600 PO) Take 1 tablet by mouth daily.     . Cholecalciferol (VITAMIN D-3 PO) Take 1 tablet by mouth daily.    . Coenzyme Q10 (COQ-10) 100 MG CAPS Take 1 capsule by mouth daily.     . cyanocobalamin 100 MCG tablet Take 100 mcg by mouth daily.    . Melatonin 5 MG TABS Take 1 tablet by mouth at bedtime as needed.    . Multiple Vitamin (MULTIVITAMIN) tablet Take 1 tablet by mouth daily.    Marland Kitchen senna (SENOKOT) 8.6 MG tablet Take 1 tablet by mouth 2 (two) times daily as needed for constipation.    . simvastatin (ZOCOR) 10 MG tablet Take 10 mg by mouth daily.    . tamsulosin (FLOMAX) 0.4 MG CAPS capsule Take 0.4 mg by mouth daily.    Marland Kitchen zolpidem (AMBIEN) 5 MG tablet Take 5 mg by mouth at bedtime as needed for sleep.     No current facility-administered medications for this visit.     Past Medical History:  Diagnosis Date  . Aortic atherosclerosis ([place])   . Bilateral hearing loss   . BPH (benign prostatic hyperplasia)   . Cancer ([place])    basal cell carcinoma  . Carotid artery occlusion   . Constipation   . Coronary artery disease   . DVT (deep venous thrombosis) ([place])   . Emphysema lung ([place])   . Fatty liver   . GERD  (gastroesophageal reflux disease)   . History of echocardiogram    Echo 4/17: Vigorous LVEF, EF 65-70%, normal wall motion, grade 1 diastolic dysfunction, mildly dilated ascending aorta (40 mm)  . Hyperlipidemia   . Hypertension   . Insomnia   . PVD (peripheral vascular disease) ([place])   . Thyroid nodule     ROS:   All systems reviewed and negative except as noted in the HPI.   Past Surgical History:  Procedure Laterality Date  . APPENDECTOMY    . CAROTID ENDARTERECTOMY  03-05-09   right CEA  . COLONOSCOPY  Oct. 2013   one polyp  . HERNIA REPAIR    . LIPOMA EXCISION    . PACEMAKER IMPLANT N/A 12/23/2018   Procedure: PACEMAKER IMPLANT;  Surgeon: William Lance, MD;  Location: [facility];  Service: Cardiovascular;  Laterality: N/A;     Family History  Problem Relation Age of Onset  . Heart disease Sister   . Cancer Sister   . Hypertension Sister   . Heart attack Sister   . Heart disease Brother   . Stroke Brother   . Hypertension Brother   .  Heart disease Brother   . Hyperlipidemia Son      Social History   Socioeconomic History  . Marital status: Married    Spouse name: Not on file  . Number of children: Not on file  . Years of education: Not on file  . Highest education level: Not on file  Occupational History  . Not on file  Tobacco Use  . Smoking status: Never Smoker  . Smokeless tobacco: Never Used  Substance and Sexual Activity  . Alcohol use: No  . Drug use: No  . Sexual activity: Not Currently  Other Topics Concern  . Not on file  Social History Narrative  . Not on file   Social Determinants of Health   Financial Resource Strain:   . Difficulty of Paying Living Expenses: Not on file  Food Insecurity:   . Worried About Charity fundraiser in the Last Year: Not on file  . Ran Out of Food in the Last Year: Not on file  Transportation Needs:   . Lack of Transportation (Medical): Not on file  . Lack of Transportation (Non-Medical): Not  on file  Physical Activity:   . Days of Exercise per Week: Not on file  . Minutes of Exercise per Session: Not on file  Stress:   . Feeling of Stress : Not on file  Social Connections:   . Frequency of Communication with Friends and Family: Not on file  . Frequency of Social Gatherings with Friends and Family: Not on file  . Attends Religious Services: Not on file  . Active Member of Clubs or Organizations: Not on file  . Attends Archivist Meetings: Not on file  . Marital Status: Not on file  Intimate Partner Violence:   . Fear of Current or Ex-Partner: Not on file  . Emotionally Abused: Not on file  . Physically Abused: Not on file  . Sexually Abused: Not on file     BP 124/80   Pulse 96   Ht 5\' 8"  (1.727 m)   Wt 150 lb 3.2 oz (68.1 kg)   SpO2 98%   BMI 22.84 kg/m   Physical Exam:  Well appearing NAD HEENT: Unremarkable Neck:  No JVD, no thyromegally Lymphatics:  No adenopathy Back:  No CVA tenderness Lungs:  Clear with no wheezes HEART:  Regular rate rhythm, no murmurs, no rubs, no clicks Abd:  soft, positive bowel sounds, no organomegally, no rebound, no guarding Ext:  2 plus pulses, no edema, no cyanosis, no clubbing Skin:  No rashes no nodules Neuro:  CN II through XII intact, motor grossly intact  EKG - NSR with ventricular pacing  DEVICE  Normal device function.  See PaceArt for details.   Assess/Plan: 1. CHB - his conduction has returned and we have reprogrammed his DDD St. Jude PPM to encouraged intrinsic AV conduction.  2. Covid infection - he has recovered and denies any symptoms.  3. HTN - his BP is well compensated. He will continue his current meds. 4. PPM - his St. Jude DDD PM is working normally. We will recheck in several months.   Mikle Bosworth.D.

## 2019-03-25 NOTE — Progress Notes (Signed)
PPM Remote  

## 2019-03-31 LAB — CUP PACEART INCLINIC DEVICE CHECK
Brady Statistic RA Percent Paced: 2.1 %
Brady Statistic RV Percent Paced: 81 %
Date Time Interrogation Session: 20210215095802
Implantable Lead Implant Date: 20201116
Implantable Lead Implant Date: 20201116
Implantable Lead Location: 753859
Implantable Lead Location: 753860
Implantable Pulse Generator Implant Date: 20201116
Lead Channel Pacing Threshold Amplitude: 0.5 V
Lead Channel Pacing Threshold Amplitude: 1 V
Lead Channel Pacing Threshold Pulse Width: 0.5 ms
Lead Channel Pacing Threshold Pulse Width: 0.5 ms
Lead Channel Sensing Intrinsic Amplitude: 0.6 mV
Lead Channel Sensing Intrinsic Amplitude: 12 mV
Pulse Gen Model: 2272
Pulse Gen Serial Number: 9172104

## 2019-04-20 DIAGNOSIS — U071 COVID-19: Secondary | ICD-10-CM | POA: Diagnosis not present

## 2019-04-20 DIAGNOSIS — Z9181 History of falling: Secondary | ICD-10-CM | POA: Diagnosis not present

## 2019-04-20 DIAGNOSIS — I10 Essential (primary) hypertension: Secondary | ICD-10-CM | POA: Diagnosis not present

## 2019-04-20 DIAGNOSIS — Z7901 Long term (current) use of anticoagulants: Secondary | ICD-10-CM | POA: Diagnosis not present

## 2019-04-20 DIAGNOSIS — N400 Enlarged prostate without lower urinary tract symptoms: Secondary | ICD-10-CM | POA: Diagnosis not present

## 2019-04-20 DIAGNOSIS — Z950 Presence of cardiac pacemaker: Secondary | ICD-10-CM | POA: Diagnosis not present

## 2019-04-20 DIAGNOSIS — E041 Nontoxic single thyroid nodule: Secondary | ICD-10-CM | POA: Diagnosis not present

## 2019-04-20 DIAGNOSIS — I739 Peripheral vascular disease, unspecified: Secondary | ICD-10-CM | POA: Diagnosis not present

## 2019-04-20 DIAGNOSIS — I442 Atrioventricular block, complete: Secondary | ICD-10-CM | POA: Diagnosis not present

## 2019-04-20 DIAGNOSIS — E785 Hyperlipidemia, unspecified: Secondary | ICD-10-CM | POA: Diagnosis not present

## 2019-04-20 DIAGNOSIS — I6523 Occlusion and stenosis of bilateral carotid arteries: Secondary | ICD-10-CM | POA: Diagnosis not present

## 2019-04-20 DIAGNOSIS — Z86718 Personal history of other venous thrombosis and embolism: Secondary | ICD-10-CM | POA: Diagnosis not present

## 2019-04-20 DIAGNOSIS — Z7982 Long term (current) use of aspirin: Secondary | ICD-10-CM | POA: Diagnosis not present

## 2019-04-20 DIAGNOSIS — K5900 Constipation, unspecified: Secondary | ICD-10-CM | POA: Diagnosis not present

## 2019-04-20 DIAGNOSIS — M40209 Unspecified kyphosis, site unspecified: Secondary | ICD-10-CM | POA: Diagnosis not present

## 2019-04-20 DIAGNOSIS — W19XXXD Unspecified fall, subsequent encounter: Secondary | ICD-10-CM | POA: Diagnosis not present

## 2019-04-20 DIAGNOSIS — K760 Fatty (change of) liver, not elsewhere classified: Secondary | ICD-10-CM | POA: Diagnosis not present

## 2019-04-20 DIAGNOSIS — J439 Emphysema, unspecified: Secondary | ICD-10-CM | POA: Diagnosis not present

## 2019-04-20 DIAGNOSIS — I2510 Atherosclerotic heart disease of native coronary artery without angina pectoris: Secondary | ICD-10-CM | POA: Diagnosis not present

## 2019-04-20 DIAGNOSIS — K219 Gastro-esophageal reflux disease without esophagitis: Secondary | ICD-10-CM | POA: Diagnosis not present

## 2019-04-20 DIAGNOSIS — I700 Atherosclerosis of aorta: Secondary | ICD-10-CM | POA: Diagnosis not present

## 2019-04-20 DIAGNOSIS — C4491 Basal cell carcinoma of skin, unspecified: Secondary | ICD-10-CM | POA: Diagnosis not present

## 2019-04-20 DIAGNOSIS — S42211D Unspecified displaced fracture of surgical neck of right humerus, subsequent encounter for fracture with routine healing: Secondary | ICD-10-CM | POA: Diagnosis not present

## 2019-04-21 DIAGNOSIS — I739 Peripheral vascular disease, unspecified: Secondary | ICD-10-CM | POA: Diagnosis not present

## 2019-04-21 DIAGNOSIS — I442 Atrioventricular block, complete: Secondary | ICD-10-CM | POA: Diagnosis not present

## 2019-04-21 DIAGNOSIS — S42211D Unspecified displaced fracture of surgical neck of right humerus, subsequent encounter for fracture with routine healing: Secondary | ICD-10-CM | POA: Diagnosis not present

## 2019-04-21 DIAGNOSIS — I2510 Atherosclerotic heart disease of native coronary artery without angina pectoris: Secondary | ICD-10-CM | POA: Diagnosis not present

## 2019-04-21 DIAGNOSIS — U071 COVID-19: Secondary | ICD-10-CM | POA: Diagnosis not present

## 2019-04-21 DIAGNOSIS — I10 Essential (primary) hypertension: Secondary | ICD-10-CM | POA: Diagnosis not present

## 2019-04-28 DIAGNOSIS — I2510 Atherosclerotic heart disease of native coronary artery without angina pectoris: Secondary | ICD-10-CM | POA: Diagnosis not present

## 2019-04-28 DIAGNOSIS — I10 Essential (primary) hypertension: Secondary | ICD-10-CM | POA: Diagnosis not present

## 2019-04-28 DIAGNOSIS — U071 COVID-19: Secondary | ICD-10-CM | POA: Diagnosis not present

## 2019-04-28 DIAGNOSIS — S42211D Unspecified displaced fracture of surgical neck of right humerus, subsequent encounter for fracture with routine healing: Secondary | ICD-10-CM | POA: Diagnosis not present

## 2019-04-28 DIAGNOSIS — I739 Peripheral vascular disease, unspecified: Secondary | ICD-10-CM | POA: Diagnosis not present

## 2019-04-28 DIAGNOSIS — I442 Atrioventricular block, complete: Secondary | ICD-10-CM | POA: Diagnosis not present

## 2019-05-05 DIAGNOSIS — I10 Essential (primary) hypertension: Secondary | ICD-10-CM | POA: Diagnosis not present

## 2019-05-05 DIAGNOSIS — I739 Peripheral vascular disease, unspecified: Secondary | ICD-10-CM | POA: Diagnosis not present

## 2019-05-05 DIAGNOSIS — I2510 Atherosclerotic heart disease of native coronary artery without angina pectoris: Secondary | ICD-10-CM | POA: Diagnosis not present

## 2019-05-05 DIAGNOSIS — I442 Atrioventricular block, complete: Secondary | ICD-10-CM | POA: Diagnosis not present

## 2019-05-05 DIAGNOSIS — S42211D Unspecified displaced fracture of surgical neck of right humerus, subsequent encounter for fracture with routine healing: Secondary | ICD-10-CM | POA: Diagnosis not present

## 2019-05-05 DIAGNOSIS — U071 COVID-19: Secondary | ICD-10-CM | POA: Diagnosis not present

## 2019-05-14 DIAGNOSIS — S42211D Unspecified displaced fracture of surgical neck of right humerus, subsequent encounter for fracture with routine healing: Secondary | ICD-10-CM | POA: Diagnosis not present

## 2019-05-14 DIAGNOSIS — I442 Atrioventricular block, complete: Secondary | ICD-10-CM | POA: Diagnosis not present

## 2019-05-14 DIAGNOSIS — I739 Peripheral vascular disease, unspecified: Secondary | ICD-10-CM | POA: Diagnosis not present

## 2019-05-14 DIAGNOSIS — I2510 Atherosclerotic heart disease of native coronary artery without angina pectoris: Secondary | ICD-10-CM | POA: Diagnosis not present

## 2019-05-14 DIAGNOSIS — I10 Essential (primary) hypertension: Secondary | ICD-10-CM | POA: Diagnosis not present

## 2019-05-14 DIAGNOSIS — U071 COVID-19: Secondary | ICD-10-CM | POA: Diagnosis not present

## 2019-05-27 ENCOUNTER — Other Ambulatory Visit: Payer: Self-pay

## 2019-05-27 ENCOUNTER — Emergency Department (HOSPITAL_COMMUNITY)
Admission: EM | Admit: 2019-05-27 | Discharge: 2019-05-27 | Disposition: A | Discharge status: Home / Self Care | Payer: Medicare Other | Source: Home / Self Care | Attending: Emergency Medicine | Admitting: Emergency Medicine

## 2019-05-27 ENCOUNTER — Emergency Department (HOSPITAL_COMMUNITY): Payer: Medicare Other

## 2019-05-27 ENCOUNTER — Telehealth: Payer: Self-pay | Admitting: Internal Medicine

## 2019-05-27 DIAGNOSIS — Z79899 Other long term (current) drug therapy: Secondary | ICD-10-CM | POA: Insufficient documentation

## 2019-05-27 DIAGNOSIS — R4182 Altered mental status, unspecified: Secondary | ICD-10-CM | POA: Diagnosis not present

## 2019-05-27 DIAGNOSIS — W0110XA Fall on same level from slipping, tripping and stumbling with subsequent striking against unspecified object, initial encounter: Secondary | ICD-10-CM | POA: Insufficient documentation

## 2019-05-27 DIAGNOSIS — R29810 Facial weakness: Secondary | ICD-10-CM | POA: Diagnosis not present

## 2019-05-27 DIAGNOSIS — R413 Other amnesia: Secondary | ICD-10-CM | POA: Insufficient documentation

## 2019-05-27 DIAGNOSIS — R079 Chest pain, unspecified: Secondary | ICD-10-CM | POA: Diagnosis not present

## 2019-05-27 DIAGNOSIS — I6381 Other cerebral infarction due to occlusion or stenosis of small artery: Secondary | ICD-10-CM | POA: Diagnosis not present

## 2019-05-27 DIAGNOSIS — R000 Tachycardia, unspecified: Secondary | ICD-10-CM | POA: Diagnosis not present

## 2019-05-27 DIAGNOSIS — S0990XA Unspecified injury of head, initial encounter: Secondary | ICD-10-CM

## 2019-05-27 DIAGNOSIS — G8191 Hemiplegia, unspecified affecting right dominant side: Secondary | ICD-10-CM | POA: Diagnosis not present

## 2019-05-27 DIAGNOSIS — G9341 Metabolic encephalopathy: Secondary | ICD-10-CM | POA: Diagnosis not present

## 2019-05-27 DIAGNOSIS — W19XXXA Unspecified fall, initial encounter: Secondary | ICD-10-CM

## 2019-05-27 DIAGNOSIS — Y999 Unspecified external cause status: Secondary | ICD-10-CM | POA: Insufficient documentation

## 2019-05-27 DIAGNOSIS — Z7901 Long term (current) use of anticoagulants: Secondary | ICD-10-CM | POA: Insufficient documentation

## 2019-05-27 DIAGNOSIS — Y929 Unspecified place or not applicable: Secondary | ICD-10-CM | POA: Insufficient documentation

## 2019-05-27 DIAGNOSIS — R270 Ataxia, unspecified: Secondary | ICD-10-CM | POA: Diagnosis not present

## 2019-05-27 DIAGNOSIS — T1490XA Injury, unspecified, initial encounter: Secondary | ICD-10-CM

## 2019-05-27 DIAGNOSIS — I639 Cerebral infarction, unspecified: Secondary | ICD-10-CM | POA: Diagnosis not present

## 2019-05-27 DIAGNOSIS — R001 Bradycardia, unspecified: Secondary | ICD-10-CM | POA: Diagnosis not present

## 2019-05-27 DIAGNOSIS — S199XXA Unspecified injury of neck, initial encounter: Secondary | ICD-10-CM | POA: Diagnosis not present

## 2019-05-27 DIAGNOSIS — S3993XA Unspecified injury of pelvis, initial encounter: Secondary | ICD-10-CM | POA: Diagnosis not present

## 2019-05-27 DIAGNOSIS — E872 Acidosis: Secondary | ICD-10-CM | POA: Diagnosis not present

## 2019-05-27 DIAGNOSIS — I442 Atrioventricular block, complete: Secondary | ICD-10-CM | POA: Diagnosis not present

## 2019-05-27 DIAGNOSIS — S3992XA Unspecified injury of lower back, initial encounter: Secondary | ICD-10-CM | POA: Diagnosis not present

## 2019-05-27 DIAGNOSIS — R531 Weakness: Secondary | ICD-10-CM | POA: Diagnosis not present

## 2019-05-27 DIAGNOSIS — Y939 Activity, unspecified: Secondary | ICD-10-CM | POA: Insufficient documentation

## 2019-05-27 DIAGNOSIS — R220 Localized swelling, mass and lump, head: Secondary | ICD-10-CM | POA: Diagnosis not present

## 2019-05-27 DIAGNOSIS — Z20822 Contact with and (suspected) exposure to covid-19: Secondary | ICD-10-CM | POA: Diagnosis not present

## 2019-05-27 DIAGNOSIS — F0390 Unspecified dementia without behavioral disturbance: Secondary | ICD-10-CM | POA: Diagnosis not present

## 2019-05-27 LAB — COMPREHENSIVE METABOLIC PANEL
ALT: 25 U/L (ref 0–44)
AST: 31 U/L (ref 15–41)
Albumin: 3.2 g/dL — ABNORMAL LOW (ref 3.5–5.0)
Alkaline Phosphatase: 63 U/L (ref 38–126)
Anion gap: 9 (ref 5–15)
BUN: 17 mg/dL (ref 8–23)
CO2: 22 mmol/L (ref 22–32)
Calcium: 9.2 mg/dL (ref 8.9–10.3)
Chloride: 110 mmol/L (ref 98–111)
Creatinine, Ser: 0.98 mg/dL (ref 0.61–1.24)
GFR calc Af Amer: >60 mL/min (ref >60)
GFR calc non Af Amer: >60 mL/min (ref >60)
Glucose, Bld: 135 mg/dL — ABNORMAL HIGH (ref 70–99)
Potassium: 3.8 mmol/L (ref 3.5–5.1)
Sodium: 141 mmol/L (ref 135–145)
Total Bilirubin: 0.7 mg/dL (ref 0.3–1.2)
Total Protein: 5.9 g/dL — ABNORMAL LOW (ref 6.5–8.1)

## 2019-05-27 LAB — CBC
HCT: 43.6 % (ref 39.0–52.0)
Hemoglobin: 14.3 g/dL (ref 13.0–17.0)
MCH: 34 pg (ref 26.0–34.0)
MCHC: 32.8 g/dL (ref 30.0–36.0)
MCV: 103.6 fL — ABNORMAL HIGH (ref 80.0–100.0)
Platelets: 160 10*3/uL (ref 150–400)
RBC: 4.21 MIL/uL — ABNORMAL LOW (ref 4.22–5.81)
RDW: 13.9 % (ref 11.5–15.5)
WBC: 6.6 10*3/uL (ref 4.0–10.5)
nRBC: 0 % (ref 0.0–0.2)

## 2019-05-27 LAB — PROTIME-INR
INR: 1.1 (ref 0.8–1.2)
Prothrombin Time: 14.6 seconds (ref 11.4–15.2)

## 2019-05-27 LAB — ETHANOL: Alcohol, Ethyl (B): <10 mg/dL (ref <10)

## 2019-05-27 LAB — URINALYSIS, ROUTINE W REFLEX MICROSCOPIC
Bilirubin Urine: NEGATIVE
Glucose, UA: NEGATIVE mg/dL
Hgb urine dipstick: NEGATIVE
Ketones, ur: NEGATIVE mg/dL
Leukocytes,Ua: NEGATIVE
Nitrite: NEGATIVE
Protein, ur: NEGATIVE mg/dL
Specific Gravity, Urine: 1.018 (ref 1.005–1.030)
pH: 6 (ref 5.0–8.0)

## 2019-05-27 LAB — LACTIC ACID, PLASMA: Lactic Acid, Venous: 2.3 mmol/L (ref 0.5–1.9)

## 2019-05-27 MED ORDER — SODIUM CHLORIDE 0.9 % IV BOLUS
1000.0000 mL | Freq: Once | INTRAVENOUS | Status: AC
  Administered 2019-05-27: 1000 mL via INTRAVENOUS

## 2019-05-27 NOTE — ED Notes (Signed)
Patient verbalizes understanding of discharge instructions. Opportunity for questioning and answers were provided. Pt discharged from ED stable & ambulatory.   

## 2019-05-27 NOTE — ED Triage Notes (Signed)
Pt reports two falls today, one at 11 this morning when his right leg gave out, hitting the front of his head and then 2:30pm this afternoon when he hit the back of his head. The pt denies any LOC or pain at this time.

## 2019-05-27 NOTE — Progress Notes (Signed)
Orthopedic Tech Progress Note Patient Details:  William Mann 12-10-1929 [id number] Level 2 trauma Patient ID: [name], male   DOB: 27-May-1929, 86 y.o.   MRN: [id number]   Janit Pagan 05/27/2019, 4:25 PM

## 2019-05-27 NOTE — ED Provider Notes (Signed)
[facility] EMERGENCY DEPARTMENT Provider Note   CSN: [id number] Arrival date & time: 05/27/19  1615     History Chief Complaint  Patient presents with  . Fall    JAXTIN MIHELIC is a 86 y.o. male.  HPI   Patient presents after a series of fall that occurred today.  Patient does have some memory loss, as well as other medical issues, including stroke, takes Eliquis daily. Today the patient had 2 distinct falls, is unclear if there is any weakness prior to these, but the patient states that he felt unsteady, fell initially striking the back of his head.  Shortly afterwards patient fell again, striking his forehead.  Currently the patient complains of pain in his forehead, denies neck pain, chest pain, abdominal pain, weakness in any extremity.  He does acknowledge ongoing generalized weakness/atrophy as well as gait instability.  He notes ongoing issues with right leg giving out sensation. Patient is here with his wife who assists with his HPI.  Past medical history from merged chart: Occlusion and stenosis of carotid artery without mention of cerebral infarction Peripheral vascular disease, unspecified (HCC) Bilateral carotid artery occlusion Complete heart block (HCC)  Other Numbness and tingling Aftercare following surgery of the circulatory system, NEC Aftercare following surgery of the circulatory system   Social history: Lives with wife, non-smoker, no drugs Home Medications Prior to Admission medications   Medication Sig Start Date End Date Taking? Authorizing Provider  ELIQUIS 5 MG TABS tablet Take 1 tablet by mouth in the morning and at bedtime. 12/31/18  Yes [provider]  simvastatin (ZOCOR) 10 MG tablet Take 10 mg by mouth at bedtime. 05/07/19  Yes [provider]  tamsulosin (FLOMAX) 0.4 MG CAPS capsule Take 0.4 mg by mouth daily. 05/07/19  Yes [provider]  zolpidem (AMBIEN) 5 MG tablet Take 5 mg by mouth at bedtime  as needed. 12/30/18  Yes [provider]    Allergies    Patient has no known allergies.  Review of Systems   Review of Systems  Constitutional:       Per HPI, otherwise negative  HENT:       Per HPI, otherwise negative  Respiratory:       Per HPI, otherwise negative  Cardiovascular:       Per HPI, otherwise negative  Gastrointestinal: Negative for vomiting.  Endocrine:       Negative aside from HPI  Genitourinary:       Neg aside from HPI   Musculoskeletal:       Per HPI, otherwise negative  Skin: Positive for wound.  Allergic/Immunologic: Negative for immunocompromised state.  Neurological: Positive for weakness. Negative for syncope.    Physical Exam Updated Vital Signs BP (!) 162/97   Pulse 79   Temp 97.6 F (36.4 C)   Resp 19   Ht 5\' 8"  (1.727 m)   Wt 68 kg   SpO2 99%   BMI 22.81 kg/m   Physical Exam Vitals and nursing note reviewed.  Constitutional:      General: He is not in acute distress.    Appearance: He is well-developed. He is ill-appearing.  HENT:     Head: Normocephalic.   Eyes:     Conjunctiva/sclera: Conjunctivae normal.  Cardiovascular:     Rate and Rhythm: Normal rate and regular rhythm.  Pulmonary:     Effort: Pulmonary effort is normal. No respiratory distress.     Breath sounds: No stridor.  Abdominal:  General: There is no distension.  Skin:    General: Skin is warm and dry.  Neurological:     Mental Status: He is alert.     Sensory: No sensory deficit.     Motor: Weakness and atrophy present. No tremor.  Psychiatric:        Behavior: Behavior is withdrawn.     ED Results / Procedures / Treatments   Labs (all labs ordered are listed, but only abnormal results are displayed) Labs Reviewed  COMPREHENSIVE METABOLIC PANEL - Abnormal; Notable for the following components:      Result Value   Glucose, Bld 135 (*)    Total Protein 5.9 (*)    Albumin 3.2 (*)    All other components within normal limits  CBC -  Abnormal; Notable for the following components:   RBC 4.21 (*)    MCV 103.6 (*)    All other components within normal limits  LACTIC ACID, PLASMA - Abnormal; Notable for the following components:   Lactic Acid, Venous 2.3 (*)    All other components within normal limits  ETHANOL  URINALYSIS, ROUTINE W REFLEX MICROSCOPIC  PROTIME-INR    Cardiac monitor paced, 70, unremarkable Radiology CT HEAD WO CONTRAST  Result Date: 05/27/2019 CLINICAL DATA:  Two fall EXAM: CT HEAD WITHOUT CONTRAST TECHNIQUE: Contiguous axial images were obtained from the base of the skull through the vertex without intravenous contrast. COMPARISON:  None. FINDINGS: Brain: No evidence of acute territorial infarction, hemorrhage, hydrocephalus,extra-axial collection or mass lesion/mass effect. There is dilatation the ventricles and sulci consistent with age-related atrophy. Low-attenuation changes in the deep white matter consistent with small vessel ischemia. Vascular: No hyperdense vessel or unexpected calcification. Skull: The skull is intact. No fracture or focal lesion identified. Mild soft tissue swelling seen over the right frontal skull. Sinuses/Orbits: The visualized paranasal sinuses and mastoid air cells are clear. The orbits and globes intact. Other: None Cervical spine: Alignment: There is a levoconvex scoliotic curvature of the cervical spine. Skull base and vertebrae: Visualized skull base is intact. No atlanto-occipital dissociation. The vertebral body heights are well maintained. No fracture or pathologic osseous lesion seen. There is ankylosis seen of the bilateral C2-C3 transverse processes. Soft tissues and spinal canal: The visualized paraspinal soft tissues are unremarkable. No prevertebral soft tissue swelling is seen. The spinal canal is grossly unremarkable, no large epidural collection or significant canal narrowing. Disc levels: Multilevel cervical spine spondylosis is seen with disc height loss and  anterior osteophytes, disc osteophyte complex and uncovertebral osteophytes which is most notable at C5-C6 and C6-C7 with moderate neural foraminal narrowing and mild central canal stenosis. Upper chest: The lung apices are clear. Thoracic inlet is within normal limits. Other: None IMPRESSION: No acute intracranial abnormality. Findings consistent with age related atrophy and chronic small vessel ischemia Mild soft tissue swelling seen over the right frontal skull. No acute fracture or malalignment of the spine. Cervical spine spondylosis most notable from C5-C7. Electronically Signed   By: Prudencio Pair M.D.   On: 05/27/2019 19:17   CT CERVICAL SPINE WO CONTRAST  Result Date: 05/27/2019 CLINICAL DATA:  Two fall EXAM: CT HEAD WITHOUT CONTRAST TECHNIQUE: Contiguous axial images were obtained from the base of the skull through the vertex without intravenous contrast. COMPARISON:  None. FINDINGS: Brain: No evidence of acute territorial infarction, hemorrhage, hydrocephalus,extra-axial collection or mass lesion/mass effect. There is dilatation the ventricles and sulci consistent with age-related atrophy. Low-attenuation changes in the deep white matter consistent with small  vessel ischemia. Vascular: No hyperdense vessel or unexpected calcification. Skull: The skull is intact. No fracture or focal lesion identified. Mild soft tissue swelling seen over the right frontal skull. Sinuses/Orbits: The visualized paranasal sinuses and mastoid air cells are clear. The orbits and globes intact. Other: None Cervical spine: Alignment: There is a levoconvex scoliotic curvature of the cervical spine. Skull base and vertebrae: Visualized skull base is intact. No atlanto-occipital dissociation. The vertebral body heights are well maintained. No fracture or pathologic osseous lesion seen. There is ankylosis seen of the bilateral C2-C3 transverse processes. Soft tissues and spinal canal: The visualized paraspinal soft tissues are  unremarkable. No prevertebral soft tissue swelling is seen. The spinal canal is grossly unremarkable, no large epidural collection or significant canal narrowing. Disc levels: Multilevel cervical spine spondylosis is seen with disc height loss and anterior osteophytes, disc osteophyte complex and uncovertebral osteophytes which is most notable at C5-C6 and C6-C7 with moderate neural foraminal narrowing and mild central canal stenosis. Upper chest: The lung apices are clear. Thoracic inlet is within normal limits. Other: None IMPRESSION: No acute intracranial abnormality. Findings consistent with age related atrophy and chronic small vessel ischemia Mild soft tissue swelling seen over the right frontal skull. No acute fracture or malalignment of the spine. Cervical spine spondylosis most notable from C5-C7. Electronically Signed   By: Prudencio Pair M.D.   On: 05/27/2019 19:17   DG Pelvis Portable  Result Date: 05/27/2019 CLINICAL DATA:  86 year old male with fall. EXAM: PORTABLE PELVIS 1-2 VIEWS COMPARISON:  None. FINDINGS: There is no acute fracture or dislocation. The bones are osteopenic. Mild bilateral hip osteoarthritic changes. There is degenerative changes of the lower lumbar spine. The soft tissues are unremarkable. IMPRESSION: No acute fracture or dislocation. Electronically Signed   By: Anner Crete M.D.   On: 05/27/2019 16:50    Procedures Procedures (including critical care time)  Medications Ordered in ED Medications  sodium chloride 0.9 % bolus 1,000 mL (0 mLs Intravenous Stopped 05/27/19 1918)    ED Course  I have reviewed the triage vital signs and the nursing notes.  Pertinent labs & imaging results that were available during my care of the patient were reviewed by me and considered in my medical decision making (see chart for details).  7:53 PM I reviewed the patient's imaging results, lab results, most notable for demonstration of mild dehydration.  Patient has received 1 L  fluid bolus.  He is currently hemodynamically unremarkable, has no notable cardiac arrhythmia, and continues to have pacemaker function demonstrated on monitor, with unremarkable paced rhythm about 70. We reviewed all findings including reassuring CT scan, no evidence for intracranial hemorrhage, fracture, head or neck.  Patient is on Eliquis for history of DVT, and though he does have some sense of his right leg giving out, his neurologic exam today is generally reassuring, absent new focal deficit, with reassuring CT, low suspicion for new stroke.  Patient and wife aware of need for close outpatient follow-up given his frequent falls, anticoagulant use, and both are amenable to following up with his primary care physician. Final Clinical Impression(s) / ED Diagnoses Final diagnoses:  Trauma  Injury of head, initial encounter  Fall, initial encounter     Carmin Muskrat, MD 05/27/19 Karl Bales

## 2019-05-27 NOTE — Discharge Instructions (Addendum)
As discussed, your evaluation today has been largely reassuring.  But, it is important that you monitor your condition carefully, and do not hesitate to return to the ED if you develop new, or concerning changes in your condition. ? ?Otherwise, please follow-up with your physician for appropriate ongoing care. ? ?

## 2019-05-27 NOTE — Telephone Encounter (Signed)
Returned call to Pt's wife.  Pt wife states cannot bear weight on right leg.  Advised wife to call PCP ASAP for further advice.  Wife indicates understanding.

## 2019-05-27 NOTE — ED Notes (Signed)
Pt given [place] sandwich & ice water per MD Vanita Panda

## 2019-05-27 NOTE — Telephone Encounter (Signed)
Patient's wife calling stating she would like to speak with a nurse, because this morning the patient was shaving and his right leg gave out. The patient's son was present and also states the patient is currently with him and is not having any pain or dizziness. He states his HR was 86 and his oxygen is at 96%.

## 2019-05-28 ENCOUNTER — Inpatient Hospital Stay (HOSPITAL_COMMUNITY)
Admission: EM | Admit: 2019-05-28 | Discharge: 2019-06-25 | DRG: 064 | Disposition: A | Discharge status: Skilled Nursing Facility | Payer: Medicare Other | Attending: Internal Medicine | Admitting: Internal Medicine

## 2019-05-28 ENCOUNTER — Encounter (HOSPITAL_COMMUNITY): Payer: Self-pay | Admitting: Emergency Medicine

## 2019-05-28 ENCOUNTER — Inpatient Hospital Stay (HOSPITAL_COMMUNITY): Payer: Medicare Other

## 2019-05-28 ENCOUNTER — Emergency Department (HOSPITAL_COMMUNITY): Payer: Medicare Other

## 2019-05-28 DIAGNOSIS — R41841 Cognitive communication deficit: Secondary | ICD-10-CM | POA: Diagnosis not present

## 2019-05-28 DIAGNOSIS — B9689 Other specified bacterial agents as the cause of diseases classified elsewhere: Secondary | ICD-10-CM | POA: Diagnosis not present

## 2019-05-28 DIAGNOSIS — I6389 Other cerebral infarction: Secondary | ICD-10-CM | POA: Diagnosis not present

## 2019-05-28 DIAGNOSIS — R000 Tachycardia, unspecified: Secondary | ICD-10-CM

## 2019-05-28 DIAGNOSIS — Z8249 Family history of ischemic heart disease and other diseases of the circulatory system: Secondary | ICD-10-CM

## 2019-05-28 DIAGNOSIS — Z20822 Contact with and (suspected) exposure to covid-19: Secondary | ICD-10-CM | POA: Diagnosis present

## 2019-05-28 DIAGNOSIS — I6381 Other cerebral infarction due to occlusion or stenosis of small artery: Secondary | ICD-10-CM | POA: Diagnosis not present

## 2019-05-28 DIAGNOSIS — G9341 Metabolic encephalopathy: Secondary | ICD-10-CM | POA: Diagnosis not present

## 2019-05-28 DIAGNOSIS — R0602 Shortness of breath: Secondary | ICD-10-CM | POA: Diagnosis not present

## 2019-05-28 DIAGNOSIS — E872 Acidosis: Secondary | ICD-10-CM | POA: Diagnosis not present

## 2019-05-28 DIAGNOSIS — M6281 Muscle weakness (generalized): Secondary | ICD-10-CM | POA: Diagnosis not present

## 2019-05-28 DIAGNOSIS — R270 Ataxia, unspecified: Secondary | ICD-10-CM | POA: Diagnosis not present

## 2019-05-28 DIAGNOSIS — M2550 Pain in unspecified joint: Secondary | ICD-10-CM | POA: Diagnosis not present

## 2019-05-28 DIAGNOSIS — I69359 Hemiplegia and hemiparesis following cerebral infarction affecting unspecified side: Secondary | ICD-10-CM

## 2019-05-28 DIAGNOSIS — N390 Urinary tract infection, site not specified: Secondary | ICD-10-CM | POA: Diagnosis not present

## 2019-05-28 DIAGNOSIS — Z7901 Long term (current) use of anticoagulants: Secondary | ICD-10-CM

## 2019-05-28 DIAGNOSIS — R296 Repeated falls: Secondary | ICD-10-CM | POA: Diagnosis present

## 2019-05-28 DIAGNOSIS — Z7189 Other specified counseling: Secondary | ICD-10-CM

## 2019-05-28 DIAGNOSIS — Z79899 Other long term (current) drug therapy: Secondary | ICD-10-CM

## 2019-05-28 DIAGNOSIS — H9193 Unspecified hearing loss, bilateral: Secondary | ICD-10-CM | POA: Diagnosis present

## 2019-05-28 DIAGNOSIS — Z7982 Long term (current) use of aspirin: Secondary | ICD-10-CM

## 2019-05-28 DIAGNOSIS — I69351 Hemiplegia and hemiparesis following cerebral infarction affecting right dominant side: Secondary | ICD-10-CM | POA: Diagnosis not present

## 2019-05-28 DIAGNOSIS — R404 Transient alteration of awareness: Secondary | ICD-10-CM | POA: Diagnosis not present

## 2019-05-28 DIAGNOSIS — R930 Abnormal findings on diagnostic imaging of skull and head, not elsewhere classified: Secondary | ICD-10-CM | POA: Diagnosis present

## 2019-05-28 DIAGNOSIS — I63233 Cerebral infarction due to unspecified occlusion or stenosis of bilateral carotid arteries: Secondary | ICD-10-CM | POA: Diagnosis not present

## 2019-05-28 DIAGNOSIS — N200 Calculus of kidney: Secondary | ICD-10-CM | POA: Diagnosis not present

## 2019-05-28 DIAGNOSIS — I2510 Atherosclerotic heart disease of native coronary artery without angina pectoris: Secondary | ICD-10-CM | POA: Diagnosis not present

## 2019-05-28 DIAGNOSIS — I639 Cerebral infarction, unspecified: Secondary | ICD-10-CM | POA: Diagnosis not present

## 2019-05-28 DIAGNOSIS — R2689 Other abnormalities of gait and mobility: Secondary | ICD-10-CM | POA: Diagnosis not present

## 2019-05-28 DIAGNOSIS — R4781 Slurred speech: Secondary | ICD-10-CM | POA: Diagnosis not present

## 2019-05-28 DIAGNOSIS — Z888 Allergy status to other drugs, medicaments and biological substances status: Secondary | ICD-10-CM

## 2019-05-28 DIAGNOSIS — I69391 Dysphagia following cerebral infarction: Secondary | ICD-10-CM | POA: Diagnosis not present

## 2019-05-28 DIAGNOSIS — I119 Hypertensive heart disease without heart failure: Secondary | ICD-10-CM | POA: Diagnosis present

## 2019-05-28 DIAGNOSIS — Z83438 Family history of other disorder of lipoprotein metabolism and other lipidemia: Secondary | ICD-10-CM

## 2019-05-28 DIAGNOSIS — R482 Apraxia: Secondary | ICD-10-CM | POA: Diagnosis not present

## 2019-05-28 DIAGNOSIS — E43 Unspecified severe protein-calorie malnutrition: Secondary | ICD-10-CM | POA: Diagnosis not present

## 2019-05-28 DIAGNOSIS — I739 Peripheral vascular disease, unspecified: Secondary | ICD-10-CM | POA: Diagnosis present

## 2019-05-28 DIAGNOSIS — E785 Hyperlipidemia, unspecified: Secondary | ICD-10-CM | POA: Diagnosis present

## 2019-05-28 DIAGNOSIS — I6340 Cerebral infarction due to embolism of unspecified cerebral artery: Secondary | ICD-10-CM | POA: Diagnosis not present

## 2019-05-28 DIAGNOSIS — R079 Chest pain, unspecified: Secondary | ICD-10-CM | POA: Diagnosis not present

## 2019-05-28 DIAGNOSIS — I82491 Acute embolism and thrombosis of other specified deep vein of right lower extremity: Secondary | ICD-10-CM | POA: Diagnosis not present

## 2019-05-28 DIAGNOSIS — F0150 Vascular dementia without behavioral disturbance: Secondary | ICD-10-CM | POA: Diagnosis not present

## 2019-05-28 DIAGNOSIS — R471 Dysarthria and anarthria: Secondary | ICD-10-CM | POA: Diagnosis present

## 2019-05-28 DIAGNOSIS — I6523 Occlusion and stenosis of bilateral carotid arteries: Secondary | ICD-10-CM | POA: Diagnosis present

## 2019-05-28 DIAGNOSIS — B965 Pseudomonas (aeruginosa) (mallei) (pseudomallei) as the cause of diseases classified elsewhere: Secondary | ICD-10-CM | POA: Diagnosis not present

## 2019-05-28 DIAGNOSIS — R2681 Unsteadiness on feet: Secondary | ICD-10-CM | POA: Diagnosis present

## 2019-05-28 DIAGNOSIS — R29810 Facial weakness: Secondary | ICD-10-CM | POA: Diagnosis present

## 2019-05-28 DIAGNOSIS — J439 Emphysema, unspecified: Secondary | ICD-10-CM | POA: Diagnosis present

## 2019-05-28 DIAGNOSIS — I10 Essential (primary) hypertension: Secondary | ICD-10-CM | POA: Diagnosis not present

## 2019-05-28 DIAGNOSIS — I442 Atrioventricular block, complete: Secondary | ICD-10-CM | POA: Diagnosis present

## 2019-05-28 DIAGNOSIS — E860 Dehydration: Secondary | ICD-10-CM | POA: Diagnosis present

## 2019-05-28 DIAGNOSIS — Z9181 History of falling: Secondary | ICD-10-CM | POA: Diagnosis not present

## 2019-05-28 DIAGNOSIS — K760 Fatty (change of) liver, not elsewhere classified: Secondary | ICD-10-CM | POA: Diagnosis present

## 2019-05-28 DIAGNOSIS — Z950 Presence of cardiac pacemaker: Secondary | ICD-10-CM

## 2019-05-28 DIAGNOSIS — Z515 Encounter for palliative care: Secondary | ICD-10-CM | POA: Diagnosis not present

## 2019-05-28 DIAGNOSIS — R29704 NIHSS score 4: Secondary | ICD-10-CM | POA: Diagnosis present

## 2019-05-28 DIAGNOSIS — R509 Fever, unspecified: Secondary | ICD-10-CM | POA: Diagnosis not present

## 2019-05-28 DIAGNOSIS — Z86718 Personal history of other venous thrombosis and embolism: Secondary | ICD-10-CM

## 2019-05-28 DIAGNOSIS — I493 Ventricular premature depolarization: Secondary | ICD-10-CM

## 2019-05-28 DIAGNOSIS — D72829 Elevated white blood cell count, unspecified: Secondary | ICD-10-CM | POA: Diagnosis not present

## 2019-05-28 DIAGNOSIS — I82511 Chronic embolism and thrombosis of right femoral vein: Secondary | ICD-10-CM | POA: Diagnosis present

## 2019-05-28 DIAGNOSIS — F0390 Unspecified dementia without behavioral disturbance: Secondary | ICD-10-CM | POA: Diagnosis not present

## 2019-05-28 DIAGNOSIS — R0902 Hypoxemia: Secondary | ICD-10-CM | POA: Diagnosis not present

## 2019-05-28 DIAGNOSIS — S3992XA Unspecified injury of lower back, initial encounter: Secondary | ICD-10-CM | POA: Diagnosis not present

## 2019-05-28 DIAGNOSIS — J189 Pneumonia, unspecified organism: Secondary | ICD-10-CM | POA: Diagnosis not present

## 2019-05-28 DIAGNOSIS — Z4682 Encounter for fitting and adjustment of non-vascular catheter: Secondary | ICD-10-CM | POA: Diagnosis not present

## 2019-05-28 DIAGNOSIS — R531 Weakness: Secondary | ICD-10-CM | POA: Diagnosis not present

## 2019-05-28 DIAGNOSIS — E440 Moderate protein-calorie malnutrition: Secondary | ICD-10-CM | POA: Diagnosis not present

## 2019-05-28 DIAGNOSIS — R1312 Dysphagia, oropharyngeal phase: Secondary | ICD-10-CM | POA: Diagnosis present

## 2019-05-28 DIAGNOSIS — R1310 Dysphagia, unspecified: Secondary | ICD-10-CM | POA: Diagnosis not present

## 2019-05-28 DIAGNOSIS — G8191 Hemiplegia, unspecified affecting right dominant side: Secondary | ICD-10-CM | POA: Diagnosis not present

## 2019-05-28 DIAGNOSIS — I700 Atherosclerosis of aorta: Secondary | ICD-10-CM | POA: Diagnosis present

## 2019-05-28 DIAGNOSIS — N400 Enlarged prostate without lower urinary tract symptoms: Secondary | ICD-10-CM | POA: Diagnosis present

## 2019-05-28 DIAGNOSIS — R4182 Altered mental status, unspecified: Secondary | ICD-10-CM | POA: Diagnosis not present

## 2019-05-28 DIAGNOSIS — R1311 Dysphagia, oral phase: Secondary | ICD-10-CM | POA: Diagnosis present

## 2019-05-28 DIAGNOSIS — Z66 Do not resuscitate: Secondary | ICD-10-CM | POA: Diagnosis not present

## 2019-05-28 DIAGNOSIS — R52 Pain, unspecified: Secondary | ICD-10-CM | POA: Diagnosis not present

## 2019-05-28 DIAGNOSIS — Z85828 Personal history of other malignant neoplasm of skin: Secondary | ICD-10-CM

## 2019-05-28 DIAGNOSIS — R05 Cough: Secondary | ICD-10-CM | POA: Diagnosis not present

## 2019-05-28 DIAGNOSIS — K219 Gastro-esophageal reflux disease without esophagitis: Secondary | ICD-10-CM | POA: Diagnosis present

## 2019-05-28 DIAGNOSIS — R278 Other lack of coordination: Secondary | ICD-10-CM | POA: Diagnosis not present

## 2019-05-28 DIAGNOSIS — I82409 Acute embolism and thrombosis of unspecified deep veins of unspecified lower extremity: Secondary | ICD-10-CM

## 2019-05-28 DIAGNOSIS — K9420 Gastrostomy complication, unspecified: Secondary | ICD-10-CM

## 2019-05-28 DIAGNOSIS — G4700 Insomnia, unspecified: Secondary | ICD-10-CM | POA: Diagnosis not present

## 2019-05-28 DIAGNOSIS — Z8616 Personal history of COVID-19: Secondary | ICD-10-CM | POA: Diagnosis not present

## 2019-05-28 DIAGNOSIS — E7800 Pure hypercholesterolemia, unspecified: Secondary | ICD-10-CM | POA: Diagnosis not present

## 2019-05-28 DIAGNOSIS — S301XXA Contusion of abdominal wall, initial encounter: Secondary | ICD-10-CM | POA: Diagnosis not present

## 2019-05-28 DIAGNOSIS — R682 Dry mouth, unspecified: Secondary | ICD-10-CM | POA: Diagnosis not present

## 2019-05-28 DIAGNOSIS — J690 Pneumonitis due to inhalation of food and vomit: Secondary | ICD-10-CM | POA: Diagnosis not present

## 2019-05-28 DIAGNOSIS — Z7401 Bed confinement status: Secondary | ICD-10-CM | POA: Diagnosis not present

## 2019-05-28 DIAGNOSIS — I69390 Apraxia following cerebral infarction: Secondary | ICD-10-CM | POA: Diagnosis not present

## 2019-05-28 DIAGNOSIS — Z823 Family history of stroke: Secondary | ICD-10-CM

## 2019-05-28 HISTORY — DX: COVID-19: U07.1

## 2019-05-28 LAB — URINALYSIS, ROUTINE W REFLEX MICROSCOPIC
Bilirubin Urine: NEGATIVE
Glucose, UA: NEGATIVE mg/dL
Hgb urine dipstick: NEGATIVE
Ketones, ur: NEGATIVE mg/dL
Leukocytes,Ua: NEGATIVE
Nitrite: NEGATIVE
Protein, ur: NEGATIVE mg/dL
Specific Gravity, Urine: 1.018 (ref 1.005–1.030)
pH: 6 (ref 5.0–8.0)

## 2019-05-28 LAB — CBC
HCT: 45.9 % (ref 39.0–52.0)
Hemoglobin: 15.2 g/dL (ref 13.0–17.0)
MCH: 34.5 pg — ABNORMAL HIGH (ref 26.0–34.0)
MCHC: 33.1 g/dL (ref 30.0–36.0)
MCV: 104.1 fL — ABNORMAL HIGH (ref 80.0–100.0)
Platelets: 174 10*3/uL (ref 150–400)
RBC: 4.41 MIL/uL (ref 4.22–5.81)
RDW: 13.8 % (ref 11.5–15.5)
WBC: 9.1 10*3/uL (ref 4.0–10.5)
nRBC: 0 % (ref 0.0–0.2)

## 2019-05-28 LAB — DIFFERENTIAL
Abs Immature Granulocytes: 0.03 10*3/uL (ref 0.00–0.07)
Basophils Absolute: 0 10*3/uL (ref 0.0–0.1)
Basophils Relative: 0 %
Eosinophils Absolute: 0 10*3/uL (ref 0.0–0.5)
Eosinophils Relative: 0 %
Immature Granulocytes: 0 %
Lymphocytes Relative: 10 %
Lymphs Abs: 0.9 10*3/uL (ref 0.7–4.0)
Monocytes Absolute: 0.7 10*3/uL (ref 0.1–1.0)
Monocytes Relative: 8 %
Neutro Abs: 7.3 10*3/uL (ref 1.7–7.7)
Neutrophils Relative %: 82 %

## 2019-05-28 LAB — COMPREHENSIVE METABOLIC PANEL
ALT: 25 U/L (ref 0–44)
AST: 32 U/L (ref 15–41)
Albumin: 3.4 g/dL — ABNORMAL LOW (ref 3.5–5.0)
Alkaline Phosphatase: 74 U/L (ref 38–126)
Anion gap: 8 (ref 5–15)
BUN: 14 mg/dL (ref 8–23)
CO2: 22 mmol/L (ref 22–32)
Calcium: 8.7 mg/dL — ABNORMAL LOW (ref 8.9–10.3)
Chloride: 111 mmol/L (ref 98–111)
Creatinine, Ser: 0.85 mg/dL (ref 0.61–1.24)
GFR calc Af Amer: >60 mL/min (ref >60)
GFR calc non Af Amer: >60 mL/min (ref >60)
Glucose, Bld: 104 mg/dL — ABNORMAL HIGH (ref 70–99)
Potassium: 3.9 mmol/L (ref 3.5–5.1)
Sodium: 141 mmol/L (ref 135–145)
Total Bilirubin: 1.4 mg/dL — ABNORMAL HIGH (ref 0.3–1.2)
Total Protein: 6.1 g/dL — ABNORMAL LOW (ref 6.5–8.1)

## 2019-05-28 LAB — I-STAT CHEM 8, ED
BUN: 15 mg/dL (ref 8–23)
Calcium, Ion: 1.03 mmol/L — ABNORMAL LOW (ref 1.15–1.40)
Chloride: 110 mmol/L (ref 98–111)
Creatinine, Ser: 0.7 mg/dL (ref 0.61–1.24)
Glucose, Bld: 98 mg/dL (ref 70–99)
HCT: 44 % (ref 39.0–52.0)
Hemoglobin: 15 g/dL (ref 13.0–17.0)
Potassium: 3.8 mmol/L (ref 3.5–5.1)
Sodium: 142 mmol/L (ref 135–145)
TCO2: 26 mmol/L (ref 22–32)

## 2019-05-28 LAB — CBG MONITORING, ED: Glucose-Capillary: 97 mg/dL (ref 70–99)

## 2019-05-28 LAB — RAPID URINE DRUG SCREEN, HOSP PERFORMED
Amphetamines: NOT DETECTED
Barbiturates: NOT DETECTED
Benzodiazepines: NOT DETECTED
Cocaine: NOT DETECTED
Opiates: NOT DETECTED
Tetrahydrocannabinol: NOT DETECTED

## 2019-05-28 LAB — HEPARIN LEVEL (UNFRACTIONATED): Heparin Unfractionated: 0.89 IU/mL — ABNORMAL HIGH (ref 0.30–0.70)

## 2019-05-28 LAB — PROTIME-INR
INR: 1.1 (ref 0.8–1.2)
Prothrombin Time: 14.5 seconds (ref 11.4–15.2)

## 2019-05-28 LAB — ETHANOL: Alcohol, Ethyl (B): <10 mg/dL (ref <10)

## 2019-05-28 LAB — SARS CORONAVIRUS 2 (TAT 6-24 HRS): SARS Coronavirus 2: NEGATIVE

## 2019-05-28 LAB — APTT
aPTT: 31 seconds (ref 24–36)
aPTT: 30 seconds (ref 24–36)

## 2019-05-28 MED ORDER — SIMVASTATIN 20 MG PO TABS: 40.0000 mg | ORAL_TABLET | Freq: Every day | ORAL | Status: DC

## 2019-05-28 MED ORDER — ACETAMINOPHEN 650 MG RE SUPP: 650.0000 mg | RECTAL | Status: DC | PRN

## 2019-05-28 MED ORDER — HEPARIN (PORCINE) 25000 UT/250ML-% IV SOLN
1150.0000 [IU]/h | INTRAVENOUS | Status: AC
  Administered 2019-05-28: 1050 [IU]/h via INTRAVENOUS
  Administered 2019-05-29 – 2019-05-30 (×2): 1150 [IU]/h via INTRAVENOUS
  Filled 2019-05-28 (×3): qty 250

## 2019-05-28 MED ORDER — ASPIRIN 300 MG RE SUPP
300.0000 mg | Freq: Every day | RECTAL | Status: DC
  Administered 2019-05-28 – 2019-05-30 (×3): 300 mg via RECTAL
  Filled 2019-05-28 (×3): qty 1

## 2019-05-28 MED ORDER — ACETAMINOPHEN 325 MG PO TABS: 650.0000 mg | ORAL_TABLET | ORAL | Status: DC | PRN

## 2019-05-28 MED ORDER — STROKE: EARLY STAGES OF RECOVERY BOOK
Freq: Once | Status: AC
  Filled 2019-05-28: qty 1

## 2019-05-28 MED ORDER — ACETAMINOPHEN 160 MG/5ML PO SOLN
650.0000 mg | ORAL | Status: DC | PRN
  Administered 2019-06-12 – 2019-06-13 (×2): 650 mg
  Filled 2019-05-28 (×2): qty 20.3

## 2019-05-28 MED ORDER — IOHEXOL 350 MG/ML SOLN
50.0000 mL | Freq: Once | INTRAVENOUS | Status: AC | PRN
  Administered 2019-05-28: 21:00:00 100 mL via INTRAVENOUS

## 2019-05-28 MED ORDER — MINERAL OIL RE ENEM
1.0000 | ENEMA | Freq: Two times a day (BID) | RECTAL | Status: DC | PRN
  Filled 2019-05-28 (×2): qty 1

## 2019-05-28 MED ORDER — SODIUM CHLORIDE 0.9 % IV SOLN: INTRAVENOUS | Status: AC

## 2019-05-28 MED ORDER — LABETALOL HCL 5 MG/ML IV SOLN: 5.0000 mg | INTRAVENOUS | Status: DC | PRN

## 2019-05-28 MED ORDER — TAMSULOSIN HCL 0.4 MG PO CAPS
0.4000 mg | ORAL_CAPSULE | Freq: Every day | ORAL | Status: DC
  Filled 2019-05-28: qty 1

## 2019-05-28 NOTE — ED Notes (Signed)
Pt transported to xray 

## 2019-05-28 NOTE — Consult Note (Addendum)
Neurology Consultation  Reason for Consult: MRI showing bilateral strokes Referring Physician: Virgel Manifold  CC: Dysarthria, right leg weakness, abnormal MRI  History is obtained from: Wife  HPI: William Mann is a 86 y.o. male with past medical history of right carotid stenosis with right carotid endarterectomy in the past, PVD, hypertension, hyperlipidemia, lower extremity DVT on Eliquis, carotid artery occlusion, atherosclerosis and bilateral hearing loss.  Patient return to the emergency department today secondary to family noting significant dysarthria and facial droop.  MRI returned showing bilateral infarcts.  Neurology was asked to consult.  As noted below patient was seen in the hospital yesterday for 2 falls however at that time did not appear to have any localizing or lateralizing abnormalities to warrant an MRI.  Per wife patient was noted today to have significantly more weakness in the right leg, significant dysarthria and facial droop.  Currently patient is coughing and has clear gurgling when breathing.  He has significant dysarthria to the point where at times it is hard to understand him.  It should be noted that patient did have Covid-19 infection approximately 3 months ago, and recovered completely.  Chart review-no neurological notes.  Patient was seen in the ED yesterday secondary to having 2 distinct falls.  His falls are secondary to feeling unsteady with his gait.  He did strike the back of his head got up and shortly fell again striking his forehead.  CT did not show any intracranial bleed.  He did note at that time that his right leg was giving out.  At that time practitioner did not see any neurological signs such as dysarthria facial droop thus patient was sent home after a normal head CT and reassuring exam.  Work up that has been done: CT head x2 MRI brain  LKW: 2 days ago tpa given?: no, out of window Premorbid modified Rankin scale (mRS): 0 NIH stroke  scale of 4  Past Medical History:  Diagnosis Date  . Aortic atherosclerosis ([place])   . Bilateral hearing loss   . BPH (benign prostatic hyperplasia)   . Cancer ([place])    basal cell carcinoma  . Carotid artery occlusion   . Constipation   . Coronary artery disease   . DVT (deep venous thrombosis) ([place])   . Emphysema lung ([place])   . Fatty liver   . GERD (gastroesophageal reflux disease)   . History of echocardiogram    Echo 4/17: Vigorous LVEF, EF 65-70%, normal wall motion, grade 1 diastolic dysfunction, mildly dilated ascending aorta (40 mm)  . Hyperlipidemia   . Hypertension   . Insomnia   . PVD (peripheral vascular disease) ([place])   . Thyroid nodule      Family History  Problem Relation Age of Onset  . Heart disease Sister   . Cancer Sister   . Hypertension Sister   . Heart attack Sister   . Heart disease Brother   . Stroke Brother   . Hypertension Brother   . Heart disease Brother   . Hyperlipidemia Son    Social History:   reports that he has never smoked. He has never used smokeless tobacco. He reports that he does not drink alcohol or use drugs.  Medications No current facility-administered medications for this encounter.  Current Outpatient Medications:  .  apixaban (ELIQUIS) 5 MG TABS tablet, Take 1 tablet (5 mg total) by mouth 2 (two) times daily. Resume 12/27/18 evening dose, Disp: 60 tablet, Rfl: 1 .  aspirin EC 81 MG  tablet, Take 81 mg by mouth daily. , Disp: , Rfl:  .  Calcium Carbonate (CALTRATE 600 PO), Take 1 tablet by mouth daily. , Disp: , Rfl:  .  Cholecalciferol (VITAMIN D-3 PO), Take 1 tablet by mouth daily., Disp: , Rfl:  .  Coenzyme Q10 (COQ-10) 100 MG CAPS, Take 1 capsule by mouth daily. , Disp: , Rfl:  .  cyanocobalamin 100 MCG tablet, Take 100 mcg by mouth daily., Disp: , Rfl:  .  ELIQUIS 5 MG TABS tablet, Take 1 tablet by mouth in the morning and at bedtime., Disp: , Rfl:  .  Melatonin 5 MG TABS, Take 1 tablet by mouth at bedtime as needed.,  Disp: , Rfl:  .  Multiple Vitamin (MULTIVITAMIN) tablet, Take 1 tablet by mouth daily., Disp: , Rfl:  .  senna (SENOKOT) 8.6 MG tablet, Take 1 tablet by mouth 2 (two) times daily as needed for constipation., Disp: , Rfl:  .  simvastatin (ZOCOR) 10 MG tablet, Take 10 mg by mouth daily., Disp: , Rfl:  .  simvastatin (ZOCOR) 10 MG tablet, Take 10 mg by mouth at bedtime., Disp: , Rfl:  .  tamsulosin (FLOMAX) 0.4 MG CAPS capsule, Take 0.4 mg by mouth daily., Disp: , Rfl:  .  tamsulosin (FLOMAX) 0.4 MG CAPS capsule, Take 0.4 mg by mouth daily., Disp: , Rfl:  .  zolpidem (AMBIEN) 5 MG tablet, Take 5 mg by mouth at bedtime as needed for sleep., Disp: , Rfl:  .  zolpidem (AMBIEN) 5 MG tablet, Take 5 mg by mouth at bedtime as needed., Disp: , Rfl:   ROS:   General ROS: negative for - chills, fatigue, fever, night sweats, weight gain or weight loss Psychological ROS: negative for - behavioral disorder, hallucinations, memory difficulties, mood swings or suicidal ideation Ophthalmic ROS: negative for - blurry vision, double vision, eye pain or loss of vision ENT ROS: negative for - epistaxis, nasal discharge, oral lesions, sore throat, tinnitus or vertigo Respiratory ROS: Positive for - cough Cardiovascular ROS: negative for - chest pain, dyspnea on exertion, edema or irregular heartbeat Gastrointestinal ROS: negative for - abdominal pain, diarrhea, hematemesis, nausea/vomiting or stool incontinence Genito-Urinary ROS: negative for - dysuria, hematuria, incontinence or urinary frequency/urgency Musculoskeletal ROS: Positive for -  muscular weakness Neurological ROS: as noted in HPI Dermatological ROS: negative for rash and skin lesion changes  Exam: Current vital signs: BP (!) 167/100   Pulse 84   Temp 98.6 F (37 C) (Oral)   Resp 18   SpO2 94%  Vital signs in last 24 hours: Temp:  [97.6 F (36.4 C)-98.6 F (37 C)] 98.6 F (37 C) (04/21 1153) Pulse Rate:  [71-90] 84 (04/21 1550) Resp:   [13-26] 18 (04/21 1550) BP: (131-167)/(75-100) 167/100 (04/21 1550) SpO2:  [94 %-100 %] 94 % (04/21 1550)   Constitutional: Appears well-developed and well-nourished.  Psych: Affect appropriate to situation Eyes: No scleral injection HENT: No OP obstrucion Head: Normocephalic.  Cardiovascular: Normal rate and regular rhythm.  Respiratory: Effort normal, non-labored breathing GI: Soft.  No distension. There is no tenderness.  Skin: WDI Neuro: Mental Status: Patient is awake, alert, oriented to person, place Speech-intact T naming, comprehension, very dysarthric Able to follow commands without difficulty Cranial Nerves: II: Visual Fields are full.  III,IV, VI: EOMI without ptosis or diploplia. Pupils equal, round and reactive to light V: Facial sensation is symmetric to temperature VII: Right lower facial facial droop VIII: Decreased hearing X: Palat elevates symmetrically XI: Shoulder  shrug is symmetric. XII: tongue is midline without atrophy or fasciculations.  Motor: Tone is normal. Bulk is normal. 5/5 strength was present in all four extremities.  No drift Sensory: Sensation is symmetric to light touch and temperature in the arms and legs. Deep Tendon Reflexes: 2+ and symmetric in the biceps and patellae.  Plantars: Toes are downgoing bilaterally.  Cerebellar: FNF and HKS are intact bilaterally  Labs I have reviewed labs in epic and the results pertinent to this consultation are: CBC    Component Value Date/Time   WBC 9.1 05/28/2019 1154   RBC 4.41 05/28/2019 1154   HGB 15.0 05/28/2019 1216   HCT 44.0 05/28/2019 1216   PLT 174 05/28/2019 1154   MCV 104.1 (H) 05/28/2019 1154   MCH 34.5 (H) 05/28/2019 1154   MCHC 33.1 05/28/2019 1154   RDW 13.8 05/28/2019 1154   LYMPHSABS 0.9 05/28/2019 1154   MONOABS 0.7 05/28/2019 1154   EOSABS 0.0 05/28/2019 1154   BASOSABS 0.0 05/28/2019 1154    CMP     Component Value Date/Time   NA 142 05/28/2019 1216   K 3.8  05/28/2019 1216   CL 110 05/28/2019 1216   CO2 22 05/28/2019 1154   GLUCOSE 98 05/28/2019 1216   BUN 15 05/28/2019 1216   CREATININE 0.70 05/28/2019 1216   CALCIUM 8.7 (L) 05/28/2019 1154   PROT 6.1 (L) 05/28/2019 1154   ALBUMIN 3.4 (L) 05/28/2019 1154   AST 32 05/28/2019 1154   ALT 25 05/28/2019 1154   ALKPHOS 74 05/28/2019 1154   BILITOT 1.4 (H) 05/28/2019 1154   GFRNONAA >60 05/28/2019 1154   GFRAA >60 05/28/2019 1154     Imaging I have reviewed the images obtained: CT-scan of the brain-no acute intracranial findings. MRI examination of the brain-small acute infarcts in the left basal ganglia/corona radiata and right thalamus Chest x-ray shows no acute cardiopulmonary disease  Etta Quill PA-C [facility] [phone]  M-F  (9:00 am- 5:00 PM)  05/28/2019, 4:59 PM   Assessment:  This is an 86 year old male presenting with 2-day history of right leg weakness which included 2 falls yesterday with worsening symptoms today of right facial droop and significant dysarthria.  MRI obtained revealed small acute infarcts in left basal ganglia/corona radiata and right thalamus.  Impression: -Acute ischemic stroke-likely concomitant small vessel disease but given the history of a DVT, although anticoagulated, should also evaluate for embolic phenomenology. -Severe dysarthria, likely related to the stroke.   Recommend -CTA head neck -Transthoracic Echo  -Continue Eliquis -Start or continue Atorvastatin 80 mg/other high intensity statin -BP goal: Allow for permissive hypertension for another day or so and then start normalizing.  For permissive hypertension do not treat unless systolic blood pressure goes above 220. -HBAIC and Lipid profile -Telemetry monitoring -Frequent neuro checks -NPO until passes stroke swallow screen -PT/OT/speech therapy -N.p.o. until seen by speech therapy -Bilateral lower extremity Dopplers # please page stroke NP  Or  PA  Or MD from 8am  -4 pm  as this patient from this time will be  followed by the stroke.   You can look them up on [email or web address]  [place]    Attending Neurohospitalist Addendum Patient seen and examined with APP/Resident. Agree with the history and physical as documented above. Agree with the plan as documented, which I helped formulate. I have independently reviewed the chart, obtained history, review of systems and examined the patient.I have personally reviewed pertinent head/neck/spine imaging (CT/MRI). Please feel free to call with  any questions. --- Amie Portland, MD [facility] Pager: [phone]  If 7pm to 7am, please call on call as listed on AMION.

## 2019-05-28 NOTE — Progress Notes (Signed)
[place] for Heparin Indication: VTE Treatment on Apixaban PTA   Allergies  Allergen Reactions  . Atorvastatin Other (See Comments)    Liver inflammation    . Rosuvastatin Other (See Comments)    Muscle pain  . Viagra [Sildenafil] Other (See Comments)    Reaction ??    Patient Measurements:   Heparin Dosing Weight: 68 kg  Vital Signs: Temp: 98.6 F (37 C) (04/21 1153) Temp Source: Oral (04/21 1153) BP: 156/101 (04/21 1900) Pulse Rate: 94 (04/21 1900)  Labs: Recent Labs    05/27/19 1629 05/27/19 1629 05/28/19 1154 05/28/19 1216  HGB 14.3   < > 15.2 15.0  HCT 43.6  --  45.9 44.0  PLT 160  --  174  --   APTT  --   --  31  --   LABPROT 14.6  --  14.5  --   INR 1.1  --  1.1  --   CREATININE 0.98  --  0.85 0.70   < > = values in this interval not displayed.    Estimated Creatinine Clearance: 61.4 mL/min (by C-G formula based on SCr of 0.7 mg/dL).   Medical History: Past Medical History:  Diagnosis Date  . Aortic atherosclerosis ([place])   . Bilateral hearing loss   . BPH (benign prostatic hyperplasia)   . Cancer ([place])    basal cell carcinoma  . Carotid artery occlusion   . Constipation   . Coronary artery disease   . DVT (deep venous thrombosis) ([place])   . Emphysema lung ([place])   . Fatty liver   . GERD (gastroesophageal reflux disease)   . History of echocardiogram    Echo 4/17: Vigorous LVEF, EF 65-70%, normal wall motion, grade 1 diastolic dysfunction, mildly dilated ascending aorta (40 mm)  . Hyperlipidemia   . Hypertension   . Insomnia   . PVD (peripheral vascular disease) ([place])   . Thyroid nodule     Medications:  Scheduled:  .  stroke: mapping our early stages of recovery book   Does not apply Once  . aspirin  300 mg Rectal Daily  . simvastatin  40 mg Oral QHS  . tamsulosin  0.4 mg Oral Daily    Assessment: Patient is a 79 yom that presents to the ED after an abnormal MRI that showed small acute infarcts  in the left basal ganglia. Patient was on Apixaban PTA for recent DVT. Neuro recommended to continue anticoagulation. The admitting team has requested that pharmacy dose Heparin at this time in place of the patient's home Apixaban.   Goal of Therapy:  Heparin level 0.3-0.7 units/ml Monitor platelets by anticoagulation protocol: Yes   Plan:  - On Apixaban PTA, last dose was 4/20 @ ~ 0900  - Start Heparin drip @ 1050 units/hr - Will monitor heparin using aPTT until aPTT and Heparin levels correlate.  - Will obtain aPTT and HL in ~ 6 hours  - Monitor patient for s/s of bleeding and CBC while on heparin   Duanne Limerick PharmD. BCPS  05/28/2019,7:13 PM

## 2019-05-28 NOTE — ED Provider Notes (Signed)
[facility] EMERGENCY DEPARTMENT Provider Note   CSN: [id number] Arrival date & time: 05/28/19  1136   History Stroke symptoms  William Mann is a 86 y.o. male with past medical history significant for BPH, coronary artery occlusion , DVT on Eliquis, hypertension, PVD who presents for evaluation of strokelike symptoms. Apparently was seen yesterday for multiple falls. Had negative head and C-spine at that time. Was given IV fluids for possible dehydration.   Level 5 caveat-altered mental status   Majority information obtained from patient's grandson, Kaysten Waltermire.  States yesterday had 2-3 falls. These occurred when he possibly had lower extremity weakness. Both falls include hitting his head. Was here in the emergency department evaluated which had negative imaging. Sent home. When to call PCP this morning to schedule a follow-up when the nurse asked to speak with the patient. Nursing at PCP office noted slurred speech. Patient's grandson states he does have some mild slurred speech at baseline however today was significantly worse. They were unaware of facial droop. States EMS initially evaluated patient when he was laying on his right side and he had a facial droop however they were unsure if this was due to patient's position. No prior history of strokes per family.   Patient is full code  lkn yesterday evening unknown time  PCP- Phillis Knack  HPI     Past Medical History:  Diagnosis Date  . Aortic atherosclerosis ([place])   . Bilateral hearing loss   . BPH (benign prostatic hyperplasia)   . Cancer ([place])    basal cell carcinoma  . Carotid artery occlusion   . Constipation   . Coronary artery disease   . DVT (deep venous thrombosis) ([place])   . Emphysema lung ([place])   . Fatty liver   . GERD (gastroesophageal reflux disease)   . History of echocardiogram    Echo 4/17: Vigorous LVEF, EF 65-70%, normal wall motion, grade 1 diastolic dysfunction, mildly  dilated ascending aorta (40 mm)  . Hyperlipidemia   . Hypertension   . Insomnia   . PVD (peripheral vascular disease) ([place])   . Thyroid nodule     Patient Active Problem List   Diagnosis Date Noted  . Pacemaker 03/24/2019  . Complete heart block ([place]) 12/20/2018  . Aftercare following surgery of the circulatory system 02/17/2014  . Bilateral carotid artery occlusion 02/17/2014  . Aftercare following surgery of the circulatory system, [place] 01/17/2013  . Numbness and tingling 02/27/2012  . Peripheral vascular disease, unspecified ([place]) 02/27/2012  . Occlusion and stenosis of carotid artery without mention of cerebral infarction 03/14/2011    Past Surgical History:  Procedure Laterality Date  . APPENDECTOMY    . CAROTID ENDARTERECTOMY  03-05-09   right CEA  . COLONOSCOPY  Oct. 2013   one polyp  . HERNIA REPAIR    . LIPOMA EXCISION    . PACEMAKER IMPLANT N/A 12/23/2018   Procedure: PACEMAKER IMPLANT;  Surgeon: Evans Lance, MD;  Location: [facility];  Service: Cardiovascular;  Laterality: N/A;       Family History  Problem Relation Age of Onset  . Heart disease Sister   . Cancer Sister   . Hypertension Sister   . Heart attack Sister   . Heart disease Brother   . Stroke Brother   . Hypertension Brother   . Heart disease Brother   . Hyperlipidemia Son     Social History   Tobacco Use  . Smoking status: Never Smoker  .  Smokeless tobacco: Never Used  Substance Use Topics  . Alcohol use: No  . Drug use: No    Home Medications Prior to Admission medications   Medication Sig Start Date End Date Taking? Authorizing Provider  apixaban (ELIQUIS) 5 MG TABS tablet Take 1 tablet (5 mg total) by mouth 2 (two) times daily. Resume 12/27/18 evening dose 12/24/18   Chanetta Marshall K, NP  aspirin EC 81 MG tablet Take 81 mg by mouth daily.     [provider]  Calcium Carbonate (CALTRATE 600 PO) Take 1 tablet by mouth daily.     [provider]    Cholecalciferol (VITAMIN D-3 PO) Take 1 tablet by mouth daily.    [provider]  Coenzyme Q10 (COQ-10) 100 MG CAPS Take 1 capsule by mouth daily.     [provider]  cyanocobalamin 100 MCG tablet Take 100 mcg by mouth daily.    [provider]  ELIQUIS 5 MG TABS tablet Take 1 tablet by mouth in the morning and at bedtime. 12/31/18   [provider]  Melatonin 5 MG TABS Take 1 tablet by mouth at bedtime as needed.    [provider]  Multiple Vitamin (MULTIVITAMIN) tablet Take 1 tablet by mouth daily.    [provider]  senna (SENOKOT) 8.6 MG tablet Take 1 tablet by mouth 2 (two) times daily as needed for constipation.    [provider]  simvastatin (ZOCOR) 10 MG tablet Take 10 mg by mouth daily.    [provider]  simvastatin (ZOCOR) 10 MG tablet Take 10 mg by mouth at bedtime. 05/07/19   [provider]  tamsulosin (FLOMAX) 0.4 MG CAPS capsule Take 0.4 mg by mouth daily.    [provider]  tamsulosin (FLOMAX) 0.4 MG CAPS capsule Take 0.4 mg by mouth daily. 05/07/19   [provider]  zolpidem (AMBIEN) 5 MG tablet Take 5 mg by mouth at bedtime as needed for sleep.    [provider]  zolpidem (AMBIEN) 5 MG tablet Take 5 mg by mouth at bedtime as needed. 12/30/18   [provider]    Allergies    Atorvastatin, Other, Rosuvastatin, and Viagra [sildenafil]  Review of Systems   Review of Systems  Unable to perform ROS: Mental status change    Physical Exam Updated Vital Signs BP (!) 155/91   Pulse 94   Temp 98.6 F (37 C) (Oral)   Resp (!) 24   SpO2 98%   Physical Exam Vitals and nursing note reviewed.  Constitutional:      Appearance: He is not toxic-appearing. Ill appearance: Chronically ill appearing.  HENT:     Head:   Eyes:     Comments: EOM intact  Neck:     Comments: No neck stiffness or neck rigidity Cardiovascular:     Rate and Rhythm: Normal  rate.     Pulses: Normal pulses.     Heart sounds: Normal heart sounds.  Chest:     Comments: Pacemaker to LU chest wall. No overlying skin changes to chest wall Neurological:     Comments: Right-sided facial droop Asymmetric smile on right Expressive aphasia Unable to follow commands for finger to nose on right 4/5 grip strength on right    ED Results / Procedures / Treatments   Labs (all labs ordered are listed, but only abnormal results are displayed) Labs Reviewed  CBC - Abnormal; Notable for the following components:      Result  Value   MCV 104.1 (*)    MCH 34.5 (*)    All other components within normal limits  COMPREHENSIVE METABOLIC PANEL - Abnormal; Notable for the following components:   Glucose, Bld 104 (*)    Calcium 8.7 (*)    Total Protein 6.1 (*)    Albumin 3.4 (*)    Total Bilirubin 1.4 (*)    All other components within normal limits  I-STAT CHEM 8, ED - Abnormal; Notable for the following components:   Calcium, Ion 1.03 (*)    All other components within normal limits  SARS CORONAVIRUS 2 (TAT 6-24 HRS)  ETHANOL  PROTIME-INR  APTT  DIFFERENTIAL  RAPID URINE DRUG SCREEN, HOSP PERFORMED  URINALYSIS, ROUTINE W REFLEX MICROSCOPIC  CBG MONITORING, ED    EKG EKG Interpretation  Date/Time:  Wednesday May 28 2019 12:00:33 EDT Ventricular Rate:  91 PR Interval:    QRS Duration: 152 QT Interval:  419 QTC Calculation: 516 R Axis:   -80 Text Interpretation: Atrial-sensed ventricular-paced rhythm Confirmed by Virgel Manifold [phone]) on 05/28/2019 12:48:14 PM   Radiology DG Chest 2 View  Result Date: 05/28/2019 CLINICAL DATA:  Cough.  Chest pain.  Hypertension. EXAM: CHEST - 2 VIEW COMPARISON:  02/28/2019 FINDINGS: Pacer with leads at right atrium and right ventricle. No lead discontinuity. Midline trachea. Borderline cardiomegaly. Atherosclerosis in the transverse aorta. No pleural effusion or pneumothorax. No congestive failure. Mild left base scarring.  IMPRESSION: No acute cardiopulmonary disease. Aortic Atherosclerosis (ICD10-I70.0). Electronically Signed   By: Abigail Miyamoto M.D.   On: 05/28/2019 13:55   DG Lumbar Spine Complete  Result Date: 05/28/2019 CLINICAL DATA:  Fall. EXAM: LUMBAR SPINE - COMPLETE 4+ VIEW COMPARISON:  October 08, 2014. FINDINGS: Diffuse osteopenia is noted. Moderate degenerative disc disease is noted at L1-2 and L2-3. Minimal retrolisthesis of L2-3 is noted. Mild degenerative disc disease is noted at L5-S1. No fracture is noted. IMPRESSION: Multilevel degenerative disc disease. No acute abnormality seen in the lumbar spine. Electronically Signed   By: Marijo Conception M.D.   On: 05/28/2019 16:44   CT Head Wo Contrast  Result Date: 05/28/2019 CLINICAL DATA:  Altered mental status, ataxia EXAM: CT HEAD WITHOUT CONTRAST TECHNIQUE: Contiguous axial images were obtained from the base of the skull through the vertex without intravenous contrast. COMPARISON:  08/02/2005, 06/04/2014 FINDINGS: Brain: No evidence of acute infarction, hemorrhage, hydrocephalus, extra-axial collection or mass lesion/mass effect. Focal encephalomalacia the left basal ganglia related to prior lacunar infarct. Scattered low-density changes within the periventricular and subcortical white matter compatible with chronic microvascular ischemic change. Moderate diffuse cerebral volume loss. Vascular: Atherosclerotic calcifications involving the large vessels of the skull base. No unexpected hyperdense vessel. Skull: Normal. Negative for fracture or focal lesion. Sinuses/Orbits: No acute finding. Other: None. IMPRESSION: 1.  No acute intracranial findings. 2.  Chronic microvascular ischemic change and cerebral volume loss. Electronically Signed   By: Davina Poke D.O.   On: 05/28/2019 12:19   CT HEAD WO CONTRAST  Result Date: 05/27/2019 CLINICAL DATA:  Two fall EXAM: CT HEAD WITHOUT CONTRAST TECHNIQUE: Contiguous axial images were obtained from the base of the  skull through the vertex without intravenous contrast. COMPARISON:  None. FINDINGS: Brain: No evidence of acute territorial infarction, hemorrhage, hydrocephalus,extra-axial collection or mass lesion/mass effect. There is dilatation the ventricles and sulci consistent with age-related atrophy. Low-attenuation changes in the deep white matter consistent with small vessel ischemia. Vascular: No hyperdense vessel or unexpected calcification. Skull: The skull is intact.  No fracture or focal lesion identified. Mild soft tissue swelling seen over the right frontal skull. Sinuses/Orbits: The visualized paranasal sinuses and mastoid air cells are clear. The orbits and globes intact. Other: None Cervical spine: Alignment: There is a levoconvex scoliotic curvature of the cervical spine. Skull base and vertebrae: Visualized skull base is intact. No atlanto-occipital dissociation. The vertebral body heights are well maintained. No fracture or pathologic osseous lesion seen. There is ankylosis seen of the bilateral C2-C3 transverse processes. Soft tissues and spinal canal: The visualized paraspinal soft tissues are unremarkable. No prevertebral soft tissue swelling is seen. The spinal canal is grossly unremarkable, no large epidural collection or significant canal narrowing. Disc levels: Multilevel cervical spine spondylosis is seen with disc height loss and anterior osteophytes, disc osteophyte complex and uncovertebral osteophytes which is most notable at C5-C6 and C6-C7 with moderate neural foraminal narrowing and mild central canal stenosis. Upper chest: The lung apices are clear. Thoracic inlet is within normal limits. Other: None IMPRESSION: No acute intracranial abnormality. Findings consistent with age related atrophy and chronic small vessel ischemia Mild soft tissue swelling seen over the right frontal skull. No acute fracture or malalignment of the spine. Cervical spine spondylosis most notable from C5-C7.  Electronically Signed   By: Prudencio Pair M.D.   On: 05/27/2019 19:17   CT CERVICAL SPINE WO CONTRAST  Result Date: 05/27/2019 CLINICAL DATA:  Two fall EXAM: CT HEAD WITHOUT CONTRAST TECHNIQUE: Contiguous axial images were obtained from the base of the skull through the vertex without intravenous contrast. COMPARISON:  None. FINDINGS: Brain: No evidence of acute territorial infarction, hemorrhage, hydrocephalus,extra-axial collection or mass lesion/mass effect. There is dilatation the ventricles and sulci consistent with age-related atrophy. Low-attenuation changes in the deep white matter consistent with small vessel ischemia. Vascular: No hyperdense vessel or unexpected calcification. Skull: The skull is intact. No fracture or focal lesion identified. Mild soft tissue swelling seen over the right frontal skull. Sinuses/Orbits: The visualized paranasal sinuses and mastoid air cells are clear. The orbits and globes intact. Other: None Cervical spine: Alignment: There is a levoconvex scoliotic curvature of the cervical spine. Skull base and vertebrae: Visualized skull base is intact. No atlanto-occipital dissociation. The vertebral body heights are well maintained. No fracture or pathologic osseous lesion seen. There is ankylosis seen of the bilateral C2-C3 transverse processes. Soft tissues and spinal canal: The visualized paraspinal soft tissues are unremarkable. No prevertebral soft tissue swelling is seen. The spinal canal is grossly unremarkable, no large epidural collection or significant canal narrowing. Disc levels: Multilevel cervical spine spondylosis is seen with disc height loss and anterior osteophytes, disc osteophyte complex and uncovertebral osteophytes which is most notable at C5-C6 and C6-C7 with moderate neural foraminal narrowing and mild central canal stenosis. Upper chest: The lung apices are clear. Thoracic inlet is within normal limits. Other: None IMPRESSION: No acute intracranial  abnormality. Findings consistent with age related atrophy and chronic small vessel ischemia Mild soft tissue swelling seen over the right frontal skull. No acute fracture or malalignment of the spine. Cervical spine spondylosis most notable from C5-C7. Electronically Signed   By: Prudencio Pair M.D.   On: 05/27/2019 19:17   MR BRAIN WO CONTRAST  Result Date: 05/28/2019 CLINICAL DATA:  Ataxia, stroke suspected. EXAM: MRI HEAD WITHOUT CONTRAST TECHNIQUE: Multiplanar, multiecho pulse sequences of the brain and surrounding structures were obtained without intravenous contrast. COMPARISON:  Head CT 05/28/2019 and MRI 06/04/2014 FINDINGS: Brain: There is a small acute infarct involving the left  corona radiata and basal ganglia posterior to a chronic infarct, and there is also a small acute infarct along the lateral margin of the right thalamus. There is ex vacuo dilatation of the frontal horn of the left lateral ventricle. A few scattered small chronic cerebral hemorrhages are unchanged. T2 hyperintensities in the cerebral white matter bilaterally have slightly progressed from the prior MRI and are nonspecific but compatible with mild chronic small vessel ischemic disease. No mass, midline shift, or extra-axial fluid collection is identified. There is moderate cerebral and cerebellar atrophy. Vascular: Major intracranial vascular flow voids are preserved. Skull and upper cervical spine: Unremarkable bone marrow signal. Sinuses/Orbits: Unremarkable orbits. Minimal mucosal thickening in the paranasal sinuses. Clear paranasal sinuses. Other: Postoperative changes to the right parietal scalp. IMPRESSION: 1. Small acute infarcts in the left basal ganglia/corona radiata and right thalamus. 2. Chronic small vessel ischemia and moderate cerebral atrophy. Electronically Signed   By: Logan Bores M.D.   On: 05/28/2019 15:41   DG Pelvis Portable  Result Date: 05/27/2019 CLINICAL DATA:  86 year old male with fall. EXAM:  PORTABLE PELVIS 1-2 VIEWS COMPARISON:  None. FINDINGS: There is no acute fracture or dislocation. The bones are osteopenic. Mild bilateral hip osteoarthritic changes. There is degenerative changes of the lower lumbar spine. The soft tissues are unremarkable. IMPRESSION: No acute fracture or dislocation. Electronically Signed   By: Anner Crete M.D.   On: 05/27/2019 16:50    Procedures .Critical Care Performed by: Nettie Elm, PA-C Authorized by: Nettie Elm, PA-C   Critical care provider statement:    Critical care time (minutes):  35   Critical care was necessary to treat or prevent imminent or life-threatening deterioration of the following conditions:  Circulatory failure   Critical care was time spent personally by me on the following activities:  Discussions with consultants, evaluation of patient's response to treatment, examination of patient, ordering and performing treatments and interventions, ordering and review of laboratory studies, ordering and review of radiographic studies, pulse oximetry, re-evaluation of patient's condition, obtaining history from patient or surrogate and review of old charts   (including critical care time)  Medications Ordered in ED Medications - No data to display  ED Course  I have reviewed the triage vital signs and the nursing notes.  Pertinent labs & imaging results that were available during my care of the patient were reviewed by me and considered in my medical decision making (see chart for details).  86 year old presents for stroke like symptoms with lkn sometime yesterday. Seen here in emergency department for multiple falls. I have reviewed patient's labs and imaging which did not show any acute abnormality. Apparently patient with facial droop and slurred speech however family does not know when this started. Patient is not a code stroke due to unknown last normal. He is anticoagulated on Eliquis for chronic DVT.  Patient  with expressive aphasia, right-sided facial droop, unable to perform finger-to-nose, asymmetrical smile.  Collateral information from grandson, Detravious Kirchen.  Patient discussed with attending, Dr. Wilson Singer who is aware patient  1230: CONSULT with Dr. Rory Percy with Neurology, recommends MR brain. States pacemakers should be compatible with MR.   1400: Wife now states that patient is complaining about back pain.  He had pelvic x-ray yesterday which was negative.  We will add on lumbar film.  1610: MRI positive for multiple infarcts. Consult with Dr. Rory Percy with Neurology who recommends hospitalist admission for stroke work-up.  Requards to patient's back pain.  He  had negative x-ray here in the ED.  He has no shortening or rotation of legs.  He has no bony tenderness to bilateral legs.  Question patient's right leg weakness due to his stroke.  Low suspicion for acute fracture at this time.  Discussed plan with admission with patient, wife and son in room.  They are agreeable for admission for acute stroke work-up  CONSULT with Dr. Roosevelt Locks with [facility] who will evaluate patient for admission.   Clinical Course as of May 28 1751  Wed May 28, 2019  1705 Negative for infection  Urinalysis, Routine w reflex microscopic [BH]  1705 Negative  Urine rapid drug screen (hosp performed) [BH]  1705 Elevated bili at 1.4 however abd soft, non tender. Low suspicion for acute intra abdominal pathology.  Comprehensive metabolic panel(!) [BH]  123456 No leukocytosis, Hgb stable  CBC(!) [BH]  1706 No acute infarct  CT Head Wo Contrast [BH]  1707 Multiple ischemic CVA  MR BRAIN WO CONTRAST [BH]  1707 No fracture  DG Lumbar Spine Complete [BH]    Clinical Course User Index [BH] Frank Novelo A, PA-C    Patient discussed with attending Dr. Wilson Singer who is agreeable with plan and admission.  The patient appears reasonably stabilized for admission considering the current resources, flow, and capabilities available  in the ED at this time, and I doubt any other [facility] requiring further screening and/or treatment in the ED prior to admission.   MDM Rules/Calculators/A&P                       Final Clinical Impression(s) / ED Diagnoses Final diagnoses:  Cerebrovascular accident (CVA), unspecified mechanism [facility])    Rx / DC Orders ED Discharge Orders    None       Arliene Rosenow A, PA-C 05/28/19 1754    Virgel Manifold, MD 05/30/19 1012

## 2019-05-28 NOTE — ED Notes (Signed)
Dr. Zhang at bedside.  

## 2019-05-28 NOTE — ED Notes (Signed)
Steward Drone (wife) [phone]  Remo Lipps (Son) [phone]  Please call with updates

## 2019-05-28 NOTE — ED Notes (Signed)
Pt transported to CT ?

## 2019-05-28 NOTE — H&P (Signed)
History and Physical    William Mann [id number] DOB: October 15, 1929 DOA: 05/28/2019  PCP: Patient, No Pcp Per   Patient coming from: Home  I have personally briefly reviewed patient's old medical records in [place]  Chief Complaint: Weakness, slurred speech  HPI: William Mann is a 86 y.o. male with medical history significant of right carotid stenosis with right carotid endarterectomy in the past, PVD, hypertension, hyperlipidemia, lower extremity DVT on Eliquis, PPM, bilateral hearing loss, presented with new onset of slurred speech and facial droop. Pt was brought in to ER yesterday for evaluation of 2 falls in one day. CT head was negative and neuro-exam was non-focal and pt was sent home. Wife reported that pt also developed slurred speech sometime last night and his gait remains unsteady today.  Wife also noticed significant right leg weakness, right-sided facial droop, and cough/choking after eating drink since last night.   ED Course: MRI shows small acute infarcts in the left basal ganglia/corona radiata and right thalamus  Review of Systems: As per HPI otherwise 10 point review of systems negative.    Past Medical History:  Diagnosis Date  . Aortic atherosclerosis ([place])   . Bilateral hearing loss   . BPH (benign prostatic hyperplasia)   . Cancer ([place])    basal cell carcinoma  . Carotid artery occlusion   . Constipation   . Coronary artery disease   . DVT (deep venous thrombosis) ([place])   . Emphysema lung ([place])   . Fatty liver   . GERD (gastroesophageal reflux disease)   . History of echocardiogram    Echo 4/17: Vigorous LVEF, EF 65-70%, normal wall motion, grade 1 diastolic dysfunction, mildly dilated ascending aorta (40 mm)  . Hyperlipidemia   . Hypertension   . Insomnia   . PVD (peripheral vascular disease) ([place])   . Thyroid nodule     Past Surgical History:  Procedure Laterality Date  . APPENDECTOMY    . CAROTID ENDARTERECTOMY  03-05-09   right CEA  . COLONOSCOPY  Oct. 2013   one polyp  . HERNIA REPAIR    . LIPOMA EXCISION    . PACEMAKER IMPLANT N/A 12/23/2018   Procedure: PACEMAKER IMPLANT;  Surgeon: Evans Lance, MD;  Location: [facility];  Service: Cardiovascular;  Laterality: N/A;     reports that he has never smoked. He has never used smokeless tobacco. He reports that he does not drink alcohol or use drugs.  Allergies  Allergen Reactions  . Atorvastatin Other (See Comments)    Liver inflammation    . Rosuvastatin Other (See Comments)    Muscle pain  . Viagra [Sildenafil] Other (See Comments)    Reaction ??    Family History  Problem Relation Age of Onset  . Heart disease Sister   . Cancer Sister   . Hypertension Sister   . Heart attack Sister   . Heart disease Brother   . Stroke Brother   . Hypertension Brother   . Heart disease Brother   . Hyperlipidemia Son     Prior to Admission medications   Medication Sig Start Date End Date Taking? Authorizing Provider  apixaban (ELIQUIS) 5 MG TABS tablet Take 1 tablet (5 mg total) by mouth 2 (two) times daily. Resume 12/27/18 evening dose Patient taking differently: Take 5 mg by mouth 2 (two) times daily.  12/24/18  Yes Chanetta Marshall K, NP  aspirin EC 81 MG tablet Take 81 mg by mouth 2 (two) times  a week.    Yes [provider]  Calcium Carbonate (CALTRATE 600 PO) Take 600 mg by mouth 2 (two) times a week.    Yes [provider]  Cholecalciferol (VITAMIN D-3 PO) Take 1 capsule by mouth daily.    Yes [provider]  Coenzyme Q10 (COQ-10) 100 MG CAPS Take 100 mg by mouth daily.    Yes [provider]  cyanocobalamin 100 MCG tablet Take 100 mcg by mouth daily.   Yes [provider]  Multiple Vitamin (MULTIVITAMIN) tablet Take 1 tablet by mouth daily.   Yes [provider]  senna (SENOKOT) 8.6 MG tablet Take 1 tablet by mouth 2 (two) times daily as needed for constipation.   Yes [provider]  simvastatin (ZOCOR) 10 MG tablet Take 10 mg by mouth at bedtime. 05/07/19  Yes [provider]  tamsulosin (FLOMAX) 0.4 MG CAPS capsule Take 0.4 mg by mouth daily. 05/07/19  Yes [provider]  zolpidem (AMBIEN) 5 MG tablet Take 5 mg by mouth at bedtime as needed for sleep.   Yes [provider]    Physical Exam: Vitals:   05/28/19 1645 05/28/19 1700 05/28/19 1730 05/28/19 1900  BP: (!) 166/70 (!) 155/91 (!) 166/85 (!) 156/101  Pulse: 86 94 63 94  Resp: 20 (!) 24 17 19   Temp:      TempSrc:      SpO2: 97% 98% 98% 96%    Constitutional: NAD, calm, comfortable Vitals:   05/28/19 1645 05/28/19 1700 05/28/19 1730 05/28/19 1900  BP: (!) 166/70 (!) 155/91 (!) 166/85 (!) 156/101  Pulse: 86 94 63 94  Resp: 20 (!) 24 17 19   Temp:      TempSrc:      SpO2: 97% 98% 98% 96%   Eyes: PERRL, lids and conjunctivae normal ENMT: Mucous membranes are moist. Posterior pharynx clear of any exudate or lesions.Normal dentition.  Neck: normal, supple, no masses, no thyromegaly Respiratory: clear to auscultation bilaterally, no wheezing, no crackles. Normal respiratory effort. No accessory muscle use.  Cardiovascular: Regular rate and rhythm, no murmurs / rubs / gallops. No extremity edema. 2+ pedal pulses. No carotid bruits.  Abdomen: no tenderness, no masses palpated. No hepatosplenomegaly. Bowel sounds positive.  Musculoskeletal: no clubbing / cyanosis. No joint deformity upper and lower extremities. Good ROM, no contractures. Normal muscle tone.  Skin: no rashes, lesions, ulcers. No induration Neurologic: Right facial droop, right-sided tongue deviation, right-sided strength 4/5 compared to 5/5 on the left side. Psychiatric: Mumbling.    Labs on Admission: I have personally reviewed following labs and imaging studies  CBC: Recent Labs  Lab 05/27/19 1629 05/28/19 1154 05/28/19 1216  WBC 6.6 9.1  --   NEUTROABS  --  7.3  --   HGB 14.3 15.2 15.0  HCT  43.6 45.9 44.0  MCV 103.6* 104.1*  --   PLT 160 174  --    Basic Metabolic Panel: Recent Labs  Lab 05/27/19 1629 05/28/19 1154 05/28/19 1216  NA 141 141 142  K 3.8 3.9 3.8  CL 110 111 110  CO2 22 22  --   GLUCOSE 135* 104* 98  BUN 17 14 15   CREATININE 0.98 0.85 0.70  CALCIUM 9.2 8.7*  --    GFR: Estimated Creatinine Clearance: 61.4 mL/min (by C-G formula based on SCr of 0.7 mg/dL). Liver Function Tests: Recent Labs  Lab 05/27/19 1629 05/28/19 1154  AST 31 32  ALT 25 25  ALKPHOS 63 74  BILITOT 0.7 1.4*  PROT 5.9* 6.1*  ALBUMIN 3.2* 3.4*   No results for input(s): LIPASE, AMYLASE in the last 168 hours. No results for input(s): AMMONIA in the last 168 hours. Coagulation Profile: Recent Labs  Lab 05/27/19 1629 05/28/19 1154  INR 1.1 1.1   Cardiac Enzymes: No results for input(s): CKTOTAL, CKMB, CKMBINDEX, TROPONINI in the last 168 hours. BNP (last 3 results) No results for input(s): PROBNP in the last 8760 hours. HbA1C: No results for input(s): HGBA1C in the last 72 hours. CBG: Recent Labs  Lab 05/28/19 1152  GLUCAP 97   Lipid Profile: No results for input(s): CHOL, HDL, LDLCALC, TRIG, CHOLHDL, LDLDIRECT in the last 72 hours. Thyroid Function Tests: No results for input(s): TSH, T4TOTAL, FREET4, T3FREE, THYROIDAB in the last 72 hours. Anemia Panel: No results for input(s): VITAMINB12, FOLATE, FERRITIN, TIBC, IRON, RETICCTPCT in the last 72 hours. Urine analysis:    Component Value Date/Time   COLORURINE YELLOW 05/28/2019 1300   APPEARANCEUR CLEAR 05/28/2019 1300   LABSPEC 1.018 05/28/2019 1300   PHURINE 6.0 05/28/2019 1300   GLUCOSEU NEGATIVE 05/28/2019 1300   HGBUR NEGATIVE 05/28/2019 1300   BILIRUBINUR NEGATIVE 05/28/2019 1300   KETONESUR NEGATIVE 05/28/2019 1300   PROTEINUR NEGATIVE 05/28/2019 1300   UROBILINOGEN 1.0 10/08/2014 0214   NITRITE NEGATIVE 05/28/2019 1300   LEUKOCYTESUR NEGATIVE 05/28/2019 1300    Radiological Exams on  Admission: DG Chest 2 View  Result Date: 05/28/2019 CLINICAL DATA:  Cough.  Chest pain.  Hypertension. EXAM: CHEST - 2 VIEW COMPARISON:  02/28/2019 FINDINGS: Pacer with leads at right atrium and right ventricle. No lead discontinuity. Midline trachea. Borderline cardiomegaly. Atherosclerosis in the transverse aorta. No pleural effusion or pneumothorax. No congestive failure. Mild left base scarring. IMPRESSION: No acute cardiopulmonary disease. Aortic Atherosclerosis (ICD10-I70.0). Electronically Signed   By: Abigail Miyamoto M.D.   On: 05/28/2019 13:55   DG Lumbar Spine Complete  Result Date: 05/28/2019 CLINICAL DATA:  Fall. EXAM: LUMBAR SPINE - COMPLETE 4+ VIEW COMPARISON:  October 08, 2014. FINDINGS: Diffuse osteopenia is noted. Moderate degenerative disc disease is noted at L1-2 and L2-3. Minimal retrolisthesis of L2-3 is noted. Mild degenerative disc disease is noted at L5-S1. No fracture is noted. IMPRESSION: Multilevel degenerative disc disease. No acute abnormality seen in the lumbar spine. Electronically Signed   By: Marijo Conception M.D.   On: 05/28/2019 16:44   CT Head Wo Contrast  Result Date: 05/28/2019 CLINICAL DATA:  Altered mental status, ataxia EXAM: CT HEAD WITHOUT CONTRAST TECHNIQUE: Contiguous axial images were obtained from the base of the skull through the vertex without intravenous contrast. COMPARISON:  08/02/2005, 06/04/2014 FINDINGS: Brain: No evidence of acute infarction, hemorrhage, hydrocephalus, extra-axial collection or mass lesion/mass effect. Focal encephalomalacia the left basal ganglia related to prior lacunar infarct. Scattered low-density changes within the periventricular and subcortical white matter compatible with chronic microvascular ischemic change. Moderate diffuse cerebral volume loss. Vascular: Atherosclerotic calcifications involving the large vessels of the skull base. No unexpected hyperdense vessel. Skull: Normal. Negative for fracture or focal lesion.  Sinuses/Orbits: No acute finding. Other: None. IMPRESSION: 1.  No acute intracranial findings. 2.  Chronic microvascular ischemic change and cerebral volume loss. Electronically Signed   By: Davina Poke D.O.   On: 05/28/2019 12:19   CT HEAD WO CONTRAST  Result Date: 05/27/2019 CLINICAL DATA:  Two fall EXAM: CT HEAD WITHOUT CONTRAST TECHNIQUE: Contiguous axial images were obtained from the base of the skull through the vertex without intravenous contrast. COMPARISON:  None. FINDINGS: Brain: No evidence of acute territorial infarction, hemorrhage, hydrocephalus,extra-axial collection or mass lesion/mass effect. There is dilatation the ventricles and sulci consistent with age-related atrophy. Low-attenuation changes in the deep white matter consistent with small vessel ischemia. Vascular: No hyperdense vessel or unexpected calcification. Skull: The skull is intact. No fracture or focal lesion identified. Mild soft tissue swelling seen over the right frontal skull. Sinuses/Orbits: The visualized paranasal sinuses and mastoid air cells are clear. The orbits and globes intact. Other: None Cervical spine: Alignment: There is a levoconvex scoliotic curvature of the cervical spine. Skull base and vertebrae: Visualized skull base is intact. No atlanto-occipital dissociation. The vertebral body heights are well maintained. No fracture or pathologic osseous lesion seen. There is ankylosis seen of the bilateral C2-C3 transverse processes. Soft tissues and spinal canal: The visualized paraspinal soft tissues are unremarkable. No prevertebral soft tissue swelling is seen. The spinal canal is grossly unremarkable, no large epidural collection or significant canal narrowing. Disc levels: Multilevel cervical spine spondylosis is seen with disc height loss and anterior osteophytes, disc osteophyte complex and uncovertebral osteophytes which is most notable at C5-C6 and C6-C7 with moderate neural foraminal narrowing and mild  central canal stenosis. Upper chest: The lung apices are clear. Thoracic inlet is within normal limits. Other: None IMPRESSION: No acute intracranial abnormality. Findings consistent with age related atrophy and chronic small vessel ischemia Mild soft tissue swelling seen over the right frontal skull. No acute fracture or malalignment of the spine. Cervical spine spondylosis most notable from C5-C7. Electronically Signed   By: Prudencio Pair M.D.   On: 05/27/2019 19:17   CT CERVICAL SPINE WO CONTRAST  Result Date: 05/27/2019 CLINICAL DATA:  Two fall EXAM: CT HEAD WITHOUT CONTRAST TECHNIQUE: Contiguous axial images were obtained from the base of the skull through the vertex without intravenous contrast. COMPARISON:  None. FINDINGS: Brain: No evidence of acute territorial infarction, hemorrhage, hydrocephalus,extra-axial collection or mass lesion/mass effect. There is dilatation the ventricles and sulci consistent with age-related atrophy. Low-attenuation changes in the deep white matter consistent with small vessel ischemia. Vascular: No hyperdense vessel or unexpected calcification. Skull: The skull is intact. No fracture or focal lesion identified. Mild soft tissue swelling seen over the right frontal skull. Sinuses/Orbits: The visualized paranasal sinuses and mastoid air cells are clear. The orbits and globes intact. Other: None Cervical spine: Alignment: There is a levoconvex scoliotic curvature of the cervical spine. Skull base and vertebrae: Visualized skull base is intact. No atlanto-occipital dissociation. The vertebral body heights are well maintained. No fracture or pathologic osseous lesion seen. There is ankylosis seen of the bilateral C2-C3 transverse processes. Soft tissues and spinal canal: The visualized paraspinal soft tissues are unremarkable. No prevertebral soft tissue swelling is seen. The spinal canal is grossly unremarkable, no large epidural collection or significant canal narrowing. Disc  levels: Multilevel cervical spine spondylosis is seen with disc height loss and anterior osteophytes, disc osteophyte complex and uncovertebral osteophytes which is most notable at C5-C6 and C6-C7 with moderate neural foraminal narrowing and mild central canal stenosis. Upper chest: The lung apices are clear. Thoracic inlet is within normal limits. Other: None IMPRESSION: No acute intracranial abnormality. Findings consistent with age related atrophy and chronic small vessel ischemia Mild soft tissue swelling seen over the right frontal skull. No acute fracture or malalignment of the spine. Cervical spine spondylosis most notable from C5-C7. Electronically Signed   By: Prudencio Pair M.D.   On: 05/27/2019 19:17   MR BRAIN WO  CONTRAST  Result Date: 05/28/2019 CLINICAL DATA:  Ataxia, stroke suspected. EXAM: MRI HEAD WITHOUT CONTRAST TECHNIQUE: Multiplanar, multiecho pulse sequences of the brain and surrounding structures were obtained without intravenous contrast. COMPARISON:  Head CT 05/28/2019 and MRI 06/04/2014 FINDINGS: Brain: There is a small acute infarct involving the left corona radiata and basal ganglia posterior to a chronic infarct, and there is also a small acute infarct along the lateral margin of the right thalamus. There is ex vacuo dilatation of the frontal horn of the left lateral ventricle. A few scattered small chronic cerebral hemorrhages are unchanged. T2 hyperintensities in the cerebral white matter bilaterally have slightly progressed from the prior MRI and are nonspecific but compatible with mild chronic small vessel ischemic disease. No mass, midline shift, or extra-axial fluid collection is identified. There is moderate cerebral and cerebellar atrophy. Vascular: Major intracranial vascular flow voids are preserved. Skull and upper cervical spine: Unremarkable bone marrow signal. Sinuses/Orbits: Unremarkable orbits. Minimal mucosal thickening in the paranasal sinuses. Clear paranasal  sinuses. Other: Postoperative changes to the right parietal scalp. IMPRESSION: 1. Small acute infarcts in the left basal ganglia/corona radiata and right thalamus. 2. Chronic small vessel ischemia and moderate cerebral atrophy. Electronically Signed   By: Logan Bores M.D.   On: 05/28/2019 15:41   DG Pelvis Portable  Result Date: 05/27/2019 CLINICAL DATA:  86 year old male with fall. EXAM: PORTABLE PELVIS 1-2 VIEWS COMPARISON:  None. FINDINGS: There is no acute fracture or dislocation. The bones are osteopenic. Mild bilateral hip osteoarthritic changes. There is degenerative changes of the lower lumbar spine. The soft tissues are unremarkable. IMPRESSION: No acute fracture or dislocation. Electronically Signed   By: Anner Crete M.D.   On: 05/27/2019 16:50    EKG: Independently reviewed. Paced  Assessment/Plan Active Problems:   Stroke (cerebrum) (HCC)   CVA (cerebral vascular accident) ([place])  Acute ischemic stroke -The nature of multifocal CVA implying an embolic nautre, but the patient is on systemic anticoagulation with Eliquis so theorotically risk of embolic stroke low, but agreed with neurology evaluation, will check CTA of head and neck to rule out any big vessel plague. -Patient did not tolerate either atorvastatin or rosuvastatin in the past, will increase his simvastatin dosage, but swallowing will be a problem as of now.  -The location seems indicating lacunar stroke which related to uncontrolled hypertension? -ASA suppository -Speech evaluation and PT OT evaluation -Aspiration precautions  Uncontrolled hypertension Allow permissive hypertension for 2 to 3 days, as needed labetalol for now  DVT Change Eliquis to heparin drip for now  BPH On Flomax  DVT prophylaxis: Consult pharmacy for heparin drip Code Status: Full code Family Communication: Discussed with wife and son at bedside, all questions answered with my best knowledge Disposition Plan: Likely will need  rehab/SNF Consults called: Neurology Admission status: Telemetry admission   Lequita Halt MD [facility] Pager [phone]    05/28/2019, 7:16 PM

## 2019-05-28 NOTE — ED Notes (Signed)
Pt transported to MRI 

## 2019-05-28 NOTE — Progress Notes (Signed)
Informed of MRI for today.   Device system confirmed to be MRI conditional, with implant date > 6 weeks ago and no evidence of abandoned or epicardial leads in review of most recent CXR Interrogation from today reviewed, pt is currently AS-VP at 80 bpm Change device settings for MRI to DOO at 95 bpm  Program device back to pre-MRI settings after completion of exam.  Shirley Friar, PA-C  05/28/2019 2:43 PM

## 2019-05-28 NOTE — ED Triage Notes (Signed)
Pt arrives via EMS from home with reports of a fall and slurred speech yesterday where he was seen here for and had CT and labs. Endorses lower back pain and back of head pain. PCP sent due to slurred speech. Hx of dementia. EMS reports the speech was slurred yesterday. Cough for a week and intermittent hiccups.

## 2019-05-29 ENCOUNTER — Inpatient Hospital Stay (HOSPITAL_COMMUNITY): Payer: Medicare Other

## 2019-05-29 DIAGNOSIS — I6389 Other cerebral infarction: Secondary | ICD-10-CM | POA: Diagnosis not present

## 2019-05-29 DIAGNOSIS — E7800 Pure hypercholesterolemia, unspecified: Secondary | ICD-10-CM

## 2019-05-29 DIAGNOSIS — I639 Cerebral infarction, unspecified: Secondary | ICD-10-CM

## 2019-05-29 DIAGNOSIS — Z7901 Long term (current) use of anticoagulants: Secondary | ICD-10-CM

## 2019-05-29 LAB — CBC
HCT: 45.9 % (ref 39.0–52.0)
Hemoglobin: 15.5 g/dL (ref 13.0–17.0)
MCH: 34.3 pg — ABNORMAL HIGH (ref 26.0–34.0)
MCHC: 33.8 g/dL (ref 30.0–36.0)
MCV: 101.5 fL — ABNORMAL HIGH (ref 80.0–100.0)
Platelets: 180 10*3/uL (ref 150–400)
RBC: 4.52 MIL/uL (ref 4.22–5.81)
RDW: 13.3 % (ref 11.5–15.5)
WBC: 14.3 10*3/uL — ABNORMAL HIGH (ref 4.0–10.5)
nRBC: 0 % (ref 0.0–0.2)

## 2019-05-29 LAB — POCT I-STAT 7, (LYTES, BLD GAS, ICA,H+H)
Acid-base deficit: 4 mmol/L — ABNORMAL HIGH (ref 0.0–2.0)
Bicarbonate: 20 mmol/L (ref 20.0–28.0)
Calcium, Ion: 1.22 mmol/L (ref 1.15–1.40)
HCT: 45 % (ref 39.0–52.0)
Hemoglobin: 15.3 g/dL (ref 13.0–17.0)
O2 Saturation: 94 %
Potassium: 3.7 mmol/L (ref 3.5–5.1)
Sodium: 138 mmol/L (ref 135–145)
TCO2: 21 mmol/L — ABNORMAL LOW (ref 22–32)
pCO2 arterial: 31.6 mmHg — ABNORMAL LOW (ref 32.0–48.0)
pH, Arterial: 7.409 (ref 7.350–7.450)
pO2, Arterial: 69 mmHg — ABNORMAL LOW (ref 83.0–108.0)

## 2019-05-29 LAB — BASIC METABOLIC PANEL
Anion gap: 17 — ABNORMAL HIGH (ref 5–15)
BUN: 14 mg/dL (ref 8–23)
CO2: 16 mmol/L — ABNORMAL LOW (ref 22–32)
Calcium: 8.5 mg/dL — ABNORMAL LOW (ref 8.9–10.3)
Chloride: 107 mmol/L (ref 98–111)
Creatinine, Ser: 0.75 mg/dL (ref 0.61–1.24)
GFR calc Af Amer: >60 mL/min (ref >60)
GFR calc non Af Amer: >60 mL/min (ref >60)
Glucose, Bld: 104 mg/dL — ABNORMAL HIGH (ref 70–99)
Potassium: 3.9 mmol/L (ref 3.5–5.1)
Sodium: 140 mmol/L (ref 135–145)

## 2019-05-29 LAB — LIPID PANEL
Cholesterol: 222 mg/dL — ABNORMAL HIGH (ref 0–200)
HDL: 53 mg/dL (ref >40)
LDL Cholesterol: 160 mg/dL — ABNORMAL HIGH (ref 0–99)
Total CHOL/HDL Ratio: 4.2 RATIO
Triglycerides: 43 mg/dL (ref <150)
VLDL: 9 mg/dL (ref 0–40)

## 2019-05-29 LAB — HEPARIN LEVEL (UNFRACTIONATED)
Heparin Unfractionated: 0.75 IU/mL — ABNORMAL HIGH (ref 0.30–0.70)
Heparin Unfractionated: 1.05 IU/mL — ABNORMAL HIGH (ref 0.30–0.70)

## 2019-05-29 LAB — ECHOCARDIOGRAM COMPLETE

## 2019-05-29 LAB — LACTIC ACID, PLASMA
Lactic Acid, Venous: 1.8 mmol/L (ref 0.5–1.9)
Lactic Acid, Venous: 2.3 mmol/L (ref 0.5–1.9)

## 2019-05-29 LAB — APTT
aPTT: 54 seconds — ABNORMAL HIGH (ref 24–36)
aPTT: 85 seconds — ABNORMAL HIGH (ref 24–36)
aPTT: 75 seconds — ABNORMAL HIGH (ref 24–36)

## 2019-05-29 LAB — HEMOGLOBIN A1C
Hgb A1c MFr Bld: 5 % (ref 4.8–5.6)
Mean Plasma Glucose: 96.8 mg/dL

## 2019-05-29 LAB — MAGNESIUM: Magnesium: 1.9 mg/dL (ref 1.7–2.4)

## 2019-05-29 LAB — PROCALCITONIN: Procalcitonin: 0.39 ng/mL

## 2019-05-29 MED ORDER — SODIUM BICARBONATE 8.4 % IV SOLN
50.0000 meq | Freq: Once | INTRAVENOUS | Status: AC
  Administered 2019-05-30: 50 meq via INTRAVENOUS
  Filled 2019-05-29: qty 50

## 2019-05-29 MED ORDER — PERFLUTREN LIPID MICROSPHERE
1.0000 mL | INTRAVENOUS | Status: AC | PRN
  Administered 2019-05-29: 5 mL via INTRAVENOUS
  Filled 2019-05-29: qty 10

## 2019-05-29 MED ORDER — MAGNESIUM SULFATE 2 GM/50ML IV SOLN
2.0000 g | Freq: Once | INTRAVENOUS | Status: AC
  Administered 2019-05-29: 2 g via INTRAVENOUS
  Filled 2019-05-29: qty 50

## 2019-05-29 MED ORDER — SIMVASTATIN 20 MG PO TABS
40.0000 mg | ORAL_TABLET | Freq: Every day | ORAL | Status: DC
  Filled 2019-05-29: qty 2

## 2019-05-29 MED ORDER — METOPROLOL TARTRATE 5 MG/5ML IV SOLN
2.5000 mg | Freq: Four times a day (QID) | INTRAVENOUS | Status: DC
  Administered 2019-05-29 – 2019-05-30 (×4): 2.5 mg via INTRAVENOUS
  Filled 2019-05-29 (×4): qty 5

## 2019-05-29 MED ORDER — METOPROLOL TARTRATE 5 MG/5ML IV SOLN: 2.5000 mg | INTRAVENOUS | Status: DC | PRN

## 2019-05-29 NOTE — ED Notes (Signed)
Pt had run of v tach on the monitor, strip printed and admitting doctor made aware, awaiting response. Pt currently in NSR with PVCs. Pt asleep but arousable.

## 2019-05-29 NOTE — Progress Notes (Signed)
Bilateral lower extremity venous duplex has been completed. Preliminary results can be found in CV Proc through chart review.  Results were given to the patient's nurse, Lovena Le.  05/29/19 11:11 AM William Mann RVT

## 2019-05-29 NOTE — Progress Notes (Signed)
  Echocardiogram 2D Echocardiogram has been performed with Definity.  William Mann 05/29/2019, 4:03 PM

## 2019-05-29 NOTE — ED Notes (Signed)
Pt had small bowel movement and had gotten in all over himself and his hands. Pt cleaned, linens and gown changed.

## 2019-05-29 NOTE — Progress Notes (Signed)
STROKE TEAM PROGRESS NOTE   INTERVAL HISTORY Wife at bedside. Pt lying in bed, awake alert, severely dysarthric, still has right facial droop and right UE weakness. However, BLE nearly symmetrical on strength. BP 150s. Wife denies any dehydration. CTA showed b/l cavernous ICA moderate to severe stenosis. Of note, pt has frequent PVCs on tele. Pacer interrogation pending.   Vitals:   05/29/19 0930 05/29/19 1000 05/29/19 1200 05/29/19 1300  BP: (!) 152/80 (!) 145/85 (!) 148/84 (!) 157/89  Pulse: 94 96 94 93  Resp: 17 14 20  (!) 21  Temp:      TempSrc:      SpO2: 97% 97% 96% 96%    CBC:  Recent Labs  Lab 05/28/19 1154 05/28/19 1154 05/28/19 1216 05/29/19 1147  WBC 9.1  --   --  14.3*  NEUTROABS 7.3  --   --   --   HGB 15.2   < > 15.0 15.5  HCT 45.9   < > 44.0 45.9  MCV 104.1*  --   --  101.5*  PLT 174  --   --  180   < > = values in this interval not displayed.    Basic Metabolic Panel:  Recent Labs  Lab 05/27/19 1629 05/27/19 1629 05/28/19 1154 05/28/19 1216  NA 141   < > 141 142  K 3.8   < > 3.9 3.8  CL 110   < > 111 110  CO2 22  --  22  --   GLUCOSE 135*   < > 104* 98  BUN 17   < > 14 15  CREATININE 0.98   < > 0.85 0.70  CALCIUM 9.2  --  8.7*  --    < > = values in this interval not displayed.   Lipid Panel:     Component Value Date/Time   CHOL 222 (H) 05/29/2019 1147   TRIG 43 05/29/2019 1147   HDL 53 05/29/2019 1147   CHOLHDL 4.2 05/29/2019 1147   VLDL 9 05/29/2019 1147   LDLCALC 160 (H) 05/29/2019 1147   HgbA1c:  Lab Results  Component Value Date   HGBA1C 5.0 05/29/2019   Urine Drug Screen:     Component Value Date/Time   LABOPIA NONE DETECTED 05/28/2019 1300   COCAINSCRNUR NONE DETECTED 05/28/2019 1300   LABBENZ NONE DETECTED 05/28/2019 1300   AMPHETMU NONE DETECTED 05/28/2019 1300   THCU NONE DETECTED 05/28/2019 1300   LABBARB NONE DETECTED 05/28/2019 1300    Alcohol Level     Component Value Date/Time   ETH <10 05/28/2019 1154     IMAGING past 24 hours CT ANGIO HEAD W OR WO CONTRAST  Result Date: 05/28/2019 CLINICAL DATA:  Follow-up examination for acute stroke. EXAM: CT ANGIOGRAPHY HEAD AND NECK TECHNIQUE: Multidetector CT imaging of the head and neck was performed using the standard protocol during bolus administration of intravenous contrast. Multiplanar CT image reconstructions and MIPs were obtained to evaluate the vascular anatomy. Carotid stenosis measurements (when applicable) are obtained utilizing NASCET criteria, using the distal internal carotid diameter as the denominator. CONTRAST:  18mL OMNIPAQUE IOHEXOL 350 MG/ML SOLN COMPARISON:  Prior CT and MRI from earlier the same day. FINDINGS: CTA NECK FINDINGS Aortic arch: Visualized aortic arch a within normal limits for caliber. Bovine branch pattern with common origin of the right brachiocephalic and left common carotid artery noted. Mild atheromatous plaque within the arch itself. No hemodynamically significant stenosis about the origin of the great vessels. Visualized subclavian arteries patent  without flow-limiting stenosis. Right carotid system: Right CCA tortuous proximally but is widely patent to the bifurcation without stenosis. Mixed eccentric plaque about the right bifurcation without hemodynamically significant stenosis. Probable changes related to previous right carotid endarterectomy. Right ICA widely patent distally to the skull base without stenosis, dissection, or occlusion eccentric plaque at the cervical petrous junction at the skull base with short-segment mild stenosis noted (series 6, image 225). Calcified atherosclerotic plaque seen within this region on prior noncontrast head CT. Left carotid system: Left CCA tortuous proximally but is widely patent to the bifurcation without significant stenosis. Bulky calcified plaque about the left bifurcation/proximal left ICA with associated stenosis of up to 50% by NASCET criteria. Left ICA mildly tortuous but  otherwise widely patent distally to the skull base without stenosis, dissection or occlusion. Vertebral arteries: Both vertebral arteries arise from the subclavian arteries. Focal plaque at the origin of the right vertebral artery with mild ostial stenosis. Left vertebral artery dominant. Vertebral arteries mildly tortuous but otherwise widely patent within the neck without stenosis, dissection or occlusion. Skeleton: No visible acute osseous abnormality. No worrisome lytic or blastic osseous lesions. Dens is somewhat angulated, nonspecific, but could be related to remotely healed trauma. Moderate cervical spondylosis present at C5-6 and C6-7. Other neck: No other acute soft tissue abnormality within the neck. No mass lesion or adenopathy. 7 mm right thyroid nodule noted, of doubtful significance given size and patient age. No follow-up imaging recommended regarding this lesion. Upper chest: Visualized upper chest demonstrates no acute finding. Scattered predominantly subpleural reticular linear densities could reflect atelectasis, scarring, or possibly fibrosis. Left-sided pacemaker/AICD noted. Review of the MIP images confirms the above findings CTA HEAD FINDINGS Anterior circulation: Petrous segments widely patent. Scattered atheromatous plaque throughout the cavernous/supraclinoid ICAs bilaterally. Associated short-segment severe stenosis seen at the supraclinoid right ICA (series 6, image 265). Moderate to severe short-segment stenosis present at the para clinoid left ICA as well (series 6, image 263). ICA termini well perfused. A1 segments patent bilaterally. Normal anterior communicating artery complex. Anterior cerebral arteries patent to their distal aspects without stenosis. No M1 stenosis or occlusion. Normal MCA bifurcations. Distal MCA branches well perfused and symmetric. Posterior circulation: Dominant left vertebral artery widely patent to the vertebrobasilar junction without stenosis. Mild  atheromatous irregularity within the right vertebral artery without stenosis. Both picas patent. Basilar widely patent to its distal aspect without stenosis. Superior cerebral arteries patent bilaterally. Both PCAs primarily supplied via the basilar. Multifocal moderate to severe bilateral P2 stenoses noted. PCAs remain well perfused to their distal aspects. Venous sinuses: Patent allowing for timing the contrast bolus. Anatomic variants: None significant. 2 mm focal outpouching arising from the supraclinoid left ICA demonstrates a tiny vessel extending from its apex, felt to be most consistent with a vascular infundibulum related to a hypoplastic left posterior communicating artery. No visible aneurysm. Review of the MIP images confirms the above findings IMPRESSION: 1. Negative CTA for large vessel occlusion. 2. Bulky atheromatous plaque about the left carotid bifurcation with associated 50% stenosis by NASCET criteria. 3. Prior right carotid endarterectomy without significant residual or recurrent stenosis. 4. Moderate to severe bilateral stenoses involving the supraclinoid ICAs bilaterally, right worse than left. 5. Moderate to severe multifocal bilateral P2 stenoses. Electronically Signed   By: Jeannine Boga M.D.   On: 05/28/2019 22:23   DG Lumbar Spine Complete  Result Date: 05/28/2019 CLINICAL DATA:  Fall. EXAM: LUMBAR SPINE - COMPLETE 4+ VIEW COMPARISON:  October 08, 2014. FINDINGS: Diffuse  osteopenia is noted. Moderate degenerative disc disease is noted at L1-2 and L2-3. Minimal retrolisthesis of L2-3 is noted. Mild degenerative disc disease is noted at L5-S1. No fracture is noted. IMPRESSION: Multilevel degenerative disc disease. No acute abnormality seen in the lumbar spine. Electronically Signed   By: Marijo Conception M.D.   On: 05/28/2019 16:44   CT ANGIO NECK W OR WO CONTRAST  Result Date: 05/28/2019 CLINICAL DATA:  Follow-up examination for acute stroke. EXAM: CT ANGIOGRAPHY HEAD AND  NECK TECHNIQUE: Multidetector CT imaging of the head and neck was performed using the standard protocol during bolus administration of intravenous contrast. Multiplanar CT image reconstructions and MIPs were obtained to evaluate the vascular anatomy. Carotid stenosis measurements (when applicable) are obtained utilizing NASCET criteria, using the distal internal carotid diameter as the denominator. CONTRAST:  152mL OMNIPAQUE IOHEXOL 350 MG/ML SOLN COMPARISON:  Prior CT and MRI from earlier the same day. FINDINGS: CTA NECK FINDINGS Aortic arch: Visualized aortic arch a within normal limits for caliber. Bovine branch pattern with common origin of the right brachiocephalic and left common carotid artery noted. Mild atheromatous plaque within the arch itself. No hemodynamically significant stenosis about the origin of the great vessels. Visualized subclavian arteries patent without flow-limiting stenosis. Right carotid system: Right CCA tortuous proximally but is widely patent to the bifurcation without stenosis. Mixed eccentric plaque about the right bifurcation without hemodynamically significant stenosis. Probable changes related to previous right carotid endarterectomy. Right ICA widely patent distally to the skull base without stenosis, dissection, or occlusion eccentric plaque at the cervical petrous junction at the skull base with short-segment mild stenosis noted (series 6, image 225). Calcified atherosclerotic plaque seen within this region on prior noncontrast head CT. Left carotid system: Left CCA tortuous proximally but is widely patent to the bifurcation without significant stenosis. Bulky calcified plaque about the left bifurcation/proximal left ICA with associated stenosis of up to 50% by NASCET criteria. Left ICA mildly tortuous but otherwise widely patent distally to the skull base without stenosis, dissection or occlusion. Vertebral arteries: Both vertebral arteries arise from the subclavian arteries.  Focal plaque at the origin of the right vertebral artery with mild ostial stenosis. Left vertebral artery dominant. Vertebral arteries mildly tortuous but otherwise widely patent within the neck without stenosis, dissection or occlusion. Skeleton: No visible acute osseous abnormality. No worrisome lytic or blastic osseous lesions. Dens is somewhat angulated, nonspecific, but could be related to remotely healed trauma. Moderate cervical spondylosis present at C5-6 and C6-7. Other neck: No other acute soft tissue abnormality within the neck. No mass lesion or adenopathy. 7 mm right thyroid nodule noted, of doubtful significance given size and patient age. No follow-up imaging recommended regarding this lesion. Upper chest: Visualized upper chest demonstrates no acute finding. Scattered predominantly subpleural reticular linear densities could reflect atelectasis, scarring, or possibly fibrosis. Left-sided pacemaker/AICD noted. Review of the MIP images confirms the above findings CTA HEAD FINDINGS Anterior circulation: Petrous segments widely patent. Scattered atheromatous plaque throughout the cavernous/supraclinoid ICAs bilaterally. Associated short-segment severe stenosis seen at the supraclinoid right ICA (series 6, image 265). Moderate to severe short-segment stenosis present at the para clinoid left ICA as well (series 6, image 263). ICA termini well perfused. A1 segments patent bilaterally. Normal anterior communicating artery complex. Anterior cerebral arteries patent to their distal aspects without stenosis. No M1 stenosis or occlusion. Normal MCA bifurcations. Distal MCA branches well perfused and symmetric. Posterior circulation: Dominant left vertebral artery widely patent to the vertebrobasilar junction without  stenosis. Mild atheromatous irregularity within the right vertebral artery without stenosis. Both picas patent. Basilar widely patent to its distal aspect without stenosis. Superior cerebral  arteries patent bilaterally. Both PCAs primarily supplied via the basilar. Multifocal moderate to severe bilateral P2 stenoses noted. PCAs remain well perfused to their distal aspects. Venous sinuses: Patent allowing for timing the contrast bolus. Anatomic variants: None significant. 2 mm focal outpouching arising from the supraclinoid left ICA demonstrates a tiny vessel extending from its apex, felt to be most consistent with a vascular infundibulum related to a hypoplastic left posterior communicating artery. No visible aneurysm. Review of the MIP images confirms the above findings IMPRESSION: 1. Negative CTA for large vessel occlusion. 2. Bulky atheromatous plaque about the left carotid bifurcation with associated 50% stenosis by NASCET criteria. 3. Prior right carotid endarterectomy without significant residual or recurrent stenosis. 4. Moderate to severe bilateral stenoses involving the supraclinoid ICAs bilaterally, right worse than left. 5. Moderate to severe multifocal bilateral P2 stenoses. Electronically Signed   By: Jeannine Boga M.D.   On: 05/28/2019 22:23   MR BRAIN WO CONTRAST  Result Date: 05/28/2019 CLINICAL DATA:  Ataxia, stroke suspected. EXAM: MRI HEAD WITHOUT CONTRAST TECHNIQUE: Multiplanar, multiecho pulse sequences of the brain and surrounding structures were obtained without intravenous contrast. COMPARISON:  Head CT 05/28/2019 and MRI 06/04/2014 FINDINGS: Brain: There is a small acute infarct involving the left corona radiata and basal ganglia posterior to a chronic infarct, and there is also a small acute infarct along the lateral margin of the right thalamus. There is ex vacuo dilatation of the frontal horn of the left lateral ventricle. A few scattered small chronic cerebral hemorrhages are unchanged. T2 hyperintensities in the cerebral white matter bilaterally have slightly progressed from the prior MRI and are nonspecific but compatible with mild chronic small vessel ischemic  disease. No mass, midline shift, or extra-axial fluid collection is identified. There is moderate cerebral and cerebellar atrophy. Vascular: Major intracranial vascular flow voids are preserved. Skull and upper cervical spine: Unremarkable bone marrow signal. Sinuses/Orbits: Unremarkable orbits. Minimal mucosal thickening in the paranasal sinuses. Clear paranasal sinuses. Other: Postoperative changes to the right parietal scalp. IMPRESSION: 1. Small acute infarcts in the left basal ganglia/corona radiata and right thalamus. 2. Chronic small vessel ischemia and moderate cerebral atrophy. Electronically Signed   By: Logan Bores M.D.   On: 05/28/2019 15:41   VAS [place] LOWER EXTREMITY VENOUS (DVT)  Result Date: 05/29/2019  Lower Venous DVTStudy Indications: Stroke.  Risk Factors: DVT. Anticoagulation: Heparin. Comparison Study: 07/18/2018 - Right chronic DVT CFV, FV, PopV Performing Technologist: Oliver Hum RVT  Examination Guidelines: A complete evaluation includes B-mode imaging, spectral Doppler, color Doppler, and power Doppler as needed of all accessible portions of each vessel. Bilateral testing is considered an integral part of a complete examination. Limited examinations for reoccurring indications may be performed as noted. The reflux portion of the exam is performed with the patient in reverse Trendelenburg.  +---------+---------------+---------+-----------+----------+-----------------+ RIGHT    CompressibilityPhasicitySpontaneityPropertiesThrombus Aging    +---------+---------------+---------+-----------+----------+-----------------+ CFV      Full           Yes      Yes                                    +---------+---------------+---------+-----------+----------+-----------------+ SFJ      Full                                                           +---------+---------------+---------+-----------+----------+-----------------+  FV Prox  Partial        Yes      Yes                   Chronic           +---------+---------------+---------+-----------+----------+-----------------+ FV Mid   Partial        Yes      Yes                  Chronic           +---------+---------------+---------+-----------+----------+-----------------+ FV DistalPartial        Yes      Yes                  Chronic           +---------+---------------+---------+-----------+----------+-----------------+ PFV      Full                                                           +---------+---------------+---------+-----------+----------+-----------------+ POP      Full           No       No                                     +---------+---------------+---------+-----------+----------+-----------------+ PTV      Full                                                           +---------+---------------+---------+-----------+----------+-----------------+ PERO     Full                                                           +---------+---------------+---------+-----------+----------+-----------------+ Gastroc  None                                         Age Indeterminate +---------+---------------+---------+-----------+----------+-----------------+   +---------+---------------+---------+-----------+----------+--------------+ LEFT     CompressibilityPhasicitySpontaneityPropertiesThrombus Aging +---------+---------------+---------+-----------+----------+--------------+ CFV      Full           Yes      Yes                                 +---------+---------------+---------+-----------+----------+--------------+ SFJ      Full                                                        +---------+---------------+---------+-----------+----------+--------------+ FV Prox  Full                                                        +---------+---------------+---------+-----------+----------+--------------+  FV Mid   Full                                                         +---------+---------------+---------+-----------+----------+--------------+ FV DistalFull                                                        +---------+---------------+---------+-----------+----------+--------------+ PFV      Full                                                        +---------+---------------+---------+-----------+----------+--------------+ POP      Full           Yes      Yes                                 +---------+---------------+---------+-----------+----------+--------------+ PTV      Full                                                        +---------+---------------+---------+-----------+----------+--------------+ PERO     Full                                                        +---------+---------------+---------+-----------+----------+--------------+     Summary: RIGHT: - Findings consistent with age indeterminate deep vein thrombosis involving the right gastrocnemius veins. - Findings consistent with chronic deep vein thrombosis involving the right femoral vein. - No cystic structure found in the popliteal fossa.  LEFT: - There is no evidence of deep vein thrombosis in the lower extremity.  - No cystic structure found in the popliteal fossa.  *See table(s) above for measurements and observations.    Preliminary     PHYSICAL EXAM  Pulse Rate:  [63-103] 86 (04/22 1400) Resp:  [14-25] 22 (04/22 1400) BP: (137-177)/(54-155) 158/99 (04/22 1400) SpO2:  [94 %-99 %] 97 % (04/22 1400)  General - Well nourished, well developed, in no apparent distress.  Ophthalmologic - fundi not visualized due to noncooperation.  Cardiovascular - irregular heart rhythm, frequent PVCs and VTs on tele.  Neuro - awake alert, orientated to self, age and place but not to time. Severe dysarthria but able to name and repeat, able to follow simple commands, paucity of speech. PERRL, EOMI, visual field full. Right facial droop. Tongue  midline. LUE and BLEs 4/5, RUE 3/5 with drift. Sensation symmetrical as per pt, left FTN slow but intact. Gait not tested   ASSESSMENT/PLAN William Mann is a 86 y.o. male with history of  R ICA stenosis s/p R CEA, PVD, hypertension, hyperlipidemia, lower extremity  DVT on Eliquis, carotid artery occlusion, atherosclerosis and bilateral hearing loss presenting with dysarthria, facial droop, R leg weakness.   Stroke:   L BG/CR and R PLIC/thalamic infarcts secondary to large vessel disease source    CT head No acute abnormality. Small vessel disease. Atrophy.   MRI  Small L basal ganglia / corona radiata and R thalamic infarcts. Small vessel disease. Atrophy.   CTA head & neck no LVO. L ICA plaque w/ 50% stenosis. R CEA patent. R>L supraclinoid ICA and B PCA moderate to severe stenoses.   LE Doppler  R gastrocnemius veing indeterminate thrombosis. R femoral w/ chronic DVT.  2D Echo pending  LDL 160  HgbA1c 5.0  Heparin for VTE prophylaxis    interrogate [employer]'s PPM to rule out afib - pending   aspirin 81 mg daily and Eliquis (apixaban) daily prior to admission, now on aspirin 300 mg suppository daily and heparin IV. Recommend to resume eliquis and ASA 81 once po access  Therapy recommendations:  pending   Disposition:  pending   Intracranial stenosis   CTA head and neck - R>L supraclinoid ICA and B PCA moderate to severe stenoses.   Continue eliquis and ASA once po access  Avoid low BP  Long term BP goal 120-150   RLE DVT   on Xarelto initially, switched to Eliquis one year ago  LE Doppler this admission - R gastrocnemius veing indeterminate thrombosis. R femoral w/ chronic DVT.  Now on IV heparin  Recommend to resume eliquis once po access  Frequent PVCs and VTs  Frequent PVCs and VTs on tele  Discussed with Dr. Posey Pronto - will consider beta blockers  BP goal 120-150  Consider cardiology consult if needed  Hypertension  BP elevated on admission  at 177/155  Stable today . Permissive hypertension (OK if < 220/120) but gradually normalize in 2-3 days . Long-term BP goal 120-150  Hyperlipidemia  Home meds:  zocor 10  Now on zocor 40  LDL 160, goal < 70  Continue statin at discharge  Other Stroke Risk Factors  Advanced age  Family hx stroke (brother)  Coronary artery disease  PVD  Hx R ICA stenosis s/p R CEA  PPM  Other Active Problems  COVID-19 infection 3 mos ago, recovered - this admission neg  BPH on flomax  Hospital day # 1  Neurology will sign off. Please call with questions. Pt will follow up with stroke clinic NP at [facility] in about 4 weeks. Thanks for the consult.  Rosalin Hawking, MD PhD Stroke Neurology 05/29/2019 2:54 PM    To contact Stroke Continuity provider, please refer to [email or web address] After hours, contact General Neurology

## 2019-05-29 NOTE — ED Notes (Signed)
Pt transported to vascular.  °

## 2019-05-29 NOTE — Progress Notes (Addendum)
[facility] Progress Note  Patient: William Mann    [id number]  DOA: 05/28/2019     Date of Service: the patient was seen and examined on 05/29/2019  Chief complaint. Slurred speech  Brief hospital course: Past medical history of PVD, HTN, HLD, DVT on Eliquis, PPM implant, carotid stenosis SP right carotid endarterectomy, COVID-25 February 2019.  Presents with slurred speech and facial droop as well as recurrent fall.  MRI brain positive for small acute infarct in left basal ganglia and corona radiata as well as right thalamus.  Patient is on therapeutic anticoagulation with Eliquis.  Neurology consulted. Currently further plan is further stroke work-up.  Assessment and Plan: 1.  Bilateral CVA Dysarthria Neurology consulted. Out of the window.  For TPA. Already on anticoagulation prior to admission. 2D echo currently pending. [place] pacemaker interrogation currently pending. Continuing 81 mg aspirin and Eliquis once p.o. intake is cleared by speech therapy. Neurology suspecting supraclinoid ICA stenosis as a potential etiology for which we will avoid lower BP.  Long-term blood pressure goal 1 [id number] systolic.  2.  Recurrent fall Secondary to stroke. PT OT consulted. Recommending CIR. No acute fractures.  3.  History of DVT June 2020 Currently on IV heparin. Transition to Eliquis once speech clears the patient for p.o. intake.  4.  Symptomatic bradycardia SP PPM implant Recurrent PVC NSVT Schedule Lopressor. Monitor for hypotension in the setting of acute stroke.  5.  BPH Flomax ordered but currently holding due to n.p.o. status.  6.  HLD Simvastatin ordered 40 mg.  Currently holding due to n.p.o. status.  7.  LVH Echocardiogram currently pending.  8.  Macrocytosis H&H relatively stable.  Monitor.  9.  Metabolic acidosis. Likely from p.o. intake. Lactic acid normal. ABG shows no hypercarbia. No acidosis as well on ABG. With patient's frequent  PVCs will give 1 amp of bicarb.  Monitor.  Changing to progressive for close observation.  Diet: NPO.  MBS tomorrow. DVT Prophylaxis: Therapeutic Anticoagulation with Hypertension   Advance goals of care discussion: Full code  Family Communication: family was present at bedside, at the time of interview.  The pt provided permission to discuss medical plan with the family. Opportunity was given to ask question and all questions were answered satisfactorily.   Disposition:  Status is: Inpatient  Remains inpatient appropriate because:Ongoing diagnostic testing needed not appropriate for outpatient work up   Dispo: The patient is from: ALF              Anticipated d/c is to: SNF              Anticipated d/c date is: 2 days              Patient currently is not medically stable to d/c.    Subjective: Denies any acute complaint.  Had some headache before but none right now.  No nausea no vomiting.  Physical Exam: General:  alert oriented to time and place.  Appear in moderate distress, affect flat in affect Eyes: PERRL ENT: Oral Mucosa Clear, dry  Neck: difficult to assess  JVD,  Cardiovascular: S1 and S2 Present, no Murmur,  Respiratory: good respiratory effort, Bilateral Air entry equal and Decreased, no Crackles, no wheezes Abdomen: Bowel Sound present, Soft and no tenderness,  Skin: no rash Extremities: no Pedal edema, no calf tenderness Neurologic: Dysarthria, right upper extremity weakness, abnormal past-pointing on right Gait not checked due to patient safety concerns  Vitals:   05/29/19 0430 05/29/19  0500 05/29/19 0530 05/29/19 0600  BP: (!) 137/94 (!) 159/75 140/85 (!) 153/94  Pulse: (!) 101 69 68   Resp: (!) 25 (!) 23 (!) 23 19  Temp:      TempSrc:      SpO2: 99% 97% 97%    No intake or output data in the 24 hours ending 05/29/19 0733 There were no vitals filed for this visit.  Data Reviewed: I have personally reviewed and interpreted daily labs, tele strips,  imagings as discussed above. I reviewed all nursing notes, pharmacy notes, vitals, pertinent old records I have discussed plan of care as described above with RN and patient/family.  CBC: Recent Labs  Lab 05/27/19 1629 05/28/19 1154 05/28/19 1216  WBC 6.6 9.1  --   NEUTROABS  --  7.3  --   HGB 14.3 15.2 15.0  HCT 43.6 45.9 44.0  MCV 103.6* 104.1*  --   PLT 160 174  --    Basic Metabolic Panel: Recent Labs  Lab 05/27/19 1629 05/28/19 1154 05/28/19 1216  NA 141 141 142  K 3.8 3.9 3.8  CL 110 111 110  CO2 22 22  --   GLUCOSE 135* 104* 98  BUN 17 14 15   CREATININE 0.98 0.85 0.70  CALCIUM 9.2 8.7*  --     Studies: CT ANGIO HEAD W OR WO CONTRAST  Result Date: 05/28/2019 CLINICAL DATA:  Follow-up examination for acute stroke. EXAM: CT ANGIOGRAPHY HEAD AND NECK TECHNIQUE: Multidetector CT imaging of the head and neck was performed using the standard protocol during bolus administration of intravenous contrast. Multiplanar CT image reconstructions and MIPs were obtained to evaluate the vascular anatomy. Carotid stenosis measurements (when applicable) are obtained utilizing NASCET criteria, using the distal internal carotid diameter as the denominator. CONTRAST:  123mL OMNIPAQUE IOHEXOL 350 MG/ML SOLN COMPARISON:  Prior CT and MRI from earlier the same day. FINDINGS: CTA NECK FINDINGS Aortic arch: Visualized aortic arch a within normal limits for caliber. Bovine branch pattern with common origin of the right brachiocephalic and left common carotid artery noted. Mild atheromatous plaque within the arch itself. No hemodynamically significant stenosis about the origin of the great vessels. Visualized subclavian arteries patent without flow-limiting stenosis. Right carotid system: Right CCA tortuous proximally but is widely patent to the bifurcation without stenosis. Mixed eccentric plaque about the right bifurcation without hemodynamically significant stenosis. Probable changes related to  previous right carotid endarterectomy. Right ICA widely patent distally to the skull base without stenosis, dissection, or occlusion eccentric plaque at the cervical petrous junction at the skull base with short-segment mild stenosis noted (series 6, image 225). Calcified atherosclerotic plaque seen within this region on prior noncontrast head CT. Left carotid system: Left CCA tortuous proximally but is widely patent to the bifurcation without significant stenosis. Bulky calcified plaque about the left bifurcation/proximal left ICA with associated stenosis of up to 50% by NASCET criteria. Left ICA mildly tortuous but otherwise widely patent distally to the skull base without stenosis, dissection or occlusion. Vertebral arteries: Both vertebral arteries arise from the subclavian arteries. Focal plaque at the origin of the right vertebral artery with mild ostial stenosis. Left vertebral artery dominant. Vertebral arteries mildly tortuous but otherwise widely patent within the neck without stenosis, dissection or occlusion. Skeleton: No visible acute osseous abnormality. No worrisome lytic or blastic osseous lesions. Dens is somewhat angulated, nonspecific, but could be related to remotely healed trauma. Moderate cervical spondylosis present at C5-6 and C6-7. Other neck: No other acute soft  tissue abnormality within the neck. No mass lesion or adenopathy. 7 mm right thyroid nodule noted, of doubtful significance given size and patient age. No follow-up imaging recommended regarding this lesion. Upper chest: Visualized upper chest demonstrates no acute finding. Scattered predominantly subpleural reticular linear densities could reflect atelectasis, scarring, or possibly fibrosis. Left-sided pacemaker/AICD noted. Review of the MIP images confirms the above findings CTA HEAD FINDINGS Anterior circulation: Petrous segments widely patent. Scattered atheromatous plaque throughout the cavernous/supraclinoid ICAs bilaterally.  Associated short-segment severe stenosis seen at the supraclinoid right ICA (series 6, image 265). Moderate to severe short-segment stenosis present at the para clinoid left ICA as well (series 6, image 263). ICA termini well perfused. A1 segments patent bilaterally. Normal anterior communicating artery complex. Anterior cerebral arteries patent to their distal aspects without stenosis. No M1 stenosis or occlusion. Normal MCA bifurcations. Distal MCA branches well perfused and symmetric. Posterior circulation: Dominant left vertebral artery widely patent to the vertebrobasilar junction without stenosis. Mild atheromatous irregularity within the right vertebral artery without stenosis. Both picas patent. Basilar widely patent to its distal aspect without stenosis. Superior cerebral arteries patent bilaterally. Both PCAs primarily supplied via the basilar. Multifocal moderate to severe bilateral P2 stenoses noted. PCAs remain well perfused to their distal aspects. Venous sinuses: Patent allowing for timing the contrast bolus. Anatomic variants: None significant. 2 mm focal outpouching arising from the supraclinoid left ICA demonstrates a tiny vessel extending from its apex, felt to be most consistent with a vascular infundibulum related to a hypoplastic left posterior communicating artery. No visible aneurysm. Review of the MIP images confirms the above findings IMPRESSION: 1. Negative CTA for large vessel occlusion. 2. Bulky atheromatous plaque about the left carotid bifurcation with associated 50% stenosis by NASCET criteria. 3. Prior right carotid endarterectomy without significant residual or recurrent stenosis. 4. Moderate to severe bilateral stenoses involving the supraclinoid ICAs bilaterally, right worse than left. 5. Moderate to severe multifocal bilateral P2 stenoses. Electronically Signed   By: Jeannine Boga M.D.   On: 05/28/2019 22:23   DG Chest 2 View  Result Date: 05/28/2019 CLINICAL DATA:   Cough.  Chest pain.  Hypertension. EXAM: CHEST - 2 VIEW COMPARISON:  02/28/2019 FINDINGS: Pacer with leads at right atrium and right ventricle. No lead discontinuity. Midline trachea. Borderline cardiomegaly. Atherosclerosis in the transverse aorta. No pleural effusion or pneumothorax. No congestive failure. Mild left base scarring. IMPRESSION: No acute cardiopulmonary disease. Aortic Atherosclerosis (ICD10-I70.0). Electronically Signed   By: Abigail Miyamoto M.D.   On: 05/28/2019 13:55   DG Lumbar Spine Complete  Result Date: 05/28/2019 CLINICAL DATA:  Fall. EXAM: LUMBAR SPINE - COMPLETE 4+ VIEW COMPARISON:  October 08, 2014. FINDINGS: Diffuse osteopenia is noted. Moderate degenerative disc disease is noted at L1-2 and L2-3. Minimal retrolisthesis of L2-3 is noted. Mild degenerative disc disease is noted at L5-S1. No fracture is noted. IMPRESSION: Multilevel degenerative disc disease. No acute abnormality seen in the lumbar spine. Electronically Signed   By: Marijo Conception M.D.   On: 05/28/2019 16:44   CT Head Wo Contrast  Result Date: 05/28/2019 CLINICAL DATA:  Altered mental status, ataxia EXAM: CT HEAD WITHOUT CONTRAST TECHNIQUE: Contiguous axial images were obtained from the base of the skull through the vertex without intravenous contrast. COMPARISON:  08/02/2005, 06/04/2014 FINDINGS: Brain: No evidence of acute infarction, hemorrhage, hydrocephalus, extra-axial collection or mass lesion/mass effect. Focal encephalomalacia the left basal ganglia related to prior lacunar infarct. Scattered low-density changes within the periventricular and subcortical white matter  compatible with chronic microvascular ischemic change. Moderate diffuse cerebral volume loss. Vascular: Atherosclerotic calcifications involving the large vessels of the skull base. No unexpected hyperdense vessel. Skull: Normal. Negative for fracture or focal lesion. Sinuses/Orbits: No acute finding. Other: None. IMPRESSION: 1.  No acute  intracranial findings. 2.  Chronic microvascular ischemic change and cerebral volume loss. Electronically Signed   By: Davina Poke D.O.   On: 05/28/2019 12:19   CT ANGIO NECK W OR WO CONTRAST  Result Date: 05/28/2019 CLINICAL DATA:  Follow-up examination for acute stroke. EXAM: CT ANGIOGRAPHY HEAD AND NECK TECHNIQUE: Multidetector CT imaging of the head and neck was performed using the standard protocol during bolus administration of intravenous contrast. Multiplanar CT image reconstructions and MIPs were obtained to evaluate the vascular anatomy. Carotid stenosis measurements (when applicable) are obtained utilizing NASCET criteria, using the distal internal carotid diameter as the denominator. CONTRAST:  124mL OMNIPAQUE IOHEXOL 350 MG/ML SOLN COMPARISON:  Prior CT and MRI from earlier the same day. FINDINGS: CTA NECK FINDINGS Aortic arch: Visualized aortic arch a within normal limits for caliber. Bovine branch pattern with common origin of the right brachiocephalic and left common carotid artery noted. Mild atheromatous plaque within the arch itself. No hemodynamically significant stenosis about the origin of the great vessels. Visualized subclavian arteries patent without flow-limiting stenosis. Right carotid system: Right CCA tortuous proximally but is widely patent to the bifurcation without stenosis. Mixed eccentric plaque about the right bifurcation without hemodynamically significant stenosis. Probable changes related to previous right carotid endarterectomy. Right ICA widely patent distally to the skull base without stenosis, dissection, or occlusion eccentric plaque at the cervical petrous junction at the skull base with short-segment mild stenosis noted (series 6, image 225). Calcified atherosclerotic plaque seen within this region on prior noncontrast head CT. Left carotid system: Left CCA tortuous proximally but is widely patent to the bifurcation without significant stenosis. Bulky calcified  plaque about the left bifurcation/proximal left ICA with associated stenosis of up to 50% by NASCET criteria. Left ICA mildly tortuous but otherwise widely patent distally to the skull base without stenosis, dissection or occlusion. Vertebral arteries: Both vertebral arteries arise from the subclavian arteries. Focal plaque at the origin of the right vertebral artery with mild ostial stenosis. Left vertebral artery dominant. Vertebral arteries mildly tortuous but otherwise widely patent within the neck without stenosis, dissection or occlusion. Skeleton: No visible acute osseous abnormality. No worrisome lytic or blastic osseous lesions. Dens is somewhat angulated, nonspecific, but could be related to remotely healed trauma. Moderate cervical spondylosis present at C5-6 and C6-7. Other neck: No other acute soft tissue abnormality within the neck. No mass lesion or adenopathy. 7 mm right thyroid nodule noted, of doubtful significance given size and patient age. No follow-up imaging recommended regarding this lesion. Upper chest: Visualized upper chest demonstrates no acute finding. Scattered predominantly subpleural reticular linear densities could reflect atelectasis, scarring, or possibly fibrosis. Left-sided pacemaker/AICD noted. Review of the MIP images confirms the above findings CTA HEAD FINDINGS Anterior circulation: Petrous segments widely patent. Scattered atheromatous plaque throughout the cavernous/supraclinoid ICAs bilaterally. Associated short-segment severe stenosis seen at the supraclinoid right ICA (series 6, image 265). Moderate to severe short-segment stenosis present at the para clinoid left ICA as well (series 6, image 263). ICA termini well perfused. A1 segments patent bilaterally. Normal anterior communicating artery complex. Anterior cerebral arteries patent to their distal aspects without stenosis. No M1 stenosis or occlusion. Normal MCA bifurcations. Distal MCA branches well perfused and  symmetric.  Posterior circulation: Dominant left vertebral artery widely patent to the vertebrobasilar junction without stenosis. Mild atheromatous irregularity within the right vertebral artery without stenosis. Both picas patent. Basilar widely patent to its distal aspect without stenosis. Superior cerebral arteries patent bilaterally. Both PCAs primarily supplied via the basilar. Multifocal moderate to severe bilateral P2 stenoses noted. PCAs remain well perfused to their distal aspects. Venous sinuses: Patent allowing for timing the contrast bolus. Anatomic variants: None significant. 2 mm focal outpouching arising from the supraclinoid left ICA demonstrates a tiny vessel extending from its apex, felt to be most consistent with a vascular infundibulum related to a hypoplastic left posterior communicating artery. No visible aneurysm. Review of the MIP images confirms the above findings IMPRESSION: 1. Negative CTA for large vessel occlusion. 2. Bulky atheromatous plaque about the left carotid bifurcation with associated 50% stenosis by NASCET criteria. 3. Prior right carotid endarterectomy without significant residual or recurrent stenosis. 4. Moderate to severe bilateral stenoses involving the supraclinoid ICAs bilaterally, right worse than left. 5. Moderate to severe multifocal bilateral P2 stenoses. Electronically Signed   By: Jeannine Boga M.D.   On: 05/28/2019 22:23   MR BRAIN WO CONTRAST  Result Date: 05/28/2019 CLINICAL DATA:  Ataxia, stroke suspected. EXAM: MRI HEAD WITHOUT CONTRAST TECHNIQUE: Multiplanar, multiecho pulse sequences of the brain and surrounding structures were obtained without intravenous contrast. COMPARISON:  Head CT 05/28/2019 and MRI 06/04/2014 FINDINGS: Brain: There is a small acute infarct involving the left corona radiata and basal ganglia posterior to a chronic infarct, and there is also a small acute infarct along the lateral margin of the right thalamus. There is ex  vacuo dilatation of the frontal horn of the left lateral ventricle. A few scattered small chronic cerebral hemorrhages are unchanged. T2 hyperintensities in the cerebral white matter bilaterally have slightly progressed from the prior MRI and are nonspecific but compatible with mild chronic small vessel ischemic disease. No mass, midline shift, or extra-axial fluid collection is identified. There is moderate cerebral and cerebellar atrophy. Vascular: Major intracranial vascular flow voids are preserved. Skull and upper cervical spine: Unremarkable bone marrow signal. Sinuses/Orbits: Unremarkable orbits. Minimal mucosal thickening in the paranasal sinuses. Clear paranasal sinuses. Other: Postoperative changes to the right parietal scalp. IMPRESSION: 1. Small acute infarcts in the left basal ganglia/corona radiata and right thalamus. 2. Chronic small vessel ischemia and moderate cerebral atrophy. Electronically Signed   By: Logan Bores M.D.   On: 05/28/2019 15:41    Scheduled Meds: .  stroke: mapping our early stages of recovery book   Does not apply Once  . aspirin  300 mg Rectal Daily  . simvastatin  40 mg Oral QHS  . tamsulosin  0.4 mg Oral Daily   Continuous Infusions: . sodium chloride 100 mL/hr at 05/28/19 1954  . heparin 1,200 Units/hr (05/29/19 0416)   PRN Meds: acetaminophen **OR** acetaminophen (TYLENOL) oral liquid 160 mg/5 mL **OR** acetaminophen, labetalol, mineral oil  Time spent: 35 minutes  Author: Berle Mull, MD [facility] 05/29/2019 7:33 AM  To reach On-call, see care teams to locate the attending and reach out to them via [email or web address]. If 7PM-7AM, please contact night-coverage If you still have difficulty reaching the attending provider, please page the [facility] (Director on Call) for [facility] on amion for assistance.

## 2019-05-29 NOTE — ED Notes (Signed)
Pt at bedside

## 2019-05-29 NOTE — Evaluation (Signed)
Physical Therapy Evaluation Patient Details Name: William Mann MRN: [id number] DOB: Apr 27, 1929 Today's Date: 05/29/2019   History of Present Illness  [name] is a 86 y.o. male with history of  R ICA stenosis s/p R CEA, PVD, hypertension, hyperlipidemia, lower extremity DVT on Eliquis, carotid artery occlusion, atherosclerosis and bilateral hearing loss presenting with dysarthria, facial droop, R leg weakness.  Found to have L BG/CR and R PLIC/thalamic infarcts.  Clinical Impression  Patient presents with decreased independence with mobility needing mod to max A today for in room mobility at the bedside.  Typically he can walk with an assistive device on his own and needs some help for BADL's.  He has R UE weakness and decreased coordination, balance, safety awareness and decreased cognition limiting mobility, safety and independence.  He will benefit from skilled PT In the acute setting and follow up CIR level rehab upon d/c.     Follow Up Recommendations CIR    Equipment Recommendations  None recommended by PT    Recommendations for Other Services Rehab consult     Precautions / Restrictions Precautions Precautions: Fall      Mobility  Bed Mobility Overal bed mobility: Needs Assistance Bed Mobility: Supine to Sit;Sit to Supine     Supine to sit: Max assist Sit to supine: Mod assist   General bed mobility comments: assist to initiate to bring legs off bed, assist for lifting trunk upright, to supine, pt lowered trunk, assist for legs and positioning in supine  Transfers Overall transfer level: Needs assistance Equipment used: Rolling walker (2 wheeled) Transfers: Sit to/from Stand Sit to Stand: Mod assist;From elevated surface         General transfer comment: from stretcher in the ED, assist to lift to stand, then for support for balance  Ambulation/Gait Ambulation/Gait assistance: Mod assist;Max assist Gait Distance (Feet): 2 Feet Assistive  device: Rolling walker (2 wheeled) Gait Pattern/deviations: Step-to pattern;Decreased stride length;Staggering right;Shuffle;Decreased weight shift to left     General Gait Details: leaning R and assist for L weight shift to move R LE, assist for balance and walker manipulation  Stairs            Wheelchair Mobility    Modified Rankin (Stroke Patients Only) Modified Rankin (Stroke Patients Only) Pre-Morbid Rankin Score: Moderate disability Modified Rankin: Moderately severe disability     Balance Overall balance assessment: Needs assistance Sitting-balance support: Feet supported;Single extremity supported Sitting balance-Leahy Scale: Poor Sitting balance - Comments: leaning R Postural control: Right lateral lean Standing balance support: Bilateral upper extremity supported Standing balance-Leahy Scale: Poor Standing balance comment: mod A for balance with walker                             Pertinent Vitals/Pain Pain Assessment: No/denies pain Faces Pain Scale: (P) Hurts a little bit Pain Intervention(s): (P) Monitored during session    Home Living Family/patient expects to be discharged to:: Private residence Living Arrangements: Spouse/significant other;Other relatives(grandson) Available Help at Discharge: Family Type of Home: House Home Access: Level entry     Home Layout: One level Home Equipment: Cane - single point;Shower seat;Walker - 2 wheels      Prior Function Level of Independence: Needs assistance   Gait / Transfers Assistance Needed: used walker or cane to ambulate in the home  ADL's / Homemaking Assistance Needed: toileted sometimes on his own, wife helps sometimes and for showers  Hand Dominance        Extremity/Trunk Assessment   Upper Extremity Assessment Upper Extremity Assessment: RUE deficits/detail RUE Deficits / Details: positive drift when lifted, strength 3+/5    Lower Extremity Assessment Lower  Extremity Assessment: Overall WFL for tasks assessed    Cervical / Trunk Assessment Cervical / Trunk Assessment: Kyphotic  Communication   Communication: HOH  Cognition Arousal/Alertness: Lethargic Behavior During Therapy: Flat affect Overall Cognitive Status: Impaired/Different from baseline Area of Impairment: Orientation;Attention;Safety/judgement;Following commands                 Orientation Level: Disoriented to;Place;Time;Situation Current Attention Level: Focused   Following Commands: Follows one step commands with increased time Safety/Judgement: Decreased awareness of deficits            General Comments General comments (skin integrity, edema, etc.): son and wife in the room    Exercises     Assessment/Plan    PT Assessment Patient needs continued PT services  PT Problem List Decreased strength;Decreased activity tolerance;Decreased mobility;Decreased cognition;Decreased coordination;Decreased balance;Decreased knowledge of use of DME       PT Treatment Interventions DME instruction;Therapeutic activities;Balance training;Therapeutic exercise;Functional mobility training;Gait training;Patient/family education    PT Goals (Current goals can be found in the Care Plan section)  Acute Rehab PT Goals Patient Stated Goal: to go to rehab PT Goal Formulation: With patient/family Time For Goal Achievement: 06/12/19 Potential to Achieve Goals: Fair    Frequency Min 4X/week   Barriers to discharge        Co-evaluation               AM-PAC PT "6 Clicks" Mobility  Outcome Measure Help needed turning from your back to your side while in a flat bed without using bedrails?: A Lot Help needed moving from lying on your back to sitting on the side of a flat bed without using bedrails?: Total Help needed moving to and from a bed to a chair (including a wheelchair)?: A Lot Help needed standing up from a chair using your arms (e.g., wheelchair or bedside  chair)?: A Lot Help needed to walk in hospital room?: A Lot Help needed climbing 3-5 steps with a railing? : Total 6 Click Score: 10    End of Session   Activity Tolerance: Patient limited by fatigue Patient left: in bed;with family/visitor present Nurse Communication: Mobility status PT Visit Diagnosis: Other abnormalities of gait and mobility (R26.89);Muscle weakness (generalized) (M62.81);Other symptoms and signs involving the nervous system (R29.898)    Time: [id number] PT Time Calculation (min) (ACUTE ONLY): 28 min   Charges:   PT Evaluation $PT Eval Moderate Complexity: 1 Mod PT Treatments $Therapeutic Activity: 8-22 mins        Magda Kiel, [place] [facility] [phone] 05/29/2019   Reginia Naas 05/29/2019, 4:36 PM

## 2019-05-29 NOTE — ED Notes (Signed)
ECHO at bedside.

## 2019-05-29 NOTE — Evaluation (Signed)
Clinical/Bedside Swallow Evaluation Patient Details  Name: William Mann MRN: [id number] Date of Birth: [birth year]  Today's Date: 05/29/2019 Time: SLP Start Time (ACUTE ONLY): 14 SLP Stop Time (ACUTE ONLY): 1617 SLP Time Calculation (min) (ACUTE ONLY): 21 min  Past Medical History:  Past Medical History:  Diagnosis Date  . Aortic atherosclerosis ([employer])   . Bilateral hearing loss   . BPH (benign prostatic hyperplasia)   . Cancer ([place])    basal cell carcinoma  . Carotid artery occlusion   . Constipation   . Coronary artery disease   . DVT (deep venous thrombosis) ([place])   . Emphysema lung ([place])   . Fatty liver   . GERD (gastroesophageal reflux disease)   . History of echocardiogram    Echo 4/17: Vigorous LVEF, EF 65-70%, normal wall motion, grade 1 diastolic dysfunction, mildly dilated ascending aorta (40 mm)  . Hyperlipidemia   . Hypertension   . Insomnia   . PVD (peripheral vascular disease) ([place])   . Thyroid nodule    Past Surgical History:  Past Surgical History:  Procedure Laterality Date  . APPENDECTOMY    . CAROTID ENDARTERECTOMY  03-05-09   right CEA  . COLONOSCOPY  Oct. 2013   one polyp  . HERNIA REPAIR    . LIPOMA EXCISION    . PACEMAKER IMPLANT N/A 12/23/2018   Procedure: PACEMAKER IMPLANT;  Surgeon: Evans Lance, MD;  Location: [facility];  Service: Cardiovascular;  Laterality: N/A;   HPI:  William Mann is a 86 y.o. male with medical history significant of thyroid nodule, emphysema, GERD, right carotid endarterectomy,PVD, hypertension, hyperlipidemia, lower extremity DVT, bilateral hearing loss, presented with new onset of slurred speech and facial droop. Per chart wife also noticed significant right leg weakness, right-sided facial droop, and cough/choking after eating drink since last night. MRI shows small acute infarcts in the left basal ganglia/corona radiata and right thalamus. CXR 4/21 No acute cardiopulmonary disease   Assessment  / Plan / Recommendation Clinical Impression  Pt exhibits pharyngeal dysphagia and suspected laryngeal penetration and/or aspiration. + dysarthric speech with labial/lingual imprecision and lower intensity. Cranial nerve VII involvement resulting in right facial flacidity. Suspect poor oral control with decreased laryngeal protection indicated by immediate and strong throat clear with thin. Suspect cough reflex diminimshed as it appeared he needed to cough strongly and only produced weak throat clear. He has not had po's since Monday evening. Recommend pt have small teaspoon sips water, separate trials ice chips after oral care every 2-3 hours as desired and plan for MBS tomorrow morning.           Aspiration Risk       Diet Recommendation          Other  Recommendations     Follow up Recommendations        Frequency and Duration            Prognosis        Swallow Study   General Date of Onset: 05/28/19 HPI: William Mann is a 86 y.o. male with medical history significant of thyroid nodule, emphysema, GERD, right carotid endarterectomy,PVD, hypertension, hyperlipidemia, lower extremity DVT, bilateral hearing loss, presented with new onset of slurred speech and facial droop. Per chart wife also noticed significant right leg weakness, right-sided facial droop, and cough/choking after eating drink since last night. MRI shows small acute infarcts in the left basal ganglia/corona radiata and right thalamus. CXR 4/21 No acute cardiopulmonary disease Type  of Study: Bedside Swallow Evaluation Previous Swallow Assessment: (none) Diet Prior to this Study: NPO Temperature Spikes Noted: No Respiratory Status: Room air History of Recent Intubation: No Behavior/Cognition: Lethargic/Drowsy;Cooperative;Pleasant mood Oral Cavity Assessment: Dry Oral Care Completed by SLP: Yes Oral Cavity - Dentition: Adequate natural dentition Vision: Functional for self-feeding Self-Feeding Abilities:  Needs assist Patient Positioning: Upright in bed Baseline Vocal Quality: Low vocal intensity Volitional Cough: Weak    Oral/Motor/Sensory Function Overall Oral Motor/Sensory Function: Moderate impairment Facial ROM: Reduced right;Suspected CN VII (facial) dysfunction Facial Symmetry: Abnormal symmetry right;Suspected CN VII (facial) dysfunction Facial Strength: Reduced right;Suspected CN VII (facial) dysfunction Facial Sensation: Reduced right;Suspected CN V (Trigeminal) dysfunction   Ice Chips Ice chips: Impaired Presentation: Spoon Oral Phase Impairments: Reduced lingual movement/coordination Oral Phase Functional Implications: Prolonged oral transit   Thin Liquid Thin Liquid: Impaired Oral Phase Impairments: Reduced labial seal Oral Phase Functional Implications: Right anterior spillage Pharyngeal  Phase Impairments: Throat Clearing - Immediate;Other (comments);Decreased hyoid-laryngeal movement    Nectar Thick Nectar Thick Liquid: Not tested   Honey Thick Honey Thick Liquid: Not tested   Puree Puree: Impaired Pharyngeal Phase Impairments: Decreased hyoid-laryngeal movement   Solid     Solid: Not tested      Houston Siren 05/29/2019,4:42 PM  Orbie Pyo Colvin Caroli.Ed Risk analyst [phone] Office [phone]

## 2019-05-29 NOTE — Progress Notes (Signed)
[place] [place] for Heparin Indication: VTE Treatment on Apixaban PTA   Allergies  Allergen Reactions  . Atorvastatin Other (See Comments)    Liver inflammation    . Rosuvastatin Other (See Comments)    Muscle pain  . Viagra [Sildenafil] Other (See Comments)    Reaction ??    Patient Measurements:   Heparin Dosing Weight: 68 kg  Vital Signs: BP: 153/83 (04/22 0257) Pulse Rate: 103 (04/22 0257)  Labs: Recent Labs    05/27/19 1629 05/27/19 1629 05/28/19 1154 05/28/19 1216 05/28/19 2000 05/29/19 0256  HGB 14.3   < > 15.2 15.0  --   --   HCT 43.6  --  45.9 44.0  --   --   PLT 160  --  174  --   --   --   APTT  --   --  31  --  30 54*  LABPROT 14.6  --  14.5  --   --   --   INR 1.1  --  1.1  --   --   --   HEPARINUNFRC  --   --   --   --  0.89* 0.75*  CREATININE 0.98  --  0.85 0.70  --   --    < > = values in this interval not displayed.    Estimated Creatinine Clearance: 61.4 mL/min (by C-G formula based on SCr of 0.7 mg/dL).   Medical History: Past Medical History:  Diagnosis Date  . Aortic atherosclerosis ([place])   . Bilateral hearing loss   . BPH (benign prostatic hyperplasia)   . Cancer ([place])    basal cell carcinoma  . Carotid artery occlusion   . Constipation   . Coronary artery disease   . DVT (deep venous thrombosis) ([place])   . Emphysema lung ([place])   . Fatty liver   . GERD (gastroesophageal reflux disease)   . History of echocardiogram    Echo 4/17: Vigorous LVEF, EF 65-70%, normal wall motion, grade 1 diastolic dysfunction, mildly dilated ascending aorta (40 mm)  . Hyperlipidemia   . Hypertension   . Insomnia   . PVD (peripheral vascular disease) ([place])   . Thyroid nodule     Medications:  Scheduled:  .  stroke: mapping our early stages of recovery book   Does not apply Once  . aspirin  300 mg Rectal Daily  . simvastatin  40 mg Oral QHS  . tamsulosin  0.4 mg Oral Daily    Assessment: Patient is a 50 yom  that presents to the ED after an abnormal MRI that showed small acute infarcts in the left basal ganglia. Patient was on Apixaban PTA for recent DVT. Neuro recommended to continue anticoagulation. The admitting team has requested that pharmacy dose Heparin at this time in place of the patient's home Apixaban.   4/22 AM update:  APTT below goal  Goal of Therapy:  Heparin level 0.3-0.5 units/mL APTT 66-84 secs Monitor platelets by anticoagulation protocol: Yes   Plan:  -Inc heparin to 1200 units/hr -1300 aPTT/HL -Daily CBC/aPTT/HL -Monitor for bleeding  Narda Bonds, PharmD, BCPS Clinical Pharmacist Phone: [phone]

## 2019-05-29 NOTE — Progress Notes (Signed)
[place] for Heparin Indication: VTE Treatment on Apixaban PTA   Allergies  Allergen Reactions  . Atorvastatin Other (See Comments)    Liver inflammation    . Rosuvastatin Other (See Comments)    Muscle pain  . Viagra [Sildenafil] Other (See Comments)    Reaction ??    Patient Measurements:   Heparin Dosing Weight: 68 kg  Vital Signs: BP: 157/89 (04/22 1300) Pulse Rate: 93 (04/22 1300)  Labs: Recent Labs    05/27/19 1629 05/27/19 1629 05/28/19 1154 05/28/19 1154 05/28/19 1216 05/28/19 2000 05/29/19 0256 05/29/19 1147 05/29/19 1255  HGB 14.3   < > 15.2   < > 15.0  --   --  15.5  --   HCT 43.6   < > 45.9  --  44.0  --   --  45.9  --   PLT 160  --  174  --   --   --   --  180  --   APTT  --   --  31   < >  --  30 54*  --  85*  LABPROT 14.6  --  14.5  --   --   --   --   --   --   INR 1.1  --  1.1  --   --   --   --   --   --   HEPARINUNFRC  --   --   --   --   --  0.89* 0.75*  --  1.05*  CREATININE 0.98  --  0.85  --  0.70  --   --   --   --    < > = values in this interval not displayed.    Estimated Creatinine Clearance: 61.4 mL/min (by C-G formula based on SCr of 0.7 mg/dL).   Medical History: Past Medical History:  Diagnosis Date  . Aortic atherosclerosis ([place])   . Bilateral hearing loss   . BPH (benign prostatic hyperplasia)   . Cancer ([place])    basal cell carcinoma  . Carotid artery occlusion   . Constipation   . Coronary artery disease   . DVT (deep venous thrombosis) ([place])   . Emphysema lung ([place])   . Fatty liver   . GERD (gastroesophageal reflux disease)   . History of echocardiogram    Echo 4/17: Vigorous LVEF, EF 65-70%, normal wall motion, grade 1 diastolic dysfunction, mildly dilated ascending aorta (40 mm)  . Hyperlipidemia   . Hypertension   . Insomnia   . PVD (peripheral vascular disease) ([place])   . Thyroid nodule     Medications:  Scheduled:  .  stroke: mapping our early stages of recovery  book   Does not apply Once  . aspirin  300 mg Rectal Daily  . simvastatin  40 mg Oral QHS  . tamsulosin  0.4 mg Oral Daily    Assessment: Patient is a 6 yom that presents to the ED after an abnormal MRI that showed small acute infarcts in the left basal ganglia. Patient was on Apixaban PTA for recent DVT. Neuro recommended to continue anticoagulation. The admitting team has requested that pharmacy dose Heparin at this time in place of the patient's home Apixaban. Dopplers show chronic DVT in R femoral vein.   APTT slightly above therapeutic range after increasing heparin drip rate to 1200 units/hr. Heparin level remains falsely elevated given recent Apixaban. Will continue to dose based off aPTT until levels  correlate. CBC wnl, no overt bleeding or infusion issues noted.  Goal of Therapy:  Heparin level 0.3-0.5 units/mL APTT 66-84 secs Monitor platelets by anticoagulation protocol: Yes   Plan:  Decrease heparin infusion to 1150 units/hr Check 8-hr aPTT Monitor daily aPTT, HL, CBC, s/sx bleeding  Richardine Service, PharmD PGY1 Pharmacy Resident Phone: [phone] 05/29/2019  1:30 PM  Please check [email or web address] for unit-specific pharmacy phone numbers.

## 2019-05-29 NOTE — ED Notes (Signed)
Pt transferred to hospital bed for comfort.

## 2019-05-29 NOTE — Progress Notes (Signed)
[place] for Heparin Indication: VTE Treatment on Apixaban PTA   Allergies  Allergen Reactions  . Atorvastatin Other (See Comments)    Liver inflammation    . Rosuvastatin Other (See Comments)    Muscle pain  . Viagra [Sildenafil] Other (See Comments)    Reaction ??    Patient Measurements:   Heparin Dosing Weight: 68 kg  Vital Signs: Temp: 98.1 F (36.7 C) (04/22 2103) Temp Source: Oral (04/22 1723) BP: 183/96 (04/22 2103) Pulse Rate: 92 (04/22 2103)  Labs: Recent Labs    05/27/19 1629 05/27/19 1629 05/28/19 1154 05/28/19 1154 05/28/19 1216 05/28/19 1216 05/28/19 2000 05/28/19 2000 05/29/19 0256 05/29/19 1147 05/29/19 1255 05/29/19 1537 05/29/19 2159  HGB 14.3   < > 15.2   < > 15.0   < >  --   --   --  15.5  --  15.3  --   HCT 43.6   < > 45.9   < > 44.0  --   --   --   --  45.9  --  45.0  --   PLT 160  --  174  --   --   --   --   --   --  180  --   --   --   APTT  --   --  31   < >  --   --  30   < > 54*  --  85*  --  75*  LABPROT 14.6  --  14.5  --   --   --   --   --   --   --   --   --   --   INR 1.1  --  1.1  --   --   --   --   --   --   --   --   --   --   HEPARINUNFRC  --   --   --   --   --   --  0.89*  --  0.75*  --  1.05*  --   --   CREATININE 0.98   < > 0.85  --  0.70  --   --   --   --  0.75  --   --   --    < > = values in this interval not displayed.    Estimated Creatinine Clearance: 61.4 mL/min (by C-G formula based on SCr of 0.75 mg/dL).   Medical History: Past Medical History:  Diagnosis Date  . Aortic atherosclerosis ([place])   . Bilateral hearing loss   . BPH (benign prostatic hyperplasia)   . Cancer ([place])    basal cell carcinoma  . Carotid artery occlusion   . Constipation   . Coronary artery disease   . DVT (deep venous thrombosis) ([place])   . Emphysema lung ([place])   . Fatty liver   . GERD (gastroesophageal reflux disease)   . History of echocardiogram    Echo 4/17: Vigorous LVEF, EF  65-70%, normal wall motion, grade 1 diastolic dysfunction, mildly dilated ascending aorta (40 mm)  . Hyperlipidemia   . Hypertension   . Insomnia   . PVD (peripheral vascular disease) ([place])   . Thyroid nodule     Medications:  Scheduled:  . aspirin  300 mg Rectal Daily  . metoprolol tartrate  2.5 mg Intravenous Q6H  . simvastatin  40 mg Oral q1800  . sodium bicarbonate  50 mEq Intravenous Once  . tamsulosin  0.4 mg Oral Daily    Assessment: Patient is a 66 yom that presents to the ED after an abnormal MRI that showed small acute infarcts in the left basal ganglia. Patient was on Apixaban PTA for recent DVT. Neuro recommended to continue anticoagulation. The admitting team has requested that pharmacy dose Heparin at this time in place of the patient's home Apixaban.   4/22 PM update:  APTT at goal  Goal of Therapy:  Heparin level 0.3-0.5 units/mL APTT 66-84 secs Monitor platelets by anticoagulation protocol: Yes   Plan:  -Cont heparin at 1150 units/hr -Confirmatory aPTT with AM labs -Monitor for bleeding  Narda Bonds, PharmD, BCPS Clinical Pharmacist Phone: [phone]

## 2019-05-30 ENCOUNTER — Inpatient Hospital Stay (HOSPITAL_COMMUNITY): Payer: Medicare Other

## 2019-05-30 ENCOUNTER — Encounter (HOSPITAL_COMMUNITY): Payer: Self-pay | Admitting: Internal Medicine

## 2019-05-30 DIAGNOSIS — I10 Essential (primary) hypertension: Secondary | ICD-10-CM

## 2019-05-30 DIAGNOSIS — I82409 Acute embolism and thrombosis of unspecified deep veins of unspecified lower extremity: Secondary | ICD-10-CM

## 2019-05-30 DIAGNOSIS — Z950 Presence of cardiac pacemaker: Secondary | ICD-10-CM

## 2019-05-30 DIAGNOSIS — I2510 Atherosclerotic heart disease of native coronary artery without angina pectoris: Secondary | ICD-10-CM

## 2019-05-30 DIAGNOSIS — R000 Tachycardia, unspecified: Secondary | ICD-10-CM

## 2019-05-30 DIAGNOSIS — I82491 Acute embolism and thrombosis of other specified deep vein of right lower extremity: Secondary | ICD-10-CM | POA: Diagnosis not present

## 2019-05-30 DIAGNOSIS — F0390 Unspecified dementia without behavioral disturbance: Secondary | ICD-10-CM

## 2019-05-30 DIAGNOSIS — I63233 Cerebral infarction due to unspecified occlusion or stenosis of bilateral carotid arteries: Secondary | ICD-10-CM | POA: Diagnosis not present

## 2019-05-30 DIAGNOSIS — K760 Fatty (change of) liver, not elsewhere classified: Secondary | ICD-10-CM

## 2019-05-30 DIAGNOSIS — D72829 Elevated white blood cell count, unspecified: Secondary | ICD-10-CM

## 2019-05-30 LAB — BASIC METABOLIC PANEL
Anion gap: 14 (ref 5–15)
BUN: 16 mg/dL (ref 8–23)
CO2: 20 mmol/L — ABNORMAL LOW (ref 22–32)
Calcium: 8.6 mg/dL — ABNORMAL LOW (ref 8.9–10.3)
Chloride: 105 mmol/L (ref 98–111)
Creatinine, Ser: 0.79 mg/dL (ref 0.61–1.24)
GFR calc Af Amer: >60 mL/min (ref >60)
GFR calc non Af Amer: >60 mL/min (ref >60)
Glucose, Bld: 115 mg/dL — ABNORMAL HIGH (ref 70–99)
Potassium: 3.6 mmol/L (ref 3.5–5.1)
Sodium: 139 mmol/L (ref 135–145)

## 2019-05-30 LAB — GLUCOSE, CAPILLARY
Glucose-Capillary: 109 mg/dL — ABNORMAL HIGH (ref 70–99)
Glucose-Capillary: 110 mg/dL — ABNORMAL HIGH (ref 70–99)
Glucose-Capillary: 97 mg/dL (ref 70–99)

## 2019-05-30 LAB — HEPARIN LEVEL (UNFRACTIONATED): Heparin Unfractionated: 0.79 IU/mL — ABNORMAL HIGH (ref 0.30–0.70)

## 2019-05-30 LAB — CBC
HCT: 46.6 % (ref 39.0–52.0)
Hemoglobin: 16.1 g/dL (ref 13.0–17.0)
MCH: 34.5 pg — ABNORMAL HIGH (ref 26.0–34.0)
MCHC: 34.5 g/dL (ref 30.0–36.0)
MCV: 100 fL (ref 80.0–100.0)
Platelets: 199 10*3/uL (ref 150–400)
RBC: 4.66 MIL/uL (ref 4.22–5.81)
RDW: 13.2 % (ref 11.5–15.5)
WBC: 15.8 10*3/uL — ABNORMAL HIGH (ref 4.0–10.5)
nRBC: 0 % (ref 0.0–0.2)

## 2019-05-30 LAB — APTT: aPTT: 73 seconds — ABNORMAL HIGH (ref 24–36)

## 2019-05-30 LAB — PROCALCITONIN: Procalcitonin: 0.11 ng/mL

## 2019-05-30 MED ORDER — SIMVASTATIN 20 MG PO TABS
40.0000 mg | ORAL_TABLET | Freq: Every day | ORAL | Status: DC
  Administered 2019-05-31 – 2019-06-19 (×19): 40 mg
  Filled 2019-05-30 (×19): qty 2

## 2019-05-30 MED ORDER — APIXABAN 5 MG PO TABS
5.0000 mg | ORAL_TABLET | Freq: Two times a day (BID) | ORAL | Status: DC
  Administered 2019-05-30 – 2019-06-10 (×20): 5 mg
  Filled 2019-05-30 (×19): qty 1

## 2019-05-30 MED ORDER — PRO-STAT SUGAR FREE PO LIQD
30.0000 mL | Freq: Every day | ORAL | Status: DC
  Administered 2019-05-31 – 2019-06-04 (×5): 30 mL
  Filled 2019-05-30 (×5): qty 30

## 2019-05-30 MED ORDER — ASPIRIN 81 MG PO CHEW
81.0000 mg | CHEWABLE_TABLET | Freq: Every day | ORAL | Status: DC
  Administered 2019-05-30 – 2019-06-10 (×11): 81 mg
  Filled 2019-05-30 (×11): qty 1

## 2019-05-30 MED ORDER — APIXABAN 5 MG PO TABS
5.0000 mg | ORAL_TABLET | Freq: Two times a day (BID) | ORAL | Status: DC
  Filled 2019-05-30: qty 1

## 2019-05-30 MED ORDER — SALINE SPRAY 0.65 % NA SOLN
1.0000 | Freq: Four times a day (QID) | NASAL | Status: DC
  Administered 2019-05-30 – 2019-06-25 (×92): 1 via NASAL
  Filled 2019-05-30: qty 44

## 2019-05-30 MED ORDER — ADULT MULTIVITAMIN LIQUID CH
15.0000 mL | Freq: Every day | ORAL | Status: DC
  Administered 2019-05-30 – 2019-06-18 (×18): 15 mL
  Filled 2019-05-30 (×18): qty 15

## 2019-05-30 MED ORDER — OSMOLITE 1.2 CAL PO LIQD
1000.0000 mL | ORAL | Status: AC
  Administered 2019-05-30 – 2019-06-15 (×13): 1000 mL
  Filled 2019-05-30 (×25): qty 1000

## 2019-05-30 MED ORDER — FREE WATER
110.0000 mL | Status: DC
  Administered 2019-05-30 – 2019-06-04 (×29): 110 mL

## 2019-05-30 NOTE — Progress Notes (Signed)
Physical Therapy Treatment Patient Details Name: William Mann MRN: [id number] DOB: March 24, 1929 Today's Date: 05/30/2019    History of Present Illness [name] is a 86 y.o. male with history of  R ICA stenosis s/p R CEA, PVD, hypertension, hyperlipidemia, lower extremity DVT on Eliquis, carotid artery occlusion, atherosclerosis and bilateral hearing loss presenting with dysarthria, facial droop, R leg weakness.  Found to have L BG/CR and R PLIC/thalamic infarcts.    PT Comments    Patient progressing with mobility this session able to ambulate some with chair following and walker, limited due to incontinent episode.  Patient eager for mobilizing and remains appropriate for CIR level rehab upon d/c.    Follow Up Recommendations  CIR     Equipment Recommendations  None recommended by PT    Recommendations for Other Services       Precautions / Restrictions Precautions Precautions: Fall    Mobility  Bed Mobility Overal bed mobility: Needs Assistance Bed Mobility: Supine to Sit     Supine to sit: Mod assist     General bed mobility comments: able to initiate with trunk and reaching with L hand. pt able to keep L knee in flexion  Transfers Overall transfer level: Needs assistance Equipment used: Rolling walker (2 wheeled) Transfers: Sit to/from Stand Sit to Stand: Mod assist;From elevated surface         General transfer comment: pt requires (A) for safety cues but able to power up   Ambulation/Gait Ambulation/Gait assistance: Mod assist;+2 safety/equipment Gait Distance (Feet): 14 Feet Assistive device: Rolling walker (2 wheeled) Gait Pattern/deviations: Step-through pattern;Step-to pattern;Shuffle;Trunk flexed;Wide base of support     General Gait Details: assist for balance, safety, chair follow and cues for posture, limited due to urinary incontinence   Stairs             Wheelchair Mobility    Modified Rankin (Stroke Patients  Only) Modified Rankin (Stroke Patients Only) Pre-Morbid Rankin Score: Moderate disability Modified Rankin: Moderately severe disability     Balance Overall balance assessment: Needs assistance Sitting-balance support: Feet supported;Single extremity supported Sitting balance-Leahy Scale: Poor Sitting balance - Comments: leaning L Postural control: Left lateral lean Standing balance support: Bilateral upper extremity supported Standing balance-Leahy Scale: Poor Standing balance comment: requires RW with (A) to balance due to leaning to R                            Cognition Arousal/Alertness: Awake/alert Behavior During Therapy: Flat affect Overall Cognitive Status: Impaired/Different from baseline Area of Impairment: Orientation;Attention;Safety/judgement;Following commands                 Orientation Level: Disoriented to;Place;Time;Situation Current Attention Level: Focused   Following Commands: Follows one step commands with increased time Safety/Judgement: Decreased awareness of deficits            Exercises      General Comments General comments (skin integrity, edema, etc.): son present throughout session and wife arrived end of session      Pertinent Vitals/Pain Pain Assessment: Faces Faces Pain Scale: Hurts even more Pain Location: back Pain Descriptors / Indicators: Discomfort Pain Intervention(s): Monitored during session;Repositioned    Home Living       Type of Home: House         Additional Comments: (Long term care policy may pay for home care)    Prior Function  PT Goals (current goals can now be found in the care plan section) Progress towards PT goals: Progressing toward goals    Frequency    Min 4X/week      PT Plan Current plan remains appropriate    Co-evaluation PT/OT/SLP Co-Evaluation/Treatment: Yes Reason for Co-Treatment: For patient/therapist safety;To address functional/ADL  transfers PT goals addressed during session: Mobility/safety with mobility;Balance;Proper use of DME        AM-PAC PT "6 Clicks" Mobility   Outcome Measure  Help needed turning from your back to your side while in a flat bed without using bedrails?: A Lot Help needed moving from lying on your back to sitting on the side of a flat bed without using bedrails?: A Lot Help needed moving to and from a bed to a chair (including a wheelchair)?: A Lot Help needed standing up from a chair using your arms (e.g., wheelchair or bedside chair)?: A Lot Help needed to walk in hospital room?: A Lot Help needed climbing 3-5 steps with a railing? : Total 6 Click Score: 11    End of Session Equipment Utilized During Treatment: Gait belt Activity Tolerance: Patient limited by fatigue Patient left: in chair;with call bell/phone within reach;with family/visitor present;with chair alarm set   PT Visit Diagnosis: Other abnormalities of gait and mobility (R26.89);Muscle weakness (generalized) (M62.81);Other symptoms and signs involving the nervous system [id number])     Time: [id number] PT Time Calculation (min) (ACUTE ONLY): 35 min  Charges:  $Gait Training: 8-22 mins                     Magda Kiel, [place] [phone] 05/30/2019    Reginia Naas 05/30/2019, 5:53 PM

## 2019-05-30 NOTE — Progress Notes (Signed)
[facility] Progress Note  Patient: William Mann    [id number]  DOA: 05/28/2019     Date of Service: the patient was seen and examined on 05/30/2019  Chief complaint. Slurred speech  Brief hospital course: Past medical history of PVD, HTN, HLD, DVT on Eliquis, PPM implant, carotid stenosis SP right carotid endarterectomy, COVID-25 February 2019.  Presents with slurred speech and facial droop as well as recurrent fall.  MRI brain positive for small acute infarct in left basal ganglia and corona radiata as well as right thalamus.  Patient is on therapeutic anticoagulation with Eliquis.  Neurology consulted. Currently further plan is further stroke work-up.  Assessment and Plan: 1.  Bilateral CVA Dysarthria Neurology consulted. Out of the window.  For TPA. Already on anticoagulation prior to admission. 2D echo currently pending. Octa pacemaker interrogation currently pending. 81 mg aspirin and Eliquis p.o. after cortrack insertion Neurology suspecting supraclinoid ICA stenosis as a potential etiology for which we will avoid lower BP.  Long-term blood pressure goal 1 [id number] systolic.  2.  Recurrent fall Secondary to stroke. PT OT consulted. Recommending CIR. No acute fractures.  3.  History of DVT June 2020 Currently on IV heparin. Transition to Eliquis once speech clears the patient for p.o. intake.  4.  Symptomatic bradycardia SP PPM implant Recurrent PVC NSVT Schedule Lopressor. Monitor for hypotension in the setting of acute stroke.  5.  BPH Flomax ordered but currently holding due to n.p.o. status.  6.  HLD Simvastatin ordered 40 mg.  Currently holding due to n.p.o. status.  7.  LVH Echocardiogram currently pending.  8.  Macrocytosis H&H relatively stable.  Monitor.  9.  Metabolic acidosis. Likely from p.o. intake. Lactic acid normal. ABG shows no hypercarbia. No acidosis as well on ABG. Currently resolved.  Monitor.  10.   Dysphagia. Currently inserting NG tube for feeding and medication.  Will monitor improvement.  Diet: NPO.  Failed MBS. DVT Prophylaxis: Therapeutic Anticoagulation with heparin  Advance goals of care discussion: Full code  Family Communication: family was present at bedside, at the time of interview.  The pt provided permission to discuss medical plan with the family. Opportunity was given to ask question and all questions were answered satisfactorily.   Disposition:  Status is: Inpatient  Remains inpatient appropriate because:Ongoing diagnostic testing needed not appropriate for outpatient work up   Dispo: The patient is from: ALF              Anticipated d/c is to: SNF              Anticipated d/c date is: 2 days              Patient currently is not medically stable to d/c.    Subjective: Speech more incoherent likely from dry mouth.  No nausea no vomiting.  No other acute events overnight.  Somewhat confused.  Able to ambulate up to the door.  Physical Exam: General:  alert oriented to time and place.  Appear in moderate distress, affect flat in affect Eyes: PERRL ENT: Oral Mucosa Clear, dry  Neck: difficult to assess  JVD,  Cardiovascular: S1 and S2 Present, no Murmur,  Respiratory: good respiratory effort, Bilateral Air entry equal and Decreased, no Crackles, no wheezes Abdomen: Bowel Sound present, Soft and no tenderness,  Skin: no rash Extremities: no Pedal edema, no calf tenderness Neurologic: Dysarthria, right upper extremity weakness, abnormal past-pointing on right Gait not checked due to patient safety concerns  Vitals:   05/30/19 0658 05/30/19 0923 05/30/19 1233 05/30/19 1634  BP: (!) 146/93 137/84 125/78 (!) 135/100  Pulse: 97 (!) 103 94 100  Resp:  20 (!) 23 17  Temp:  98.1 F (36.7 C) 98.6 F (37 C) 98.3 F (36.8 C)  TempSrc:  Axillary Oral Axillary  SpO2:  95% 96% 97%    Intake/Output Summary (Last 24 hours) at 05/30/2019 1917 Last data filed at  05/30/2019 0300 Gross per 24 hour  Intake 89 ml  Output --  Net 89 ml   There were no vitals filed for this visit.  Data Reviewed: I have personally reviewed and interpreted daily labs, tele strips, imagings as discussed above. I reviewed all nursing notes, pharmacy notes, vitals, pertinent old records I have discussed plan of care as described above with RN and patient/family.  CBC: Recent Labs  Lab 05/27/19 1629 05/27/19 1629 05/28/19 1154 05/28/19 1216 05/29/19 1147 05/29/19 1537 05/30/19 0707  WBC 6.6  --  9.1  --  14.3*  --  15.8*  NEUTROABS  --   --  7.3  --   --   --   --   HGB 14.3   < > 15.2 15.0 15.5 15.3 16.1  HCT 43.6   < > 45.9 44.0 45.9 45.0 46.6  MCV 103.6*  --  104.1*  --  101.5*  --  100.0  PLT 160  --  174  --  180  --  199   < > = values in this interval not displayed.   Basic Metabolic Panel: Recent Labs  Lab 05/27/19 1629 05/27/19 1629 05/28/19 1154 05/28/19 1216 05/29/19 1147 05/29/19 1537 05/30/19 0707  NA 141   < > 141 142 140 138 139  K 3.8   < > 3.9 3.8 3.9 3.7 3.6  CL 110  --  111 110 107  --  105  CO2 22  --  22  --  16*  --  20*  GLUCOSE 135*  --  104* 98 104*  --  115*  BUN 17  --  14 15 14   --  16  CREATININE 0.98  --  0.85 0.70 0.75  --  0.79  CALCIUM 9.2  --  8.7*  --  8.5*  --  8.6*  MG  --   --   --   --  1.9  --   --    < > = values in this interval not displayed.    Studies: DG Swallowing Func-Speech Pathology  Result Date: 05/30/2019 Objective Swallowing Evaluation: Type of Study: MBS-Modified Barium Swallow Study  Patient Details Name: Maceo Bernhagen MRN: [id number] Date of Birth: 02/14/1929 Today's Date: 05/30/2019 Time: SLP Start Time (ACUTE ONLY): [id number] -SLP Stop Time (ACUTE ONLY): 0853 SLP Time Calculation (min) (ACUTE ONLY): 20 min Past Medical History: Past Medical History: Diagnosis Date . Aortic atherosclerosis ([place])  . Bilateral hearing loss  . BPH (benign prostatic hyperplasia)  . Cancer ([place])   basal cell  carcinoma . Carotid artery occlusion  . Constipation  . Coronary artery disease  . DVT (deep venous thrombosis) ([place])  . Emphysema lung ([place])  . Fatty liver  . GERD (gastroesophageal reflux disease)  . History of echocardiogram   Echo 4/17: Vigorous LVEF, EF 65-70%, normal wall motion, grade 1 diastolic dysfunction, mildly dilated ascending aorta (40 mm) . Hyperlipidemia  . Hypertension  . Insomnia  . PVD (peripheral vascular disease) ([place])  . Thyroid nodule  Past Surgical  History: Past Surgical History: Procedure Laterality Date . APPENDECTOMY   . CAROTID ENDARTERECTOMY  03-05-09  right CEA . COLONOSCOPY  Oct. 2013  one polyp . HERNIA REPAIR   . LIPOMA EXCISION   . PACEMAKER IMPLANT N/A 12/23/2018  Procedure: PACEMAKER IMPLANT;  Surgeon: Evans Lance, MD;  Location: [facility];  Service: Cardiovascular;  Laterality: N/A; HPI: Harpal Summerson is a 86 y.o. male with medical history significant of thyroid nodule, emphysema, GERD, right carotid endarterectomy,PVD, hypertension, hyperlipidemia, lower extremity DVT, bilateral hearing loss, presented with new onset of slurred speech and facial droop. Per chart wife also noticed significant right leg weakness, right-sided facial droop, and cough/choking after eating drink since last night. MRI shows small acute infarcts in the left basal ganglia/corona radiata and right thalamus. CXR 4/21 No acute cardiopulmonary disease  No data recorded Assessment / Plan / Recommendation CHL IP CLINICAL IMPRESSIONS 05/30/2019 Clinical Impression Pt was awake, able to follow simple commands however confusion is increased and question delirium. Significant xerstomia due to mouth breathing and NPO status and provided oral cavity moisture. He demonstrated moderate-severe oropharyngeal dysphagia secondary to neuro impairments with confusion exacerbating. Oral phase lacked adequate control, cohesion, transit with right sulci residual, anterior spill and delayed and ineffcient  transfer. His laryngeal elevation, anterior hyoid excursion was decreased and larygneal vestibule patentcy during swallow on onto vocal cords with nectar and thin barium (inconsistent and unreliable sensation). Nectar was penetrated prior to laryngeal closure given decreased oral control. Barium unable to completely clear pharynx given weakness with resultant max residue in valleculae. Pt should not consume solids/liquids presently and recommend short term alternate means nutrition. Prognosis to return to modified po's (at minumum) appears fair-good as delirium subsides and cognition improved for repeat MBS when appropriate.      SLP Visit Diagnosis Dysphagia, oropharyngeal phase (R13.12) Attention and concentration deficit following -- Frontal lobe and executive function deficit following -- Impact on safety and function Severe aspiration risk;Moderate aspiration risk   CHL IP TREATMENT RECOMMENDATION 05/30/2019 Treatment Recommendations Therapy as outlined in treatment plan below   Prognosis 05/30/2019 Prognosis for Safe Diet Advancement (No Data) Barriers to Reach Goals -- Barriers/Prognosis Comment -- CHL IP DIET RECOMMENDATION 05/30/2019 SLP Diet Recommendations NPO;Ice chips PRN after oral care Liquid Administration via -- Medication Administration Via alternative means Compensations -- Postural Changes --   CHL IP OTHER RECOMMENDATIONS 05/30/2019 Recommended Consults -- Oral Care Recommendations Oral care QID Other Recommendations --   CHL IP FOLLOW UP RECOMMENDATIONS 05/30/2019 Follow up Recommendations Skilled Nursing facility   [facility] IP FREQUENCY AND DURATION 05/30/2019 Speech Therapy Frequency (ACUTE ONLY) min 2x/week Treatment Duration 2 weeks      CHL IP ORAL PHASE 05/30/2019 Oral Phase Impaired Oral - Pudding Teaspoon -- Oral - Pudding Cup -- Oral - Honey Teaspoon Weak lingual manipulation;Reduced posterior propulsion;Right pocketing in lateral sulci;Lingual/palatal residue;Delayed oral transit;Decreased bolus  cohesion;Right anterior bolus loss Oral - Honey Cup Weak lingual manipulation;Reduced posterior propulsion;Right pocketing in lateral sulci;Lingual/palatal residue;Delayed oral transit;Decreased bolus cohesion;Right anterior bolus loss Oral - Nectar Teaspoon Weak lingual manipulation;Reduced posterior propulsion;Right pocketing in lateral sulci;Lingual/palatal residue;Delayed oral transit;Decreased bolus cohesion;Right anterior bolus loss Oral - Nectar Cup Weak lingual manipulation;Reduced posterior propulsion;Right pocketing in lateral sulci;Lingual/palatal residue;Delayed oral transit;Decreased bolus cohesion;Right anterior bolus loss Oral - Nectar Straw -- Oral - Thin Teaspoon Weak lingual manipulation;Reduced posterior propulsion;Right pocketing in lateral sulci;Lingual/palatal residue;Delayed oral transit;Decreased bolus cohesion;Right anterior bolus loss Oral - Thin Cup -- Oral - Thin Straw --  Oral - Puree Weak lingual manipulation;Reduced posterior propulsion;Right pocketing in lateral sulci;Lingual/palatal residue;Delayed oral transit;Decreased bolus cohesion Oral - Mech Soft -- Oral - Regular -- Oral - Multi-Consistency -- Oral - Pill -- Oral Phase - Comment --  CHL IP PHARYNGEAL PHASE 05/30/2019 Pharyngeal Phase Impaired Pharyngeal- Pudding Teaspoon -- Pharyngeal -- Pharyngeal- Pudding Cup -- Pharyngeal -- Pharyngeal- Honey Teaspoon Delayed swallow initiation-vallecula;Pharyngeal residue - valleculae;Reduced anterior laryngeal mobility;Reduced laryngeal elevation Pharyngeal -- Pharyngeal- Honey Cup Pharyngeal residue - valleculae;Reduced laryngeal elevation;Reduced anterior laryngeal mobility Pharyngeal -- Pharyngeal- Nectar Teaspoon Reduced laryngeal elevation;Reduced anterior laryngeal mobility Pharyngeal -- Pharyngeal- Nectar Cup Penetration/Aspiration before swallow;Reduced anterior laryngeal mobility;Reduced laryngeal elevation;Pharyngeal residue - valleculae Pharyngeal Material enters airway, CONTACTS  cords and not ejected out Pharyngeal- Nectar Straw -- Pharyngeal -- Pharyngeal- Thin Teaspoon Penetration/Aspiration during swallow;Reduced laryngeal elevation;Reduced anterior laryngeal mobility;Pharyngeal residue - valleculae Pharyngeal Material enters airway, CONTACTS cords and not ejected out Pharyngeal- Thin Cup -- Pharyngeal -- Pharyngeal- Thin Straw -- Pharyngeal -- Pharyngeal- Puree Pharyngeal residue - valleculae;Reduced anterior laryngeal mobility;Reduced laryngeal elevation Pharyngeal -- Pharyngeal- Mechanical Soft -- Pharyngeal -- Pharyngeal- Regular -- Pharyngeal -- Pharyngeal- Multi-consistency -- Pharyngeal -- Pharyngeal- Pill -- Pharyngeal -- Pharyngeal Comment --  No flowsheet data found. Houston Siren 05/30/2019, 10:59 AM Orbie Pyo Colvin Caroli.Ed Actor Pager [phone] Office [phone]               Scheduled Meds: . aspirin  300 mg Rectal Daily  . feeding supplement (PRO-STAT SUGAR FREE 64)  30 mL Per Tube Daily  . free water  110 mL Per Tube Q4H  . [START ON 05/31/2019] simvastatin  40 mg Per Tube q1800  . sodium chloride  1 spray Each Nare QID  . tamsulosin  0.4 mg Oral Daily   Continuous Infusions: . feeding supplement (OSMOLITE 1.2 CAL) 1,000 mL (05/30/19 1653)  . heparin 1,150 Units/hr (05/29/19 1848)   PRN Meds: acetaminophen **OR** acetaminophen (TYLENOL) oral liquid 160 mg/5 mL **OR** acetaminophen, metoprolol tartrate  Time spent: 35 minutes  Author: Berle Mull, MD [facility] 05/30/2019 7:17 PM  To reach On-call, see care teams to locate the attending and reach out to them via [email or web address]. If 7PM-7AM, please contact night-coverage If you still have difficulty reaching the attending provider, please page the [facility] (Director on Call) for [facility] on amion for assistance.

## 2019-05-30 NOTE — Progress Notes (Signed)
Modified Barium Swallow Progress Note  Patient Details  Name: William Mann MRN: [id number] Date of Birth: [birth year]  Today's Date: 05/30/2019  Modified Barium Swallow completed.  Full report located under Chart Review in the Imaging Section.  Brief recommendations include the following:  Clinical Impression  Pt was awake, able to follow simple commands however confusion is increased and question delirium. Significant xerstomia due to mouth breathing and NPO status and provided oral cavity moisture. He demonstrated moderate-severe oropharyngeal dysphagia secondary to neuro impairments with confusion exacerbating. Oral phase lacked adequate control, cohesion, transit with right sulci residual, anterior spill and delayed and ineffcient transfer. His laryngeal elevation, anterior hyoid excursion was decreased and larygneal vestibule patentcy during swallow on onto vocal cords with nectar and thin barium (inconsistent and unreliable sensation). Nectar was penetrated prior to laryngeal closure given decreased oral control. Barium unable to completely clear pharynx given weakness with resultant max residue in valleculae. Pt should not consume solids/liquids presently and recommend short term alternate means nutrition. Prognosis to return to modified po's (at minumum) appears fair-good as delirium subsides and cognition improved for repeat MBS when appropriate.        Swallow Evaluation Recommendations       SLP Diet Recommendations: NPO;Ice chips PRN after oral care       Medication Administration: Via alternative means               Oral Care Recommendations: Oral care QID        [place] Siren 05/30/2019,11:00 AM  Orbie Pyo Colvin Caroli.Ed Risk analyst [phone] Office [phone]

## 2019-05-30 NOTE — Consult Note (Addendum)
Physical Medicine and Rehabilitation Consult   Reason for Consult:Functional deficits due to stroke Referring Physician: Dr. Posey Pronto   HPI: William Mann is a 86 y.o. male with history of CAD, DVT--on Eliquis, PPM, fatty liver,?  Dementia Covid 19 (3 months ago) who was admitted on 05/27/2019 with right lower extremity weakness, facial droop, and dysarthria.  History taken from chart review, son, and wife due to cognition and severe dysarthria.  Patient's wife and grandson can provide support at discharge.  Patient was reported to have falls due to right lower extremity weakness.  CTA head/neck was negative for LVO and left ICA plaque with 50% stenosis and bilateral moderate to severe PCA stenosis. MRI brain personally reviewed, showing small left brain infarcts.  Per report, acute infarcts in left basal ganglia and corona radiata and right thalamus.  Echocardiogram showed ejection fraction of 55% with moderate mitral valve calcification and AMS.  No wall abnormalities noted.  BLE dopplers showed age indeterminate right gastroc DVT and chronic chronic DVT in right FV. Dr. Erlinda Hong felt that stroke embolic due to large vessel disease and to continue Eliquis and ASA 81 mg once cleared for po. MBS performed, with recommendations for patient to remain n.p.o. due to moderate to severe oropharyngeal dysphagia exacerbated by confusion.  Hospital course further complicated by leukocytosis as well as tachycardia.  Therapy evaluations completed revealing right weakness with right lean, decreased coordination, poor safety awareness and congnitive deficits affecting ADLs and mobility. CIR recommended due to functional decline.    Review of Systems  Unable to perform ROS: Mental acuity    Past Medical History:  Diagnosis Date  . Aortic atherosclerosis ([place])   . Bilateral hearing loss   . BPH (benign prostatic hyperplasia)   . Cancer ([place])    basal cell carcinoma  . Carotid artery occlusion   .  Constipation   . Coronary artery disease   . DVT (deep venous thrombosis) ([place])   . Emphysema lung ([place])   . Fatty liver   . GERD (gastroesophageal reflux disease)   . History of echocardiogram    Echo 4/17: Vigorous LVEF, EF 65-70%, normal wall motion, grade 1 diastolic dysfunction, mildly dilated ascending aorta (40 mm)  . Hyperlipidemia   . Hypertension   . Insomnia   . PVD (peripheral vascular disease) ([place])   . Thyroid nodule     Past Surgical History:  Procedure Laterality Date  . APPENDECTOMY    . CAROTID ENDARTERECTOMY  03-05-09   right CEA  . COLONOSCOPY  Oct. 2013   one polyp  . HERNIA REPAIR    . LIPOMA EXCISION    . PACEMAKER IMPLANT N/A 12/23/2018   Procedure: PACEMAKER IMPLANT;  Surgeon: Evans Lance, MD;  Location: [facility];  Service: Cardiovascular;  Laterality: N/A;    Family History  Problem Relation Age of Onset  . Heart disease Sister   . Cancer Sister   . Hypertension Sister   . Heart attack Sister   . Heart disease Brother   . Stroke Brother   . Hypertension Brother   . Heart disease Brother   . Hyperlipidemia Son     Social History: Married. He used cane when out of home and tries not to use cane in the home. Retired Art gallery manager.  He has had home therapy for 4 months since d/c from SNF.  He  reports that he has never smoked. He has never used smokeless tobacco. He reports that he  does not drink alcohol or use drugs.    Allergies  Allergen Reactions  . Atorvastatin Other (See Comments)    Liver inflammation    . Rosuvastatin Other (See Comments)    Muscle pain  . Viagra [Sildenafil] Other (See Comments)    Reaction ??    Medications Prior to Admission  Medication Sig Dispense Refill  . apixaban (ELIQUIS) 5 MG TABS tablet Take 1 tablet (5 mg total) by mouth 2 (two) times daily. Resume 12/27/18 evening dose (Patient taking differently: Take 5 mg by mouth 2 (two) times daily. ) 60 tablet 1  . aspirin EC 81 MG tablet Take 81 mg by  mouth 2 (two) times a week.     . Calcium Carbonate (CALTRATE 600 PO) Take 600 mg by mouth 2 (two) times a week.     . Cholecalciferol (VITAMIN D-3 PO) Take 1 capsule by mouth daily.     . Coenzyme Q10 (COQ-10) 100 MG CAPS Take 100 mg by mouth daily.     . cyanocobalamin 100 MCG tablet Take 100 mcg by mouth daily.    . Multiple Vitamin (MULTIVITAMIN) tablet Take 1 tablet by mouth daily.    Marland Kitchen senna (SENOKOT) 8.6 MG tablet Take 1 tablet by mouth 2 (two) times daily as needed for constipation.    . simvastatin (ZOCOR) 10 MG tablet Take 10 mg by mouth at bedtime.    . tamsulosin (FLOMAX) 0.4 MG CAPS capsule Take 0.4 mg by mouth daily.    Marland Kitchen zolpidem (AMBIEN) 5 MG tablet Take 5 mg by mouth at bedtime as needed for sleep.      Home: Home Living Family/patient expects to be discharged to:: Private residence Living Arrangements: Spouse/significant other, Other relatives(grandson) Available Help at Discharge: Family Type of Home: House Home Access: Level entry [place]: One level [place]: [place] - single point, Civil engineer, contracting, Environmental consultant - 2 wheels  Functional History: Prior Function Level of Independence: Needs assistance Gait / Transfers Assistance Needed: used walker or cane to ambulate in the home ADL's / Homemaking Assistance Needed: toileted sometimes on his own, wife helps sometimes and for showers Functional Status:  Mobility: Bed Mobility Overal bed mobility: Needs Assistance Bed Mobility: Supine to Sit, Sit to Supine Supine to sit: Max assist Sit to supine: Mod assist General bed mobility comments: assist to initiate to bring legs off bed, assist for lifting trunk upright, to supine, pt lowered trunk, assist for legs and positioning in supine Transfers Overall transfer level: Needs assistance Equipment used: Rolling walker (2 wheeled) Transfers: Sit to/from Stand Sit to Stand: Mod assist, From elevated surface General transfer comment: from stretcher in the ED, assist to lift  to stand, then for support for balance Ambulation/Gait Ambulation/Gait assistance: Mod assist, Max assist Gait Distance (Feet): 2 Feet Assistive device: Rolling walker (2 wheeled) Gait Pattern/deviations: Step-to pattern, Decreased stride length, Staggering right, Shuffle, Decreased weight shift to left General Gait Details: leaning R and assist for L weight shift to move R LE, assist for balance and walker manipulation    ADL:    Cognition: Cognition Overall Cognitive Status: Impaired/Different from baseline Orientation Level: Oriented to person, Oriented to place Cognition Arousal/Alertness: Lethargic Behavior During Therapy: Flat affect Overall Cognitive Status: Impaired/Different from baseline Area of Impairment: Orientation, Attention, Safety/judgement, Following commands Orientation Level: Disoriented to, Place, Time, Situation Current Attention Level: Focused Following Commands: Follows one step commands with increased time Safety/Judgement: Decreased awareness of deficits   Blood pressure 137/84, pulse (!) 103, temperature  98.1 F (36.7 C), temperature source Axillary, resp. rate 20, SpO2 95 %. Physical Exam  Nursing note and vitals reviewed. Constitutional: He appears well-developed and well-nourished.  Disheveled  HENT:  Head: Normocephalic and atraumatic.  Eyes: Right eye exhibits no discharge. Left eye exhibits no discharge. No scleral icterus.  Neck: No tracheal deviation present. No thyromegaly present.  Respiratory: Effort normal. No stridor. No respiratory distress.  GI: Soft.  Musculoskeletal:     Comments: No edema or tenderness in extremities  Neurological: He is alert.  Severe dysarthria Dysphonia Right facial weakness Motor: Limited due to participation, but appears to have >/antigravity strength throughout [facility]   Skin: Skin is warm and dry.  Psychiatric:  Unable to assess due to mentation and severe dysarthria    Results for orders placed or  performed during the hospital encounter of 05/28/19 (from the past 24 hour(s))  Hemoglobin A1c     Status: None   Collection Time: 05/29/19 11:47 AM  Result Value Ref Range   Hgb A1c MFr Bld 5.0 4.8 - 5.6 %   Mean Plasma Glucose 96.8 mg/dL  Lipid panel     Status: Abnormal   Collection Time: 05/29/19 11:47 AM  Result Value Ref Range   Cholesterol 222 (H) 0 - 200 mg/dL   Triglycerides 43 <150 mg/dL   HDL 53 >40 mg/dL   Total CHOL/HDL Ratio 4.2 RATIO   VLDL 9 0 - 40 mg/dL   LDL Cholesterol 160 (H) 0 - 99 mg/dL  CBC     Status: Abnormal   Collection Time: 05/29/19 11:47 AM  Result Value Ref Range   WBC 14.3 (H) 4.0 - 10.5 K/uL   RBC 4.52 4.22 - 5.81 MIL/uL   Hemoglobin 15.5 13.0 - 17.0 g/dL   HCT 45.9 39.0 - 52.0 %   MCV 101.5 (H) 80.0 - 100.0 fL   MCH 34.3 (H) 26.0 - 34.0 pg   MCHC 33.8 30.0 - 36.0 g/dL   RDW 13.3 11.5 - 15.5 %   Platelets 180 150 - 400 K/uL   nRBC 0.0 0.0 - 0.2 %  Basic metabolic panel     Status: Abnormal   Collection Time: 05/29/19 11:47 AM  Result Value Ref Range   Sodium 140 135 - 145 mmol/L   Potassium 3.9 3.5 - 5.1 mmol/L   Chloride 107 98 - 111 mmol/L   CO2 16 (L) 22 - 32 mmol/L   Glucose, Bld 104 (H) 70 - 99 mg/dL   BUN 14 8 - 23 mg/dL   Creatinine, Ser 0.75 0.61 - 1.24 mg/dL   Calcium 8.5 (L) 8.9 - 10.3 mg/dL   GFR calc non Af Amer >60 >60 mL/min   GFR calc Af Amer >60 >60 mL/min   Anion gap 17 (H) 5 - 15  Magnesium     Status: None   Collection Time: 05/29/19 11:47 AM  Result Value Ref Range   Magnesium 1.9 1.7 - 2.4 mg/dL  APTT     Status: Abnormal   Collection Time: 05/29/19 12:55 PM  Result Value Ref Range   aPTT 85 (H) 24 - 36 seconds  Heparin level (unfractionated)     Status: Abnormal   Collection Time: 05/29/19 12:55 PM  Result Value Ref Range   Heparin Unfractionated 1.05 (H) 0.30 - 0.70 IU/mL  Procalcitonin - Baseline     Status: None   Collection Time: 05/29/19  3:00 PM  Result Value Ref Range   Procalcitonin 0.39 ng/mL    Lactic  acid, plasma     Status: None   Collection Time: 05/29/19  3:11 PM  Result Value Ref Range   Lactic Acid, Venous 1.8 0.5 - 1.9 mmol/L  I-STAT 7, (LYTES, BLD GAS, ICA, H+H)     Status: Abnormal   Collection Time: 05/29/19  3:37 PM  Result Value Ref Range   pH, Arterial 7.409 7.350 - 7.450   pCO2 arterial 31.6 (L) 32.0 - 48.0 mmHg   pO2, Arterial 69 (L) 83.0 - 108.0 mmHg   Bicarbonate 20.0 20.0 - 28.0 mmol/L   TCO2 21 (L) 22 - 32 mmol/L   O2 Saturation 94.0 %   Acid-base deficit 4.0 (H) 0.0 - 2.0 mmol/L   Sodium 138 135 - 145 mmol/L   Potassium 3.7 3.5 - 5.1 mmol/L   Calcium, Ion 1.22 1.15 - 1.40 mmol/L   HCT 45.0 39.0 - 52.0 %   Hemoglobin 15.3 13.0 - 17.0 g/dL   Collection site Radial    Drawn by MD    Sample type ARTERIAL   Lactic acid, plasma     Status: Abnormal   Collection Time: 05/29/19  6:07 PM  Result Value Ref Range   Lactic Acid, Venous 2.3 (HH) 0.5 - 1.9 mmol/L  APTT     Status: Abnormal   Collection Time: 05/29/19  9:59 PM  Result Value Ref Range   aPTT 75 (H) 24 - 36 seconds  CBC     Status: Abnormal   Collection Time: 05/30/19  7:07 AM  Result Value Ref Range   WBC 15.8 (H) 4.0 - 10.5 K/uL   RBC 4.66 4.22 - 5.81 MIL/uL   Hemoglobin 16.1 13.0 - 17.0 g/dL   HCT 46.6 39.0 - 52.0 %   MCV 100.0 80.0 - 100.0 fL   MCH 34.5 (H) 26.0 - 34.0 pg   MCHC 34.5 30.0 - 36.0 g/dL   RDW 13.2 11.5 - 15.5 %   Platelets 199 150 - 400 K/uL   nRBC 0.0 0.0 - 0.2 %  APTT     Status: Abnormal   Collection Time: 05/30/19  7:07 AM  Result Value Ref Range   aPTT 73 (H) 24 - 36 seconds  Heparin level (unfractionated)     Status: Abnormal   Collection Time: 05/30/19  7:07 AM  Result Value Ref Range   Heparin Unfractionated 0.79 (H) 0.30 - 0.70 IU/mL  Procalcitonin     Status: None   Collection Time: 05/30/19  7:07 AM  Result Value Ref Range   Procalcitonin 0.11 ng/mL  Basic metabolic panel     Status: Abnormal   Collection Time: 05/30/19  7:07 AM  Result Value Ref  Range   Sodium 139 135 - 145 mmol/L   Potassium 3.6 3.5 - 5.1 mmol/L   Chloride 105 98 - 111 mmol/L   CO2 20 (L) 22 - 32 mmol/L   Glucose, Bld 115 (H) 70 - 99 mg/dL   BUN 16 8 - 23 mg/dL   Creatinine, Ser 0.79 0.61 - 1.24 mg/dL   Calcium 8.6 (L) 8.9 - 10.3 mg/dL   GFR calc non Af Amer >60 >60 mL/min   GFR calc Af Amer >60 >60 mL/min   Anion gap 14 5 - 15   CT ANGIO HEAD W OR WO CONTRAST  Result Date: 05/28/2019 CLINICAL DATA:  Follow-up examination for acute stroke. EXAM: CT ANGIOGRAPHY HEAD AND NECK TECHNIQUE: Multidetector CT imaging of the head and neck was performed using the standard protocol during bolus administration of  intravenous contrast. Multiplanar CT image reconstructions and MIPs were obtained to evaluate the vascular anatomy. Carotid stenosis measurements (when applicable) are obtained utilizing NASCET criteria, using the distal internal carotid diameter as the denominator. CONTRAST:  154mL OMNIPAQUE IOHEXOL 350 MG/ML SOLN COMPARISON:  Prior CT and MRI from earlier the same day. FINDINGS: CTA NECK FINDINGS Aortic arch: Visualized aortic arch a within normal limits for caliber. Bovine branch pattern with common origin of the right brachiocephalic and left common carotid artery noted. Mild atheromatous plaque within the arch itself. No hemodynamically significant stenosis about the origin of the great vessels. Visualized subclavian arteries patent without flow-limiting stenosis. Right carotid system: Right CCA tortuous proximally but is widely patent to the bifurcation without stenosis. Mixed eccentric plaque about the right bifurcation without hemodynamically significant stenosis. Probable changes related to previous right carotid endarterectomy. Right ICA widely patent distally to the skull base without stenosis, dissection, or occlusion eccentric plaque at the cervical petrous junction at the skull base with short-segment mild stenosis noted (series 6, image 225). Calcified  atherosclerotic plaque seen within this region on prior noncontrast head CT. Left carotid system: Left CCA tortuous proximally but is widely patent to the bifurcation without significant stenosis. Bulky calcified plaque about the left bifurcation/proximal left ICA with associated stenosis of up to 50% by NASCET criteria. Left ICA mildly tortuous but otherwise widely patent distally to the skull base without stenosis, dissection or occlusion. Vertebral arteries: Both vertebral arteries arise from the subclavian arteries. Focal plaque at the origin of the right vertebral artery with mild ostial stenosis. Left vertebral artery dominant. Vertebral arteries mildly tortuous but otherwise widely patent within the neck without stenosis, dissection or occlusion. Skeleton: No visible acute osseous abnormality. No worrisome lytic or blastic osseous lesions. Dens is somewhat angulated, nonspecific, but could be related to remotely healed trauma. Moderate cervical spondylosis present at C5-6 and C6-7. Other neck: No other acute soft tissue abnormality within the neck. No mass lesion or adenopathy. 7 mm right thyroid nodule noted, of doubtful significance given size and patient age. No follow-up imaging recommended regarding this lesion. Upper chest: Visualized upper chest demonstrates no acute finding. Scattered predominantly subpleural reticular linear densities could reflect atelectasis, scarring, or possibly fibrosis. Left-sided pacemaker/AICD noted. Review of the MIP images confirms the above findings CTA HEAD FINDINGS Anterior circulation: Petrous segments widely patent. Scattered atheromatous plaque throughout the cavernous/supraclinoid ICAs bilaterally. Associated short-segment severe stenosis seen at the supraclinoid right ICA (series 6, image 265). Moderate to severe short-segment stenosis present at the para clinoid left ICA as well (series 6, image 263). ICA termini well perfused. A1 segments patent bilaterally.  Normal anterior communicating artery complex. Anterior cerebral arteries patent to their distal aspects without stenosis. No M1 stenosis or occlusion. Normal MCA bifurcations. Distal MCA branches well perfused and symmetric. Posterior circulation: Dominant left vertebral artery widely patent to the vertebrobasilar junction without stenosis. Mild atheromatous irregularity within the right vertebral artery without stenosis. Both picas patent. Basilar widely patent to its distal aspect without stenosis. Superior cerebral arteries patent bilaterally. Both PCAs primarily supplied via the basilar. Multifocal moderate to severe bilateral P2 stenoses noted. PCAs remain well perfused to their distal aspects. Venous sinuses: Patent allowing for timing the contrast bolus. Anatomic variants: None significant. 2 mm focal outpouching arising from the supraclinoid left ICA demonstrates a tiny vessel extending from its apex, felt to be most consistent with a vascular infundibulum related to a hypoplastic left posterior communicating artery. No visible aneurysm. Review of the  MIP images confirms the above findings IMPRESSION: 1. Negative CTA for large vessel occlusion. 2. Bulky atheromatous plaque about the left carotid bifurcation with associated 50% stenosis by NASCET criteria. 3. Prior right carotid endarterectomy without significant residual or recurrent stenosis. 4. Moderate to severe bilateral stenoses involving the supraclinoid ICAs bilaterally, right worse than left. 5. Moderate to severe multifocal bilateral P2 stenoses. Electronically Signed   By: Jeannine Boga M.D.   On: 05/28/2019 22:23   DG Chest 2 View  Result Date: 05/28/2019 CLINICAL DATA:  Cough.  Chest pain.  Hypertension. EXAM: CHEST - 2 VIEW COMPARISON:  02/28/2019 FINDINGS: Pacer with leads at right atrium and right ventricle. No lead discontinuity. Midline trachea. Borderline cardiomegaly. Atherosclerosis in the transverse aorta. No pleural effusion  or pneumothorax. No congestive failure. Mild left base scarring. IMPRESSION: No acute cardiopulmonary disease. Aortic Atherosclerosis (ICD10-I70.0). Electronically Signed   By: Abigail Miyamoto M.D.   On: 05/28/2019 13:55   DG Lumbar Spine Complete  Result Date: 05/28/2019 CLINICAL DATA:  Fall. EXAM: LUMBAR SPINE - COMPLETE 4+ VIEW COMPARISON:  October 08, 2014. FINDINGS: Diffuse osteopenia is noted. Moderate degenerative disc disease is noted at L1-2 and L2-3. Minimal retrolisthesis of L2-3 is noted. Mild degenerative disc disease is noted at L5-S1. No fracture is noted. IMPRESSION: Multilevel degenerative disc disease. No acute abnormality seen in the lumbar spine. Electronically Signed   By: Marijo Conception M.D.   On: 05/28/2019 16:44   CT Head Wo Contrast  Result Date: 05/28/2019 CLINICAL DATA:  Altered mental status, ataxia EXAM: CT HEAD WITHOUT CONTRAST TECHNIQUE: Contiguous axial images were obtained from the base of the skull through the vertex without intravenous contrast. COMPARISON:  08/02/2005, 06/04/2014 FINDINGS: Brain: No evidence of acute infarction, hemorrhage, hydrocephalus, extra-axial collection or mass lesion/mass effect. Focal encephalomalacia the left basal ganglia related to prior lacunar infarct. Scattered low-density changes within the periventricular and subcortical white matter compatible with chronic microvascular ischemic change. Moderate diffuse cerebral volume loss. Vascular: Atherosclerotic calcifications involving the large vessels of the skull base. No unexpected hyperdense vessel. Skull: Normal. Negative for fracture or focal lesion. Sinuses/Orbits: No acute finding. Other: None. IMPRESSION: 1.  No acute intracranial findings. 2.  Chronic microvascular ischemic change and cerebral volume loss. Electronically Signed   By: Davina Poke D.O.   On: 05/28/2019 12:19   CT ANGIO NECK W OR WO CONTRAST  Result Date: 05/28/2019 CLINICAL DATA:  Follow-up examination for acute  stroke. EXAM: CT ANGIOGRAPHY HEAD AND NECK TECHNIQUE: Multidetector CT imaging of the head and neck was performed using the standard protocol during bolus administration of intravenous contrast. Multiplanar CT image reconstructions and MIPs were obtained to evaluate the vascular anatomy. Carotid stenosis measurements (when applicable) are obtained utilizing NASCET criteria, using the distal internal carotid diameter as the denominator. CONTRAST:  113mL OMNIPAQUE IOHEXOL 350 MG/ML SOLN COMPARISON:  Prior CT and MRI from earlier the same day. FINDINGS: CTA NECK FINDINGS Aortic arch: Visualized aortic arch a within normal limits for caliber. Bovine branch pattern with common origin of the right brachiocephalic and left common carotid artery noted. Mild atheromatous plaque within the arch itself. No hemodynamically significant stenosis about the origin of the great vessels. Visualized subclavian arteries patent without flow-limiting stenosis. Right carotid system: Right CCA tortuous proximally but is widely patent to the bifurcation without stenosis. Mixed eccentric plaque about the right bifurcation without hemodynamically significant stenosis. Probable changes related to previous right carotid endarterectomy. Right ICA widely patent distally to the skull base  without stenosis, dissection, or occlusion eccentric plaque at the cervical petrous junction at the skull base with short-segment mild stenosis noted (series 6, image 225). Calcified atherosclerotic plaque seen within this region on prior noncontrast head CT. Left carotid system: Left CCA tortuous proximally but is widely patent to the bifurcation without significant stenosis. Bulky calcified plaque about the left bifurcation/proximal left ICA with associated stenosis of up to 50% by NASCET criteria. Left ICA mildly tortuous but otherwise widely patent distally to the skull base without stenosis, dissection or occlusion. Vertebral arteries: Both vertebral  arteries arise from the subclavian arteries. Focal plaque at the origin of the right vertebral artery with mild ostial stenosis. Left vertebral artery dominant. Vertebral arteries mildly tortuous but otherwise widely patent within the neck without stenosis, dissection or occlusion. Skeleton: No visible acute osseous abnormality. No worrisome lytic or blastic osseous lesions. Dens is somewhat angulated, nonspecific, but could be related to remotely healed trauma. Moderate cervical spondylosis present at C5-6 and C6-7. Other neck: No other acute soft tissue abnormality within the neck. No mass lesion or adenopathy. 7 mm right thyroid nodule noted, of doubtful significance given size and patient age. No follow-up imaging recommended regarding this lesion. Upper chest: Visualized upper chest demonstrates no acute finding. Scattered predominantly subpleural reticular linear densities could reflect atelectasis, scarring, or possibly fibrosis. Left-sided pacemaker/AICD noted. Review of the MIP images confirms the above findings CTA HEAD FINDINGS Anterior circulation: Petrous segments widely patent. Scattered atheromatous plaque throughout the cavernous/supraclinoid ICAs bilaterally. Associated short-segment severe stenosis seen at the supraclinoid right ICA (series 6, image 265). Moderate to severe short-segment stenosis present at the para clinoid left ICA as well (series 6, image 263). ICA termini well perfused. A1 segments patent bilaterally. Normal anterior communicating artery complex. Anterior cerebral arteries patent to their distal aspects without stenosis. No M1 stenosis or occlusion. Normal MCA bifurcations. Distal MCA branches well perfused and symmetric. Posterior circulation: Dominant left vertebral artery widely patent to the vertebrobasilar junction without stenosis. Mild atheromatous irregularity within the right vertebral artery without stenosis. Both picas patent. Basilar widely patent to its distal  aspect without stenosis. Superior cerebral arteries patent bilaterally. Both PCAs primarily supplied via the basilar. Multifocal moderate to severe bilateral P2 stenoses noted. PCAs remain well perfused to their distal aspects. Venous sinuses: Patent allowing for timing the contrast bolus. Anatomic variants: None significant. 2 mm focal outpouching arising from the supraclinoid left ICA demonstrates a tiny vessel extending from its apex, felt to be most consistent with a vascular infundibulum related to a hypoplastic left posterior communicating artery. No visible aneurysm. Review of the MIP images confirms the above findings IMPRESSION: 1. Negative CTA for large vessel occlusion. 2. Bulky atheromatous plaque about the left carotid bifurcation with associated 50% stenosis by NASCET criteria. 3. Prior right carotid endarterectomy without significant residual or recurrent stenosis. 4. Moderate to severe bilateral stenoses involving the supraclinoid ICAs bilaterally, right worse than left. 5. Moderate to severe multifocal bilateral P2 stenoses. Electronically Signed   By: Jeannine Boga M.D.   On: 05/28/2019 22:23   MR BRAIN WO CONTRAST  Result Date: 05/28/2019 CLINICAL DATA:  Ataxia, stroke suspected. EXAM: MRI HEAD WITHOUT CONTRAST TECHNIQUE: Multiplanar, multiecho pulse sequences of the brain and surrounding structures were obtained without intravenous contrast. COMPARISON:  Head CT 05/28/2019 and MRI 06/04/2014 FINDINGS: Brain: There is a small acute infarct involving the left corona radiata and basal ganglia posterior to a chronic infarct, and there is also a small acute infarct along  the lateral margin of the right thalamus. There is ex vacuo dilatation of the frontal horn of the left lateral ventricle. A few scattered small chronic cerebral hemorrhages are unchanged. T2 hyperintensities in the cerebral white matter bilaterally have slightly progressed from the prior MRI and are nonspecific but  compatible with mild chronic small vessel ischemic disease. No mass, midline shift, or extra-axial fluid collection is identified. There is moderate cerebral and cerebellar atrophy. Vascular: Major intracranial vascular flow voids are preserved. Skull and upper cervical spine: Unremarkable bone marrow signal. Sinuses/Orbits: Unremarkable orbits. Minimal mucosal thickening in the paranasal sinuses. Clear paranasal sinuses. Other: Postoperative changes to the right parietal scalp. IMPRESSION: 1. Small acute infarcts in the left basal ganglia/corona radiata and right thalamus. 2. Chronic small vessel ischemia and moderate cerebral atrophy. Electronically Signed   By: Logan Bores M.D.   On: 05/28/2019 15:41   DG CHEST PORT 1 VIEW  Result Date: 05/29/2019 CLINICAL DATA:  Shortness of breath EXAM: PORTABLE CHEST 1 VIEW COMPARISON:  05/28/2019 FINDINGS: Single frontal view of the chest demonstrates stable dual lead pacemaker. Cardiac silhouette is unremarkable. No airspace disease, effusion, or pneumothorax. Minimal atherosclerosis within the aorta. No acute bony abnormalities. IMPRESSION: 1. Stable exam, no acute process. Electronically Signed   By: Randa Ngo M.D.   On: 05/29/2019 15:27   ECHOCARDIOGRAM COMPLETE  Result Date: 05/29/2019    ECHOCARDIOGRAM REPORT   Patient Name:   William Mann Date of Exam: 05/29/2019 Medical Rec #:  [id number]           Height:       68.0 in Accession #:    [id number]          Weight:       150.0 lb Date of Birth:  October 21, 1929           BSA:          1.809 m Patient Age:    11 years            BP:           158/99 mmHg Patient Gender: M                   HR:           83 bpm. Exam Location:  Inpatient Procedure: 2D Echo, Cardiac Doppler, Color Doppler and Intracardiac            Opacification Agent Indications:    Stroke 434.91/I163.9  History:        Patient has prior history of Echocardiogram examinations, most                 recent 12/21/2018. Pacemaker. Heart block.   Sonographer:    Clayton Lefort RDCS (AE) Referring Phys: [id number] Lequita Halt  Sonographer Comments: Technically difficult study due to poor echo windows, Technically challenging study due to limited acoustic windows, suboptimal parasternal window, suboptimal apical window and no subcostal window. Limited patient mobility. IMPRESSIONS  1. Left ventricular ejection fraction, by estimation, is 55%. The left ventricle has normal function. The left ventricle has no regional wall motion abnormalities. Left ventricular diastolic parameters are consistent with Grade I diastolic dysfunction (impaired relaxation).  2. Right ventricular systolic function is normal. The right ventricular size is normal. Tricuspid regurgitation signal is inadequate for assessing PA pressure.  3. Left atrial size was mildly dilated.  4. The mitral valve is normal in structure. No evidence of mitral valve regurgitation. No evidence of mitral  stenosis.  5. The aortic valve is tricuspid. Aortic valve regurgitation is not visualized. Mild aortic valve sclerosis is present, with no evidence of aortic valve stenosis.  6. Aortic dilatation noted. There is mild dilatation of the ascending aorta measuring 41 mm.  7. Technically difficult study with poor acoustic windows. FINDINGS  Left Ventricle: Left ventricular ejection fraction, by estimation, is 55%. The left ventricle has normal function. The left ventricle has no regional wall motion abnormalities. Definity contrast agent was given IV to delineate the left ventricular endocardial borders. The left ventricular internal cavity size was normal in size. There is no left ventricular hypertrophy. Left ventricular diastolic parameters are consistent with Grade I diastolic dysfunction (impaired relaxation). Right Ventricle: The right ventricular size is normal. No increase in right ventricular wall thickness. Right ventricular systolic function is normal. Tricuspid regurgitation signal is inadequate for  assessing PA pressure. Left Atrium: Left atrial size was mildly dilated. Right Atrium: Right atrial size was normal in size. Pericardium: There is no evidence of pericardial effusion. Mitral Valve: The mitral valve is normal in structure. Moderate mitral annular calcification. No evidence of mitral valve regurgitation. No evidence of mitral valve stenosis. Tricuspid Valve: The tricuspid valve is normal in structure. Tricuspid valve regurgitation is not demonstrated. Aortic Valve: The aortic valve is tricuspid. Aortic valve regurgitation is not visualized. Mild aortic valve sclerosis is present, with no evidence of aortic valve stenosis. Aortic valve mean gradient measures 4.0 mmHg. Aortic valve peak gradient measures 7.2 mmHg. Aortic valve area, by VTI measures 2.07 cm. Pulmonic Valve: The pulmonic valve was normal in structure. Pulmonic valve regurgitation is not visualized. Aorta: Aortic dilatation noted. There is mild dilatation of the ascending aorta measuring 41 mm. Venous: The inferior vena cava was not well visualized. IAS/Shunts: No atrial level shunt detected by color flow Doppler. Additional Comments: A pacer wire is visualized in the right ventricle.  LEFT VENTRICLE PLAX 2D LVIDd:         4.29 cm LVIDs:         3.11 cm LV PW:         1.00 cm LV IVS:        1.07 cm LVOT diam:     1.80 cm LV SV:         50 LV SV Index:   28 LVOT Area:     2.54 cm  RIGHT VENTRICLE RV S prime:     11.30 cm/s TAPSE (M-mode): 1.9 cm LEFT ATRIUM           Index LA diam:      3.70 cm 2.05 cm/m LA Vol (A2C): 22.9 ml 12.66 ml/m LA Vol (A4C): 50.9 ml 28.14 ml/m  AORTIC VALVE AV Area (Vmax):    2.16 cm AV Area (Vmean):   1.99 cm AV Area (VTI):     2.07 cm AV Vmax:           134.00 cm/s AV Vmean:          94.400 cm/s AV VTI:            0.242 m AV Peak Grad:      7.2 mmHg AV Mean Grad:      4.0 mmHg LVOT Vmax:         114.00 cm/s LVOT Vmean:        73.900 cm/s LVOT VTI:          0.197 m LVOT/AV VTI ratio: 0.81  AORTA Ao Root  diam: 3.60 cm Ao  Asc diam:  4.10 cm MITRAL VALVE MV Area (PHT): 4.80 cm     SHUNTS MV Decel Time: 158 msec     Systemic VTI:  0.20 m MV E velocity: 71.70 cm/s   Systemic Diam: 1.80 cm MV A velocity: 157.00 cm/s MV E/A ratio:  0.46 Loralie Champagne MD Electronically signed by Loralie Champagne MD Signature Date/Time: 05/29/2019/5:20:01 PM    Final    VAS [place] LOWER EXTREMITY VENOUS (DVT)  Result Date: 05/29/2019  Lower Venous DVTStudy Indications: Stroke.  Risk Factors: DVT. Anticoagulation: Heparin. Comparison Study: 07/18/2018 - Right chronic DVT CFV, FV, PopV Performing Technologist: Oliver Hum RVT  Examination Guidelines: A complete evaluation includes B-mode imaging, spectral Doppler, color Doppler, and power Doppler as needed of all accessible portions of each vessel. Bilateral testing is considered an integral part of a complete examination. Limited examinations for reoccurring indications may be performed as noted. The reflux portion of the exam is performed with the patient in reverse Trendelenburg.  +---------+---------------+---------+-----------+----------+-----------------+ RIGHT    CompressibilityPhasicitySpontaneityPropertiesThrombus Aging    +---------+---------------+---------+-----------+----------+-----------------+ CFV      Full           Yes      Yes                                    +---------+---------------+---------+-----------+----------+-----------------+ SFJ      Full                                                           +---------+---------------+---------+-----------+----------+-----------------+ FV Prox  Partial        Yes      Yes                  Chronic           +---------+---------------+---------+-----------+----------+-----------------+ FV Mid   Partial        Yes      Yes                  Chronic           +---------+---------------+---------+-----------+----------+-----------------+ FV DistalPartial        Yes      Yes                   Chronic           +---------+---------------+---------+-----------+----------+-----------------+ PFV      Full                                                           +---------+---------------+---------+-----------+----------+-----------------+ POP      Full           No       No                                     +---------+---------------+---------+-----------+----------+-----------------+ PTV      Full                                                           +---------+---------------+---------+-----------+----------+-----------------+  PERO     Full                                                           +---------+---------------+---------+-----------+----------+-----------------+ Gastroc  None                                         Age Indeterminate +---------+---------------+---------+-----------+----------+-----------------+   +---------+---------------+---------+-----------+----------+--------------+ LEFT     CompressibilityPhasicitySpontaneityPropertiesThrombus Aging +---------+---------------+---------+-----------+----------+--------------+ CFV      Full           Yes      Yes                                 +---------+---------------+---------+-----------+----------+--------------+ SFJ      Full                                                        +---------+---------------+---------+-----------+----------+--------------+ FV Prox  Full                                                        +---------+---------------+---------+-----------+----------+--------------+ FV Mid   Full                                                        +---------+---------------+---------+-----------+----------+--------------+ FV DistalFull                                                        +---------+---------------+---------+-----------+----------+--------------+ PFV      Full                                                         +---------+---------------+---------+-----------+----------+--------------+ POP      Full           Yes      Yes                                 +---------+---------------+---------+-----------+----------+--------------+ PTV      Full                                                        +---------+---------------+---------+-----------+----------+--------------+  PERO     Full                                                        +---------+---------------+---------+-----------+----------+--------------+     Summary: RIGHT: - Findings consistent with age indeterminate deep vein thrombosis involving the right gastrocnemius veins. - Findings consistent with chronic deep vein thrombosis involving the right femoral vein. - No cystic structure found in the popliteal fossa.  LEFT: - There is no evidence of deep vein thrombosis in the lower extremity.  - No cystic structure found in the popliteal fossa.  *See table(s) above for measurements and observations. Electronically signed by Monica Martinez MD on 05/29/2019 at 8:07:48 PM.    Final     Assessment/Plan: Diagnosis: acute infarcts in left basal ganglia and corona radiata and right thalamus Stroke: Continue secondary stroke prophylaxis and Risk Factor Modification listed below:   Antiplatelet therapy:   Blood Pressure Management:  Continue current medication with prn's with permisive HTN per primary team Statin Agent:   PT/OT for mobility, ADL training  Labs and images (see above) independently reviewed.  Records reviewed and summated above.  1. Does the need for close, 24 hr/day medical supervision in concert with the patient's rehab needs make it unreasonable for this patient to be served in a less intensive setting? Yes  2. Co-Morbidities requiring supervision/potential complications: CAD, DVT (transitioned from heparin GGT-current level elevated, to oral meds when possible), PPM, fatty liver (avoid hepatotoxic meds),?   Dementia (confusion prior to admission per son), Covid 19 (3 months ago), Tachycardia (monitor in accordance with pain and increasing activity), HTN (monitor and provide prns in accordance with increased physical exertion and pain), leukocytosis (repeat labs, cont to monitor for signs and symptoms of infection, further workup if indicated) 3. Due to bladder management, bowel management, safety, skin/wound care, disease management, medication administration and patient education, does the patient require 24 hr/day rehab nursing? Yes 4. Does the patient require coordinated care of a physician, rehab nurse, therapy disciplines of PT/OT/SLP to address physical and functional deficits in the context of the above medical diagnosis(es)? Yes Addressing deficits in the following areas: balance, endurance, locomotion, strength, transferring, bowel/bladder control, bathing, dressing, feeding, grooming, toileting, cognition, speech, language, swallowing and psychosocial support 5. Can the patient actively participate in an intensive therapy program of at least 3 hrs of therapy per day at least 5 days per week? Potentially 6. The potential for patient to make measurable gains while on inpatient rehab is excellent 7. Anticipated functional outcomes upon discharge from inpatient rehab are min assist  with PT, min assist and mod assist with OT, min assist with SLP. 8. Estimated rehab length of stay to reach the above functional goals is: 17-20 days. 9. Anticipated discharge destination: Home 10. Overall Rehab/Functional Prognosis: good  RECOMMENDATIONS: This patient's condition is appropriate for continued rehabilitative care in the following setting: CIR Patient has agreed to participate in recommended program. Potentially Note that insurance prior authorization may be required for reimbursement for recommended care.  Comment: Rehab Admissions Coordinator to follow up.  I have personally performed a face to face  diagnostic evaluation, including, but not limited to relevant history and physical exam findings, of this patient and developed relevant assessment and plan.  Additionally, I have reviewed and concur with the physician assistant's documentation  above.   Delice Lesch, MD, ABPMR Bary Leriche, PA-C 05/30/2019

## 2019-05-30 NOTE — Progress Notes (Addendum)
Initial Nutrition Assessment  DOCUMENTATION CODES:   Not applicable  INTERVENTION:  Via Cortrak: Initiate Osmolite 1.2 cal @ 60ml/hr, advance 71ml/hr Q4H until goal rate of 9ml/hr (1460ml) is reached  75ml Pro-stat daily  121ml free water Q4H  At goal, tube feeding will provide 1828 kcal, 95 grams protein, 1140ml free water (1863ml with free water flushes)  NUTRITION DIAGNOSIS:   Inadequate oral intake related to inability to eat as evidenced by NPO status.   GOAL:   Patient will meet greater than or equal to 90% of their needs   MONITOR:   Labs, Weight trends, TF tolerance, I & O's  REASON FOR ASSESSMENT:   Consult Enteral/tube feeding initiation and management  ASSESSMENT:   Pt with a PMH significant for PVD, HTN, HLD, DVT, PPM implant, carotid stenosis s/p right carotid endarterectomy, COVID-19 02/2019. Pt admitted with bilateral CVA.   4/22 - failed BSE 4/23 - failed MBS, Cortrak placed (tip in stomach)  Discussed pt with RN.  Pt sleeping at time of RD visit. Pt's wife in room and provided history. Per pt's wife, he would eat a large bowl of cereal for breakfast and would then eat meat, potatoes, and salad for an early dinner. Pt would occasionally snack in between meals. RD answered wife's questions regarding tube feeding.  Per wt readings, pt's wt stable over the last year. Unable to obtain wt history from pt/his wife; pt's wife received an important phone call.   No PO Intake documented.  Labs and medications reviewed.  NUTRITION - FOCUSED PHYSICAL EXAM:  Deferred; will attempt at follow-up.  Diet Order:   Diet Order            Diet NPO time specified  Diet effective now              EDUCATION NEEDS:   No education needs have been identified at this time  Skin:  Skin Assessment: Reviewed RN Assessment  Last BM:  4/22 type 4  Height:   Ht Readings from Last 1 Encounters:  05/27/19 5\' 8"  (1.727 m)    Weight:   Wt Readings from  Last 1 Encounters:  05/27/19 68 kg    BMI:  There is no height or weight on file to calculate BMI.  Estimated Nutritional Needs:   Kcal:  1700-1900  Protein:  85-95 grams  Fluid:  >1.7L/d    Larkin Ina, MS, RD, LDN RD pager number and weekend/on-call pager number located in [place].

## 2019-05-30 NOTE — Procedures (Signed)
Cortrak  Person Inserting Tube:  Rosezetta Schlatter, RD Tube Type:  Cortrak - 43 inches Tube Location:  Left nare Initial Placement:  Stomach Secured by: Bridle Technique Used to Measure Tube Placement:  Documented cm marking at nare/ corner of mouth Cortrak Secured At:  69 cm Procedure Comments:  Cortrak Tube Team Note:  Consult received to place a Cortrak feeding tube.   No x-ray is required. RN may begin using tube.    If the tube becomes dislodged please keep the tube and contact the Cortrak team at [email or web address] ([email or web address]) for replacement.  If after hours and replacement cannot be delayed, place a NG tube and confirm placement with an abdominal x-ray.      Jarome Matin, MS, RD, LDN, CNSC Inpatient Clinical Dietitian RD pager # available in [place]  After hours/weekend pager # available in [facility]

## 2019-05-30 NOTE — Progress Notes (Signed)
[place] for Heparin Indication: VTE Treatment on Apixaban PTA   Allergies  Allergen Reactions  . Atorvastatin Other (See Comments)    Liver inflammation    . Rosuvastatin Other (See Comments)    Muscle pain  . Viagra [Sildenafil] Other (See Comments)    Reaction ??    Patient Measurements: TBW: 68 kg Height: 68 inches Heparin Dosing Weight: 68 kg  Vital Signs: Temp: 98.8 F (37.1 C) (04/23 1953) Temp Source: Oral (04/23 1953) BP: 142/96 (04/23 1953) Pulse Rate: 90 (04/23 1953)  Labs: Recent Labs    05/28/19 1154 05/28/19 1154 05/28/19 1216 05/28/19 1216 05/28/19 2000 05/29/19 0256 05/29/19 0256 05/29/19 1147 05/29/19 1147 05/29/19 1255 05/29/19 1537 05/29/19 2159 05/30/19 0707  HGB 15.2   < > 15.0   < >  --   --   --  15.5   < >  --  15.3  --  16.1  HCT 45.9   < > 44.0   < >  --   --   --  45.9  --   --  45.0  --  46.6  PLT 174  --   --   --   --   --   --  180  --   --   --   --  199  APTT 31  --   --   --    < > 54*   < >  --   --  85*  --  75* 73*  LABPROT 14.5  --   --   --   --   --   --   --   --   --   --   --   --   INR 1.1  --   --   --   --   --   --   --   --   --   --   --   --   HEPARINUNFRC  --   --   --   --    < > 0.75*  --   --   --  1.05*  --   --  0.79*  CREATININE 0.85   < > 0.70  --   --   --   --  0.75  --   --   --   --  0.79   < > = values in this interval not displayed.    Estimated Creatinine Clearance: 61.4 mL/min (by C-G formula based on SCr of 0.79 mg/dL).   Medical History: Past Medical History:  Diagnosis Date  . Aortic atherosclerosis ([place])   . Bilateral hearing loss   . BPH (benign prostatic hyperplasia)   . Cancer ([place])    basal cell carcinoma  . Carotid artery occlusion   . Constipation   . Coronary artery disease   . COVID-19   . DVT (deep venous thrombosis) ([place])   . Emphysema lung ([place])   . Fatty liver   . GERD (gastroesophageal reflux disease)   . History of  echocardiogram    Echo 4/17: Vigorous LVEF, EF 65-70%, normal wall motion, grade 1 diastolic dysfunction, mildly dilated ascending aorta (40 mm)  . Hyperlipidemia   . Hypertension   . Insomnia   . PVD (peripheral vascular disease) ([place])   . Thyroid nodule     Assessment: 86 yr old male presented to the ED after an abnormal MRI showed small acute infarcts in the  left basal ganglia. Patient was on Apixaban 5 mg po BID PTA for recent DVT. Neuro recommended to continue anticoagulation. The admitting team has requested that pharmacy dose heparin at this time in place of the patient's home Apixaban.   Pt is receiving heparin 1150 units/hr; last aPTT (0707 this AM) was 73 sec, which is within the goal range for this pt. H/H, platelets WNL. Per RN, no issues with IV or bleeding.   Pharmacy is now consulted to transition pt to apixaban (Cortrak inserted today). Wt 68 kg, 86 yr old, Scr 0.79  Goal of Therapy:  Heparin level 0.3-0.5 units/mL APTT 66-84 secs Monitor platelets by anticoagulation protocol: Yes   Plan:  Discontinue heparin infusion at 2100, and start apixaban 5 mg po BID this evening Monitor CBC Monitor for signs/symptoms of bleeding  Gillermina Hu, PharmD, BCPS, [facility] Clinical Pharmacist 04/29/19, 20:26 PM

## 2019-05-30 NOTE — Evaluation (Signed)
Speech Language Pathology Evaluation Patient Details Name: William Mann MRN: [id number] DOB: 05-25-1929 Today's Date: 05/30/2019 Time: 1324-4010 SLP Time Calculation (min) (ACUTE ONLY): 9 min  Problem List:  Patient Active Problem List   Diagnosis Date Noted  . Coronary artery disease involving native coronary artery of native heart without angina pectoris   . Deep venous thrombosis (HCC)   . Fatty liver   . Dementia without behavioral disturbance (HCC)   . Tachycardia   . Hypertension   . Leukocytosis   . Stroke (cerebrum) (HCC) 05/28/2019  . CVA (cerebral vascular accident) (HCC) 05/28/2019  . Pacemaker 03/24/2019  . Complete heart block (HCC) 12/20/2018  . Aftercare following surgery of the circulatory system 02/17/2014  . Bilateral carotid artery occlusion 02/17/2014  . Aftercare following surgery of the circulatory system, NEC 01/17/2013  . Numbness and tingling 02/27/2012  . Peripheral vascular disease, unspecified (HCC) 02/27/2012  . Occlusion and stenosis of carotid artery without mention of cerebral infarction 03/14/2011   Past Medical History:  Past Medical History:  Diagnosis Date  . Aortic atherosclerosis (HCC)   . Bilateral hearing loss   . BPH (benign prostatic hyperplasia)   . Cancer (HCC)    basal cell carcinoma  . Carotid artery occlusion   . Constipation   . Coronary artery disease   . COVID-19   . DVT (deep venous thrombosis) (HCC)   . Emphysema lung (HCC)   . Fatty liver   . GERD (gastroesophageal reflux disease)   . History of echocardiogram    Echo 4/17: Vigorous LVEF, EF 65-70%, normal wall motion, grade 1 diastolic dysfunction, mildly dilated ascending aorta (40 mm)  . Hyperlipidemia   . Hypertension   . Insomnia   . PVD (peripheral vascular disease) (HCC)   . Thyroid nodule    Past Surgical History:  Past Surgical History:  Procedure Laterality Date  . APPENDECTOMY    . CAROTID ENDARTERECTOMY  03-05-09   right CEA  . COLONOSCOPY   Oct. 2013   one polyp  . HERNIA REPAIR    . LIPOMA EXCISION    . PACEMAKER IMPLANT N/A 12/23/2018   Procedure: PACEMAKER IMPLANT;  Surgeon: Marinus Maw, MD;  Location: [facility] INVASIVE CV LAB;  Service: Cardiovascular;  Laterality: N/A;   HPI:  William Mann is a 86 y.o. male with medical history significant of thyroid nodule, emphysema, GERD, right carotid endarterectomy,PVD, hypertension, hyperlipidemia, lower extremity DVT, bilateral hearing loss, presented with new onset of slurred speech and facial droop. Per chart wife also noticed significant right leg weakness, right-sided facial droop, and cough/choking after eating drink since last night. MRI shows small acute infarcts in the left basal ganglia/corona radiata and right thalamus. CXR 4/21 No acute cardiopulmonary disease   Assessment / Plan / Recommendation Clinical Impression  SLP worked pt yesterday during bedside swallow assessment in addition to today. Increased confusion noted today with possible delirium. Pt's dysarthria has worsened and only 2-3 words intelligible at phrase/sentence level. Difficult to fully assess expressive language abilities due to dysathria. He comprehends basic information and commands in context to situation. Today he had difficulty attending to SLP and needed frequent cueing. ST will continue for language, cognition and dysarthria and additional diagnostic treatment of language.        SLP Assessment  SLP Recommendation/Assessment: Patient needs continued Speech Lanaguage Pathology Services SLP Visit Diagnosis: Cognitive communication deficit (R41.841);Dysarthria and anarthria (R47.1)    Follow Up Recommendations  Skilled Nursing facility    Frequency and  Duration min 2x/week  2 weeks      SLP Evaluation Cognition  Overall Cognitive Status: Impaired/Different from baseline Arousal/Alertness: Lethargic Orientation Level: Oriented to person;Oriented to place Attention: Sustained Sustained  Attention: Impaired Sustained Attention Impairment: Functional basic Memory: (TBA) Awareness: Impaired Awareness Impairment: Emergent impairment Problem Solving: Impaired Problem Solving Impairment: Functional basic Safety/Judgment: Impaired       Comprehension  Auditory Comprehension Overall Auditory Comprehension: (will further assess- functional for basic yesterday) Interfering Components: Attention Visual Recognition/Discrimination Discrimination: Not tested Reading Comprehension Reading Status: Not tested    Expression Expression Primary Mode of Expression: Verbal Verbal Expression Overall Verbal Expression: Impaired(will assess further) Initiation: No impairment Level of Generative/Spontaneous Verbalization: Phrase Pragmatics: Impairment Impairments: Eye contact Written Expression Dominant Hand: Left Written Expression: (TBD)   Oral / Motor  Oral Motor/Sensory Function Overall Oral Motor/Sensory Function: Moderate impairment Facial ROM: Reduced right;Suspected CN VII (facial) dysfunction Facial Symmetry: Abnormal symmetry right;Suspected CN VII (facial) dysfunction Facial Strength: Reduced right;Suspected CN VII (facial) dysfunction Facial Sensation: Reduced right;Suspected CN V (Trigeminal) dysfunction Motor Speech Overall Motor Speech: Impaired Respiration: Impaired Level of Impairment: Conversation Phonation: Low vocal intensity Resonance: Within functional limits Articulation: Impaired Level of Impairment: Word Intelligibility: Intelligibility reduced Word: 25-49% accurate Phrase: 0-24% accurate Motor Planning: Witnin functional limits(will assess further though)   GO                    Royce Macadamia 05/30/2019, 3:54 PM   Breck Coons Santos Hardwick M.Ed Nurse, children's [phone] Office [phone]

## 2019-05-30 NOTE — Evaluation (Signed)
Occupational Therapy Evaluation Patient Details Name: William Mann MRN: [id number] DOB: December 13, 1929 Today's Date: 05/30/2019    History of Present Illness William Mann is a 86 y.o. male with history of  R ICA stenosis s/p R CEA, PVD, hypertension, hyperlipidemia, lower extremity DVT on Eliquis, carotid artery occlusion, atherosclerosis and bilateral hearing loss presenting with dysarthria, facial droop, R leg weakness.  Found to have L BG/CR and R PLIC/thalamic infarcts.   Clinical Impression   PT admitted with L BG/ CR and R PLIC / thalamic infarcts. Pt currently with functional limitiations due to the deficits listed below (see OT problem list). Pt currently with balance and cognitive deficits. Pt requires increase time to coordinate R UE movement Pt will benefit from skilled OT to increase their independence and safety with adls and balance to allow discharge  CIR.     Follow Up Recommendations  CIR    Equipment Recommendations  3 in 1 bedside commode    Recommendations for Other Services Rehab consult     Precautions / Restrictions Precautions Precautions: Fall Restrictions Weight Bearing Restrictions: No      Mobility Bed Mobility Overal bed mobility: Needs Assistance Bed Mobility: Supine to Sit;Sit to Supine     Supine to sit: Mod assist     General bed mobility comments: able to initiate with trunk and reaching with L hand. pt able to keep L knee in flexion  Transfers Overall transfer level: Needs assistance Equipment used: Rolling walker (2 wheeled) Transfers: Sit to/from Stand Sit to Stand: Mod assist;From elevated surface         General transfer comment: pt requires (A) for safety cues but able to power up     Balance Overall balance assessment: Needs assistance Sitting-balance support: Feet supported;Single extremity supported Sitting balance-Leahy Scale: Poor Sitting balance - Comments: leaning L Postural control: Left lateral  lean Standing balance support: Bilateral upper extremity supported Standing balance-Leahy Scale: Poor Standing balance comment: requires RW with (A) to balance due to leaning to R                           ADL either performed or assessed with clinical judgement   ADL Overall ADL's : Needs assistance/impaired Eating/Feeding: NPO   Grooming: Wash/dry face;Minimal assistance;Sitting   Upper Body Bathing: Moderate assistance;Sitting   Lower Body Bathing: Sit to/from stand;Maximal assistance       Lower Body Dressing: Maximal assistance;Sit to/from stand               Functional mobility during ADLs: Moderate assistance;Rolling walker General ADL Comments: pt ambulating with sudden incontinence of bladder and no awareness. pt does have a male purewick still in place     Vision Baseline Vision/History: Wears glasses Wears Glasses: At all times Additional Comments: L gaze preference     Perception     Praxis      Pertinent Vitals/Pain Pain Assessment: Faces Faces Pain Scale: Hurts little more Pain Location: back Pain Descriptors / Indicators: Discomfort Pain Intervention(s): Repositioned     Hand Dominance Left   Extremity/Trunk Assessment Upper Extremity Assessment Upper Extremity Assessment: RUE deficits/detail RUE Deficits / Details: 3+ out 5 with decreased coordination   Lower Extremity Assessment Lower Extremity Assessment: Defer to PT evaluation   Cervical / Trunk Assessment Cervical / Trunk Assessment: Kyphotic   Communication Communication Communication: HOH   Cognition Arousal/Alertness: Awake/alert Behavior During Therapy: Flat affect Overall Cognitive Status: Impaired/Different from baseline  Area of Impairment: Orientation;Attention;Safety/judgement;Following commands                 Orientation Level: Disoriented to;Place;Time;Situation Current Attention Level: Focused   Following Commands: Follows one step commands with  increased time Safety/Judgement: Decreased awareness of deficits     General Comments: difficult to understand at times. pt does recognize family in room   General Comments  son present throughout session. pt eager to engage in session and needed cues for safety    Exercises     Shoulder Instructions      Home Living Family/patient expects to be discharged to:: Private residence Living Arrangements: Spouse/significant other;Other relatives(grandson) Available Help at Discharge: Family Type of Home: House Home Access: Level entry     Home Layout: One level               Home Equipment: Cane - single point;Shower seat;Walker - 2 wheels          Prior Functioning/Environment Level of Independence: Needs assistance  Gait / Transfers Assistance Needed: used walker or cane to ambulate in the home ADL's / Homemaking Assistance Needed: toileted sometimes on his own, wife helps sometimes and for showers   Comments: pt reports independent, unreliable historian        OT Problem List: Decreased strength;Impaired balance (sitting and/or standing);Decreased activity tolerance;Decreased safety awareness;Decreased knowledge of use of DME or AE;Decreased knowledge of precautions;Decreased cognition;Decreased coordination      OT Treatment/Interventions: Self-care/ADL training;Therapeutic exercise;Neuromuscular education;Energy conservation;DME and/or AE instruction;Manual therapy;Modalities;Therapeutic activities;Cognitive remediation/compensation;Patient/family education;Balance training    OT Goals(Current goals can be found in the care plan section) Acute Rehab OT Goals Patient Stated Goal: to go to rehab OT Goal Formulation: With patient/family Time For Goal Achievement: 06/13/19 Potential to Achieve Goals: Good  OT Frequency: Min 2X/week   Barriers to D/C: Decreased caregiver support          Co-evaluation PT/OT/SLP Co-Evaluation/Treatment: Yes Reason for  Co-Treatment: For patient/therapist safety;To address functional/ADL transfers   OT goals addressed during session: ADL's and self-care;Strengthening/ROM      AM-PAC OT "6 Clicks" Daily Activity     Outcome Measure Help from another person eating meals?: A Lot Help from another person taking care of personal grooming?: A Lot Help from another person toileting, which includes using toliet, bedpan, or urinal?: A Lot Help from another person bathing (including washing, rinsing, drying)?: A Lot Help from another person to put on and taking off regular upper body clothing?: A Lot Help from another person to put on and taking off regular lower body clothing?: A Lot 6 Click Score: 12   End of Session Equipment Utilized During Treatment: Gait belt;Rolling walker Nurse Communication: Mobility status;Precautions  Activity Tolerance: Patient tolerated treatment well Patient left: in chair;in CPM;with chair alarm set;with family/visitor present  OT Visit Diagnosis: Unsteadiness on feet (R26.81);Muscle weakness (generalized) (M62.81)                Time: [id number] OT Time Calculation (min): 31 min Charges:  OT General Charges $OT Visit: 1 Visit OT Evaluation $OT Eval Moderate Complexity: 1 Mod   Brynn, OTR/L  Acute Rehabilitation Services Pager: [phone] Office: [phone] .   Jeri Modena 05/30/2019, 12:55 PM

## 2019-05-30 NOTE — Progress Notes (Signed)
Inpatient Rehab Admissions Coordinator Note:   Met with patient, son, and wife at bedside. Discussed possible CIR admit and they expressed interest in admit. Pt. Is not medically ready for admit and I have no beds today or for the weekend. Will follow for possible admit next week.   Clemens Catholic, [place], [place] Admissions Coordinator  [phone] ([place]) [phone] (office)

## 2019-05-30 NOTE — Progress Notes (Signed)
Inpatient Rehab Admissions Coordinator Note:   Per PT recommendations, pt was screened for CIR candidacy by Shann Medal, PT, DPT.  At this time we are recommending an inpatient rehab consult.  I will place an order per our protocol.  Please contact me with questions.   Shann Medal, PT, DPT [phone] 05/30/19 9:33 AM

## 2019-05-31 LAB — COMPREHENSIVE METABOLIC PANEL
ALT: 26 U/L (ref 0–44)
AST: 53 U/L — ABNORMAL HIGH (ref 15–41)
Albumin: 3.1 g/dL — ABNORMAL LOW (ref 3.5–5.0)
Alkaline Phosphatase: 90 U/L (ref 38–126)
Anion gap: 10 (ref 5–15)
BUN: 22 mg/dL (ref 8–23)
CO2: 23 mmol/L (ref 22–32)
Calcium: 8.6 mg/dL — ABNORMAL LOW (ref 8.9–10.3)
Chloride: 106 mmol/L (ref 98–111)
Creatinine, Ser: 0.63 mg/dL (ref 0.61–1.24)
GFR calc Af Amer: >60 mL/min (ref >60)
GFR calc non Af Amer: >60 mL/min (ref >60)
Glucose, Bld: 126 mg/dL — ABNORMAL HIGH (ref 70–99)
Potassium: 3.4 mmol/L — ABNORMAL LOW (ref 3.5–5.1)
Sodium: 139 mmol/L (ref 135–145)
Total Bilirubin: 2 mg/dL — ABNORMAL HIGH (ref 0.3–1.2)
Total Protein: 6 g/dL — ABNORMAL LOW (ref 6.5–8.1)

## 2019-05-31 LAB — CBC WITH DIFFERENTIAL/PLATELET
Abs Immature Granulocytes: 0.1 10*3/uL — ABNORMAL HIGH (ref 0.00–0.07)
Basophils Absolute: 0 10*3/uL (ref 0.0–0.1)
Basophils Relative: 0 %
Eosinophils Absolute: 0 10*3/uL (ref 0.0–0.5)
Eosinophils Relative: 0 %
HCT: 44.7 % (ref 39.0–52.0)
Hemoglobin: 15.5 g/dL (ref 13.0–17.0)
Immature Granulocytes: 1 %
Lymphocytes Relative: 8 %
Lymphs Abs: 1.1 10*3/uL (ref 0.7–4.0)
MCH: 34.2 pg — ABNORMAL HIGH (ref 26.0–34.0)
MCHC: 34.7 g/dL (ref 30.0–36.0)
MCV: 98.7 fL (ref 80.0–100.0)
Monocytes Absolute: 1.3 10*3/uL — ABNORMAL HIGH (ref 0.1–1.0)
Monocytes Relative: 10 %
Neutro Abs: 10.8 10*3/uL — ABNORMAL HIGH (ref 1.7–7.7)
Neutrophils Relative %: 81 %
Platelets: 180 10*3/uL (ref 150–400)
RBC: 4.53 MIL/uL (ref 4.22–5.81)
RDW: 13.2 % (ref 11.5–15.5)
WBC: 13.3 10*3/uL — ABNORMAL HIGH (ref 4.0–10.5)
nRBC: 0 % (ref 0.0–0.2)

## 2019-05-31 LAB — GLUCOSE, CAPILLARY
Glucose-Capillary: 125 mg/dL — ABNORMAL HIGH (ref 70–99)
Glucose-Capillary: 129 mg/dL — ABNORMAL HIGH (ref 70–99)
Glucose-Capillary: 153 mg/dL — ABNORMAL HIGH (ref 70–99)
Glucose-Capillary: 127 mg/dL — ABNORMAL HIGH (ref 70–99)
Glucose-Capillary: 113 mg/dL — ABNORMAL HIGH (ref 70–99)

## 2019-05-31 MED ORDER — ZOLPIDEM TARTRATE 5 MG PO TABS
5.0000 mg | ORAL_TABLET | Freq: Every evening | ORAL | Status: DC | PRN
  Administered 2019-05-31 – 2019-06-12 (×6): 5 mg
  Filled 2019-05-31 (×6): qty 1

## 2019-05-31 NOTE — Progress Notes (Signed)
[facility] Progress Note  Patient: William Mann    [id number]  DOA: 05/28/2019     Date of Service: the patient was seen and examined on 05/31/2019  Chief complaint. Slurred speech.  Brief hospital course: Past medical history of PVD, HTN, HLD, DVT on Eliquis, PPM implant, carotid stenosis SP right carotid endarterectomy, COVID-25 February 2019.  Presents with slurred speech and facial droop as well as recurrent fall.  MRI brain positive for small acute infarct in left basal ganglia and corona radiata as well as right thalamus.  Patient is on therapeutic anticoagulation with Eliquis.  Neurology consulted.  Currently further plan is monitor for improvement in patient's condition and continue to engage regarding goals of care conversation.  Assessment and Plan: 1.  Bilateral CVA Dysarthria Neurology consulted. Out of the window.  For TPA. Already on anticoagulation prior to admission. 2D echo shows preserved EF no valvular normality. 81 mg aspirin and Eliquis p.o. after cortrack insertion Neurology suspecting supraclinoid ICA stenosis as a potential etiology for which we will avoid lower BP.  Long-term blood pressure goal 1 [id number] systolic.  2.  Recurrent fall Secondary to stroke. PT OT consulted. Recommending CIR. No acute fractures.  3.  History of DVT June 2020 Initially on IV heparin. Transition to Eliquis with tube  4.  Symptomatic bradycardia SP PPM implant Recurrent PVC NSVT Patient was treated with Lopressor. Monitor for hypotension in the setting of acute stroke.  5.  BPH Flomax ordered but currently holding due to n.p.o. status. If the patient has retention issues we will continue in and out catheterization and consider transitioning to Cardura although trying to avoid it to avoid low blood pressures.  6.  HLD Simvastatin ordered 40 mg.   7.  LVH Echocardiogram shows 55% EF no significant valvular abnormality.  8.  Macrocytosis H&H  relatively stable.  Monitor.  9.  Metabolic acidosis. Likely from p.o. intake. Lactic acid normal. ABG shows no hypercarbia. No acidosis as well on ABG. Currently resolved.  Monitor.  10.  Dysphagia. Currently inserting NG tube for feeding and medication.  Will monitor improvement.  11.  Insomnia. Patient was unable to sleep last night. We will add Ambien and monitor.  Diet: NPO.  On tube feeding. DVT Prophylaxis: Therapeutic Anticoagulation with Eliquis   Advance goals of care discussion: Full code  Family Communication: family was present at bedside, at the time of interview.  The pt provided permission to discuss medical plan with the family. Opportunity was given to ask question and all questions were answered satisfactorily.   Disposition:  Status is: Inpatient  Remains inpatient appropriate because:Inpatient level of care appropriate due to severity of illness   Dispo: The patient is from: Home              Anticipated d/c is to: CIR              Anticipated d/c date is: 2 days              Patient currently is not medically stable to d/c.         Subjective: No nausea no vomiting.  Unable to sleep last night.  No fever no chills.  No bowel movement so far.  Physical Exam: General:  lethargic oriented to time and place.  Appear in mild distress, affect flat in affect Eyes: PERRL ENT: Oral Mucosa Clear, dry  Neck: difficult to assess  JVD,  Cardiovascular: S1 and S2 Present, no Murmur,  Respiratory:  increased respiratory effort, Bilateral Air entry equal and Decreased, no Crackles, no wheezes Abdomen: Bowel Sound present, Soft and no tenderness,  Skin: no rash Extremities: no Pedal edema, no calf tenderness Neurologic: without any new focal findings although exam is limited due to patient's cooperation. Gait not checked due to patient safety concerns  Vitals:   05/30/19 2358 05/31/19 0354 05/31/19 0810 05/31/19 1303  BP: (!) 154/95 (!) 173/93 (!)  127/91 (!) 142/86  Pulse: 100 (!) 105 87 94  Resp: 18 16 16 19   Temp: 97.7 F (36.5 C) (!) 97.5 F (36.4 C) 98.3 F (36.8 C) 98 F (36.7 C)  TempSrc: Oral Oral Axillary Oral  SpO2: 97% 94% 96% 97%    Intake/Output Summary (Last 24 hours) at 05/31/2019 1709 Last data filed at 05/31/2019 1315 Gross per 24 hour  Intake 1086.98 ml  Output 825 ml  Net 261.98 ml   There were no vitals filed for this visit.  Data Reviewed: I have personally reviewed and interpreted daily labs, tele strips, imagings as discussed above. I reviewed all nursing notes, pharmacy notes, vitals, pertinent old records I have discussed plan of care as described above with RN and patient/family.  CBC: Recent Labs  Lab 05/27/19 1629 05/27/19 1629 05/28/19 1154 05/28/19 1154 05/28/19 1216 05/29/19 1147 05/29/19 1537 05/30/19 0707 05/31/19 0321  WBC 6.6  --  9.1  --   --  14.3*  --  15.8* 13.3*  NEUTROABS  --   --  7.3  --   --   --   --   --  10.8*  HGB 14.3   < > 15.2   < > 15.0 15.5 15.3 16.1 15.5  HCT 43.6   < > 45.9   < > 44.0 45.9 45.0 46.6 44.7  MCV 103.6*  --  104.1*  --   --  101.5*  --  100.0 98.7  PLT 160  --  174  --   --  180  --  199 180   < > = values in this interval not displayed.   Basic Metabolic Panel: Recent Labs  Lab 05/27/19 1629 05/27/19 1629 05/28/19 1154 05/28/19 1154 05/28/19 1216 05/29/19 1147 05/29/19 1537 05/30/19 0707 05/31/19 0321  NA 141   < > 141   < > 142 140 138 139 139  K 3.8   < > 3.9   < > 3.8 3.9 3.7 3.6 3.4*  CL 110   < > 111  --  110 107  --  105 106  CO2 22  --  22  --   --  16*  --  20* 23  GLUCOSE 135*   < > 104*  --  98 104*  --  115* 126*  BUN 17   < > 14  --  15 14  --  16 22  CREATININE 0.98   < > 0.85  --  0.70 0.75  --  0.79 0.63  CALCIUM 9.2  --  8.7*  --   --  8.5*  --  8.6* 8.6*  MG  --   --   --   --   --  1.9  --   --   --    < > = values in this interval not displayed.    Studies: No results found.  Scheduled Meds: . apixaban   5 mg Per Tube BID  . aspirin  81 mg Per Tube Daily  . feeding supplement (PRO-STAT SUGAR FREE  64)  30 mL Per Tube Daily  . free water  110 mL Per Tube Q4H  . multivitamin  15 mL Per Tube Daily  . simvastatin  40 mg Per Tube q1800  . sodium chloride  1 spray Each Nare QID  . tamsulosin  0.4 mg Oral Daily   Continuous Infusions: . feeding supplement (OSMOLITE 1.2 CAL) 1,000 mL (05/31/19 0926)   PRN Meds: acetaminophen **OR** acetaminophen (TYLENOL) oral liquid 160 mg/5 mL **OR** acetaminophen, metoprolol tartrate  Time spent: 35 minutes  Author: Berle Mull, MD [facility] 05/31/2019 5:09 PM  To reach On-call, see care teams to locate the attending and reach out to them via [email or web address]. If 7PM-7AM, please contact night-coverage If you still have difficulty reaching the attending provider, please page the [facility] (Director on Call) for [facility] on amion for assistance.

## 2019-05-31 NOTE — Discharge Instructions (Addendum)
Physical Therapy After a Stroke After a stroke, some people experience physical changes or problems. Physical therapy may be prescribed to help you recover and overcome problems such as:  Inability to move (paralysis) or weakness, typically affecting one side of the body.  Trouble with balance.  Pain, a pins and needles sensation, or numbness in certain parts of the body. You may also have difficulty feeling touch, pressure, or changes in temperature.  Involuntary muscle tightening (spasticity).  Stiffness in muscles and joints.  Altered coordination and reflexes. What causes physical disability after a stroke? A stroke can damage parts of your brain that control your body's normal functions, including your ability to move and to keep your balance. The types of physical problems you have will depend on how severe the stroke was and where it was located in the brain. Weakness or paralysis may affect just your fingers and hands, a whole leg or arm, or an entire side of your body. What is physical therapy? Physical therapy involves using exercises, stretches, and activities to help you regain movement and independence after your stroke. Physical therapy may focus on one or more of the following:  Range of motion. This can help with movement and reduce muscle stiffness.  Balance. This helps to lower your risk of falling.  Position changes or transfers, such as moving from sitting to standing or from a chair to a bed.  Coordination, such as getting an object from a shelf.  Muscle strength. Muscles may be strengthened with weights or by repeating certain motions.  Functional mobility. This may include stair training or learning how to use a wheelchair, walker, or cane.  Walking (gait training).  Activities of daily living, such as getting out of the car or buttoning a shirt. Why is physical therapy important? It is important to do exercises and follow your rehabilitation plan as  told by your physical therapist. Physical therapy can:  Help you regain independence.  Prevent injury from falls by building strength and balance.  Lower your risk of blood clots.  Lower your risk of skin sores (pressure injuries).  Increase physical activity and exercise. This may help lower your risk for another stroke.  Help reduce pain. When will therapy start and where will I have therapy? Your health care provider will decide when it is best for you to start therapy. In some cases, people start rehabilitation, including physical therapy, as soon as they are medically stable, which may be 24-48 hours after a stroke. Rehabilitation can take place in a few different places, based on your needs. It may take place in:  The hospital or an in-patient rehabilitation hospital.  An outpatient rehabilitation facility.  A long-term care facility.  A community rehabilitation clinic.  Your home. What are assistive devices? Assistive devices are tools to help you move, maintain balance, and manage daily tasks while recovering from a stroke. Your physical therapist may recommend and help you learn to use:  Equipment to help you move, such as wheelchairs, canes, or walkers.  Braces or splints to keep your arms, hands, legs, or feet in a comfortable and safe position.  Bathtub benches or grab bars to keep you safe in the bathroom.  Special utensils, bowls, and plates that allow you to eat with one hand. It is important to use these devices as told by your health care provider. Summary  After a stroke, some people may experience physical disabilities, such as weakness or paralysis, pain, or balance problems.  Physical  therapy involves exercises, stretches, and activities that help to improve your ability to move and to handle daily tasks.  Physical therapy exercises focus on restoring range of motion, balance, coordination, muscle strength, and the ability to move  (mobility).  Physical therapy can help you regain independence, prevent falls, and allow you to live a more active lifestyle after a stroke. This information is not intended to replace advice given to you by your health care provider. Make sure you discuss any questions you have with your health care provider. Document Revised: 05/16/2018 Document Reviewed: 05/01/2016 Elsevier Patient Education  [place] on my medicine - ELIQUIS (apixaban)  Why was Eliquis prescribed for you? Eliquis was prescribed for you to reduce the risk of a blood clot forming that can cause a stroke if you have a medical condition called atrial fibrillation (a type of irregular heartbeat).  What do You need to know about Eliquis ? Take your Eliquis TWICE DAILY - one tablet in the morning and one tablet in the evening with or without food. If you have difficulty swallowing the tablet whole please discuss with your pharmacist how to take the medication safely.  Take Eliquis exactly as prescribed by your doctor and DO NOT stop taking Eliquis without talking to the doctor who prescribed the medication.  Stopping may increase your risk of developing a stroke.  Refill your prescription before you run out.  After discharge, you should have regular check-up appointments with your healthcare provider that is prescribing your Eliquis.  In the future your dose may need to be changed if your kidney function or weight changes by a significant amount or as you get older.  What do you do if you miss a dose? If you miss a dose, take it as soon as you remember on the same day and resume taking twice daily.  Do not take more than one dose of ELIQUIS at the same time to make up a missed dose.  Important Safety Information A possible side effect of Eliquis is bleeding. You should call your healthcare provider right away if you experience any of the following: ? Bleeding from an injury or your nose that does  not stop. ? Unusual colored urine (red or dark brown) or unusual colored stools (red or black). ? Unusual bruising for unknown reasons. ? A serious fall or if you hit your head (even if there is no bleeding).  Some medicines may interact with Eliquis and might increase your risk of bleeding or clotting while on Eliquis. To help avoid this, consult your healthcare provider or pharmacist prior to using any new prescription or non-prescription medications, including herbals, vitamins, non-steroidal anti-inflammatory drugs (NSAIDs) and supplements.  This website has more information on Eliquis (apixaban): http://[email or web address].com/eliquis/home

## 2019-05-31 NOTE — Progress Notes (Signed)
Physical Therapy Treatment Patient Details Name: William Mann MRN: [id number] DOB: [birth year] Today's Date: 05/31/2019    History of Present Illness William Mann is a 86 y.o. male with history of  R ICA stenosis s/p R CEA, PVD, hypertension, hyperlipidemia, lower extremity DVT on Eliquis, carotid artery occlusion, atherosclerosis and bilateral hearing loss presenting with dysarthria, facial droop, R leg weakness.  Found to have L BG/CR and R PLIC/thalamic infarcts.    PT Comments    Patient continues to show some progression with functional mobility. Reduced assistance required with sit<>stand and bed mobility today. Performed multiple stands, worked on static standing and side stepping along bed as able. Following single step commands. Difficulty sequencing steps once upright today however. Patient will continue to benefit from skilled physical therapy services to further improve independence with functional mobility   Follow Up Recommendations  CIR     Equipment Recommendations  None recommended by PT    Recommendations for Other Services Rehab consult     Precautions / Restrictions Precautions Precautions: Fall Restrictions Weight Bearing Restrictions: No    Mobility  Bed Mobility Overal bed mobility: Needs Assistance Bed Mobility: Supine to Sit;Sit to Supine     Supine to sit: Mod assist Sit to supine: Min assist   General bed mobility comments: Mod assist to rise to EOB, able to scoot legs to Edge, truncal support and pulling through RUE as able, pushing up with LUE from bed. Min assist to position when returning to supine position in bed. Worked on scooting up which he was able to perform with min assist to head of bed.  Transfers Overall transfer level: Needs assistance Equipment used: 1 person hand held assist Transfers: Sit to/from Stand Sit to Stand: Min assist;From elevated surface         General transfer comment: Min assist performed 3  times and stood up to 2 minutes for longest duration. Hand over hand guidance. Holding rail of bed with Lt hand. Cues for upright posture and forward gaze. Took a few side steps to lt with tactile cues put unable to fully step without support. Good lateral scoot with buttock lift from bed.  Ambulation/Gait                 Stairs             Wheelchair Mobility    Modified Rankin (Stroke Patients Only) Modified Rankin (Stroke Patients Only) Pre-Morbid Rankin Score: Moderate disability Modified Rankin: Moderately severe disability     Balance Overall balance assessment: Needs assistance Sitting-balance support: Single extremity supported;Feet unsupported;Feet supported Sitting balance-Leahy Scale: Poor Sitting balance - Comments: drifting left frequently, required vc to correct posture;pt with bilat. feet floating off ground intermittently Postural control: Left lateral lean Standing balance support: Single extremity supported Standing balance-Leahy Scale: Poor Standing balance comment: Briefly standing with single UE support.                            Cognition Arousal/Alertness: Awake/alert Behavior During Therapy: Flat affect Overall Cognitive Status: Impaired/Different from baseline Area of Impairment: Orientation;Attention;Safety/judgement;Following commands                 Orientation Level: Disoriented to;Place;Time;Situation Current Attention Level: Focused   Following Commands: Follows one step commands with increased time Safety/Judgement: Decreased awareness of deficits     General Comments: difficult to understand at times. pt recognized wife;      Exercises General  Exercises - Lower Extremity Ankle Circles/Pumps: AROM;Both;10 reps;Supine Quad Sets: Strengthening;5 reps;Both;Supine Long Arc Quad: Strengthening;Both;5 reps;Seated Heel Slides: AAROM;Strengthening;Both;5 reps;Supine Hip ABduction/ADduction: Strengthening;Both;5  reps;Supine    General Comments General comments (skin integrity, edema, etc.): pt's wife and dtr present and encouraging pt.      Pertinent Vitals/Pain Pain Assessment: No/denies pain Faces Pain Scale: Hurts even more Pain Location: back Pain Descriptors / Indicators: Discomfort Pain Intervention(s): Monitored during session    Home Living                      Prior Function            PT Goals (current goals can now be found in the care plan section) Acute Rehab PT Goals Patient Stated Goal: to go to rehab PT Goal Formulation: With patient/family Time For Goal Achievement: 06/12/19 Potential to Achieve Goals: Fair Progress towards PT goals: Progressing toward goals    Frequency    Min 4X/week      PT Plan Current plan remains appropriate    Co-evaluation              AM-PAC PT "6 Clicks" Mobility   Outcome Measure  Help needed turning from your back to your side while in a flat bed without using bedrails?: A Lot Help needed moving from lying on your back to sitting on the side of a flat bed without using bedrails?: A Lot Help needed moving to and from a bed to a chair (including a wheelchair)?: A Lot Help needed standing up from a chair using your arms (e.g., wheelchair or bedside chair)?: A Lot Help needed to walk in hospital room?: A Lot Help needed climbing 3-5 steps with a railing? : Total 6 Click Score: 11    End of Session Equipment Utilized During Treatment: Gait belt Activity Tolerance: Patient limited by fatigue Patient left: with call bell/phone within reach;with family/visitor present;in bed;with bed alarm set Nurse Communication: Mobility status PT Visit Diagnosis: Other abnormalities of gait and mobility (R26.89);Muscle weakness (generalized) (M62.81);Other symptoms and signs involving the nervous system (R29.898)     Time: [id number] PT Time Calculation (min) (ACUTE ONLY): 21 min  Charges:  $Therapeutic Activity: 8-22  mins                     Elayne Snare, PT    Ellouise Newer 05/31/2019, 4:54 PM

## 2019-05-31 NOTE — Progress Notes (Signed)
Occupational Therapy Treatment Patient Details Name: William Mann MRN: [id number] DOB: Jan 22, 1930 Today's Date: 05/31/2019    History of present illness William Mann is a 86 y.o. male with history of  R ICA stenosis s/p R CEA, PVD, hypertension, hyperlipidemia, lower extremity DVT on Eliquis, carotid artery occlusion, atherosclerosis and bilateral hearing loss presenting with dysarthria, facial droop, R leg weakness.  Found to have L BG/CR and R PLIC/thalamic infarcts.   OT comments  Pt continues to progress toward established OT goals. Pt received in bed, asleep, with wife and daughter present. Family reports pt has been sleeping more this date. They report he has been attempting to talk more this date as well. Pt required modA for bed mobility. He tolerated sitting EOB for about 25 min while engaging in ADL. He required multimodal cues for upright posture. Pt will continue to benefit from skilled OT services to maximize safety and independence with ADL/IADL and functional mobility. Will continue to follow acutely and progress as tolerated.    Follow Up Recommendations  CIR    Equipment Recommendations  3 in 1 bedside commode    Recommendations for Other Services Rehab consult    Precautions / Restrictions Precautions Precautions: Fall Restrictions Weight Bearing Restrictions: No       Mobility Bed Mobility Overal bed mobility: Needs Assistance Bed Mobility: Supine to Sit;Sit to Supine     Supine to sit: Mod assist Sit to supine: Mod assist   General bed mobility comments: modA to progress BLE off bed and progress trunk to upright posture;assist for RLE and trunk with return to bed  Transfers                 General transfer comment: deferred for pt and therapist safety    Balance Overall balance assessment: Needs assistance Sitting-balance support: Single extremity supported;Feet unsupported;Feet supported Sitting balance-Leahy Scale:  Poor Sitting balance - Comments: drifting left frequently, required vc to correct posture;pt with bilat. feet floating off ground intermittently Postural control: Left lateral lean                                 ADL either performed or assessed with clinical judgement   ADL Overall ADL's : Needs assistance/impaired Eating/Feeding: NPO   Grooming: Minimal assistance;Sitting;Brushing hair Grooming Details (indicate cue type and reason): brushed hair while sitting EOB Upper Body Bathing: Moderate assistance;Sitting   Lower Body Bathing: Moderate assistance Lower Body Bathing Details (indicate cue type and reason): modA to apply lotion to BLE, cues for sequencing                       General ADL Comments: pt with decreased activity tolerance, tolerated sitting EOB for about 25 minutes with multimodal cues for postural control, engaged in ADL while seated EOB     Vision   Additional Comments: attending more to wife positioned on right   Perception     Praxis      Cognition Arousal/Alertness: Awake/alert Behavior During Therapy: Flat affect Overall Cognitive Status: Impaired/Different from baseline Area of Impairment: Orientation;Attention;Safety/judgement;Following commands                 Orientation Level: Disoriented to;Place;Time;Situation Current Attention Level: Focused   Following Commands: Follows one step commands with increased time Safety/Judgement: Decreased awareness of deficits     General Comments: difficult to understand at times. pt recognized wife with 1  vc; multimodal cues for sequencing tasks (i.e. open container, squeeze, apply lotion)        Exercises     Shoulder Instructions       General Comments pt's wife and daughter present throughout session    Pertinent Vitals/ Pain       Pain Assessment: Faces Faces Pain Scale: Hurts even more Pain Location: back Pain Descriptors / Indicators: Discomfort Pain  Intervention(s): Limited activity within patient's tolerance;Monitored during session  Home Living                                          Prior Functioning/Environment              Frequency  Min 2X/week        Progress Toward Goals  OT Goals(current goals can now be found in the care plan section)  Progress towards OT goals: Progressing toward goals  Acute Rehab OT Goals Patient Stated Goal: to go to rehab OT Goal Formulation: With patient/family Time For Goal Achievement: 06/13/19 Potential to Achieve Goals: Good ADL Goals Pt Will Perform Grooming: with min guard assist;sitting Pt Will Perform Upper Body Bathing: with min guard assist;sitting Pt Will Transfer to Toilet: with min assist;bedside commode;stand pivot transfer Additional ADL Goal #1: pt will complete bed mobility min (A) as precursor to adls.  Plan Discharge plan remains appropriate    Co-evaluation                 AM-PAC OT "6 Clicks" Daily Activity     Outcome Measure   Help from another person eating meals?: Total(NPO) Help from another person taking care of personal grooming?: A Lot Help from another person toileting, which includes using toliet, bedpan, or urinal?: A Lot Help from another person bathing (including washing, rinsing, drying)?: A Lot Help from another person to put on and taking off regular upper body clothing?: A Lot Help from another person to put on and taking off regular lower body clothing?: A Lot 6 Click Score: 11    End of Session    OT Visit Diagnosis: Unsteadiness on feet (R26.81);Muscle weakness (generalized) (M62.81)   Activity Tolerance Patient tolerated treatment well   Patient Left with family/visitor present;in bed;with call bell/phone within reach;with bed alarm set;with restraints reapplied   Nurse Communication Mobility status        Time: [id number] OT Time Calculation (min): 43 min  Charges: OT General Charges $OT Visit: 1  Visit OT Treatments $Self Care/Home Management : 38-52 mins  Helene Kelp OTR/L Acute Rehabilitation Services Office: [place] 05/31/2019, 1:15 PM

## 2019-06-01 LAB — BASIC METABOLIC PANEL
Anion gap: 8 (ref 5–15)
BUN: 24 mg/dL — ABNORMAL HIGH (ref 8–23)
CO2: 27 mmol/L (ref 22–32)
Calcium: 8.6 mg/dL — ABNORMAL LOW (ref 8.9–10.3)
Chloride: 105 mmol/L (ref 98–111)
Creatinine, Ser: 0.7 mg/dL (ref 0.61–1.24)
GFR calc Af Amer: >60 mL/min (ref >60)
GFR calc non Af Amer: >60 mL/min (ref >60)
Glucose, Bld: 104 mg/dL — ABNORMAL HIGH (ref 70–99)
Potassium: 3.2 mmol/L — ABNORMAL LOW (ref 3.5–5.1)
Sodium: 140 mmol/L (ref 135–145)

## 2019-06-01 LAB — GLUCOSE, CAPILLARY
Glucose-Capillary: 117 mg/dL — ABNORMAL HIGH (ref 70–99)
Glucose-Capillary: 121 mg/dL — ABNORMAL HIGH (ref 70–99)
Glucose-Capillary: 123 mg/dL — ABNORMAL HIGH (ref 70–99)
Glucose-Capillary: 126 mg/dL — ABNORMAL HIGH (ref 70–99)
Glucose-Capillary: 127 mg/dL — ABNORMAL HIGH (ref 70–99)
Glucose-Capillary: 157 mg/dL — ABNORMAL HIGH (ref 70–99)

## 2019-06-01 LAB — CBC
HCT: 41.9 % (ref 39.0–52.0)
Hemoglobin: 14.2 g/dL (ref 13.0–17.0)
MCH: 34.1 pg — ABNORMAL HIGH (ref 26.0–34.0)
MCHC: 33.9 g/dL (ref 30.0–36.0)
MCV: 100.5 fL — ABNORMAL HIGH (ref 80.0–100.0)
Platelets: 174 10*3/uL (ref 150–400)
RBC: 4.17 MIL/uL — ABNORMAL LOW (ref 4.22–5.81)
RDW: 13.3 % (ref 11.5–15.5)
WBC: 11.8 10*3/uL — ABNORMAL HIGH (ref 4.0–10.5)
nRBC: 0 % (ref 0.0–0.2)

## 2019-06-01 LAB — PHOSPHORUS: Phosphorus: 2.7 mg/dL (ref 2.5–4.6)

## 2019-06-01 LAB — MAGNESIUM: Magnesium: 2.1 mg/dL (ref 1.7–2.4)

## 2019-06-01 MED ORDER — POTASSIUM CHLORIDE 20 MEQ PO PACK: 40.0000 meq | PACK | ORAL | Status: DC

## 2019-06-01 MED ORDER — POTASSIUM CHLORIDE 20 MEQ PO PACK
40.0000 meq | PACK | ORAL | Status: AC
  Administered 2019-06-01 (×2): 40 meq
  Filled 2019-06-01 (×2): qty 2

## 2019-06-01 NOTE — Progress Notes (Signed)
[facility] Progress Note  Patient: William Mann    [id number]  DOA: 05/28/2019     Date of Service: the patient was seen and examined on 06/01/2019  Chief complaint. Slurred speech.  Brief hospital course: Past medical history of PVD, HTN, HLD, DVT on Eliquis, PPM implant, carotid stenosis SP right carotid endarterectomy, COVID-25 February 2019.  Presents with slurred speech and facial droop as well as recurrent fall.  MRI brain positive for small acute infarct in left basal ganglia and corona radiata as well as right thalamus.  Patient is on therapeutic anticoagulation with Eliquis.  Neurology consulted.  Currently further plan is monitor for improvement in patient's condition and continue to engage regarding goals of care conversation.  Assessment and Plan: 1.  Bilateral CVA Dysarthria Neurology consulted. Out of the window.  For TPA. Already on anticoagulation prior to admission. 2D echo shows preserved EF no valvular normality. 81 mg aspirin and Eliquis p.o. after cortrack insertion Neurology suspecting supraclinoid ICA stenosis as a potential etiology for which we will avoid lower BP.  Long-term blood pressure goal [id number] systolic.  2.  Recurrent fall Secondary to stroke. PT OT consulted. Recommending CIR. No acute fractures.  3.  History of DVT June 2020 Initially on IV heparin. Transition to Eliquis with tube  4.  Symptomatic bradycardia SP PPM implant Recurrent PVC NSVT Patient was treated with Lopressor. Monitor for hypotension in the setting of acute stroke.  5.  BPH Flomax ordered but currently holding due to n.p.o. status. If the patient has retention issues we will continue in and out catheterization and consider transitioning to Cardura although trying to avoid it to avoid low blood pressures.  6.  HLD Simvastatin ordered 40 mg.   7.  LVH Echocardiogram shows 55% EF no significant valvular abnormality.  8.  Macrocytosis H&H  relatively stable.  Monitor.  9.  Metabolic acidosis. Likely from p.o. intake. Lactic acid normal. ABG shows no hypercarbia. No acidosis as well on ABG. Currently resolved.  Monitor.  10.  Dysphagia. Currently core track tube for feeding and medication.  Will monitor improvement.  11.  Insomnia. Patient was unable to sleep last night. We will add Ambien and monitor.  Diet: NPO.  On tube feeding. DVT Prophylaxis: Therapeutic Anticoagulation with Eliquis   Advance goals of care discussion: Full code  Family Communication: family was present at bedside, at the time of interview.  The pt provided permission to discuss medical plan with the family. Opportunity was given to ask question and all questions were answered satisfactorily.   Disposition:  Status is: Inpatient  Remains inpatient appropriate because:Inpatient level of care appropriate due to severity of illness   Dispo: The patient is from: Home              Anticipated d/c is to: CIR              Anticipated d/c date is: 2 days              Patient currently is not medically stable to d/c.   Subjective: Patient was more dysarthria.  No nausea no vomiting but no fever no chills.  Physical Exam: General:  lethargic oriented to time and place.  Appear in mild distress, affect flat in affect Eyes: PERRL ENT: Oral Mucosa Clear, dry  Neck: difficult to assess  JVD,  Cardiovascular: S1 and S2 Present, no Murmur,  Respiratory: increased respiratory effort, Bilateral Air entry equal and Decreased, no Crackles, no wheezes Abdomen: Bowel  Sound present, Soft and no tenderness,  Skin: no rash Extremities: no Pedal edema, no calf tenderness Neurologic: without any new focal findings although exam is limited due to patient's cooperation. Gait not checked due to patient safety concerns  Vitals:   06/01/19 0435 06/01/19 0747 06/01/19 1147 06/01/19 1557  BP: (!) 132/101 110/63 (!) 148/68 138/76  Pulse: 82 89 82 72  Resp:  20 20 20 17   Temp: 98.4 F (36.9 C) 98.7 F (37.1 C) 98 F (36.7 C) 98.1 F (36.7 C)  TempSrc: Oral Oral Oral Oral  SpO2: 96% 96% 97% 96%    Intake/Output Summary (Last 24 hours) at 06/01/2019 1734 Last data filed at 06/01/2019 [id number] Gross per 24 hour  Intake --  Output 700 ml  Net -700 ml   There were no vitals filed for this visit.  Data Reviewed: I have personally reviewed and interpreted daily labs, tele strips, imagings as discussed above. I reviewed all nursing notes, pharmacy notes, vitals, pertinent old records I have discussed plan of care as described above with RN and patient/family.  CBC: Recent Labs  Lab 05/28/19 1154 05/28/19 1216 05/29/19 1147 05/29/19 1537 05/30/19 0707 05/31/19 0321 06/01/19 0919  WBC 9.1  --  14.3*  --  15.8* 13.3* 11.8*  NEUTROABS 7.3  --   --   --   --  10.8*  --   HGB 15.2   < > 15.5 15.3 16.1 15.5 14.2  HCT 45.9   < > 45.9 45.0 46.6 44.7 41.9  MCV 104.1*  --  101.5*  --  100.0 98.7 100.5*  PLT 174  --  180  --  199 180 174   < > = values in this interval not displayed.   Basic Metabolic Panel: Recent Labs  Lab 05/28/19 1154 05/28/19 1154 05/28/19 1216 05/28/19 1216 05/29/19 1147 05/29/19 1537 05/30/19 0707 05/31/19 0321 06/01/19 0919  NA 141   < > 142   < > 140 138 139 139 140  K 3.9   < > 3.8   < > 3.9 3.7 3.6 3.4* 3.2*  CL 111   < > 110  --  107  --  105 106 105  CO2 22  --   --   --  16*  --  20* 23 27  GLUCOSE 104*   < > 98  --  104*  --  115* 126* 104*  BUN 14   < > 15  --  14  --  16 22 24*  CREATININE 0.85   < > 0.70  --  0.75  --  0.79 0.63 0.70  CALCIUM 8.7*  --   --   --  8.5*  --  8.6* 8.6* 8.6*  MG  --   --   --   --  1.9  --   --   --  2.1  PHOS  --   --   --   --   --   --   --   --  2.7   < > = values in this interval not displayed.    Studies: No results found.  Scheduled Meds: . apixaban  5 mg Per Tube BID  . aspirin  81 mg Per Tube Daily  . feeding supplement (PRO-STAT SUGAR FREE 64)  30 mL  Per Tube Daily  . free water  110 mL Per Tube Q4H  . multivitamin  15 mL Per Tube Daily  . simvastatin  40 mg Per  Tube q1800  . sodium chloride  1 spray Each Nare QID  . tamsulosin  0.4 mg Oral Daily   Continuous Infusions: . feeding supplement (OSMOLITE 1.2 CAL) 1,000 mL (06/01/19 1153)   PRN Meds: acetaminophen **OR** acetaminophen (TYLENOL) oral liquid 160 mg/5 mL **OR** acetaminophen, zolpidem  Time spent: 35 minutes  Author: Berle Mull, MD [facility] 06/01/2019 5:34 PM  To reach On-call, see care teams to locate the attending and reach out to them via [email or web address]. If 7PM-7AM, please contact night-coverage If you still have difficulty reaching the attending provider, please page the [facility] (Director on Call) for [facility] on amion for assistance.

## 2019-06-02 ENCOUNTER — Inpatient Hospital Stay (HOSPITAL_COMMUNITY): Payer: Medicare Other

## 2019-06-02 LAB — GLUCOSE, CAPILLARY
Glucose-Capillary: 102 mg/dL — ABNORMAL HIGH (ref 70–99)
Glucose-Capillary: 117 mg/dL — ABNORMAL HIGH (ref 70–99)
Glucose-Capillary: 128 mg/dL — ABNORMAL HIGH (ref 70–99)
Glucose-Capillary: 129 mg/dL — ABNORMAL HIGH (ref 70–99)
Glucose-Capillary: 134 mg/dL — ABNORMAL HIGH (ref 70–99)
Glucose-Capillary: 135 mg/dL — ABNORMAL HIGH (ref 70–99)
Glucose-Capillary: 136 mg/dL — ABNORMAL HIGH (ref 70–99)

## 2019-06-02 LAB — COMPREHENSIVE METABOLIC PANEL
ALT: 170 U/L — ABNORMAL HIGH (ref 0–44)
AST: 149 U/L — ABNORMAL HIGH (ref 15–41)
Albumin: 2.7 g/dL — ABNORMAL LOW (ref 3.5–5.0)
Alkaline Phosphatase: 136 U/L — ABNORMAL HIGH (ref 38–126)
Anion gap: 10 (ref 5–15)
BUN: 25 mg/dL — ABNORMAL HIGH (ref 8–23)
CO2: 26 mmol/L (ref 22–32)
Calcium: 8.8 mg/dL — ABNORMAL LOW (ref 8.9–10.3)
Chloride: 102 mmol/L (ref 98–111)
Creatinine, Ser: 0.65 mg/dL (ref 0.61–1.24)
GFR calc Af Amer: >60 mL/min (ref >60)
GFR calc non Af Amer: >60 mL/min (ref >60)
Glucose, Bld: 134 mg/dL — ABNORMAL HIGH (ref 70–99)
Potassium: 3.8 mmol/L (ref 3.5–5.1)
Sodium: 138 mmol/L (ref 135–145)
Total Bilirubin: 1.7 mg/dL — ABNORMAL HIGH (ref 0.3–1.2)
Total Protein: 5.8 g/dL — ABNORMAL LOW (ref 6.5–8.1)

## 2019-06-02 LAB — BLOOD GAS, ARTERIAL
Acid-Base Excess: 3.1 mmol/L — ABNORMAL HIGH (ref 0.0–2.0)
Bicarbonate: 26.3 mmol/L (ref 20.0–28.0)
Drawn by: 275531
FIO2: 21
O2 Saturation: 94.7 %
Patient temperature: 37
pCO2 arterial: 34.7 mmHg (ref 32.0–48.0)
pH, Arterial: 7.492 — ABNORMAL HIGH (ref 7.350–7.450)
pO2, Arterial: 67.6 mmHg — ABNORMAL LOW (ref 83.0–108.0)

## 2019-06-02 LAB — CBC
HCT: 44.5 % (ref 39.0–52.0)
Hemoglobin: 15.5 g/dL (ref 13.0–17.0)
MCH: 34.8 pg — ABNORMAL HIGH (ref 26.0–34.0)
MCHC: 34.8 g/dL (ref 30.0–36.0)
MCV: 99.8 fL (ref 80.0–100.0)
Platelets: 198 10*3/uL (ref 150–400)
RBC: 4.46 MIL/uL (ref 4.22–5.81)
RDW: 13.2 % (ref 11.5–15.5)
WBC: 15.1 10*3/uL — ABNORMAL HIGH (ref 4.0–10.5)
nRBC: 0 % (ref 0.0–0.2)

## 2019-06-02 LAB — LACTIC ACID, PLASMA
Lactic Acid, Venous: 1.7 mmol/L (ref 0.5–1.9)
Lactic Acid, Venous: 2 mmol/L (ref 0.5–1.9)

## 2019-06-02 LAB — BASIC METABOLIC PANEL
Anion gap: 8 (ref 5–15)
BUN: 23 mg/dL (ref 8–23)
CO2: 25 mmol/L (ref 22–32)
Calcium: 8.7 mg/dL — ABNORMAL LOW (ref 8.9–10.3)
Chloride: 105 mmol/L (ref 98–111)
Creatinine, Ser: 0.65 mg/dL (ref 0.61–1.24)
GFR calc Af Amer: >60 mL/min (ref >60)
GFR calc non Af Amer: >60 mL/min (ref >60)
Glucose, Bld: 134 mg/dL — ABNORMAL HIGH (ref 70–99)
Potassium: 3.8 mmol/L (ref 3.5–5.1)
Sodium: 138 mmol/L (ref 135–145)

## 2019-06-02 LAB — PROCALCITONIN: Procalcitonin: 0.16 ng/mL

## 2019-06-02 LAB — CORTISOL: Cortisol, Plasma: 24.3 ug/dL

## 2019-06-02 MED ORDER — DOXAZOSIN MESYLATE 1 MG PO TABS
1.0000 mg | ORAL_TABLET | Freq: Every day | ORAL | Status: DC
  Administered 2019-06-02 – 2019-06-19 (×17): 1 mg
  Filled 2019-06-02 (×18): qty 1

## 2019-06-02 NOTE — Progress Notes (Signed)
Physical Therapy Treatment Patient Details Name: William Mann MRN: [id number] DOB: 1929/11/25 Today's Date: 06/02/2019    History of Present Illness [name] is a 86 y.o. male with history of  R ICA stenosis s/p R CEA, PVD, hypertension, hyperlipidemia, lower extremity DVT on Eliquis, carotid artery occlusion, atherosclerosis and bilateral hearing loss presenting with dysarthria, facial droop, R leg weakness.  Found to have L BG/CR and R PLIC/thalamic infarcts.    PT Comments    Pt in bed upon arrival of PT/OT, noted increase in lethargy/decreased arousal compared to previous notes, but pt agreeable to session with focus on progression of functional mobility. The pt was able to tolerate transition from supine in bed to sitting EOB, but required assist for movement of BLE and totalA to complete trunk elevation and stabilization due to lethargy. The pt was then asked to complete various seated reaching/self-care tasks while holding his balance but was unable to initiate reaching or maintain seated posture without assist. Further progression of mobility was deferred today due to significant lethargy and decreased command following, family asking questions and educated on prevalence of fatigue after stroke and strategies for maximizing both rest and alertness/engagement to facilitate recovery. Family understanding that pt current presentation is not appropriate for CIR-level rehab, but remain hopeful that the pt may progress to tolerating intensive therapies. Continue to recommend CIR-level rehab at this time, but may need to switch to SNF-level rehab if no change in pt arousal.    Follow Up Recommendations  CIR;Supervision/Assistance - 24 hour     Equipment Recommendations  None recommended by PT(defer to post acute)    Recommendations for Other Services       Precautions / Restrictions Precautions Precautions: Fall Restrictions Weight Bearing Restrictions: No     Mobility  Bed Mobility Overal bed mobility: Needs Assistance Bed Mobility: Supine to Sit;Sit to Supine     Supine to sit: Total assist;+2 for safety/equipment Sit to supine: Mod assist;+2 for safety/equipment   General bed mobility comments: pt with poor command following today likely due to lethargy, required assist to BLE to mobilize to EOB, complete assist to trunk to elevate to sitting EOB, then mod/maxA to maintain sitting position  Transfers Overall transfer level: Needs assistance(unsafe to trial today due to pt lethargy and decreased command following)                  Ambulation/Gait                 Stairs             Wheelchair Mobility    Modified Rankin (Stroke Patients Only) Modified Rankin (Stroke Patients Only) Pre-Morbid Rankin Score: Moderate disability Modified Rankin: Moderately severe disability     Balance Overall balance assessment: Needs assistance Sitting-balance support: Single extremity supported;Feet unsupported;Feet supported Sitting balance-Leahy Scale: Poor Sitting balance - Comments: drifting left frequently, required vc to correct posture;pt with bilat. feet floating off ground intermittently Postural control: Left lateral lean;Posterior lean   Standing balance-Leahy Scale: (unable to attempt today)                              Cognition Arousal/Alertness: Lethargic Behavior During Therapy: Flat affect Overall Cognitive Status: Impaired/Different from baseline Area of Impairment: Orientation;Attention;Safety/judgement;Following commands                 Orientation Level: Disoriented to;Place;Time;Situation Current Attention Level: Focused   Following  Commands: Follows one step commands inconsistently Safety/Judgement: Decreased awareness of deficits;Decreased awareness of safety     General Comments: difficult to understand at times. pt unable to verbalize how he knows his son, falling  asleep during session without constant verbal cues/interaction.      Exercises Other Exercises Other Exercises: seated reaching x 2 (pt with decreased command following, able to reach for close washcloth and wash face with hand-over-hand assist) Other Exercises: seated lateral leans (x1 each direction), modA through pelvis to faciliate return to sitting    General Comments General comments (skin integrity, edema, etc.): pt's wife and son present, encouraging, asking questions about fatigue and alertness after stroke. Discussed keeping CIR as plan, but possible need to change d/c recommendation if pt arousal does not improve.      Pertinent Vitals/Pain Pain Assessment: No/denies pain Pain Intervention(s): Limited activity within patient's tolerance;Monitored during session;Repositioned    Home Living                      Prior Function            PT Goals (current goals can now be found in the care plan section) Acute Rehab PT Goals Patient Stated Goal: to go to rehab PT Goal Formulation: With patient/family Time For Goal Achievement: 06/12/19 Potential to Achieve Goals: Fair Progress towards PT goals: Not progressing toward goals - comment(sig lethargy today, may need to change d/c rec from CIR to SNF if no change in arousa;)    Frequency    Min 4X/week      PT Plan Current plan remains appropriate    Co-evaluation PT/OT/SLP Co-Evaluation/Treatment: Yes Reason for Co-Treatment: Necessary to address cognition/behavior during functional activity;For patient/therapist safety;To address functional/ADL transfers PT goals addressed during session: Mobility/safety with mobility;Balance;Strengthening/ROM        AM-PAC PT "6 Clicks" Mobility   Outcome Measure  Help needed turning from your back to your side while in a flat bed without using bedrails?: A Lot Help needed moving from lying on your back to sitting on the side of a flat bed without using bedrails?:  Total Help needed moving to and from a bed to a chair (including a wheelchair)?: Total Help needed standing up from a chair using your arms (e.g., wheelchair or bedside chair)?: Total Help needed to walk in hospital room?: Total Help needed climbing 3-5 steps with a railing? : Total 6 Click Score: 7    End of Session   Activity Tolerance: Patient limited by fatigue;Patient limited by lethargy Patient left: with call bell/phone within reach;with family/visitor present;in bed;with bed alarm set Nurse Communication: Mobility status PT Visit Diagnosis: Other abnormalities of gait and mobility (R26.89);Muscle weakness (generalized) (M62.81);Other symptoms and signs involving the nervous system [id number])     Time: [id number] PT Time Calculation (min) (ACUTE ONLY): 35 min  Charges:  $Therapeutic Activity: 8-22 mins                     Karma Ganja, PT, DPT   Acute Rehabilitation Department Pager #: [phone]   Otho Bellows 06/02/2019, 2:50 PM

## 2019-06-02 NOTE — Progress Notes (Signed)
CRITICAL VALUE ALERT  Critical Value:  Lactic Acid 2.0  Date & Time Notied:  06/02/19  1730  Provider Notified: Dr. Posey Pronto   Orders Received/Actions taken: Paged. Awaiting a return call

## 2019-06-02 NOTE — Progress Notes (Signed)
Occupational Therapy Treatment Patient Details Name: William Mann MRN: [id number] DOB: Oct 30, 1929 Today's Date: 06/02/2019    History of present illness [name] is a 86 y.o. male with history of  R ICA stenosis s/p R CEA, PVD, hypertension, hyperlipidemia, lower extremity DVT on Eliquis, carotid artery occlusion, atherosclerosis and bilateral hearing loss presenting with dysarthria, facial droop, R leg weakness.  Found to have L BG/CR and R PLIC/thalamic infarcts.   OT comments  Pt very lethargic today and with difficulty remaining awake (requiring consistent stimulation/cues to do so) during this session. Pt tolerating activity seated EOB, requiring two person assist for completion of bed mobility and fluctuating levels of assist for sitting balance EOB (mod-maxA due to L lateral/posterior lean). Pt engaging in simple grooming ADL given distal support at L elbow. Additional focus on activities to activate trunk including reaching activities, trunk rotation and lateral leans/wt bearing through bil elbows. SpO2 95% on RA and HR in the 100s during session. Continue to recommend CIR level therapies at time of discharge, however pending pt progress/level of arousal may have to explore other rehab options. Will continue to follow acutely.   Follow Up Recommendations  CIR;Supervision/Assistance - 24 hour(pending progress)    Equipment Recommendations  3 in 1 bedside commode          Precautions / Restrictions Precautions Precautions: Fall Restrictions Weight Bearing Restrictions: No       Mobility Bed Mobility Overal bed mobility: Needs Assistance Bed Mobility: Supine to Sit;Sit to Supine     Supine to sit: Total assist;+2 for safety/equipment Sit to supine: Total assist;+2 for physical assistance   General bed mobility comments: pt with poor command following today likely due to lethargy, required assist to BLE to mobilize to EOB, complete assist to trunk to  elevate to sitting EOB, then mod/maxA to maintain sitting position  Transfers Overall transfer level: Needs assistance(unsafe to trial today due to pt lethargy and decreased command following)               General transfer comment: deferred due to lethargy    Balance Overall balance assessment: Needs assistance Sitting-balance support: Single extremity supported;Feet unsupported;Feet supported Sitting balance-Leahy Scale: Poor Sitting balance - Comments: drifting left frequently, required verbal/tactile cues to correct posture; Postural control: Left lateral lean;Posterior lean   Standing balance-Leahy Scale: (unable to attempt today)                             ADL either performed or assessed with clinical judgement   ADL Overall ADL's : Needs assistance/impaired     Grooming: Moderate assistance;Sitting Grooming Details (indicate cue type and reason): assist to support LUE to bring hand to face                             Functional mobility during ADLs: Total assistance;+2 for physical assistance(bed mobility) General ADL Comments: pt with increased lethargy today, tolerating sitting EOB but requiring external assist to maintain upright and having significant difficulty remaining awake without constant stimulation                        Cognition Arousal/Alertness: Lethargic Behavior During Therapy: Flat affect Overall Cognitive Status: Impaired/Different from baseline Area of Impairment: Orientation;Attention;Safety/judgement;Following commands                 Orientation Level: Disoriented  to;Place;Time;Situation Current Attention Level: Focused   Following Commands: Follows one step commands inconsistently Safety/Judgement: Decreased awareness of deficits;Decreased awareness of safety     General Comments: difficult to understand at times. pt unable to verbalize how he knows his son, falling asleep during session without  constant verbal cues/interaction.        Exercises Exercises: Other exercises Other Exercises Other Exercises: seated reaching x 2 (pt with decreased command following, able to reach for close washcloth and wash face with hand-over-hand assist) Other Exercises: seated lateral leans (x1 each direction), modA through pelvis to faciliate return to sitting Other Exercises: working on faciliating trunk activation and trunk rotation towards L and R via HHA   Shoulder Instructions       General Comments pt's wife and son present, encouraging, asking questions about fatigue and alertness after stroke. Discussed keeping CIR as plan, but possible need to change d/c recommendation if pt arousal does not improve. SpO2 checked given lethargy and 95% on RA, HR in the 100s    Pertinent Vitals/ Pain       Pain Assessment: No/denies pain Pain Intervention(s): Limited activity within patient's tolerance;Monitored during session;Repositioned  Home Living                                          Prior Functioning/Environment              Frequency  Min 2X/week        Progress Toward Goals  OT Goals(current goals can now be found in the care plan section)  Progress towards OT goals: OT to reassess next treatment  Acute Rehab OT Goals Patient Stated Goal: to go to rehab OT Goal Formulation: With patient/family Time For Goal Achievement: 06/13/19 Potential to Achieve Goals: Good ADL Goals Pt Will Perform Grooming: with min guard assist;sitting Pt Will Perform Upper Body Bathing: with min guard assist;sitting Pt Will Transfer to Toilet: with min assist;bedside commode;stand pivot transfer Additional ADL Goal #1: pt will complete bed mobility min (A) as precursor to adls.  Plan Discharge plan remains appropriate(will continue to monitor)    Co-evaluation    PT/OT/SLP Co-Evaluation/Treatment: Yes Reason for Co-Treatment: Complexity of the patient's impairments  (multi-system involvement);Necessary to address cognition/behavior during functional activity;To address functional/ADL transfers;For patient/therapist safety PT goals addressed during session: Mobility/safety with mobility;Balance;Strengthening/ROM OT goals addressed during session: ADL's and self-care;Strengthening/ROM      AM-PAC OT "6 Clicks" Daily Activity     Outcome Measure   Help from another person eating meals?: A Lot Help from another person taking care of personal grooming?: A Lot Help from another person toileting, which includes using toliet, bedpan, or urinal?: Total Help from another person bathing (including washing, rinsing, drying)?: A Lot Help from another person to put on and taking off regular upper body clothing?: A Lot Help from another person to put on and taking off regular lower body clothing?: Total 6 Click Score: 10    End of Session    OT Visit Diagnosis: Unsteadiness on feet (R26.81);Muscle weakness (generalized) (M62.81);Other symptoms and signs involving cognitive function   Activity Tolerance Patient limited by lethargy   Patient Left in bed;with call bell/phone within reach;with bed alarm set;with family/visitor present   Nurse Communication Mobility status        Time: [id number] OT Time Calculation (min): 36 min  Charges: OT General Charges $OT Visit: 1  Visit OT Treatments $Self Care/Home Management : 8-22 mins  Lou Cal, OT Acute Rehabilitation Services Pager [phone] Office [phone]    Raymondo Band 06/02/2019, 4:40 PM

## 2019-06-02 NOTE — Progress Notes (Signed)
  Speech Language Pathology Treatment: Dysphagia  Patient Details Name: Earlee Age MRN: [id number] DOB: 1929-05-23 Today's Date: 06/02/2019 Time: [id number] SLP Time Calculation (min) (ACUTE ONLY): 16 min  Assessment / Plan / Recommendation Clinical Impression  Pt with increased lethargy today and apparently did not sleep much last night. Treatment focused on education re MBS on 4/23 with son and wife, answered questions. Pam, PA-C from rehab present for some of session. Reviewed results, recommendations and goals for swallow intervention. SLP raised pt's HOB and he was able to remain awake during oral care that PA provided and later SLP to remove remaining residual. No po's trialed at present. Continue to provide oral care to decrease bacterial load. SLP will continue therapy focusing on swallow, dysarthria and cognitive-linguistic abilities (evaluated 4/23)   HPI HPI: Cobain Krikorian is a 86 y.o. male with medical history significant of thyroid nodule, emphysema, GERD, right carotid endarterectomy,PVD, hypertension, hyperlipidemia, lower extremity DVT, bilateral hearing loss, presented with new onset of slurred speech and facial droop. Per chart wife also noticed significant right leg weakness, right-sided facial droop, and cough/choking after eating drink since last night. MRI shows small acute infarcts in the left basal ganglia/corona radiata and right thalamus. CXR 4/21 No acute cardiopulmonary disease      SLP Plan  Continue with current plan of care       Recommendations  Diet recommendations: NPO Medication Administration: Via alternative means                Oral Care Recommendations: Oral care QID Follow up Recommendations: Inpatient Rehab;Skilled Nursing facility SLP Visit Diagnosis: Dysphagia, oropharyngeal phase (R13.12) Plan: Continue with current plan of care                       Houston Siren 06/02/2019, 1:27 PM  Orbie Pyo Colvin Caroli.Ed Risk analyst [phone] Office [phone]

## 2019-06-02 NOTE — Progress Notes (Signed)
Inpatient Rehab Admissions Coordinator Note:   Pt. With change in mental status. Not appropriate for CIR admit today. Will check tomorrow for appropriateness and admit if bed is available and Pt. Is more alert and able to participate in therapy.   Clemens Catholic, [place], [place] Admissions Coordinator  [phone] ([place]) [phone] (office)

## 2019-06-02 NOTE — Progress Notes (Addendum)
[facility] Progress Note  Patient: William Mann    [id number]  DOA: 05/28/2019     Date of Service: the patient was seen and examined on 06/02/2019  Chief complaint. Slurred speech.  Brief hospital course: Past medical history of PVD, HTN, HLD, DVT on Eliquis, PPM implant, carotid stenosis SP right carotid endarterectomy, COVID-25 February 2019.  Presents with slurred speech and facial droop as well as recurrent fall.  MRI brain positive for small acute infarct in left basal ganglia and corona radiata as well as right thalamus.  Patient is on therapeutic anticoagulation with Eliquis.  Neurology consulted.  Currently further plan is monitor for improvement in patient's condition and continue to engage regarding goals of care conversation.  Assessment and Plan: 1.  Bilateral CVA Dysarthria Recurrent CVA. Neurology consulted. Out of the window.  For TPA. Already on anticoagulation prior to admission. 2D echo shows preserved EF no valvular normality. 81 mg aspirin and Eliquis p.o. after cortrack insertion Neurology suspecting supraclinoid ICA stenosis as a potential etiology for which we will avoid lower BP.  Long-term blood pressure goal [id number] systolic. 06/02/2019.  Patient was awake in the morning.  Confused and lethargic in the afternoon.  Again awake in the evening.  CT head shows periventricular infarct which appears to be new.  Discussed with neurology and recommend continue current care, they will inform stroke service.  2.  Recurrent fall Secondary to stroke. PT OT consulted. Recommending CIR. No acute fractures.  3.  History of DVT June 2020 Initially on IV heparin. Transition to Eliquis with tube  4.  Symptomatic bradycardia SP PPM implant Recurrent PVC NSVT Patient was treated with Lopressor. Monitor for hypotension in the setting of acute stroke.  5.  BPH Flomax ordered but currently holding due to n.p.o. status. If the patient has retention  issues we will continue in and out catheterization and consider transitioning to Cardura although trying to avoid it to avoid low blood pressures.  6.  HLD Simvastatin ordered 40 mg.   7.  LVH Echocardiogram shows 55% EF no significant valvular abnormality.  8.  Macrocytosis H&H relatively stable.  Monitor.  9.  Metabolic acidosis. Likely from p.o. intake. Lactic acid normal. ABG shows no hypercarbia. No acidosis as well on ABG. Currently resolved.  Monitor.  10.  Dysphagia. Currently core track tube for feeding and medication.  Will monitor improvement.  11.  Insomnia. Patient was unable to sleep last night. We will add Ambien and monitor.  12.  Acute metabolic encephalopathy. Patient has waxing and waning mentation. Likely metabolic in nature. We will check EEG to rule out seizures. ABG unremarkable. Other labs are unremarkable. Continue to monitor closely.  13.  Goals of care discussion. Engaging with family on a daily basis regarding goals of care. Patient's prognosis is poor given his acute and recurrent stroke as well as comorbidities of PVD, HTN and DVT as well as pacemaker implant. Patient is progressively deconditioned and with his waxing and waning mental status unable to participate with therapy for now. Also unable to swallow safely therefore at risk for recurrent aspiration. Discussed with patient on 06/01/2019 would like resuscitation. I have explained to family that patient's outcome of surviving a cardiac arrest is likely very low.   Diet: NPO.  On tube feeding. DVT Prophylaxis: Therapeutic Anticoagulation with Eliquis   Advance goals of care discussion: Full code  Family Communication: family was present at bedside, at the time of interview.  The pt provided permission to discuss  medical plan with the family. Opportunity was given to ask question and all questions were answered satisfactorily.   Disposition:  Status is: Inpatient  Remains  inpatient appropriate because:Inpatient level of care appropriate due to severity of illness   Dispo: The patient is from: Home              Anticipated d/c is to: [facility] patient has a bed available at [facility] but currently monitoring closely.              Anticipated d/c date is: 2 days              Patient currently is not medically stable to d/c.   Subjective: Awake in the morning.  Confused in the afternoon.  Unable to wake up.  Not following commands.  Answering questions appropriately early in the morning.  Later in the evening back to normal.  Physical Exam: General: Appear in moderate distress, no Rash; Oral Mucosa Clear, dry. no Abnormal Neck Mass Or lumps, Conjunctiva normal  Cardiovascular: S1 and S2 Present, no Murmur, Respiratory: no respiratory effort, Bilateral Air entry present and Clear to Auscultation, no Crackles, no wheezes Abdomen: Bowel Sound present, Soft and difficult to assess  tenderness, no hernia Extremities: no Pedal edema, no calf tenderness Neurology: lethargic and not oriented to time, place, and person affect unresponsive.  Vitals:   06/02/19 0805 06/02/19 1240 06/02/19 1438 06/02/19 1513  BP: (!) 161/71 (!) 176/79  (!) 166/81  Pulse: 84 86 (!) 110 82  Resp: 16 17  (!) 21  Temp: 97.8 F (36.6 C) 98.1 F (36.7 C)  98 F (36.7 C)  TempSrc: Oral Oral  Oral  SpO2: 96% 98% 95% 96%    Intake/Output Summary (Last 24 hours) at 06/02/2019 1913 Last data filed at 06/02/2019 0200 Gross per 24 hour  Intake --  Output 400 ml  Net -400 ml   There were no vitals filed for this visit.  Data Reviewed: I have personally reviewed and interpreted daily labs, tele strips, imagings as discussed above. I reviewed all nursing notes, pharmacy notes, vitals, pertinent old records I have discussed plan of care as described above with RN and patient/family.  CBC: Recent Labs  Lab 05/28/19 1154 05/28/19 1216 05/29/19 1147 05/29/19 1147 05/29/19 1537 05/30/19 0707  05/31/19 0321 06/01/19 0919 06/02/19 0351  WBC 9.1   < > 14.3*  --   --  15.8* 13.3* 11.8* 15.1*  NEUTROABS 7.3  --   --   --   --   --  10.8*  --   --   HGB 15.2   < > 15.5   < > 15.3 16.1 15.5 14.2 15.5  HCT 45.9   < > 45.9   < > 45.0 46.6 44.7 41.9 44.5  MCV 104.1*   < > 101.5*  --   --  100.0 98.7 100.5* 99.8  PLT 174   < > 180  --   --  199 180 174 198   < > = values in this interval not displayed.   Basic Metabolic Panel: Recent Labs  Lab 05/29/19 1147 05/29/19 1537 05/30/19 0707 05/31/19 0321 06/01/19 0919 06/02/19 0351 06/02/19 1530  NA 140   < > 139 139 140 138 138  K 3.9   < > 3.6 3.4* 3.2* 3.8 3.8  CL 107   < > 105 106 105 105 102  CO2 16*   < > 20* 23 27 25 26   GLUCOSE 104*   < >  115* 126* 104* 134* 134*  BUN 14   < > 16 22 24* 23 25*  CREATININE 0.75   < > 0.79 0.63 0.70 0.65 0.65  CALCIUM 8.5*   < > 8.6* 8.6* 8.6* 8.7* 8.8*  MG 1.9  --   --   --  2.1  --   --   PHOS  --   --   --   --  2.7  --   --    < > = values in this interval not displayed.    Studies: CT HEAD WO CONTRAST  Result Date: 06/02/2019 CLINICAL DATA:  Altered mental status. Possible cephalopathy. EXAM: CT HEAD WITHOUT CONTRAST TECHNIQUE: Contiguous axial images were obtained from the base of the skull through the vertex without intravenous contrast. COMPARISON:  May 28, 2019. FINDINGS: Brain: Mild diffuse cortical atrophy is noted. Mild chronic ischemic white matter disease is noted. Ventricular size is within normal limits. New low density is noted in left periventricular white matter measuring 2.0 x 1.3 cm concerning for acute infarction. Old lacunar infarction is noted more anteriorly which is unchanged compared to prior exam. No hemorrhage or mass lesion is noted. Vascular: No hyperdense vessel or unexpected calcification. Skull: Normal. Negative for fracture or focal lesion. Sinuses/Orbits: No acute finding. Other: None. IMPRESSION: New low density is noted in left periventricular white  matter concerning for acute infarction. MRI may be performed for further evaluation. Electronically Signed   By: Marijo Conception M.D.   On: 06/02/2019 16:17   DG CHEST PORT 1 VIEW  Result Date: 06/02/2019 CLINICAL DATA:  Shortness of breath EXAM: PORTABLE CHEST 1 VIEW COMPARISON:  05/29/2019 FINDINGS: Single frontal view of the chest demonstrates enteric catheter passing below diaphragm tip overlying gastric antrum. Dual lead pacemaker overlying left chest unchanged. Cardiac silhouette is enlarged but stable. Mild atherosclerosis aortic arch unchanged. No airspace disease, effusion, or pneumothorax. Stable scarring at the left base. No acute bony abnormalities. IMPRESSION: 1. Enteric catheter as above. 2. No acute intrathoracic process. Electronically Signed   By: Randa Ngo M.D.   On: 06/02/2019 15:41    Scheduled Meds: . apixaban  5 mg Per Tube BID  . aspirin  81 mg Per Tube Daily  . doxazosin  1 mg Per Tube Daily  . feeding supplement (PRO-STAT SUGAR FREE 64)  30 mL Per Tube Daily  . free water  110 mL Per Tube Q4H  . multivitamin  15 mL Per Tube Daily  . simvastatin  40 mg Per Tube q1800  . sodium chloride  1 spray Each Nare QID   Continuous Infusions: . feeding supplement (OSMOLITE 1.2 CAL) 1,000 mL (06/01/19 1153)   PRN Meds: acetaminophen **OR** acetaminophen (TYLENOL) oral liquid 160 mg/5 mL **OR** acetaminophen, zolpidem  Time spent: 35 minutes  Author: Berle Mull, MD [facility] 06/02/2019 7:13 PM  To reach On-call, see care teams to locate the attending and reach out to them via [email or web address]. If 7PM-7AM, please contact night-coverage If you still have difficulty reaching the attending provider, please page the [facility] (Director on Call) for [facility] on amion for assistance.

## 2019-06-02 NOTE — Progress Notes (Signed)
EEG complete - results pending 

## 2019-06-03 ENCOUNTER — Inpatient Hospital Stay (HOSPITAL_COMMUNITY): Payer: Medicare Other

## 2019-06-03 DIAGNOSIS — R4182 Altered mental status, unspecified: Secondary | ICD-10-CM

## 2019-06-03 LAB — BASIC METABOLIC PANEL
Anion gap: 7 (ref 5–15)
BUN: 30 mg/dL — ABNORMAL HIGH (ref 8–23)
CO2: 26 mmol/L (ref 22–32)
Calcium: 8.6 mg/dL — ABNORMAL LOW (ref 8.9–10.3)
Chloride: 101 mmol/L (ref 98–111)
Creatinine, Ser: 0.82 mg/dL (ref 0.61–1.24)
GFR calc Af Amer: >60 mL/min (ref >60)
GFR calc non Af Amer: >60 mL/min (ref >60)
Glucose, Bld: 125 mg/dL — ABNORMAL HIGH (ref 70–99)
Potassium: 4.1 mmol/L (ref 3.5–5.1)
Sodium: 134 mmol/L — ABNORMAL LOW (ref 135–145)

## 2019-06-03 LAB — URINALYSIS, COMPLETE (UACMP) WITH MICROSCOPIC
Bilirubin Urine: NEGATIVE
Glucose, UA: NEGATIVE mg/dL
Hgb urine dipstick: NEGATIVE
Ketones, ur: NEGATIVE mg/dL
Leukocytes,Ua: NEGATIVE
Nitrite: NEGATIVE
Protein, ur: NEGATIVE mg/dL
Specific Gravity, Urine: 1.021 (ref 1.005–1.030)
pH: 5 (ref 5.0–8.0)

## 2019-06-03 LAB — GLUCOSE, CAPILLARY
Glucose-Capillary: 143 mg/dL — ABNORMAL HIGH (ref 70–99)
Glucose-Capillary: 118 mg/dL — ABNORMAL HIGH (ref 70–99)
Glucose-Capillary: 149 mg/dL — ABNORMAL HIGH (ref 70–99)
Glucose-Capillary: 94 mg/dL (ref 70–99)
Glucose-Capillary: 136 mg/dL — ABNORMAL HIGH (ref 70–99)

## 2019-06-03 LAB — CBC
HCT: 41.8 % (ref 39.0–52.0)
Hemoglobin: 13.8 g/dL (ref 13.0–17.0)
MCH: 33.5 pg (ref 26.0–34.0)
MCHC: 33 g/dL (ref 30.0–36.0)
MCV: 101.5 fL — ABNORMAL HIGH (ref 80.0–100.0)
Platelets: 181 10*3/uL (ref 150–400)
RBC: 4.12 MIL/uL — ABNORMAL LOW (ref 4.22–5.81)
RDW: 13.5 % (ref 11.5–15.5)
WBC: 14.5 10*3/uL — ABNORMAL HIGH (ref 4.0–10.5)
nRBC: 0 % (ref 0.0–0.2)

## 2019-06-03 NOTE — Procedures (Signed)
Patient Name: William Mann  MRN: [id number]  Epilepsy Attending: Lora Havens  Referring Physician/Provider: Dr. Berle Mull  Date: 06/02/2019 Duration: 24.58 minutes  Patient history: 86 year old male with acute stroke and continued waxing and waning mentation.  EEG to evaluate for seizures.  Level of alertness: Awake, asleep  AEDs during EEG study: None  Technical aspects: This EEG study was done with scalp electrodes positioned according to the 10-20 International system of electrode placement. Electrical activity was acquired at a sampling rate of 500Hz  and reviewed with a high frequency filter of 70Hz  and a low frequency filter of 1Hz . EEG data were recorded continuously and digitally stored.   Description: During awake state, no clear posterior dominant was seen.  Sleep was characterized by sleep spindles (12 to 14 Hz), maximal frontocentral region.  EEG showed continuous generalized polymorphic 3 to 6 Hz theta-delta slowing. Hyperventilation and photic stimulation were not performed.  Abnormality -Continuous slow, generalized  IMPRESSION: This study is suggestive of moderate diffuse encephalopathy, nonspecific etiology. No seizures or epileptiform discharges were seen throughout the recording.  Armanie Ullmer Barbra Sarks

## 2019-06-03 NOTE — Progress Notes (Signed)
SLP Cancellation Note  Patient Details Name: William Mann MRN: [id number] DOB: 1929-04-04   Cancelled treatment:   Pt out of room at procedure. Will continue efforts.  Xin Klawitter L. Tivis Ringer, [place] CCC/SLP Acute Rehabilitation Services Office number [phone] Pager [phone]         Assunta Curtis 06/03/2019, 3:16 PM

## 2019-06-03 NOTE — Progress Notes (Signed)
PROGRESS NOTE    Teriq Rabon  [id number] DOB: [birth year] DOA: 05/28/2019 PCP: Patient, No Pcp Per    Brief Narrative:  86 year old gentleman with history of peripheral vascular disease, hypertension, hyperlipidemia, DVT on Eliquis, permanent pacemaker, carotid stenosis status post right carotid endarterectomy, COVID-19 infection in January 2021 presented to the emergency room with slurred speech and facial droop as well as recurrent fall.  Initially presented to the emergency room on 4/20 with fall, negative neuro exam and was sent home.  Subsequently developed slurred speech, unsteady gait, right leg weakness right facial droop and coughing and choking after eating so brought back to the ER.  An MRI showed small acute infarct in the left basal ganglia/corona radiata and right thalamus.  Patient remains awake, lethargic, possibly has more strokes.   Assessment & Plan:   Active Problems:   Stroke (cerebrum) (HCC)   CVA (cerebral vascular accident) ([place])   Coronary artery disease involving native coronary artery of native heart without angina pectoris   Deep venous thrombosis (HCC)   Fatty liver   Dementia without behavioral disturbance (HCC)   Tachycardia   Hypertension   Leukocytosis  Acute bilateral ischemic stroke: Left basal ganglia stroke, right thalamic infarct secondary to large vessel disease: Clinical findings, right facial droop, right hemiplegia and dysphagia CT head findings, initially negative.  Follow-up CT scan with new periventricular infarct MRI of the brain, left basal ganglia and right thalamic infarcts.  Brain atrophy. CT of the head and neck, No large vessel occlusion.  Left ICA plaque with 50% stenosis.  Right CEA patent.  Bilateral PCA moderate to severe stenosis. 2D echocardiogram, EF 55%.  Grade 1 diastolic dysfunction.  No emboli. Antiplatelet therapy, on aspirin 81 mg and Eliquis 5 mg twice a day before event, continued LDL, 160 statin, Zocor  increased from 10-40. Hemoglobin A1c, 5.  No indication for treatment. Therapy recommendations, acute inpatient rehab. 4/26-4/27, remains persistently sleepy lethargic, intermittent variable weakness of the right arm, may have more strokes.  Discussed with family.  Getting MRI of the brain today.  Dysphagia: Due to acute a stroke.  Maintain on core track feeding.  If patient is having more strokes, will have palliative discussion.  Intracranial stenosis: Medical management.  Avoid low blood pressure.   Goal of care: Patient was examined with family, patient's wife and son at the bedside.  He was unable to participate fully.  He had just work with physical therapy.  Family noted that patient has fluctuating mental status and also fluctuating right upper extremity weakness.  We discussed about rehab options, how he is going to respond to rehab, if his swallowing will return or not.  CT scan dated, 4/26 shows more stroke than before. We will order MRI brain today. We will also order palliative care consult to continue to talk about goal of care.  DVT prophylaxis: Eliquis Code Status: Full code Family Communication: Wife at the bedside.  Son at bedside Disposition Plan: Status is: Inpatient  Remains inpatient appropriate because:Inpatient level of care appropriate due to severity of illness   Dispo: The patient is from: Home              Anticipated d/c is to: CIR              Anticipated d/c date is: 2 days              Patient currently is not medically stable to d/c.  Consultants:   Neurology, signed off  Curbside consult with neuro  Procedures:   None  Antimicrobials:   None   Subjective: Patient seen and examined.  On my examination, patient had just work with physical therapy and they had transferred him to recliner.  He was hard to wake up and keep up conversation.  Hardly responded with yes and no. His right upper extremity, he was not able to hold up  against gravity that his son at bedside says new weakness. Weak cough, unable to swallow.  Objective: Vitals:   06/02/19 2346 06/03/19 0330 06/03/19 0757 06/03/19 1158  BP: 128/78 98/61 118/70 126/73  Pulse: 96 90 93 81  Resp:  16 20 20   Temp: (!) 97.5 F (36.4 C) 97.6 F (36.4 C) 97.8 F (36.6 C) (!) 97.5 F (36.4 C)  TempSrc: Axillary Axillary Oral Oral  SpO2: 95% 96% 93% 97%   No intake or output data in the 24 hours ending 06/03/19 1438 There were no vitals filed for this visit.  Examination:  General exam: Appears calm and comfortable, sick looking.  Mostly sleepy and lethargic. Tube feeding running. Respiratory system: Clear to auscultation. Respiratory effort normal.  No added sound. Cardiovascular system: S1 & S2 heard, RRR.  Pacemaker present.   Gastrointestinal system: Abdomen is nondistended, soft and nontender.  Bowel sounds present.   Central nervous system: Sleepy.  Lethargic.  Difficult to keep awake and open eyes. Left upper and lower extremities normal. Right facial droop. Dysphonia, difficulty understanding words. Right upper extremity 2/5 Right lower extremity 4/5 Psychiatry: Judgement and insight appear compromised. Mood & affect flat.    Data Reviewed: I have personally reviewed following labs and imaging studies  CBC: Recent Labs  Lab 05/28/19 1154 05/28/19 1216 05/30/19 0707 05/31/19 0321 06/01/19 0919 06/02/19 0351 06/03/19 0243  WBC 9.1   < > 15.8* 13.3* 11.8* 15.1* 14.5*  NEUTROABS 7.3  --   --  10.8*  --   --   --   HGB 15.2   < > 16.1 15.5 14.2 15.5 13.8  HCT 45.9   < > 46.6 44.7 41.9 44.5 41.8  MCV 104.1*   < > 100.0 98.7 100.5* 99.8 101.5*  PLT 174   < > 199 180 174 198 181   < > = values in this interval not displayed.   Basic Metabolic Panel: Recent Labs  Lab 05/29/19 1147 05/29/19 1537 05/31/19 0321 06/01/19 0919 06/02/19 0351 06/02/19 1530 06/03/19 0243  NA 140   < > 139 140 138 138 134*  K 3.9   < > 3.4* 3.2* 3.8  3.8 4.1  CL 107   < > 106 105 105 102 101  CO2 16*   < > 23 27 25 26 26   GLUCOSE 104*   < > 126* 104* 134* 134* 125*  BUN 14   < > 22 24* 23 25* 30*  CREATININE 0.75   < > 0.63 0.70 0.65 0.65 0.82  CALCIUM 8.5*   < > 8.6* 8.6* 8.7* 8.8* 8.6*  MG 1.9  --   --  2.1  --   --   --   PHOS  --   --   --  2.7  --   --   --    < > = values in this interval not displayed.   GFR: Estimated Creatinine Clearance: 59.9 mL/min (by C-G formula based on SCr of 0.82 mg/dL). Liver Function Tests: Recent Labs  Lab 05/27/19 1629 05/28/19  1154 05/31/19 0321 06/02/19 1530  AST 31 32 53* 149*  ALT 25 25 26  170*  ALKPHOS 63 74 90 136*  BILITOT 0.7 1.4* 2.0* 1.7*  PROT 5.9* 6.1* 6.0* 5.8*  ALBUMIN 3.2* 3.4* 3.1* 2.7*   No results for input(s): LIPASE, AMYLASE in the last 168 hours. No results for input(s): AMMONIA in the last 168 hours. Coagulation Profile: Recent Labs  Lab 05/27/19 1629 05/28/19 1154  INR 1.1 1.1   Cardiac Enzymes: No results for input(s): CKTOTAL, CKMB, CKMBINDEX, TROPONINI in the last 168 hours. BNP (last 3 results) No results for input(s): PROBNP in the last 8760 hours. HbA1C: No results for input(s): HGBA1C in the last 72 hours. CBG: Recent Labs  Lab 06/02/19 2135 06/02/19 2349 06/03/19 0354 06/03/19 0758 06/03/19 1210  GLUCAP 129* 102* 143* 118* 149*   Lipid Profile: No results for input(s): CHOL, HDL, LDLCALC, TRIG, CHOLHDL, LDLDIRECT in the last 72 hours. Thyroid Function Tests: No results for input(s): TSH, T4TOTAL, FREET4, T3FREE, THYROIDAB in the last 72 hours. Anemia Panel: No results for input(s): VITAMINB12, FOLATE, FERRITIN, TIBC, IRON, RETICCTPCT in the last 72 hours. Sepsis Labs: Recent Labs  Lab 05/27/19 1629 05/29/19 1500 05/29/19 1511 05/29/19 1807 05/30/19 0707 06/02/19 1530 06/02/19 1629  PROCALCITON  --  0.39  --   --  0.11 0.16  --   LATICACIDVEN   < >  --  1.8 2.3*  --  1.7 2.0*   < > = values in this interval not displayed.     Recent Results (from the past 240 hour(s))  SARS CORONAVIRUS 2 (TAT 6-24 HRS) Nasopharyngeal Nasopharyngeal Swab     Status: None   Collection Time: 05/28/19  5:10 PM   Specimen: Nasopharyngeal Swab  Result Value Ref Range Status   SARS Coronavirus 2 NEGATIVE NEGATIVE Final    Comment: (NOTE) SARS-CoV-2 target nucleic acids are NOT DETECTED. The SARS-CoV-2 RNA is generally detectable in upper and lower respiratory specimens during the acute phase of infection. Negative results do not preclude SARS-CoV-2 infection, do not rule out co-infections with other pathogens, and should not be used as the sole basis for treatment or other patient management decisions. Negative results must be combined with clinical observations, patient history, and epidemiological information. The expected result is Negative. Fact Sheet for Patients: SugarRoll.be Fact Sheet for Healthcare Providers: https://www.woods-mathews.com/ This test is not yet approved or cleared by the [place] FDA and  has been authorized for detection and/or diagnosis of SARS-CoV-2 by FDA under an Emergency Use Authorization (EUA). This EUA will remain  in effect (meaning this test can be used) for the duration of the COVID-19 declaration under Section 56 4(b)(1) of the Act, 21 U.S.C. section 360bbb-3(b)(1), unless the authorization is terminated or revoked sooner. Performed at [facility], [place] [address] [place], [place] [address]   Culture, blood (x 2)     Status: None (Preliminary result)   Collection Time: 06/02/19  3:30 PM   Specimen: BLOOD LEFT HAND  Result Value Ref Range Status   Specimen Description BLOOD LEFT HAND  Final   Special Requests   Final    BOTTLES DRAWN AEROBIC AND ANAEROBIC Blood Culture adequate volume   Culture   Final    NO GROWTH < 12 HOURS Performed at [facility], [place] [address] [place], [place] [address]    Report Status PENDING   Incomplete  Culture, blood (x 2)     Status: None (Preliminary result)  Collection Time: 06/02/19  3:30 PM   Specimen: BLOOD RIGHT HAND  Result Value Ref Range Status   Specimen Description BLOOD RIGHT HAND  Final   Special Requests   Final    BOTTLES DRAWN AEROBIC AND ANAEROBIC Blood Culture results may not be optimal due to an inadequate volume of blood received in culture bottles   Culture   Final    NO GROWTH < 12 HOURS Performed at [facility], [place] [address], [place], [place] [address]    Report Status PENDING  Incomplete         Radiology Studies: CT HEAD WO CONTRAST  Result Date: 06/02/2019 CLINICAL DATA:  Altered mental status. Possible cephalopathy. EXAM: CT HEAD WITHOUT CONTRAST TECHNIQUE: Contiguous axial images were obtained from the base of the skull through the vertex without intravenous contrast. COMPARISON:  May 28, 2019. FINDINGS: Brain: Mild diffuse cortical atrophy is noted. Mild chronic ischemic white matter disease is noted. Ventricular size is within normal limits. New low density is noted in left periventricular white matter measuring 2.0 x 1.3 cm concerning for acute infarction. Old lacunar infarction is noted more anteriorly which is unchanged compared to prior exam. No hemorrhage or mass lesion is noted. Vascular: No hyperdense vessel or unexpected calcification. Skull: Normal. Negative for fracture or focal lesion. Sinuses/Orbits: No acute finding. Other: None. IMPRESSION: New low density is noted in left periventricular white matter concerning for acute infarction. MRI may be performed for further evaluation. Electronically Signed   By: Marijo Conception M.D.   On: 06/02/2019 16:17   DG CHEST PORT 1 VIEW  Result Date: 06/02/2019 CLINICAL DATA:  Shortness of breath EXAM: PORTABLE CHEST 1 VIEW COMPARISON:  05/29/2019 FINDINGS: Single frontal view of the chest demonstrates enteric catheter passing below diaphragm tip overlying gastric antrum. Dual lead  pacemaker overlying left chest unchanged. Cardiac silhouette is enlarged but stable. Mild atherosclerosis aortic arch unchanged. No airspace disease, effusion, or pneumothorax. Stable scarring at the left base. No acute bony abnormalities. IMPRESSION: 1. Enteric catheter as above. 2. No acute intrathoracic process. Electronically Signed   By: Randa Ngo M.D.   On: 06/02/2019 15:41   EEG adult  Result Date: 06/03/2019 Lora Havens, MD     06/03/2019  8:15 AM Patient Name: Yamen Haith MRN: [id number] Epilepsy Attending: Lora Havens Referring Physician/Provider: Dr. Berle Mull Date: 06/02/2019 Duration: 24.58 minutes Patient history: 86 year old male with acute stroke and continued waxing and waning mentation.  EEG to evaluate for seizures. Level of alertness: Awake, asleep AEDs during EEG study: None Technical aspects: This EEG study was done with scalp electrodes positioned according to the 10-20 International system of electrode placement. Electrical activity was acquired at a sampling rate of 500Hz  and reviewed with a high frequency filter of 70Hz  and a low frequency filter of 1Hz . EEG data were recorded continuously and digitally stored. Description: During awake state, no clear posterior dominant was seen.  Sleep was characterized by sleep spindles (12 to 14 Hz), maximal frontocentral region.  EEG showed continuous generalized polymorphic 3 to 6 Hz theta-delta slowing. Hyperventilation and photic stimulation were not performed. Abnormality -Continuous slow, generalized IMPRESSION: This study is suggestive of moderate diffuse encephalopathy, nonspecific etiology. No seizures or epileptiform discharges were seen throughout the recording. Priyanka Barbra Sarks        Scheduled Meds: . apixaban  5 mg Per Tube BID  . aspirin  81 mg Per Tube Daily  . doxazosin  1 mg Per  Tube Daily  . feeding supplement (PRO-STAT SUGAR FREE 64)  30 mL Per Tube Daily  . free water  110 mL Per Tube Q4H  .  multivitamin  15 mL Per Tube Daily  . simvastatin  40 mg Per Tube q1800  . sodium chloride  1 spray Each Nare QID   Continuous Infusions: . feeding supplement (OSMOLITE 1.2 CAL) 1,000 mL (06/03/19 0238)     LOS: 6 days    Time spent: 30 minutes    Barb Merino, MD [facility] Pager [phone]

## 2019-06-03 NOTE — Progress Notes (Signed)
Physical Therapy Treatment Patient Details Name: William Mann MRN: [id number] DOB: December 21, 1929 Today's Date: 06/03/2019    History of Present Illness [name] is a 86 y.o. male with history of  R ICA stenosis s/p R CEA, PVD, hypertension, hyperlipidemia, lower extremity DVT on Eliquis, carotid artery occlusion, atherosclerosis and bilateral hearing loss presenting with dysarthria, facial droop, R leg weakness.  Found to have L BG/CR and R PLIC/thalamic infarcts.    PT Comments    Patient is making progress toward PT goals and more alert and participatory this session. Pt able to stand X 5 trials with +2 assist and Stedy standing frame utilized for safety with OOB transfer due to R lateral lean/weakness in standing. Continue to recommend CIR for further skilled PT services to maximize independence and safety with mobility.     Follow Up Recommendations  CIR;Supervision/Assistance - 24 hour     Equipment Recommendations  None recommended by PT(defer to post acute)    Recommendations for Other Services       Precautions / Restrictions Precautions Precautions: Fall Restrictions Weight Bearing Restrictions: No    Mobility  Bed Mobility Overal bed mobility: Needs Assistance Bed Mobility: Supine to Sit     Supine to sit: Min assist     General bed mobility comments: MIN A for bed mobility to roll to pts R side; MIN A to elevate trunk into sitting and advance BLEs to EOB  Transfers Overall transfer level: Needs assistance Equipment used: Rolling walker (2 wheeled);2 person hand held assist Transfers: Sit to/from Stand Sit to Stand: Mod assist;Max assist;+2 physical assistance         General transfer comment: pt completed x5 sit<>stand trials with pt needing MOD- MAX A to power into standing and to correct R lateral lean. Pt unable to pivot BLEs in standing therefore utilized stedy  Ambulation/Gait                 Stairs              Wheelchair Mobility    Modified Rankin (Stroke Patients Only) Modified Rankin (Stroke Patients Only) Pre-Morbid Rankin Score: Moderate disability Modified Rankin: Severe disability     Balance Overall balance assessment: Needs assistance Sitting-balance support: Feet supported;Bilateral upper extremity supported Sitting balance-Leahy Scale: Poor   Postural control: Right lateral lean Standing balance support: Bilateral upper extremity supported Standing balance-Leahy Scale: Poor                              Cognition Arousal/Alertness: Awake/alert Behavior During Therapy: Flat affect Overall Cognitive Status: Impaired/Different from baseline Area of Impairment: Orientation;Following commands;Attention                 Orientation Level: Disoriented to;Place(states hes is at ) Current Attention Level: Focused   Following Commands: Follows one step commands consistently;Follows multi-step commands with increased time Safety/Judgement: Decreased awareness of deficits;Decreased awareness of safety     General Comments: noted to be more alert than previous session and following single step commands      Exercises      General Comments        Pertinent Vitals/Pain Pain Assessment: No/denies pain    Home Living                      Prior Function            PT Goals (current  goals can now be found in the care plan section) Progress towards PT goals: Progressing toward goals    Frequency    Min 4X/week      PT Plan Current plan remains appropriate    Co-evaluation PT/OT/SLP Co-Evaluation/Treatment: Yes Reason for Co-Treatment: Complexity of the patient's impairments (multi-system involvement);Necessary to address cognition/behavior during functional activity;For patient/therapist safety;To address functional/ADL transfers PT goals addressed during session: Mobility/safety with mobility;Balance        AM-PAC  PT "6 Clicks" Mobility   Outcome Measure  Help needed turning from your back to your side while in a flat bed without using bedrails?: A Little Help needed moving from lying on your back to sitting on the side of a flat bed without using bedrails?: A Little Help needed moving to and from a bed to a chair (including a wheelchair)?: A Lot Help needed standing up from a chair using your arms (e.g., wheelchair or bedside chair)?: A Lot Help needed to walk in hospital room?: Total Help needed climbing 3-5 steps with a railing? : Total 6 Click Score: 12    End of Session Equipment Utilized During Treatment: Gait belt Activity Tolerance: Patient tolerated treatment well Patient left: with call bell/phone within reach;with family/visitor present;in chair;with chair alarm set Nurse Communication: Mobility status PT Visit Diagnosis: Other abnormalities of gait and mobility (R26.89);Muscle weakness (generalized) (M62.81);Other symptoms and signs involving the nervous system [id number])     Time: [id number] PT Time Calculation (min) (ACUTE ONLY): 34 min  Charges:  $Gait Training: 8-22 mins                     Earney Navy, PTA Acute Rehabilitation Services Pager: [phone] Office: [phone]     Darliss Cheney 06/03/2019, 3:49 PM

## 2019-06-03 NOTE — Progress Notes (Signed)
Occupational Therapy Treatment Patient Details Name: Ausar Rigler MRN: [id number] DOB: 11/21/1929 Today's Date: 06/03/2019    History of present illness Mr. Bethel Deery is a 86 y.o. male with history of  R ICA stenosis s/p R CEA, PVD, hypertension, hyperlipidemia, lower extremity DVT on Eliquis, carotid artery occlusion, atherosclerosis and bilateral hearing loss presenting with dysarthria, facial droop, R leg weakness.  Found to have L BG/CR and R PLIC/thalamic infarcts.   OT comments  Pt making steady progress towards OT goals this session. Session focus on functional mobility and seated ADLs. Overall, pt requires MINA for bed mobility, MOD- MAX A +2 to sit<>stand to stedy d/t R lateral lean, and set- up assist for seated ADLs. Pt presents with ideational apraxia noted to attempt to brush hair with suction, however pt much more alert this session and able to follow commands and participate. Continue to recommend CIR level therapies at time of DC. Will continue to follow per POC.    Follow Up Recommendations  CIR;Supervision/Assistance - 24 hour;Other (comment)(pending progress)    Equipment Recommendations  3 in 1 bedside commode    Recommendations for Other Services      Precautions / Restrictions Precautions Precautions: Fall Restrictions Weight Bearing Restrictions: No       Mobility Bed Mobility Overal bed mobility: Needs Assistance Bed Mobility: Supine to Sit     Supine to sit: Min assist;+2 for safety/equipment     General bed mobility comments: MIN A for bed mobility to roll to pts R side; MIN A to elevate trunk into sitting and advance BLEs to EOB  Transfers Overall transfer level: Needs assistance Equipment used: Ambulation equipment used(stedy) Transfers: Sit to/from Stand Sit to Stand: Mod assist;Max assist;+2 physical assistance         General transfer comment: pt completed x5 sit<>stand trials with pt needing MOD- MAX A to power into  standing and to correct R lateral lean. Pt unable to pivot BLEs in standing therefore utilized stedy    Balance Overall balance assessment: Needs assistance Sitting-balance support: Feet supported;Bilateral upper extremity supported Sitting balance-Leahy Scale: Poor   Postural control: Right lateral lean Standing balance support: Bilateral upper extremity supported Standing balance-Leahy Scale: Poor Standing balance comment: R lateral lean in standing needing +2 assist                           ADL either performed or assessed with clinical judgement   ADL Overall ADL's : Needs assistance/impaired     Grooming: Wash/dry face;Brushing hair;Sitting;Set up;Cueing for sequencing Grooming Details (indicate cue type and reason): set-up assist to brush hair and wash fac from recliner; noted to present attempt to brush hair with suction         Upper Body Dressing : Minimal assistance;Sitting Upper Body Dressing Details (indicate cue type and reason): don new hospital gown     Toilet Transfer: Total assistance;+2 for physical assistance;Moderate assistance;Maximal assistance Toilet Transfer Details (indicate cue type and reason): simulated to recliner with use of stedy; MOD- MAX A +2 for sit<> stand to stedy         Functional mobility during ADLs: Moderate assistance;Maximal assistance(sit<>stand to stedy) General ADL Comments: pt more awake and alert able to follow commands and participate in session. Session focus on seated ADLs and functional mobility via stedy. pt complete x5 sit<>stands needing MOD- MAX A +2 d/t heavy R lateral lean.     Vision Baseline Vision/History: Wears glasses  Wears Glasses: At all times Patient Visual Report: Other (comment)(required cues to brush left side of pts hair)     Perception     Praxis      Cognition Arousal/Alertness: Awake/alert Behavior During Therapy: Flat affect Overall Cognitive Status: Impaired/Different from  baseline Area of Impairment: Orientation;Following commands;Attention                 Orientation Level: Disoriented to;Place(states hes is at [place]) Current Attention Level: Focused   Following Commands: Follows one step commands consistently;Follows multi-step commands with increased time       General Comments: noted to be more alert than previous session; states he is at [place] long but able to follow commands durign session        Exercises     Shoulder Instructions       General Comments pts son present throughout session stating pts attention is much more improved compared to yesterday. discussed continued recommendation for CIR d/t pts improved attention.    Pertinent Vitals/ Pain       Pain Assessment: No/denies pain  Home Living                                          Prior Functioning/Environment              Frequency  Min 2X/week        Progress Toward Goals  OT Goals(current goals can now be found in the care plan section)  Progress towards OT goals: Progressing toward goals  Acute Rehab OT Goals Patient Stated Goal: to go to rehab OT Goal Formulation: With patient/family Time For Goal Achievement: 06/13/19 Potential to Achieve Goals: Good  Plan Discharge plan remains appropriate    Co-evaluation    PT/OT/SLP Co-Evaluation/Treatment: Yes Reason for Co-Treatment: Complexity of the patient's impairments (multi-system involvement);To address functional/ADL transfers;For patient/therapist safety;Necessary to address cognition/behavior during functional activity   OT goals addressed during session: ADL's and self-care      AM-PAC OT "6 Clicks" Daily Activity     Outcome Measure   Help from another person eating meals?: A Lot Help from another person taking care of personal grooming?: A Lot Help from another person toileting, which includes using toliet, bedpan, or urinal?: Total Help from another person  bathing (including washing, rinsing, drying)?: A Lot Help from another person to put on and taking off regular upper body clothing?: A Lot Help from another person to put on and taking off regular lower body clothing?: Total 6 Click Score: 10    End of Session Equipment Utilized During Treatment: Rolling walker;Other (comment)(stedy)  OT Visit Diagnosis: Unsteadiness on feet (R26.81);Muscle weakness (generalized) (M62.81);Other symptoms and signs involving cognitive function   Activity Tolerance Patient tolerated treatment well   Patient Left in chair;with call bell/phone within reach;with chair alarm set;with family/visitor present   Nurse Communication Mobility status;Other (comment)(may need lift to return to bed if pts sit up for more than 1 hour)        Time: [id number] OT Time Calculation (min): 31 min  Charges: OT General Charges $OT Visit: 1 Visit OT Treatments $Self Care/Home Management : 8-22 mins  Lanier Clam., COTA/L Acute Rehabilitation Services [phone] [place] 06/03/2019, 9:41 AM

## 2019-06-04 ENCOUNTER — Inpatient Hospital Stay (HOSPITAL_COMMUNITY): Payer: Medicare Other

## 2019-06-04 DIAGNOSIS — Z7189 Other specified counseling: Secondary | ICD-10-CM

## 2019-06-04 DIAGNOSIS — R482 Apraxia: Secondary | ICD-10-CM

## 2019-06-04 DIAGNOSIS — I69359 Hemiplegia and hemiparesis following cerebral infarction affecting unspecified side: Secondary | ICD-10-CM

## 2019-06-04 DIAGNOSIS — I69391 Dysphagia following cerebral infarction: Secondary | ICD-10-CM

## 2019-06-04 DIAGNOSIS — I639 Cerebral infarction, unspecified: Secondary | ICD-10-CM

## 2019-06-04 DIAGNOSIS — Z515 Encounter for palliative care: Secondary | ICD-10-CM

## 2019-06-04 LAB — GLUCOSE, CAPILLARY
Glucose-Capillary: 100 mg/dL — ABNORMAL HIGH (ref 70–99)
Glucose-Capillary: 108 mg/dL — ABNORMAL HIGH (ref 70–99)
Glucose-Capillary: 109 mg/dL — ABNORMAL HIGH (ref 70–99)
Glucose-Capillary: 113 mg/dL — ABNORMAL HIGH (ref 70–99)
Glucose-Capillary: 114 mg/dL — ABNORMAL HIGH (ref 70–99)
Glucose-Capillary: 137 mg/dL — ABNORMAL HIGH (ref 70–99)
Glucose-Capillary: 140 mg/dL — ABNORMAL HIGH (ref 70–99)

## 2019-06-04 LAB — BASIC METABOLIC PANEL
Anion gap: 9 (ref 5–15)
BUN: 34 mg/dL — ABNORMAL HIGH (ref 8–23)
CO2: 29 mmol/L (ref 22–32)
Calcium: 8.7 mg/dL — ABNORMAL LOW (ref 8.9–10.3)
Chloride: 103 mmol/L (ref 98–111)
Creatinine, Ser: 0.8 mg/dL (ref 0.61–1.24)
GFR calc Af Amer: >60 mL/min (ref >60)
GFR calc non Af Amer: >60 mL/min (ref >60)
Glucose, Bld: 127 mg/dL — ABNORMAL HIGH (ref 70–99)
Potassium: 4 mmol/L (ref 3.5–5.1)
Sodium: 141 mmol/L (ref 135–145)

## 2019-06-04 LAB — CBC
HCT: 41.3 % (ref 39.0–52.0)
Hemoglobin: 13.7 g/dL (ref 13.0–17.0)
MCH: 34.2 pg — ABNORMAL HIGH (ref 26.0–34.0)
MCHC: 33.2 g/dL (ref 30.0–36.0)
MCV: 103 fL — ABNORMAL HIGH (ref 80.0–100.0)
Platelets: 194 10*3/uL (ref 150–400)
RBC: 4.01 MIL/uL — ABNORMAL LOW (ref 4.22–5.81)
RDW: 13.6 % (ref 11.5–15.5)
WBC: 10.7 10*3/uL — ABNORMAL HIGH (ref 4.0–10.5)
nRBC: 0 % (ref 0.0–0.2)

## 2019-06-04 MED ORDER — PANTOPRAZOLE SODIUM 40 MG IV SOLR
40.0000 mg | Freq: Once | INTRAVENOUS | Status: AC
  Administered 2019-06-05: 40 mg via INTRAVENOUS
  Filled 2019-06-04: qty 40

## 2019-06-04 NOTE — Progress Notes (Addendum)
STROKE TEAM PROGRESS NOTE   INTERVAL HISTORY Stroke neurology called back because of worsening speech and right side weakness. EEG neg for seizures. MRI repeated 4/27, which shows extension of the left BG stroke. Palliative Care has been consulted. Unfortunately, there is nothing further that can be done. I personally reviewed history of presenting illness, electronic medical records and imaging films in PACS.  I spoke to the patient's wife at the bedside and answered questions.  Also discussed with palliative care nurse practitioner Vitals:   06/04/19 0000 06/04/19 0400 06/04/19 0832 06/04/19 1146  BP: 118/69 119/83 112/76 100/68  Pulse: 98 100 (!) 107 99  Resp:   16 16  Temp: (!) 97.5 F (36.4 C) 98.4 F (36.9 C) 97.7 F (36.5 C) 97.9 F (36.6 C)  TempSrc: Axillary Axillary Axillary Oral  SpO2: 95% 95% 97% 97%    CBC:  Recent Labs  Lab 05/31/19 0321 06/01/19 0919 06/03/19 0243 06/04/19 0453  WBC 13.3*   < > 14.5* 10.7*  NEUTROABS 10.8*  --   --   --   HGB 15.5   < > 13.8 13.7  HCT 44.7   < > 41.8 41.3  MCV 98.7   < > 101.5* 103.0*  PLT 180   < > 181 194   < > = values in this interval not displayed.    Basic Metabolic Panel:  Recent Labs  Lab 05/29/19 1147 05/29/19 1537 06/01/19 0919 06/02/19 0351 06/03/19 0243 06/04/19 0453  NA 140   < > 140   < > 134* 141  K 3.9   < > 3.2*   < > 4.1 4.0  CL 107   < > 105   < > 101 103  CO2 16*   < > 27   < > 26 29  GLUCOSE 104*   < > 104*   < > 125* 127*  BUN 14   < > 24*   < > 30* 34*  CREATININE 0.75   < > 0.70   < > 0.82 0.80  CALCIUM 8.5*   < > 8.6*   < > 8.6* 8.7*  MG 1.9  --  2.1  --   --   --   PHOS  --   --  2.7  --   --   --    < > = values in this interval not displayed.   Lipid Panel:     Component Value Date/Time   CHOL 222 (H) 05/29/2019 1147   TRIG 43 05/29/2019 1147   HDL 53 05/29/2019 1147   CHOLHDL 4.2 05/29/2019 1147   VLDL 9 05/29/2019 1147   LDLCALC 160 (H) 05/29/2019 1147   HgbA1c:  Lab  Results  Component Value Date   HGBA1C 5.0 05/29/2019   Urine Drug Screen:     Component Value Date/Time   LABOPIA NONE DETECTED 05/28/2019 1300   COCAINSCRNUR NONE DETECTED 05/28/2019 1300   LABBENZ NONE DETECTED 05/28/2019 1300   AMPHETMU NONE DETECTED 05/28/2019 1300   THCU NONE DETECTED 05/28/2019 1300   LABBARB NONE DETECTED 05/28/2019 1300    Alcohol Level     Component Value Date/Time   ETH <10 05/28/2019 1154    IMAGING past 24 hours MR BRAIN WO CONTRAST  Result Date: 06/03/2019 CLINICAL DATA:  Follow-up left periventricular white matter stroke. Small acute infarctions in the left basal ganglia and right thalamus. EXAM: MRI HEAD WITHOUT CONTRAST TECHNIQUE: Multiplanar, multiecho pulse sequences of the brain and surrounding structures were  obtained without intravenous contrast. COMPARISON:  Head CT yesterday.  MRI 05/28/2019 FINDINGS: Brain: There is expected evolutionary change within the right lateral thalamic infarction which is losing its restricted diffusion. No evidence of progression or extension of that infarction. There has been progression of acute infarction within the left basal ganglia and radiating white matter tracts compared to the study of 6 days ago. Evidence of hemorrhage or mass effect. No other acute infarction. No focal abnormality affects the brainstem. There is cerebellar atrophy. Cerebral hemispheres show generalized atrophy with chronic small vessel disease of the white matter and old infarctions in the left basal ganglia region. No large vessel territory infarction. Ex vacuo enlargement of the left lateral ventricle. No obstructive hydrocephalus. No extra-axial collection. Vascular: Major vessels at the base of the brain show flow. Skull and upper cervical spine: Negative Sinuses/Orbits: Clear/normal Other: None IMPRESSION: Extension of the left basal ganglia/radiating white matter tract infarction which is larger than on the initial presentation, measuring up  to 2 cm in size presently. No sign of hemorrhage or mass effect. Expected evolutionary changes in the small right lateral thalamic acute infarction without progression. Extensive chronic small-vessel ischemic changes elsewhere throughout the deep brain as seen previously. Generalized brain atrophy. Electronically Signed   By: Nelson Chimes M.D.   On: 06/03/2019 17:36   EEG on 4/27: This study is suggestive of moderate diffuse encephalopathy, nonspecific etiology. No seizures or epileptiform discharges were seen throughout the recording.  PHYSICAL EXAM Elderly Caucasian male sitting up in bed.  Not in distress Temp:  [97.5 F (36.4 C)-98.4 F (36.9 C)] 97.9 F (36.6 C) (04/28 1146) Pulse Rate:  [90-108] 99 (04/28 1146) Resp:  [16-19] 16 (04/28 1146) BP: (100-132)/(62-87) 100/68 (04/28 1146) SpO2:  [95 %-97 %] 97 % (04/28 1146)  General - frail, mild distress. Cardiovascular - irregular heart rhythm, frequent PVCs on tele. Skin- exceedingly dry, but warm, intact Neuro - Awakes to voice, but dozes off to sleep easily. Speech is barely intelligible, can only make out his name. Not able to state place nor to time. Able to follow simple commands very slow and delayed processing. PERRL, EOM and VF test is limited d/t wakefulnes. Right facial droop. Tongue midline. LUE and BLEs 4/5, RUE 1/5 and unable to hold against any gravity. Sensation symmetrical as per pt. Unable to test ataxia or gait   ASSESSMENT/PLAN [name] is a 86 y.o. male with history of  R ICA stenosis s/p R CEA, PVD, hypertension, hyperlipidemia, lower extremity DVT on Eliquis, carotid artery occlusion, atherosclerosis and bilateral hearing loss presenting with dysarthria, facial droop, R leg weakness.   Stroke:   L BG/CR and R PLIC/thalamic infarcts secondary to small vessel disease    CT head No acute abnormality. Small vessel disease. Atrophy.   MRI  Small L basal ganglia / corona radiata and R thalamic infarcts.  Small vessel disease. Atrophy.   Repeated MRI 4/27: Extended BG stroke.  CTA head & neck no LVO. L ICA plaque w/ 50% stenosis. R CEA patent. R>L supraclinoid ICA and B PCA moderate to severe stenoses.   LE Doppler  R gastrocnemius veing indeterminate thrombosis. R femoral w/ chronic DVT.  2D Echo EF 55%, LA mild dilation  LDL 160  HgbA1c 5.0  Heparin for VTE prophylaxis    interrogate St Jude's PPM to rule out afib - pending   aspirin 81 mg daily and Eliquis (apixaban) daily prior to admission, now on  eliquis   Therapy  recommendations:  clr  Disposition:  pending   Intracranial stenosis   CTA head and neck - R>L supraclinoid ICA and B PCA moderate to severe stenoses.   Continue eliquis and ASA once po access  Avoid low BP  Long term BP goal 120-150   RLE DVT   on Xarelto initially, switched to Eliquis one year ago  LE Doppler this admission - R gastrocnemius veing indeterminate thrombosis. R femoral w/ chronic DVT.  Now on IV heparin  Continue eliquis .  Frequent PVCs and VTs  Frequent PVCs and VTs on tele  Discussed with Dr. Posey Pronto - will consider beta blockers  BP goal 120-150  Consider cardiology consult if needed  Hypertension  BP elevated on admission at 177/155  Stable today . Permissive hypertension (OK if < 220/120) but gradually normalize in 2-3 days . Long-term BP goal 120-150  Hyperlipidemia  Home meds:  zocor 10  Now on zocor 40  LDL 160, goal < 70  Continue statin at discharge  Other Stroke Risk Factors  Advanced age  Family hx stroke (brother)  Coronary artery disease  PVD  Hx R ICA stenosis s/p R CEA  PPM  Other Active Problems  COVID-19 infection 3 mos ago, recovered - this admission neg  BPH on flomax  Extended stroke with clinical worseing on 4/27. Agree with Palliative Care consult at this time   Hospital day # 7  Neurology will sign off. Please call with questions. Pt will follow up with stroke clinic  NP at [facility] in about 4 weeks, unless Hospice is plan Desiree Metzger-Cihelka, ARNP-C, ANVP-BC Pager: [phone] 06/04/2019 2:43 PM I have personally obtained history,examined this patient, reviewed notes, independently viewed imaging studies, participated in medical decision making and plan of care.ROS completed by me personally and pertinent positives fully documented  I have made any additions or clarifications directly to the above note. Agree with note above.  Patient has had worsening speech and right arm weakness secondary to extension of his recent left basal ganglia infarct.  Unfortunately there is nothing much we can do at this point.  Continue Eliquis for stroke prevention and aggressive risk factor modification.  Continue ongoing therapies and transfer to rehab if family wishes.  Given his poor general medical condition I agree Perative care consult is appropriate.  Discussed with patient and wife and answered questions.  Discussed with Dr. Everlene Balls.  Greater than 50% time during this 25-minute visit was spent on counseling and coordination of care and answering questions.  Antony Contras, MD Medical Director [facility] Pager: [phone] 06/04/2019 2:46 PM    To contact Stroke Continuity provider, please refer to [email or web address] After hours, contact General Neurology

## 2019-06-04 NOTE — Consult Note (Signed)
                                                                                 Consultation Note Date: 06/04/2019   Patient Name: William Mann  DOB: 12/04/1929  MRN: [id number]  Age / Sex: 86 y.o., male  PCP: Patient, No Pcp Per Referring Physician: Ghimire, Kuber, MD  Reason for Consultation: Establishing goals of care  HPI/Patient Profile: 86 y.o. male  with past medical history of HTN, hyperlipidemia, DVT on Eliquis, CAD, and right carotid stenosis s/p endarderectomy admitted on 05/28/2019 with acute ischemic stroke. Mental status worsened on 4/26 and CT showed new low density on the left concerning for acute infarction. Palliative care has been consulted to discuss GOC in the setting of extension of the CVA and clinical findings of right hemiplegia, dysarthria, and dysphagia.    Clinical Assessment and Goals of Care: I have reviewed medical records including EPIC notes, labs and imaging, examined the patient and met at bedside to discuss diagnosis prognosis, GOC, EOL wishes, disposition and options. Patient's wife William is present at bedside  I introduced Palliative Medicine as specialized medical care for people living with serious illness. It focuses on providing relief from the symptoms and stress of a serious illness.   Advanced directives were considered and reviewed. Patient does have a living will and HCPOA; I have placed a copy of these documents in the chart. These documents state "I also direct that life-prolonging procedures be withheld or withdraw if I am in a permanent vegetative state, or have a terminal illness or an incurable condition, and am therefore unable to experience a meaningful life". His advanced directives also state "I want my Health Care Agent to consider the relief of suffering, my personal beliefs, the expense involved and the quality as well as the possible extension of my life in making decisions for me concerning life-prolonging measures".   William  states that her son will be here tomorrow from [place] and she would like to include him in the discussion. Plan is for a more detailed GOC meeting tomorrow at 11:30.   William was encouraged to call with questions or concerns.   Primary decision maker: William Mann (wife and HCPOA).   Plan of care discussed with Dr. Sethi and  Amanda SLP.   SUMMARY OF RECOMMENDATIONS   - Advanced directives reviewed and placed on chart - continue full scope of treatment for now - plan for full GOC discussion tomorrow at 11:30 with patient's wife and son  Code Status/Advance Care Planning:  Full code  Palliative Prophylaxis:   Aspiration, Frequent Pain Assessment and Turn Reposition  Prognosis:   Unable to determine  Discharge Planning: To Be Determined      Primary Diagnoses: Present on Admission: . CVA (cerebral vascular accident) (HCC)   I have reviewed the medical record, interviewed the patient and family, and examined the patient. The following aspects are pertinent.  Past Medical History:  Diagnosis Date  . Aortic atherosclerosis (HCC)   . Bilateral hearing loss   . BPH (benign prostatic hyperplasia)   . Cancer (HCC)    basal cell carcinoma  . Carotid artery occlusion   .                                                                                    Consultation Note Date: 06/04/2019   Patient Name: William Mann  DOB: 12/04/1929  MRN: [id number]  Age / Sex: 86 y.o., male  PCP: Patient, No Pcp Per Referring Physician: Ghimire, Kuber, MD  Reason for Consultation: Establishing goals of care  HPI/Patient Profile: 86 y.o. male  with past medical history of HTN, hyperlipidemia, DVT on Eliquis, CAD, and right carotid stenosis s/p endarderectomy admitted on 05/28/2019 with acute ischemic stroke. Mental status worsened on 4/26 and CT showed new low density on the left concerning for acute infarction. Palliative care has been consulted to discuss GOC in the setting of extension of the CVA and clinical findings of right hemiplegia, dysarthria, and dysphagia.    Clinical Assessment and Goals of Care: I have reviewed medical records including EPIC notes, labs and imaging, examined the patient and met at bedside to discuss diagnosis prognosis, GOC, EOL wishes, disposition and options. Patient's wife William is present at bedside  I introduced Palliative Medicine as specialized medical care for people living with serious illness. It focuses on providing relief from the symptoms and stress of a serious illness.   Advanced directives were considered and reviewed. Patient does have a living will and HCPOA; I have placed a copy of these documents in the chart. These documents state "I also direct that life-prolonging procedures be withheld or withdraw if I am in a permanent vegetative state, or have a terminal illness or an incurable condition, and am therefore unable to experience a meaningful life". His advanced directives also state "I want my Health Care Agent to consider the relief of suffering, my personal beliefs, the expense involved and the quality as well as the possible extension of my life in making decisions for me concerning life-prolonging measures".   William  states that her son will be here tomorrow from [place] and she would like to include him in the discussion. Plan is for a more detailed GOC meeting tomorrow at 11:30.   William was encouraged to call with questions or concerns.   Primary decision maker: William Mann (wife and HCPOA).   Plan of care discussed with Dr. Sethi and  Amanda SLP.   SUMMARY OF RECOMMENDATIONS   - Advanced directives reviewed and placed on chart - continue full scope of treatment for now - plan for full GOC discussion tomorrow at 11:30 with patient's wife and son  Code Status/Advance Care Planning:  Full code  Palliative Prophylaxis:   Aspiration, Frequent Pain Assessment and Turn Reposition  Prognosis:   Unable to determine  Discharge Planning: To Be Determined      Primary Diagnoses: Present on Admission: . CVA (cerebral vascular accident) (HCC)   I have reviewed the medical record, interviewed the patient and family, and examined the patient. The following aspects are pertinent.  Past Medical History:  Diagnosis Date  . Aortic atherosclerosis (HCC)   . Bilateral hearing loss   . BPH (benign prostatic hyperplasia)   . Cancer (HCC)    basal cell carcinoma  . Carotid artery occlusion   .   Consultation Note Date: 06/04/2019   Patient Name: William Mann  DOB: 05/21/1929  MRN: [id number]  Age / Sex: 86 y.o., male  PCP: Patient, No Pcp Per Referring Physician: Barb Merino, MD  Reason for Consultation: Establishing goals of care  HPI/Patient Profile: 86 y.o. male  with past medical history of HTN, hyperlipidemia, DVT on Eliquis, CAD, and right carotid stenosis s/p endarderectomy admitted on 05/28/2019 with acute ischemic stroke. Mental status worsened on 4/26 and CT showed new low density on the left concerning for acute infarction. Palliative care has been consulted to discuss [place] in the setting of extension of the CVA and clinical findings of right hemiplegia, dysarthria, and dysphagia.    Clinical Assessment and Goals of Care: I have reviewed medical records including EPIC notes, labs and imaging, examined the patient and met at bedside to discuss diagnosis prognosis, GOC, EOL wishes, disposition and options. Patient's wife William Mann is present at bedside  I introduced Palliative Medicine as specialized medical care for people living with serious illness. It focuses on providing relief from the symptoms and stress of a serious illness.   Advanced directives were considered and reviewed. Patient does have a living will and HCPOA; I have placed a copy of these documents in the chart. These documents state "I also direct that life-prolonging procedures be withheld or withdraw if I am in a permanent vegetative state, or have a terminal illness or an incurable condition, and am therefore unable to experience a meaningful life". His advanced directives also state "I want my William Mann to consider the relief of suffering, my personal beliefs, the expense involved and the quality as well as the possible extension of my life in making decisions for me concerning life-prolonging measures".   William Mann  states that her son will be here tomorrow from [facility] and she would like to include him in the discussion. Plan is for a more detailed [place] meeting tomorrow at 11:30.   William Mann was encouraged to call with questions or concerns.   Primary decision maker: William Mann (wife and HCPOA).   Plan of care discussed with Dr. Leonie Man and  Estill Bamberg SLP.   SUMMARY OF RECOMMENDATIONS   - Advanced directives reviewed and placed on chart - continue full scope of treatment for now - plan for full GOC discussion tomorrow at 11:30 with patient's wife and son  Code Status/Advance Care Planning:  Full code  Palliative Prophylaxis:   Aspiration, Frequent Pain Assessment and Turn Reposition  Prognosis:   Unable to determine  Discharge Planning: To Be Determined      Primary Diagnoses: Present on Admission: . CVA (cerebral vascular accident) ([place])   I have reviewed the medical record, interviewed the patient and family, and examined the patient. The following aspects are pertinent.  Past Medical History:  Diagnosis Date  . Aortic atherosclerosis ([place])   . Bilateral hearing loss   . BPH (benign prostatic hyperplasia)   . Cancer ([place])    basal cell carcinoma  . Carotid artery occlusion   .

## 2019-06-04 NOTE — Progress Notes (Signed)
  Speech Language Pathology Treatment: Dysphagia  Patient Details Name: William Mann MRN: [id number] DOB: [birth year] Today's Date: 06/04/2019 Time: [id number] SLP Time Calculation (min) (ACUTE ONLY): 23 min  Assessment / Plan / Recommendation Clinical Impression  Pt seated in recliner; wife present.  Provided oral care and therapeutic trials of ice chips. Pt with copious secretions spilling from right oral cavity.  Required verbal/tactile cues to attend to right mouth due to sensory deficits; showed pt how to use oral suctioning.  Pt consumed single ice chips with cues to masticate and swallow with intention and extra effort, followed by cue to throat-clear and re-swallow. Pt required verbal cues for each step of the process. He completed ten trials with constant cues as described above. He continues to demonstrate s/s of a significant dysphagia as well as dysarthria due to bilateral nature of neuropathology. Provided education to his wife, who needs repetition of facts in order to help her process new and overwhelming information.  SLP will follow acutely.  William Mann is not ready for a repeat MBS - when clinical improvements are demonstrated, will repeat study (either in acute care or CIR).    HPI HPI: William Mann is a 86 y.o. male with medical history significant of thyroid nodule, emphysema, GERD, right carotid endarterectomy,PVD, hypertension, hyperlipidemia, lower extremity DVT, bilateral hearing loss, presented with new onset of slurred speech and facial droop. Per chart wife also noticed significant right leg weakness, right-sided facial droop, and cough/choking after eating drink since last night. MRI shows small acute infarcts in the left basal ganglia/corona radiata and right thalamus. Pt with increasing lethargy prompting repeat MRI on 4/27 - this revealed extension of the left basal ganglia/radiating white matter tract infarction which is larger than on the initial  presentation, measuring up to 2 cm in size presently.       SLP Plan  Continue with current plan of care       Recommendations  Diet recommendations: NPO                Oral Care Recommendations: Oral care QID Follow up Recommendations: Inpatient Rehab SLP Visit Diagnosis: Dysphagia, oropharyngeal phase (R13.12) Plan: Continue with current plan of care       GO                William Mann 06/04/2019, 10:50 AM  William Mann, [place] Office number [phone] Pager [phone]

## 2019-06-04 NOTE — Progress Notes (Signed)
Inpatient Rehab Admissions Coordinator:  Saw pt at bedside. Wife present as well. Pt participating in ST dysphagia tx.  Provided wife with details regarding CIR. Wife is questioning whether pt should go to CIR versus [facility]. Per hospitalist note, pt is more alert but continues to have fluctuations in mentation and tires easily.  Pt is not medically ready for CIR admission today and there are no beds available in CIR today.  A colleague will f/u regarding potential admission to CIR at later date.  Gayland Curry, [place], [place] Admissions Coordinator [phone]

## 2019-06-04 NOTE — Progress Notes (Signed)
Physical Therapy Treatment Patient Details Name: William Mann MRN: [id number] DOB: September 28, 1929 Today's Date: 06/04/2019    History of Present Illness [name] is a 86 y.o. male with history of  R ICA stenosis s/p R CEA, PVD, hypertension, hyperlipidemia, lower extremity DVT on Eliquis, carotid artery occlusion, atherosclerosis and bilateral hearing loss presenting with dysarthria, facial droop, R leg weakness.  Found to have L BG/CR and R PLIC/thalamic infarcts. Repeat CT 4/26 and MRI 4/27 revealed extension of the left basal ganglia/radiating white matter tract infarction.     PT Comments    Patient awake and in bed upon arrival and agreeable to participating in therapy. Pt continues to present with R side weakness and requires min A for bed mobility and mod A +2 for sit to stand transfers. Stedy standing frame utilized for safety with OOB transfer to recliner. Pt participatory and follows commands consistently with increased time.  PT will continue to follow and progress as tolerated.    Follow Up Recommendations  CIR;Supervision/Assistance - 24 hour     Equipment Recommendations  None recommended by PT(defer to post acute)    Recommendations for Other Services       Precautions / Restrictions Precautions Precautions: Fall Restrictions Weight Bearing Restrictions: No    Mobility  Bed Mobility Overal bed mobility: Needs Assistance Bed Mobility: Supine to Sit     Supine to sit: Min assist     General bed mobility comments: cues for sequencing; assist to bring hips to EOB and to elevate trunk into sitting   Transfers Overall transfer level: Needs assistance   Transfers: Sit to/from Stand Sit to Stand: Mod assist;+2 physical assistance         General transfer comment: assist to support R UE and to power up into standing; multimdal cues for midline posture   Ambulation/Gait                 Stairs             Wheelchair  Mobility    Modified Rankin (Stroke Patients Only) Modified Rankin (Stroke Patients Only) Pre-Morbid Rankin Score: Moderate disability Modified Rankin: Severe disability     Balance Overall balance assessment: Needs assistance Sitting-balance support: Feet supported;Bilateral upper extremity supported;Single extremity supported Sitting balance-Leahy Scale: Poor Sitting balance - Comments: pt able to maintain sitting balance with min guard for safety and cues for righting posture to midline Postural control: Right lateral lean Standing balance support: Bilateral upper extremity supported Standing balance-Leahy Scale: Poor                              Cognition Arousal/Alertness: Awake/alert Behavior During Therapy: Flat affect Overall Cognitive Status: Impaired/Different from baseline                                 General Comments: pt alert and particapatory this am; follows commands consistently with increased time and verbalizing       Exercises      General Comments        Pertinent Vitals/Pain Pain Assessment: No/denies pain Faces Pain Scale: No hurt    Home Living                      Prior Function            PT Goals (current goals can  now be found in the care plan section) Progress towards PT goals: Progressing toward goals    Frequency    Min 4X/week      PT Plan Current plan remains appropriate    Co-evaluation              AM-PAC PT "6 Clicks" Mobility   Outcome Measure  Help needed turning from your back to your side while in a flat bed without using bedrails?: A Little Help needed moving from lying on your back to sitting on the side of a flat bed without using bedrails?: A Little Help needed moving to and from a bed to a chair (including a wheelchair)?: A Lot Help needed standing up from a chair using your arms (e.g., wheelchair or bedside chair)?: A Lot Help needed to walk in hospital room?:  Total Help needed climbing 3-5 steps with a railing? : Total 6 Click Score: 12    End of Session Equipment Utilized During Treatment: Gait belt Activity Tolerance: Patient tolerated treatment well Patient left: with call bell/phone within reach;in chair;with chair alarm set Nurse Communication: Mobility status PT Visit Diagnosis: Other abnormalities of gait and mobility (R26.89);Muscle weakness (generalized) (M62.81);Other symptoms and signs involving the nervous system (R29.898)     Time: [id number] PT Time Calculation (min) (ACUTE ONLY): 38 min  Charges:  $Gait Training: 8-22 mins $Therapeutic Activity: 8-22 mins                     Earney Navy, PTA Acute Rehabilitation Services Pager: [phone] Office: [phone]     Darliss Cheney 06/04/2019, 11:15 AM

## 2019-06-04 NOTE — Progress Notes (Signed)
PROGRESS NOTE    William Mann  [id number] DOB: 12-31-1929 DOA: 05/28/2019 PCP: Patient, No Pcp Per    Brief Narrative:  86 year old gentleman with history of peripheral vascular disease, hypertension, hyperlipidemia, DVT on Eliquis, permanent pacemaker, carotid stenosis status post right carotid endarterectomy, COVID-19 infection in January 2021 presented to the emergency room with slurred speech and facial droop as well as recurrent fall.  Initially presented to the emergency room on 4/20 with fall, negative neuro exam and was sent home.  Subsequently developed slurred speech, unsteady gait, right leg weakness right facial droop and coughing and choking after eating so brought back to the ER.  An MRI showed small acute infarct in the left basal ganglia/corona radiata and right thalamus.  Patient remains awake, lethargic, possibly has more strokes.   Assessment & Plan:   Active Problems:   Stroke (cerebrum) (HCC)   CVA (cerebral vascular accident) ([facility])   Coronary artery disease involving native coronary artery of native heart without angina pectoris   Deep venous thrombosis (HCC)   Fatty liver   Dementia without behavioral disturbance (HCC)   Tachycardia   Hypertension   Leukocytosis  Acute bilateral ischemic stroke: Left basal ganglia stroke, right thalamic infarct secondary to large vessel disease: Clinical findings, right facial droop, right hemiplegia and dysphagia CT head findings, initially negative.  Follow-up CT scan with new periventricular infarct MRI of the brain, left basal ganglia and right thalamic infarcts.  Brain atrophy. Repeat MRI, 4/27, slight increase in size of left basal ganglia stroke, evaluation of right thalamic infarcts. CT of the head and neck, No large vessel occlusion.  Left ICA plaque with 50% stenosis.  Right CEA patent.  Bilateral PCA moderate to severe stenosis. 2D echocardiogram, EF 55%.  Grade 1 diastolic dysfunction.  No  emboli. Antiplatelet therapy, on aspirin 81 mg and Eliquis 5 mg twice a day before event, continued LDL, 160 statin, Zocor increased from 10-40. Hemoglobin A1c, 5.  No indication for treatment. Therapy recommendations, acute inpatient rehab.  Dysphagia: Due to acute stroke.  Maintain on core track feeding.   Intracranial stenosis: Medical management.  Avoid low blood pressure.  Palliative care consulted to discuss, coordinate and counseled patient and family.  If patient is not able to have meaningful swallowing, will need feeding tube in the future.  DVT prophylaxis: Eliquis Code Status: Full code Family Communication: No family present today.  Discussed with wife and son at the bedside last night. Disposition Plan: Status is: Inpatient  Remains inpatient appropriate because:Inpatient level of care appropriate due to severity of illness   Dispo: The patient is from: Home              Anticipated d/c is to: CIR              Anticipated d/c date is: 1 day.  When bed available              Patient currently medically stable to transfer to acute inpatient rehab level of care.          Consultants:   Neurology  Procedures:   None  Antimicrobials:   None   Subjective: Patient seen and examined.  Since yesterday he has been more awake.  He is able to use his right hand more.  He was able to lift it against gravity.  His mentation does fluctuate and gets tired easily. He was asking me that he is hungry and wanting to eat or drink.  Objective: Vitals:  06/03/19 2001 06/04/19 0000 06/04/19 0400 06/04/19 0832  BP: 132/62 118/69 119/83 112/76  Pulse: (!) 108 98 100 (!) 107  Resp: 19   16  Temp: 98.3 F (36.8 C) (!) 97.5 F (36.4 C) 98.4 F (36.9 C) 97.7 F (36.5 C)  TempSrc: Oral Axillary Axillary Axillary  SpO2: 95% 95% 95% 97%    Intake/Output Summary (Last 24 hours) at 06/04/2019 1057 Last data filed at 06/04/2019 0900 Gross per 24 hour  Intake 0 ml  Output  --  Net 0 ml   There were no vitals filed for this visit.  Examination:  General exam: Appears calm and comfortable, sick looking.  He is awake and working with PT OT today. Respiratory system: Clear to auscultation. Respiratory effort normal.  No added sound.  Pacemaker in place. Cardiovascular system: S1 & S2 heard, RRR.  Pacemaker present.   Gastrointestinal system: Abdomen is nondistended, soft and nontender.  Bowel sounds present.   Central nervous system:  Left upper and lower extremities normal. Right mild facial droop. Dysphonia, difficulty understanding words. Right upper extremity 4/5 Right lower extremity 4/5 Psychiatry: Judgement and insight appear compromised. Mood & affect flat.    Data Reviewed: I have personally reviewed following labs and imaging studies  CBC: Recent Labs  Lab 05/28/19 1154 05/28/19 1216 05/31/19 0321 06/01/19 0919 06/02/19 0351 06/03/19 0243 06/04/19 0453  WBC 9.1   < > 13.3* 11.8* 15.1* 14.5* 10.7*  NEUTROABS 7.3  --  10.8*  --   --   --   --   HGB 15.2   < > 15.5 14.2 15.5 13.8 13.7  HCT 45.9   < > 44.7 41.9 44.5 41.8 41.3  MCV 104.1*   < > 98.7 100.5* 99.8 101.5* 103.0*  PLT 174   < > 180 174 198 181 194   < > = values in this interval not displayed.   Basic Metabolic Panel: Recent Labs  Lab 05/29/19 1147 05/29/19 1537 06/01/19 0919 06/02/19 0351 06/02/19 1530 06/03/19 0243 06/04/19 0453  NA 140   < > 140 138 138 134* 141  K 3.9   < > 3.2* 3.8 3.8 4.1 4.0  CL 107   < > 105 105 102 101 103  CO2 16*   < > 27 25 26 26 29   GLUCOSE 104*   < > 104* 134* 134* 125* 127*  BUN 14   < > 24* 23 25* 30* 34*  CREATININE 0.75   < > 0.70 0.65 0.65 0.82 0.80  CALCIUM 8.5*   < > 8.6* 8.7* 8.8* 8.6* 8.7*  MG 1.9  --  2.1  --   --   --   --   PHOS  --   --  2.7  --   --   --   --    < > = values in this interval not displayed.   GFR: Estimated Creatinine Clearance: 61.4 mL/min (by C-G formula based on SCr of 0.8 mg/dL). Liver Function  Tests: Recent Labs  Lab 05/28/19 1154 05/31/19 0321 06/02/19 1530  AST 32 53* 149*  ALT 25 26 170*  ALKPHOS 74 90 136*  BILITOT 1.4* 2.0* 1.7*  PROT 6.1* 6.0* 5.8*  ALBUMIN 3.4* 3.1* 2.7*   No results for input(s): LIPASE, AMYLASE in the last 168 hours. No results for input(s): AMMONIA in the last 168 hours. Coagulation Profile: Recent Labs  Lab 05/28/19 1154  INR 1.1   Cardiac Enzymes: No results for input(s): CKTOTAL, CKMB,  CKMBINDEX, TROPONINI in the last 168 hours. BNP (last 3 results) No results for input(s): PROBNP in the last 8760 hours. HbA1C: No results for input(s): HGBA1C in the last 72 hours. CBG: Recent Labs  Lab 06/03/19 1813 06/03/19 1958 06/04/19 0054 06/04/19 0430 06/04/19 0831  GLUCAP 94 136* 100* 113* 140*   Lipid Profile: No results for input(s): CHOL, HDL, LDLCALC, TRIG, CHOLHDL, LDLDIRECT in the last 72 hours. Thyroid Function Tests: No results for input(s): TSH, T4TOTAL, FREET4, T3FREE, THYROIDAB in the last 72 hours. Anemia Panel: No results for input(s): VITAMINB12, FOLATE, FERRITIN, TIBC, IRON, RETICCTPCT in the last 72 hours. Sepsis Labs: Recent Labs  Lab 05/29/19 1500 05/29/19 1511 05/29/19 1807 05/30/19 0707 06/02/19 1530 06/02/19 1629  PROCALCITON 0.39  --   --  0.11 0.16  --   LATICACIDVEN  --  1.8 2.3*  --  1.7 2.0*    Recent Results (from the past 240 hour(s))  SARS CORONAVIRUS 2 (TAT 6-24 HRS) Nasopharyngeal Nasopharyngeal Swab     Status: None   Collection Time: 05/28/19  5:10 PM   Specimen: Nasopharyngeal Swab  Result Value Ref Range Status   SARS Coronavirus 2 NEGATIVE NEGATIVE Final    Comment: (NOTE) SARS-CoV-2 target nucleic acids are NOT DETECTED. The SARS-CoV-2 RNA is generally detectable in upper and lower respiratory specimens during the acute phase of infection. Negative results do not preclude SARS-CoV-2 infection, do not rule out co-infections with other pathogens, and should not be used as the sole  basis for treatment or other patient management decisions. Negative results must be combined with clinical observations, patient history, and epidemiological information. The expected result is Negative. Fact Sheet for Patients: SugarRoll.be Fact Sheet for Healthcare Providers: https://www.woods-mathews.com/ This test is not yet approved or cleared by the [place] FDA and  has been authorized for detection and/or diagnosis of SARS-CoV-2 by FDA under an Emergency Use Authorization (EUA). This EUA will remain  in effect (meaning this test can be used) for the duration of the COVID-19 declaration under Section 56 4(b)(1) of the Act, 21 U.S.C. section 360bbb-3(b)(1), unless the authorization is terminated or revoked sooner. Performed at [facility], [place] [address], [place], [place] [address]   Culture, blood (x 2)     Status: None (Preliminary result)   Collection Time: 06/02/19  3:30 PM   Specimen: BLOOD LEFT HAND  Result Value Ref Range Status   Specimen Description BLOOD LEFT HAND  Final   Special Requests   Final    BOTTLES DRAWN AEROBIC AND ANAEROBIC Blood Culture adequate volume   Culture   Final    NO GROWTH 2 DAYS Performed at [facility], [place] [address] [place], [place] [address]    Report Status PENDING  Incomplete  Culture, blood (x 2)     Status: None (Preliminary result)   Collection Time: 06/02/19  3:30 PM   Specimen: BLOOD RIGHT HAND  Result Value Ref Range Status   Specimen Description BLOOD RIGHT HAND  Final   Special Requests   Final    BOTTLES DRAWN AEROBIC AND ANAEROBIC Blood Culture results may not be optimal due to an inadequate volume of blood received in culture bottles   Culture   Final    NO GROWTH 2 DAYS Performed at [facility], [place] [address], [place], [place] [address]    Report Status PENDING  Incomplete  Urine culture     Status: None (Preliminary result)   Collection Time:  06/03/19  1:39 PM   Specimen: Urine, Catheterized  Result Value Ref Range Status   Specimen Description URINE, CATHETERIZED  Final   Special Requests NONE  Final   Culture   Final    CULTURE REINCUBATED FOR BETTER GROWTH Performed at [facility], [address], [place], [place] [address]    Report Status PENDING  Incomplete         Radiology Studies: CT HEAD WO CONTRAST  Result Date: 06/02/2019 CLINICAL DATA:  Altered mental status. Possible cephalopathy. EXAM: CT HEAD WITHOUT CONTRAST TECHNIQUE: Contiguous axial images were obtained from the base of the skull through the vertex without intravenous contrast. COMPARISON:  May 28, 2019. FINDINGS: Brain: Mild diffuse cortical atrophy is noted. Mild chronic ischemic white matter disease is noted. Ventricular size is within normal limits. New low density is noted in left periventricular white matter measuring 2.0 x 1.3 cm concerning for acute infarction. Old lacunar infarction is noted more anteriorly which is unchanged compared to prior exam. No hemorrhage or mass lesion is noted. Vascular: No hyperdense vessel or unexpected calcification. Skull: Normal. Negative for fracture or focal lesion. Sinuses/Orbits: No acute finding. Other: None. IMPRESSION: New low density is noted in left periventricular white matter concerning for acute infarction. MRI may be performed for further evaluation. Electronically Signed   By: Marijo Conception M.D.   On: 06/02/2019 16:17   MR BRAIN WO CONTRAST  Result Date: 06/03/2019 CLINICAL DATA:  Follow-up left periventricular white matter stroke. Small acute infarctions in the left basal ganglia and right thalamus. EXAM: MRI HEAD WITHOUT CONTRAST TECHNIQUE: Multiplanar, multiecho pulse sequences of the brain and surrounding structures were obtained without intravenous contrast. COMPARISON:  Head CT yesterday.  MRI 05/28/2019 FINDINGS: Brain: There is expected evolutionary change within the right lateral thalamic  infarction which is losing its restricted diffusion. No evidence of progression or extension of that infarction. There has been progression of acute infarction within the left basal ganglia and radiating white matter tracts compared to the study of 6 days ago. Evidence of hemorrhage or mass effect. No other acute infarction. No focal abnormality affects the brainstem. There is cerebellar atrophy. Cerebral hemispheres show generalized atrophy with chronic small vessel disease of the white matter and old infarctions in the left basal ganglia region. No large vessel territory infarction. Ex vacuo enlargement of the left lateral ventricle. No obstructive hydrocephalus. No extra-axial collection. Vascular: Major vessels at the base of the brain show flow. Skull and upper cervical spine: Negative Sinuses/Orbits: Clear/normal Other: None IMPRESSION: Extension of the left basal ganglia/radiating white matter tract infarction which is larger than on the initial presentation, measuring up to 2 cm in size presently. No sign of hemorrhage or mass effect. Expected evolutionary changes in the small right lateral thalamic acute infarction without progression. Extensive chronic small-vessel ischemic changes elsewhere throughout the deep brain as seen previously. Generalized brain atrophy. Electronically Signed   By: Nelson Chimes M.D.   On: 06/03/2019 17:36   DG CHEST PORT 1 VIEW  Result Date: 06/02/2019 CLINICAL DATA:  Shortness of breath EXAM: PORTABLE CHEST 1 VIEW COMPARISON:  05/29/2019 FINDINGS: Single frontal view of the chest demonstrates enteric catheter passing below diaphragm tip overlying gastric antrum. Dual lead pacemaker overlying left chest unchanged. Cardiac silhouette is enlarged but stable. Mild atherosclerosis aortic arch unchanged. No airspace disease, effusion, or pneumothorax. Stable scarring at the left base. No acute bony abnormalities. IMPRESSION: 1. Enteric catheter as above. 2. No acute intrathoracic  process.  Electronically Signed   By: Randa Ngo M.D.   On: 06/02/2019 15:41   EEG adult  Result Date: 06/03/2019 Lora Havens, MD     06/03/2019  8:15 AM Patient Name: Harmon Deighan MRN: [id number] Epilepsy Attending: Lora Havens Referring Physician/Provider: Dr. Berle Mull Date: 06/02/2019 Duration: 24.58 minutes Patient history: 86 year old male with acute stroke and continued waxing and waning mentation.  EEG to evaluate for seizures. Level of alertness: Awake, asleep AEDs during EEG study: None Technical aspects: This EEG study was done with scalp electrodes positioned according to the 10-20 International system of electrode placement. Electrical activity was acquired at a sampling rate of 500Hz  and reviewed with a high frequency filter of 70Hz  and a low frequency filter of 1Hz . EEG data were recorded continuously and digitally stored. Description: During awake state, no clear posterior dominant was seen.  Sleep was characterized by sleep spindles (12 to 14 Hz), maximal frontocentral region.  EEG showed continuous generalized polymorphic 3 to 6 Hz theta-delta slowing. Hyperventilation and photic stimulation were not performed. Abnormality -Continuous slow, generalized IMPRESSION: This study is suggestive of moderate diffuse encephalopathy, nonspecific etiology. No seizures or epileptiform discharges were seen throughout the recording. Priyanka Barbra Sarks        Scheduled Meds: . apixaban  5 mg Per Tube BID  . aspirin  81 mg Per Tube Daily  . doxazosin  1 mg Per Tube Daily  . feeding supplement (PRO-STAT SUGAR FREE 64)  30 mL Per Tube Daily  . free water  110 mL Per Tube Q4H  . multivitamin  15 mL Per Tube Daily  . simvastatin  40 mg Per Tube q1800  . sodium chloride  1 spray Each Nare QID   Continuous Infusions: . feeding supplement (OSMOLITE 1.2 CAL) 1,000 mL (06/03/19 0238)     LOS: 7 days    Time spent: 30 minutes    Barb Merino, MD [facility] Pager  [phone]

## 2019-06-04 NOTE — Social Work (Signed)
Pt was not appropriate for sbirt assessment.   Emeterio Reeve, Latanya Presser, [place] Social Worker [phone]

## 2019-06-04 NOTE — Progress Notes (Signed)
Pt pulled cortrak out pass positon and blood appearance noted in tube. TF paused at this time. Opyd informed.

## 2019-06-05 LAB — BASIC METABOLIC PANEL
Anion gap: 9 (ref 5–15)
BUN: 31 mg/dL — ABNORMAL HIGH (ref 8–23)
CO2: 27 mmol/L (ref 22–32)
Calcium: 8.5 mg/dL — ABNORMAL LOW (ref 8.9–10.3)
Chloride: 105 mmol/L (ref 98–111)
Creatinine, Ser: 0.87 mg/dL (ref 0.61–1.24)
GFR calc Af Amer: >60 mL/min (ref >60)
GFR calc non Af Amer: >60 mL/min (ref >60)
Glucose, Bld: 105 mg/dL — ABNORMAL HIGH (ref 70–99)
Potassium: 3.9 mmol/L (ref 3.5–5.1)
Sodium: 141 mmol/L (ref 135–145)

## 2019-06-05 LAB — TYPE AND SCREEN
ABO/RH(D): O POS
Antibody Screen: NEGATIVE

## 2019-06-05 LAB — GLUCOSE, CAPILLARY
Glucose-Capillary: 94 mg/dL (ref 70–99)
Glucose-Capillary: 113 mg/dL — ABNORMAL HIGH (ref 70–99)
Glucose-Capillary: 94 mg/dL (ref 70–99)
Glucose-Capillary: 98 mg/dL (ref 70–99)
Glucose-Capillary: 119 mg/dL — ABNORMAL HIGH (ref 70–99)
Glucose-Capillary: 98 mg/dL (ref 70–99)

## 2019-06-05 LAB — CBC
HCT: 42.5 % (ref 39.0–52.0)
Hemoglobin: 14.5 g/dL (ref 13.0–17.0)
MCH: 34.8 pg — ABNORMAL HIGH (ref 26.0–34.0)
MCHC: 34.1 g/dL (ref 30.0–36.0)
MCV: 101.9 fL — ABNORMAL HIGH (ref 80.0–100.0)
Platelets: 227 10*3/uL (ref 150–400)
RBC: 4.17 MIL/uL — ABNORMAL LOW (ref 4.22–5.81)
RDW: 13.4 % (ref 11.5–15.5)
WBC: 11.4 10*3/uL — ABNORMAL HIGH (ref 4.0–10.5)
nRBC: 0 % (ref 0.0–0.2)

## 2019-06-05 LAB — URINE CULTURE: Culture: >=100000 — AB

## 2019-06-05 MED ORDER — HALOPERIDOL LACTATE 5 MG/ML IJ SOLN
5.0000 mg | Freq: Once | INTRAMUSCULAR | Status: AC
  Administered 2019-06-06: 5 mg via INTRAVENOUS
  Filled 2019-06-05: qty 1

## 2019-06-05 NOTE — Progress Notes (Signed)
V.O. to pull cortrak at this time d/t not being seen on chest xray.

## 2019-06-05 NOTE — Progress Notes (Signed)
Goal of care discussion,  I came back to see patient in the afternoon along with patient's wife at the bedside and their son Mr. Remo Lipps.  Patient's wife is forgetful, however she was able to participate somehow.  Patient is in a different mood now.  He is sitting in a couch.  He was able to follow all commands and able to swab his mouth with his left hand.  He even was joking and telling his son to lay in his bed because he is not rested with traveling.  I discussed with patient about ongoing issues including stroke and his inability to swallow.  I told him that it will take another 7 to 10 days to know whether he will be able to safely swallow or not.  I asked him until then what he wants to do about nutrition as he is not able to take by mouth.  He was well aware about the tube in his nose, he says it is okay to put it back in and he will work with rehab.  Plan: Core track tube to be placed again.  Start enteral tube feeding.  Stable to go to CIR with core track tube. I suggested patient's family to keep open communication with palliative care team, in case he does not do well after rehab or it comes to the point of needing artificial feeding like PEG tube placement, we will need palliative care guidance. Family agrees that given his age, possibility of more stroke, they will be able to keep up with palliative conversation. I will provide a copy of MOST from to the family to educate and learn about different level of care.

## 2019-06-05 NOTE — Progress Notes (Signed)
Pt's tube cleaned by CN and it appears to be stained versus blood. Opyd informed. Also asked MD if adjusting the tube would be apart of care plan at this time.

## 2019-06-05 NOTE — Plan of Care (Signed)

## 2019-06-05 NOTE — Progress Notes (Signed)
TF on hold per Opyd, MD at this time.

## 2019-06-05 NOTE — Progress Notes (Signed)
Nutrition Follow-up  DOCUMENTATION CODES:   Severe malnutrition in context of acute illness/injury  INTERVENTION:  Monitor for decision regarding comfort care vs continuation of tube feeding.   If plan is to continue with tube feeding, recommend the following once Cortrak has been placed: Osmolite 1.2 cal @ 35m/hr (14430m via Cortrak   3043mro-stat BID via Cortrak  125m55mee water Q4H via Cortrak   At goal, tube feeding will provide 1928 kcal, 110 grams protein, 1180ml24me water (1930ml 32m free water flushes)  NUTRITION DIAGNOSIS:   Severe Malnutrition related to acute illness(CVA) as evidenced by moderate fat depletion, moderate muscle depletion.  Ongoing.  GOAL:   Patient will meet greater than or equal to 90% of their needs  Met with TF.   MONITOR:   Labs, Weight trends, TF tolerance, I & O's  REASON FOR ASSESSMENT:   Consult Enteral/tube feeding initiation and management  ASSESSMENT:   Pt with a PMH significant for PVD, HTN, HLD, DVT, PPM implant, carotid stenosis s/p right carotid endarterectomy, COVID-19 02/2019. Pt admitted with bilateral CVA.  4/22 - failed BSE 4/23 - failed MBS, Cortrak placed (tip in stomach)  4/27 - MRI revealed extension of L BG stroke  Pt sleeping at time of RD visit, but pt's wife and son at bedside. Family expressed concerns for lack of nutrition today. Provided comfort and discussed potential options. Addressed family's nutrition questions/concerns.  Palliative Care following. GOC meHarrisonng at 1130 today with family.   Discussed pt with RN. Pt pulled Cortrak at 0400 this morning. Cortrak team not available to replace until tomorrow. Per RN, plan is to await the family's decision regarding GOC [place] they elect to continue with full care, Cortrak will be replaced tomorrow and tube feeding will be restarted.   Tube feeding orders:30ml P23mtat daily, 110ml fr50mater Q4H, Osmolite 1.2 cal @ 60ml/hr 26m: 600ml x24 84mrs I/O: -36ml since58mit  Medications reviewed and include: MVI Labs: CBGs 94-137  NUTRITION - FOCUSED PHYSICAL EXAM:    Most Recent Value  Orbital Region  No depletion  Upper Arm Region  Moderate depletion  Thoracic and Lumbar Region  Moderate depletion  Buccal Region  Moderate depletion  Temple Region  Severe depletion  Clavicle Bone Region  Moderate depletion  Clavicle and Acromion Bone Region  Moderate depletion  Scapular Bone Region  Moderate depletion  Dorsal Hand  Moderate depletion  Patellar Region  Moderate depletion  Anterior Thigh Region  Moderate depletion  Posterior Calf Region  Moderate depletion  Edema (RD Assessment)  None  Hair  Reviewed  Eyes  Reviewed  Mouth  Reviewed  Skin  Reviewed  Nails  Reviewed       Diet Order:   Diet Order            Diet NPO time specified  Diet effective now              EDUCATION NEEDS:   No education needs have been identified at this time  Skin:  Skin Assessment: Reviewed RN Assessment  Last BM:  4/24  Height:   Ht Readings from Last 1 Encounters:  05/27/19 _0  (1.727 m)    Weight:   Wt Readings from Last 1 Encounters:  06/05/19 68.3 kg    BMI:  Body mass index is 22.89 kg/m.  Estimated Nutritional Needs:   Kcal:  1900-2100  Protein:  95-110 grams  Fluid:  >1.9L/d    Kariss Longmire AverLarkin InaDN RD  pager number and weekend/on-call pager number located in [place].

## 2019-06-05 NOTE — Progress Notes (Signed)
Pt's cortrak pulled at 0400, new order to have Cortrak team paged at 0800 to replace cortrak. On-coming nurse to be updated.

## 2019-06-05 NOTE — Progress Notes (Signed)
Inpatient Rehabilitation Admissions Coordinator  I met with patient's spouse and son at bedside. I discussed that I will have our Rehab MD see him tomorrow to assess his candidacy for a possible inpt rehab admit. I will follow up tomorrow.  Danne Baxter, RN, MSN Rehab Admissions Coordinator [phone] 06/05/2019 3:42 PM

## 2019-06-05 NOTE — Progress Notes (Signed)
PROGRESS NOTE    William Mann  [id number] DOB: December 15, 1929 DOA: 05/28/2019 PCP: Patient, No Pcp Per    Brief Narrative:  86 year old gentleman with history of peripheral vascular disease, hypertension, hyperlipidemia, DVT on Eliquis, permanent pacemaker, carotid stenosis status post right carotid endarterectomy, COVID-19 infection in January 2021 presented to the emergency room with slurred speech and facial droop as well as recurrent fall.  Initially presented to the emergency room on 4/20 with fall, negative neuro exam and was sent home.  Subsequently developed slurred speech, unsteady gait, right leg weakness right facial droop and coughing and choking after eating so brought back to the ER.  An MRI showed small acute infarct in the left basal ganglia/corona radiata and right thalamus.     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 CT head findings, initially negative.  Follow-up CT scan with new periventricular infarct MRI of the brain, left basal ganglia and right thalamic infarcts.  Brain atrophy. Repeat MRI, 4/27, slight increase in size of left basal ganglia stroke, evaluation of right thalamic infarcts. CT of the head and neck, No  large vessel occlusion.  Left ICA plaque with 50% stenosis.  Right CEA patent.  Bilateral PCA moderate to severe stenosis. 2D echocardiogram, EF 55%.  Grade 1 diastolic dysfunction.  No emboli. Antiplatelet therapy, on aspirin 81 mg and Eliquis 5 mg twice a day before event, continued LDL, 160 statin, Zocor increased from 10-40. Hemoglobin A1c, 5.  No indication for treatment. Therapy recommendations, acute inpatient rehab vs SNF  Dysphagia: Due to acute stroke.  Unable to swallow.  Core track tube pulled out last night. Patient with poor clinical recovery and progression. Followed by palliative care. If family agree, he is appropriate for hospice level of care.  Intracranial stenosis: Medical management.  Avoid low blood pressure.  Palliative care consulted to discuss, coordinate and counseled patient and family. Palliative care team meeting with family today. We will hold off on putting back core track tube today until meeting.   DVT prophylaxis: Eliquis Code Status: Full code Family Communication: No family present today. Will meet  Disposition Plan: Status is: Inpatient  Remains inpatient appropriate because:Inpatient level of care appropriate due to severity of illness   Dispo: The patient is from: Home              Anticipated d/c is to: CIR              Anticipated d/c date is: unknown.  No enteral nutrition.              Patient currently not medically stable.   Consultants:   Neurology  Procedures:   None  Antimicrobials:   None   Subjective: Patient seen and examined.  More lethargic today.  Overnight events noted.  Core track fell off, patient was slightly agitated last night. Early morning, he is mostly lethargic, restraints removed and examined.  He was slightly confused and unable to verbalize.  Objective: Vitals:   06/04/19 1957 06/04/19 2340 06/05/19 0349 06/05/19 0746  BP: 103/66 123/78 125/75 120/75  Pulse: 89 91 91 68  Resp: 18 18 18 18   Temp:  98.2 F (36.8 C) 97.8 F (36.6 C) 98 F (36.7 C) 98.9 F (37.2 C)  TempSrc: Oral Oral Oral Axillary  SpO2: 99% 97% 98% 96%  Weight:   68.3 kg     Intake/Output Summary (Last 24 hours) at 06/05/2019 1041 Last data filed at 06/04/2019 2340 Gross per 24 hour  Intake 1788 ml  Output 600 ml  Net 1188 ml   Filed Weights   06/05/19 0349  Weight: 68.3 kg    Examination:  General exam: Appears calm, sick looking.  More lethargic and tired today. Respiratory system: Clear to auscultation. Respiratory effort normal.  No added sound.  Pacemaker in place. Cardiovascular system: S1 & S2 heard, RRR.  Pacemaker present.   Gastrointestinal system: Abdomen is nondistended, soft and nontender.  Bowel sounds present.   Central nervous system:  Left upper and lower extremities normal. Right mild facial droop. Dysphonia, difficulty understanding words. Right upper extremity 3-4/5 Right lower extremity 4/5 Psychiatry: Judgement and insight appear compromised. Mood & affect flat and anxious.    Data Reviewed: I have personally reviewed following labs and imaging studies  CBC: Recent Labs  Lab 05/31/19 0321 05/31/19 0321 06/01/19 0919 06/02/19 0351 06/03/19 0243 06/04/19 0453 06/05/19 0527  WBC 13.3*   < > 11.8* 15.1* 14.5* 10.7* 11.4*  NEUTROABS 10.8*  --   --   --   --   --   --   HGB 15.5   < > 14.2 15.5 13.8 13.7 14.5  HCT 44.7   < > 41.9 44.5 41.8 41.3 42.5  MCV 98.7   < > 100.5* 99.8 101.5* 103.0* 101.9*  PLT 180   < > 174 198 181 194 227   < > = values in this interval not displayed.   Basic Metabolic Panel: Recent Labs  Lab 05/29/19 1147 05/29/19 1537 06/01/19 0919 06/01/19 0919 06/02/19 0351 06/02/19 1530 06/03/19 0243 06/04/19 0453 06/05/19 0527  NA 140   < > 140   < > 138 138 134* 141 141  K 3.9   < > 3.2*   < > 3.8 3.8 4.1 4.0 3.9  CL 107   < > 105   < > 105 102 101 103 105  CO2 16*   < > 27   < > 25 26 26 29 27   GLUCOSE 104*   < > 104*   < > 134* 134* 125*  127* 105*  BUN 14   < > 24*   < > 23 25* 30* 34* 31*  CREATININE 0.75   < > 0.70   < > 0.65 0.65 0.82 0.80 0.87  CALCIUM 8.5*   < > 8.6*   < > 8.7* 8.8* 8.6* 8.7* 8.5*  MG 1.9  --  2.1  --   --   --   --   --   --   PHOS  --   --  2.7  --   --   --   --   --   --    < > = values in this interval not displayed.   GFR: Estimated Creatinine Clearance: 56.7  mL/min (by C-G formula based on SCr of 0.87 mg/dL). Liver Function Tests: Recent Labs  Lab 05/31/19 0321 06/02/19 1530  AST 53* 149*  ALT 26 170*  ALKPHOS 90 136*  BILITOT 2.0* 1.7*  PROT 6.0* 5.8*  ALBUMIN 3.1* 2.7*   No results for input(s): LIPASE, AMYLASE in the last 168 hours. No results for input(s): AMMONIA in the last 168 hours. Coagulation Profile: No results for input(s): INR, PROTIME in the last 168 hours. Cardiac Enzymes: No results for input(s): CKTOTAL, CKMB, CKMBINDEX, TROPONINI in the last 168 hours. BNP (last 3 results) No results for input(s): PROBNP in the last 8760 hours. HbA1C: No results for input(s): HGBA1C in the last 72 hours. CBG: Recent Labs  Lab 06/04/19 1537 06/04/19 1956 06/04/19 2337 06/05/19 0348 06/05/19 0740  GLUCAP 114* 109* 137* 94 113*   Lipid Profile: No results for input(s): CHOL, HDL, LDLCALC, TRIG, CHOLHDL, LDLDIRECT in the last 72 hours. Thyroid Function Tests: No results for input(s): TSH, T4TOTAL, FREET4, T3FREE, THYROIDAB in the last 72 hours. Anemia Panel: No results for input(s): VITAMINB12, FOLATE, FERRITIN, TIBC, IRON, RETICCTPCT in the last 72 hours. Sepsis Labs: Recent Labs  Lab 05/29/19 1500 05/29/19 1511 05/29/19 1807 05/30/19 0707 06/02/19 1530 06/02/19 1629  PROCALCITON 0.39  --   --  0.11 0.16  --   LATICACIDVEN  --  1.8 2.3*  --  1.7 2.0*    Recent Results (from the past 240 hour(s))  SARS CORONAVIRUS 2 (TAT 6-24 HRS) Nasopharyngeal Nasopharyngeal Swab     Status: None   Collection Time: 05/28/19  5:10 PM   Specimen: Nasopharyngeal Swab  Result  Value Ref Range Status   SARS Coronavirus 2 NEGATIVE NEGATIVE Final    Comment: (NOTE) SARS-CoV-2 target nucleic acids are NOT DETECTED. The SARS-CoV-2 RNA is generally detectable in upper and lower respiratory specimens during the acute phase of infection. Negative results do not preclude SARS-CoV-2 infection, do not rule out co-infections with other pathogens, and should not be used as the sole basis for treatment or other patient management decisions. Negative results must be combined with clinical observations, patient history, and epidemiological information. The expected result is Negative. Fact Sheet for Patients: SugarRoll.be Fact Sheet for Healthcare Providers: https://www.woods-mathews.com/ This test is not yet approved or cleared by the [place] FDA and  has been authorized for detection and/or diagnosis of SARS-CoV-2 by FDA under an Emergency Use Authorization (EUA). This EUA will remain  in effect (meaning this test can be used) for the duration of the COVID-19 declaration under Section 56 4(b)(1) of the Act, 21 U.S.C. section 360bbb-3(b)(1), unless the authorization is terminated or revoked sooner. Performed at [facility], [place] [address] [place], [place] [address]   Culture, blood (x 2)     Status: None (Preliminary result)   Collection Time: 06/02/19  3:30 PM   Specimen: BLOOD LEFT HAND  Result Value Ref Range Status   Specimen Description BLOOD LEFT HAND  Final   Special Requests   Final    BOTTLES DRAWN AEROBIC AND ANAEROBIC Blood Culture adequate volume   Culture   Final    NO GROWTH 3 DAYS Performed at [facility], [place] [address], [place], [place] [address]    Report Status PENDING  Incomplete  Culture, blood (x 2)     Status: None (Preliminary result)   Collection Time: 06/02/19  3:30 PM   Specimen: BLOOD RIGHT HAND  Result Value Ref Range Status   Specimen  Description BLOOD RIGHT HAND  Final    Special Requests   Final    BOTTLES DRAWN AEROBIC AND ANAEROBIC Blood Culture results may not be optimal due to an inadequate volume of blood received in culture bottles   Culture   Final    NO GROWTH 3 DAYS Performed at [facility], [place] [address], [place], [place] [address]    Report Status PENDING  Incomplete  Urine culture     Status: Abnormal   Collection Time: 06/03/19  1:39 PM   Specimen: Urine, Catheterized  Result Value Ref Range Status   Specimen Description URINE, CATHETERIZED  Final   Special Requests NONE  Final   Culture (A)  Final    >=100,000 COLONIES/mL AEROCOCCUS VIRIDANS Standardized susceptibility testing for this organism is not available. Performed at [facility], [place] [address] [place],  [address]    Report Status 06/05/2019 FINAL  Final         Radiology Studies: MR BRAIN WO CONTRAST  Result Date: 06/03/2019 CLINICAL DATA:  Follow-up left periventricular white matter stroke. Small acute infarctions in the left basal ganglia and right thalamus. EXAM: MRI HEAD WITHOUT CONTRAST TECHNIQUE: Multiplanar, multiecho pulse sequences of the brain and surrounding structures were obtained without intravenous contrast. COMPARISON:  Head CT yesterday.  MRI 05/28/2019 FINDINGS: Brain: There is expected evolutionary change within the right lateral thalamic infarction which is losing its restricted diffusion. No evidence of progression or extension of that infarction. There has been progression of acute infarction within the left basal ganglia and radiating white matter tracts compared to the study of 6 days ago. Evidence of hemorrhage or mass effect. No other acute infarction. No focal abnormality affects the brainstem. There is cerebellar atrophy. Cerebral hemispheres show generalized atrophy with chronic small vessel disease of the white matter and old infarctions in the left basal ganglia region. No large vessel territory infarction. Ex vacuo enlargement  of the left lateral ventricle. No obstructive hydrocephalus. No extra-axial collection. Vascular: Major vessels at the base of the brain show flow. Skull and upper cervical spine: Negative Sinuses/Orbits: Clear/normal Other: None IMPRESSION: Extension of the left basal ganglia/radiating white matter tract infarction which is larger than on the initial presentation, measuring up to 2 cm in size presently. No sign of hemorrhage or mass effect. Expected evolutionary changes in the small right lateral thalamic acute infarction without progression. Extensive chronic small-vessel ischemic changes elsewhere throughout the deep brain as seen previously. Generalized brain atrophy. Electronically Signed   By: Nelson Chimes M.D.   On: 06/03/2019 17:36   DG CHEST PORT 1 VIEW  Result Date: 06/05/2019 CLINICAL DATA:  Complication of feeding tube. EXAM: PORTABLE CHEST 1 VIEW COMPARISON:  June 02, 2019 FINDINGS: There is a dual lead AICD. The nasogastric tube seen on the prior study has been removed. Mild diffuse chronic appearing increased lung markings are seen without evidence of acute infiltrate, pleural effusion or pneumothorax. The cardiac silhouette is mildly enlarged. Multilevel degenerative changes seen throughout the thoracic spine. IMPRESSION: Chronic appearing increased lung markings without evidence of acute or active cardiopulmonary disease. Electronically Signed   By: Virgina Norfolk M.D.   On: 06/05/2019 01:50        Scheduled Meds: . apixaban  5 mg Per Tube BID  . aspirin  81 mg Per Tube Daily  . doxazosin  1 mg Per Tube Daily  . feeding supplement (PRO-STAT SUGAR FREE 64)  30 mL Per Tube Daily  . free  water  110 mL Per Tube Q4H  . multivitamin  15 mL Per Tube Daily  . simvastatin  40 mg Per Tube q1800  . sodium chloride  1 spray Each Nare QID   Continuous Infusions: . feeding supplement (OSMOLITE 1.2 CAL) Stopped (06/05/19 0042)     LOS: 8 days    Time spent: 30 minutes    Barb Merino, MD [facility] Pager [phone]

## 2019-06-05 NOTE — Progress Notes (Signed)
Daily Progress Note   Patient Name: William Mann Current       Date: 06/05/2019 DOB: [birth year]  Age: 86 y.o. MRN#: [id number] Attending Physician: Barb Merino, MD Primary Care Physician: Patient, No Pcp Per Admit Date: 05/28/2019  Reason for Consultation/Follow-up: Establishing goals of care  Subjective: Patient is lying in bed, has just had a full assist bath. Nursing notes indicate that patient pulled out his cortrak last night. Wife Steward Drone and son Richardson Landry are at the bedside.   I met with Steward Drone and Richardson Landry in the conference room on 3W  to discuss diagnosis prognosis, GOC, EOL wishes, disposition and options.  We discussed a brief life review of the patient. He was born and raised in [place]. He has been married to [place] for 77 years, they have 1 son Richardson Landry), and 2 grandchildren. He worked as a Art gallery manager for 50 years, and enjoyed this work as well as the social aspect of his career. 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   As far as functional status, the family describes a decline over the past year or so. Previously he was relatively active and would enjoy working in his yard, but more recently was only ambulating within the home. He spent 4 weeks at a rehab facility at the end of last year after having an arm fracture and pacemaker placement in November 2020. Family also mentions several falls at home over the past year.   We discussed his current illness and what it means in the larger context of his co-morbidities and functional decline.  We discussed natural disease trajectory of a major CVA and how it affects ability to speak, swallow, and ambulate. The family expresses frustration that he was sent home from the ED with similar symtoms the day before he was  admitted with the stroke. They also express frustration over aspects of this hospitalization and what they perceive as delays in care.   The difference between aggressive medical intervention and comfort care was considered in light of the patient's goals of care. We discussed timed trial of temporary feeding tube. Encouraged family to consider what patient's "bottom life" of meaningful recovery would be- they had difficulty verbalizing this.   Advanced directives, concepts specific to code status, artifical feeding and hydration, and rehospitalization were  considered and discussed. I read excerpts from the patient's living will that mentioned "meaningful life". I gave a hard choices book to both Freddy Jaksch.   Length of Stay: 8  Current Medications: Scheduled Meds:  . apixaban  5 mg Per Tube BID  . aspirin  81 mg Per Tube Daily  . doxazosin  1 mg Per Tube Daily  . feeding supplement (PRO-STAT SUGAR FREE 64)  30 mL Per Tube Daily  . free water  110 mL Per Tube Q4H  . multivitamin  15 mL Per Tube Daily  . simvastatin  40 mg Per Tube q1800  . sodium chloride  1 spray Each Nare QID    Continuous Infusions: . feeding supplement (OSMOLITE 1.2 CAL) Stopped (06/05/19 0042)    PRN Meds: acetaminophen **OR** acetaminophen (TYLENOL) oral liquid 160 mg/5 mL **OR** acetaminophen, zolpidem  Physical Exam Vitals reviewed.  Constitutional:      General: He is not in acute distress. HENT:     Head: Normocephalic and atraumatic.  Pulmonary:     Effort: Pulmonary effort is normal.  Skin:    General: Skin is warm and dry.             Vital Signs: BP 111/67 (BP Location: Right Arm)   Pulse 84   Temp 98.2 F (36.8 C) (Oral)   Resp 18   Wt 68.3 kg   SpO2 99%   BMI 22.89 kg/m  SpO2: SpO2: 99 % O2 Device: O2 Device: Room Air O2 Flow Rate:    Intake/output summary:   Intake/Output Summary (Last 24 hours) at 06/05/2019 1400 Last data filed at 06/04/2019 2340 Gross per 24 hour    Intake 1788 ml  Output 600 ml  Net 1188 ml   LBM: Last BM Date: 05/31/19 Baseline Weight: Weight: 68.3 kg Most recent weight: Weight: 68.3 kg       Palliative Assessment/Data: 20%      Patient Active Problem List   Diagnosis Date Noted  . Dysphagia due to recent cerebral infarction   . Hemiplegia as late effect of cerebral infarction ([place])   . Advanced care planning/counseling discussion   . Goals of care, counseling/discussion   . 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 Palliative care by specialist   . Coronary artery disease involving native coronary artery of native heart without angina pectoris   . Deep venous thrombosis ([place])   . Fatty liver   . Dementia without behavioral disturbance ([place])   . Tachycardia   . Hypertension   . Leukocytosis   . Stroke (cerebrum) ([place]) 05/28/2019  . CVA (cerebral vascular accident) ([place]) 05/28/2019  . Pacemaker 03/24/2019  . Complete heart block ([place]) 12/20/2018  . Aftercare following surgery of the circulatory system 02/17/2014  . Bilateral carotid artery occlusion 02/17/2014  . Aftercare following surgery of the circulatory system, [place] 01/17/2013  . Numbness and tingling 02/27/2012  . Peripheral vascular disease, unspecified ([place]) 02/27/2012  . Occlusion and stenosis of carotid artery without mention of cerebral infarction 03/14/2011    Palliative Care Assessment & Plan   Patient Profile: 86 y.o. male  with past medical history of HTN, hyperlipidemia, DVT on Eliquis, CAD, and right carotid stenosis s/p endarderectomy admitted on 05/28/2019 with acute ischemic stroke. Mental status worsened on 4/26 and CT showed new low density on the left concerning for acute infarction. Palliative care has been consulted to discuss [place] in the setting of extension of the CVA and clinical findings of right hemiplegia,  dysarthria, and dysphagia.   Assessment:  After a very lengthy discussion, the family is still struggling with which care path  (comfort versus aggressive versus time trial of feeding tube and therapy) is most aligned with the patient's goals and wishes.   I have asked them to think about what he would consider to be "meaningful life", in the context that he will not be at his former level of functioning even with intensive rehab- will most likely be permanently debilitated and risk of aspiration remains with his significant dysphagia and dysarthria.  Plan to reconvene tomorrow with PMT to continue discussion of goals and code status.  Requested attending providers to also update family regarding prognosis.    Recommendations/Plan:  - continue full scope of treatment for now - will readdress GOC with family tomorrow, including code status   Code Status: - Full code  Prognosis:   Unable to determine  Discharge Planning:  To Be Determined  Care plan was discussed with: wife, son, and Dr. Cheral Almas  Thank you for allowing the Palliative Medicine Team to assist in the care of this patient.   Time In: 11:30 Time Out: 13:00 Total Time 90 minutes Prolonged Time Billed  Yes      Greater than 50%  of this time was spent counseling and coordinating care related to the above assessment and plan.  Lavena Bullion, NP  Mariana Kaufman, AGNP-C Palliative Medicine    Please contact Palliative Medicine Team phone at [phone] for questions and concerns.

## 2019-06-05 NOTE — Progress Notes (Signed)
Physical Therapy Treatment Patient Details Name: William Mann MRN: [id number] DOB: 1929/09/29 Today's Date: 06/05/2019    History of Present Illness [name] is a 86 y.o. male with history of  R ICA stenosis s/p R CEA, PVD, hypertension, hyperlipidemia, lower extremity DVT on Eliquis, carotid artery occlusion, atherosclerosis and bilateral hearing loss presenting with dysarthria, facial droop, R leg weakness.  Found to have L BG/CR and R PLIC/thalamic infarcts. Repeat CT 4/26 and MRI 4/27 revealed extension of the left basal ganglia/radiating white matter tract infarction.    PT Comments    Pt in bed upon arrival of PT, agreeable to PT session with focus on progression of transfer and general LE strengthening. The pt was able to demo improvements in assist needed for bed mobility, but continues to benefit from sig assist to raise from elevated bed surface, requiring modA of 2 with stedy. The pt then completed multiple general LE strengthening exercises to improve functional mobility and functional strength for future transfers and progression to ambulation. The pt will continue to benefit from skilled PT to further progress activity tolerance, stability, and endurance.     Follow Up Recommendations  CIR;Supervision/Assistance - 24 hour     Equipment Recommendations  None recommended by PT(defer to post acute)    Recommendations for Other Services       Precautions / Restrictions Precautions Precautions: Fall Restrictions Weight Bearing Restrictions: No    Mobility  Bed Mobility Overal bed mobility: Needs Assistance Bed Mobility: Supine to Sit     Supine to sit: Mod assist     General bed mobility comments: VC for sequencing, minA to assist scooting pt able to initiate  Transfers Overall transfer level: Needs assistance Equipment used: Ambulation equipment used Transfers: Sit to/from Stand Sit to Stand: Mod assist;+2 physical assistance          General transfer comment: assist to support R UE and to power up into standing; multimdal cues for midline posture   Ambulation/Gait                 Stairs             Wheelchair Mobility    Modified Rankin (Stroke Patients Only) Modified Rankin (Stroke Patients Only) Pre-Morbid Rankin Score: Moderate disability Modified Rankin: Severe disability     Balance Overall balance assessment: Needs assistance Sitting-balance support: Feet supported;Bilateral upper extremity supported;Single extremity supported Sitting balance-Leahy Scale: Poor Sitting balance - Comments: pt able to maintain sitting balance with min guard for safety and cues for righting posture to midline   Standing balance support: Bilateral upper extremity supported Standing balance-Leahy Scale: Poor Standing balance comment: R lateral lean in standing needing +2 assist                            Cognition Arousal/Alertness: Awake/alert Behavior During Therapy: Flat affect Overall Cognitive Status: Impaired/Different from baseline Area of Impairment: Orientation;Following commands;Attention                 Orientation Level: Disoriented to;Place Current Attention Level: Focused   Following Commands: Follows one step commands consistently;Follows multi-step commands with increased time Safety/Judgement: Decreased awareness of deficits;Decreased awareness of safety     General Comments: pt alert and particapatory this am; follows commands consistently with increased time and verbalizing       Exercises General Exercises - Lower Extremity Long Arc Quad: Strengthening;Both;Seated;20 reps;AROM(10 AROM, 10 against resistance of PT)  Heel Slides: Strengthening;AROM;Both;20 reps;Seated(10 AROM, 10 against resistance of PT) Hip ABduction/ADduction: AROM;Strengthening;Both;10 reps;Seated(10 AROM, 10 against resistance of PT) Heel Raises: Strengthening;Both;10 reps;Seated    General  Comments        Pertinent Vitals/Pain Pain Assessment: No/denies pain Pain Intervention(s): Limited activity within patient's tolerance;Monitored during session    Home Living                      Prior Function            PT Goals (current goals can now be found in the care plan section) Acute Rehab PT Goals Patient Stated Goal: to go to rehab PT Goal Formulation: With patient/family Time For Goal Achievement: 06/12/19 Potential to Achieve Goals: Fair Progress towards PT goals: Progressing toward goals    Frequency    Min 4X/week      PT Plan Current plan remains appropriate    Co-evaluation              AM-PAC PT "6 Clicks" Mobility   Outcome Measure  Help needed turning from your back to your side while in a flat bed without using bedrails?: A Little Help needed moving from lying on your back to sitting on the side of a flat bed without using bedrails?: A Little Help needed moving to and from a bed to a chair (including a wheelchair)?: A Lot Help needed standing up from a chair using your arms (e.g., wheelchair or bedside chair)?: A Lot Help needed to walk in hospital room?: Total Help needed climbing 3-5 steps with a railing? : Total 6 Click Score: 12    End of Session Equipment Utilized During Treatment: Gait belt Activity Tolerance: Patient tolerated treatment well Patient left: with call bell/phone within reach;in chair;with chair alarm set Nurse Communication: Mobility status PT Visit Diagnosis: Other abnormalities of gait and mobility (R26.89);Muscle weakness (generalized) (M62.81);Other symptoms and signs involving the nervous system (R29.898)     Time: [id number] PT Time Calculation (min) (ACUTE ONLY): 25 min  Charges:  $Therapeutic Exercise: 8-22 mins $Therapeutic Activity: 8-22 mins                     Karma Ganja, PT, DPT   Acute Rehabilitation Department Pager #: [phone]   Otho Bellows 06/05/2019, 2:03 PM

## 2019-06-06 DIAGNOSIS — E43 Unspecified severe protein-calorie malnutrition: Secondary | ICD-10-CM | POA: Insufficient documentation

## 2019-06-06 DIAGNOSIS — Z66 Do not resuscitate: Secondary | ICD-10-CM

## 2019-06-06 DIAGNOSIS — Z515 Encounter for palliative care: Secondary | ICD-10-CM

## 2019-06-06 LAB — GLUCOSE, CAPILLARY
Glucose-Capillary: 101 mg/dL — ABNORMAL HIGH (ref 70–99)
Glucose-Capillary: 90 mg/dL (ref 70–99)
Glucose-Capillary: 106 mg/dL — ABNORMAL HIGH (ref 70–99)
Glucose-Capillary: 121 mg/dL — ABNORMAL HIGH (ref 70–99)
Glucose-Capillary: 105 mg/dL — ABNORMAL HIGH (ref 70–99)

## 2019-06-06 MED ORDER — PRO-STAT SUGAR FREE PO LIQD
30.0000 mL | Freq: Two times a day (BID) | ORAL | Status: AC
  Administered 2019-06-06 – 2019-06-15 (×19): 30 mL
  Filled 2019-06-06 (×18): qty 30

## 2019-06-06 MED ORDER — FREE WATER
125.0000 mL | Status: DC
  Administered 2019-06-06 – 2019-06-18 (×67): 125 mL

## 2019-06-06 NOTE — Progress Notes (Signed)
PROGRESS NOTE    William Mann  [id number] DOB: 02-26-1929 DOA: 05/28/2019 PCP: Patient, No Pcp Per    Brief Narrative:  86 year old gentleman with history of peripheral vascular disease, hypertension, hyperlipidemia, DVT on Eliquis, permanent pacemaker, carotid stenosis status post right carotid endarterectomy, COVID-19 infection in January 2021 presented to the emergency room with slurred speech and facial droop as well as recurrent fall.  Initially presented to the emergency room on 4/20 with fall, negative neuro exam and was sent home.  Subsequently developed slurred speech, unsteady gait, right leg weakness right facial droop and coughing and choking after eating so brought back to the ER.  An MRI showed small acute infarct in the left basal ganglia/corona radiata and right thalamus.     Assessment & Plan:   Active Problems:   Stroke (cerebrum) (HCC)   CVA (cerebral vascular accident) ([place])   Coronary artery disease involving native coronary artery of native heart without angina pectoris   Deep venous thrombosis (HCC)   Fatty liver   Dementia without behavioral disturbance (HCC)   Tachycardia   Hypertension   Leukocytosis   Dysphagia due to recent cerebral infarction   Hemiplegia as late effect of cerebral infarction [facility])   Advanced care planning/counseling discussion   Goals of care, counseling/discussion   Apraxia due to acute stroke [facility])   Palliative care by specialist   Protein-calorie malnutrition, severe  Acute bilateral ischemic stroke: Left basal ganglia stroke, right thalamic infarct secondary to large vessel disease: Clinical findings, right facial droop, right hemiplegia and dysphagia. CT head findings, initially negative.  Follow-up CT scan with new periventricular infarct MRI of the brain, left basal ganglia and right thalamic infarcts.  Brain atrophy. Repeat MRI, 4/27, slight increase in size of left basal ganglia stroke, evaluation of right thalamic  infarcts. CT of the head and neck, No large vessel occlusion.  Left ICA plaque with 50% stenosis.  Right CEA patent.  Bilateral PCA moderate to severe stenosis. 2D echocardiogram, EF 55%.  Grade 1 diastolic dysfunction.  No emboli. Antiplatelet therapy, on aspirin 81 mg and Eliquis 5 mg twice a day before event, continued LDL, 160 statin, Zocor increased from 10-40. Hemoglobin A1c, 5.  No indication for treatment. Therapy recommendations, acute inpatient rehab vs SNF  Dysphagia: Due to acute stroke.  Unable to swallow.  Core track tube pulled out 4/29 morning. Reinserted today.  Also continue follow-up with speech therapy. Patient with poor clinical recovery and progression. Patient has fluctuating mentation, however he is agreeable to have NG tube put back in and start nutrition and wants to continue rehab.  Intracranial stenosis: Medical management.  Avoid low blood pressure.  Ethics Barrie Folk of care:  Palliative care consulted to discuss, coordinate and counseled patient and family. Insert NG tube and start nutrition, will see if patient can tolerate rehab. If unable to tolerate rehab, will give him next few days to see if he can swallow safely. If he is not able to swallow safely next 7 to 10 days, need to consider PEG tube feeding versus hospice care.   DVT prophylaxis: Eliquis Code Status: Full code Family Communication: Patient's son at the bedside. Disposition Plan: Status is: Inpatient  Remains inpatient appropriate because:Inpatient level of care appropriate due to severity of illness   Dispo: The patient is from: Home              Anticipated d/c is to: CIR  Anticipated d/c date is: unknown.  No enteral nutrition today.              Patient currently not medically stable.   Consultants:   Neurology  Palliative medicine  Procedures:   None  Antimicrobials:   None   Subjective: Patient seen and examined.  Overnight with some agitation, sleepless.   Family came in visited him and calmed him down.  Early morning he was sleepy.  He was able to follow commands.  Objective: Vitals:   06/05/19 2200 06/06/19 0029 06/06/19 0415 06/06/19 0849  BP: 119/75 137/71 125/68 134/71  Pulse: 75 (!) 103 97 79  Resp: 18 18 18 18   Temp: 98.5 F (36.9 C) 97.7 F (36.5 C) 98.4 F (36.9 C) 98 F (36.7 C)  TempSrc: Oral Oral Oral Oral  SpO2: 94% 96% 99% 97%  Weight:        Intake/Output Summary (Last 24 hours) at 06/06/2019 1041 Last data filed at 06/06/2019 0431 Gross per 24 hour  Intake -  Output 300 ml  Net -300 ml   Filed Weights   06/05/19 0349  Weight: 68.3 kg    Examination:  General exam: Appears calm, not in any distress.  Able to follow commands but not very interactive.   Respiratory system: Clear to auscultation. Respiratory effort normal.  No added sound.  Pacemaker in place. Cardiovascular system: S1 & S2 heard, RRR.  Pacemaker present.   Gastrointestinal system: Abdomen is nondistended, soft and nontender.  Bowel sounds present.   Central nervous system:  Left upper and lower extremities normal. Right facial droop. Dysphonia, difficulty understanding words. Right upper extremity 3-4/5 Right lower extremity 4/5 Psychiatry: Judgement and insight appear compromised. Mood & affect flat and anxious.    Data Reviewed: I have personally reviewed following labs and imaging studies  CBC: Recent Labs  Lab 05/31/19 0321 05/31/19 0321 06/01/19 0919 06/02/19 0351 06/03/19 0243 06/04/19 0453 06/05/19 0527  WBC 13.3*   < > 11.8* 15.1* 14.5* 10.7* 11.4*  NEUTROABS 10.8*  --   --   --   --   --   --   HGB 15.5   < > 14.2 15.5 13.8 13.7 14.5  HCT 44.7   < > 41.9 44.5 41.8 41.3 42.5  MCV 98.7   < > 100.5* 99.8 101.5* 103.0* 101.9*  PLT 180   < > 174 198 181 194 227   < > = values in this interval not displayed.   Basic Metabolic Panel: Recent Labs  Lab 06/01/19 0919 06/01/19 0919 06/02/19 0351 06/02/19 1530 06/03/19  0243 06/04/19 0453 06/05/19 0527  NA 140   < > 138 138 134* 141 141  K 3.2*   < > 3.8 3.8 4.1 4.0 3.9  CL 105   < > 105 102 101 103 105  CO2 27   < > 25 26 26 29 27   GLUCOSE 104*   < > 134* 134* 125* 127* 105*  BUN 24*   < > 23 25* 30* 34* 31*  CREATININE 0.70   < > 0.65 0.65 0.82 0.80 0.87  CALCIUM 8.6*   < > 8.7* 8.8* 8.6* 8.7* 8.5*  MG 2.1  --   --   --   --   --   --   PHOS 2.7  --   --   --   --   --   --    < > = values in this interval not displayed.  GFR: Estimated Creatinine Clearance: 56.7 mL/min (by C-G formula based on SCr of 0.87 mg/dL). Liver Function Tests: Recent Labs  Lab 05/31/19 0321 06/02/19 1530  AST 53* 149*  ALT 26 170*  ALKPHOS 90 136*  BILITOT 2.0* 1.7*  PROT 6.0* 5.8*  ALBUMIN 3.1* 2.7*   No results for input(s): LIPASE, AMYLASE in the last 168 hours. No results for input(s): AMMONIA in the last 168 hours. Coagulation Profile: No results for input(s): INR, PROTIME in the last 168 hours. Cardiac Enzymes: No results for input(s): CKTOTAL, CKMB, CKMBINDEX, TROPONINI in the last 168 hours. BNP (last 3 results) No results for input(s): PROBNP in the last 8760 hours. HbA1C: No results for input(s): HGBA1C in the last 72 hours. CBG: Recent Labs  Lab 06/05/19 1546 06/05/19 2010 06/05/19 2253 06/06/19 0320 06/06/19 0845  GLUCAP 98 119* 98 101* 90   Lipid Profile: No results for input(s): CHOL, HDL, LDLCALC, TRIG, CHOLHDL, LDLDIRECT in the last 72 hours. Thyroid Function Tests: No results for input(s): TSH, T4TOTAL, FREET4, T3FREE, THYROIDAB in the last 72 hours. Anemia Panel: No results for input(s): VITAMINB12, FOLATE, FERRITIN, TIBC, IRON, RETICCTPCT in the last 72 hours. Sepsis Labs: Recent Labs  Lab 06/02/19 1530 06/02/19 1629  PROCALCITON 0.16  --   LATICACIDVEN 1.7 2.0*    Recent Results (from the past 240 hour(s))  SARS CORONAVIRUS 2 (TAT 6-24 HRS) Nasopharyngeal Nasopharyngeal Swab     Status: None   Collection Time:  05/28/19  5:10 PM   Specimen: Nasopharyngeal Swab  Result Value Ref Range Status   SARS Coronavirus 2 NEGATIVE NEGATIVE Final    Comment: (NOTE) SARS-CoV-2 target nucleic acids are NOT DETECTED. The SARS-CoV-2 RNA is generally detectable in upper and lower respiratory specimens during the acute phase of infection. Negative results do not preclude SARS-CoV-2 infection, do not rule out co-infections with other pathogens, and should not be used as the sole basis for treatment or other patient management decisions. Negative results must be combined with clinical observations, patient history, and epidemiological information. The expected result is Negative. Fact Sheet for Patients: SugarRoll.be Fact Sheet for Healthcare Providers: https://www.woods-mathews.com/ This test is not yet approved or cleared by the [place] FDA and  has been authorized for detection and/or diagnosis of SARS-CoV-2 by FDA under an Emergency Use Authorization (EUA). This EUA will remain  in effect (meaning this test can be used) for the duration of the COVID-19 declaration under Section 56 4(b)(1) of the Act, 21 U.S.C. section 360bbb-3(b)(1), unless the authorization is terminated or revoked sooner. Performed at [facility], [place] [address], [place], [place] [address]   Culture, blood (x 2)     Status: None (Preliminary result)   Collection Time: 06/02/19  3:30 PM   Specimen: BLOOD LEFT HAND  Result Value Ref Range Status   Specimen Description BLOOD LEFT HAND  Final   Special Requests   Final    BOTTLES DRAWN AEROBIC AND ANAEROBIC Blood Culture adequate volume   Culture   Final    NO GROWTH 3 DAYS Performed at [facility], [place] [address], [place], [place] [address]    Report Status PENDING  Incomplete  Culture, blood (x 2)     Status: None (Preliminary result)   Collection Time: 06/02/19  3:30 PM   Specimen: BLOOD RIGHT HAND  Result Value Ref  Range Status   Specimen Description BLOOD RIGHT HAND  Final   Special Requests   Final    BOTTLES DRAWN  AEROBIC AND ANAEROBIC Blood Culture results may not be optimal due to an inadequate volume of blood received in culture bottles   Culture   Final    NO GROWTH 3 DAYS Performed at [facility], [place] [address] [place], [place] [address]    Report Status PENDING  Incomplete  Urine culture     Status: Abnormal   Collection Time: 06/03/19  1:39 PM   Specimen: Urine, Catheterized  Result Value Ref Range Status   Specimen Description URINE, CATHETERIZED  Final   Special Requests NONE  Final   Culture (A)  Final    >=100,000 COLONIES/mL AEROCOCCUS VIRIDANS Standardized susceptibility testing for this organism is not available. Performed at [facility], [place] [address] [place], [place] [address]    Report Status 06/05/2019 FINAL  Final         Radiology Studies: DG CHEST PORT 1 VIEW  Result Date: 06/05/2019 CLINICAL DATA:  Complication of feeding tube. EXAM: PORTABLE CHEST 1 VIEW COMPARISON:  June 02, 2019 FINDINGS: There is a dual lead AICD. The nasogastric tube seen on the prior study has been removed. Mild diffuse chronic appearing increased lung markings are seen without evidence of acute infiltrate, pleural effusion or pneumothorax. The cardiac silhouette is mildly enlarged. Multilevel degenerative changes seen throughout the thoracic spine. IMPRESSION: Chronic appearing increased lung markings without evidence of acute or active cardiopulmonary disease. Electronically Signed   By: Virgina Norfolk M.D.   On: 06/05/2019 01:50        Scheduled Meds: . apixaban  5 mg Per Tube BID  . aspirin  81 mg Per Tube Daily  . doxazosin  1 mg Per Tube Daily  . feeding supplement (PRO-STAT SUGAR FREE 64)  30 mL Per Tube BID  . free water  125 mL Per Tube Q4H  . multivitamin  15 mL Per Tube Daily  . simvastatin  40 mg Per Tube q1800  . sodium chloride  1 spray Each Nare QID    Continuous Infusions: . feeding supplement (OSMOLITE 1.2 CAL) Stopped (06/05/19 0042)     LOS: 9 days    Time spent: 30 minutes    Barb Merino, MD [facility] Pager [phone]

## 2019-06-06 NOTE — Progress Notes (Signed)
  Speech Language Pathology Treatment: Dysphagia  Patient Details Name: William Mann MRN: [id number] DOB: 03/29/1929 Today's Date: 06/06/2019 Time: 1036-1100 SLP Time Calculation (min) (ACUTE ONLY): 24 min  Assessment / Plan / Recommendation Clinical Impression  Pt lethargic after receiving some sedation last night when there was agitation. Session focused on education with pt's son, William Mann, and wife, Steward Drone.  We discussed the impact of subcortical and bilateral strokes on pt's speech and swallowing, provided more information about the cortrak and PEG tubes, our plan to reassess via MBS early next week and then make a decision about nutrition. William Mann verbalized understanding. William Mann had concerns about her husband's ability to participate in therapies; provided empathetic listening and support.  SLP will f/u on Monday, 5/1.  Family agrees with plan.    HPI HPI: William Mann is a 86 y.o. male with medical history significant of thyroid nodule, emphysema, GERD, right carotid endarterectomy,PVD, hypertension, hyperlipidemia, lower extremity DVT, bilateral hearing loss, presented with new onset of slurred speech and facial droop. Per chart wife also noticed significant right leg weakness, right-sided facial droop, and cough/choking after eating drink since last night. MRI shows small acute infarcts in the left basal ganglia/corona radiata and right thalamus. Pt with increasing lethargy prompting repeat MRI on 4/27 - this revealed extension of the left basal ganglia/radiating white matter tract infarction which is larger than on the initial presentation, measuring up to 2 cm in size presently.       SLP Plan  Continue with current plan of care       Recommendations  Diet recommendations: NPO(ice chips only if alert)                Oral Care Recommendations: Oral care QID Follow up Recommendations: Inpatient Rehab SLP Visit Diagnosis: Dysphagia, oropharyngeal phase (R13.12) Plan:  Continue with current plan of care       GO                [name] 06/06/2019, 11:17 AM  Estill Bamberg L. Tivis Ringer, [place] Office number [phone] Pager [phone]

## 2019-06-06 NOTE — Progress Notes (Signed)
Inpatient Rehabilitation Admissions Coordinator  Per son patient with agitation last night and had to receive medication. Now lethargic. Dr Naaman Plummer and I in to assess. Patient not at a level that I can admit to CIR today or this weekend. We will follow up on Monday. I discussed with son and wife at bedside and alerted acute team of our assessment.  Danne Baxter, RN, MSN Rehab Admissions Coordinator [phone] 06/06/2019 11:21 AM

## 2019-06-06 NOTE — Procedures (Signed)
Cortrak  Tube Type:  Cortrak - 43 inches Tube Location:  Left nare Initial Placement:  Stomach Secured by: Bridle Technique Used to Measure Tube Placement:  Documented cm marking at nare/ corner of mouth    Cortrak Tube Team Note:  Consult received to place a Cortrak feeding tube.   No x-ray is required. RN may begin using tube.    If the tube becomes dislodged please keep the tube and contact the Cortrak team at [email or web address] (password [email or web address]) for replacement.  If after hours and replacement cannot be delayed, place a NG tube and confirm placement with an abdominal x-ray.    Koleen Distance MS, RD, LDN Please refer to [facility] for RD and/or RD on-call/weekend/after hours pager

## 2019-06-06 NOTE — Progress Notes (Signed)
Brief Nutrition Note  Pt noted to have pulled out Cortrak on 4/29 at 0400. Palliative Care met with pt's family yesterday to discuss [place] and decision was made to continue full scope of treatment for now. Cortrak order was placed by MD and placement should be completed by the Cortrak team today. RD has put in orders for tube feeding; see note on 4/29 for full assessment.   Per Palliative Care, GOC will be readdressed with the family today.  William Ina, MS, RD, LDN RD pager number and weekend/on-call pager number located in [place].

## 2019-06-06 NOTE — Progress Notes (Signed)
Daily Progress Note   Patient Name: William Mann       Date: 06/06/2019 DOB: 09-19-1929  Age: 86 y.o. MRN#: [id number] Attending Physician: Barb Merino, MD Primary Care Physician: Patient, No Pcp Per Admit Date: 05/28/2019  Reason for Consultation/Follow-up: To discuss complex medical decision making related to patient's goals of care.  Subjective: Spoke with wife at bedside.  We discussed his current state.  I asked if he would want heroic measures if he passed.  She felt that he would not.  Stated he had a Living Will that indicated the same. She stated that she would not want resusciatation for herself.   We talked about transfer to CIR.  Wife indicated that she was uncertain of how things worked there.  She indicated that she had been to [facility] in the past and felt it would be a good option as well.  We also talked about William Mann going home with private help and Hospice.  Wife shared that they had a long term care policy.  I advised her to invoke it and use it now.  It can pay for care in the home or SNF.  Wife asked if I felt patient was nearing EOL.  I deferred the question as I have not seen him awake.  Afterward I called William Mann on the phone.   We talked for about 20 min.  William Mann agrees with his mother about DNR code status for his father.  He has reviewed the MOST form and states that he knows how he thinks he wants it filled out but just wants to see how his father does over the weekend.  Both William Mann and William Mann talked of the patient's love of eating.  I talked with William Mann about feeding tubes nearing EOL and why they were not the best option.  I'm hoping William Mann may have an improved swallow function on Monday - if he does not William Mann and I will have further  discussion about next best steps.   Assessment: 86 yo male suffered large stroke then suffered evolution of same.  Currently lethargic after receiving Haldol last night for agitation.   Patient Profile/HPI:  86 y.o. male  with past medical history of HTN, hyperlipidemia, DVT on Eliquis, CAD, and right carotid stenosis s/p endarderectomy admitted on 05/28/2019 with acute ischemic  stroke. Mental status worsened on 4/26 and CT showed new low density on the left concerning for acute infarction. Palliative care has been consulted to discuss [place] in the setting of extension of the CVA and clinical findings of right hemiplegia, dysarthria, and dysphagia.      Length of Stay: 9   Vital Signs: BP 118/70 (BP Location: Right Arm)   Pulse 91   Temp 97.8 F (36.6 C) (Oral)   Resp 18   Wt 68.3 kg   SpO2 94%   BMI 22.89 kg/m  SpO2: SpO2: 94 % O2 Device: O2 Device: Room Air O2 Flow Rate:         Palliative Assessment/Data: 10%     Palliative Care Plan    Recommendations/Plan:  Will follow up with family on Monday.  Gave wife my card for her to contact me over the weekend if I can be of assistance.  Code Status:  DNR  Prognosis:   TBD.  Very concerning given devastating stroke, bed bound status and inability to take nutrition.  Discharge Planning:  To Be Determined  Considering CIR  Care plan was discussed with [facility] MD, Family  Thank you for allowing the Palliative Medicine Team to assist in the care of this patient.  Total time spent:  60 min.     Greater than 50%  of this time was spent counseling and coordinating care related to the above assessment and plan.  Florentina Jenny, PA-C Palliative Medicine  Please contact Palliative MedicineTeam phone at [phone] for questions and concerns between 7 am - 7 pm.   Please see AMION for individual provider pager numbers.

## 2019-06-06 NOTE — Progress Notes (Addendum)
Per nursing, pt received Halodol during evening shift and is too lethargic to participate safely. Will continue to follow and see when medically ready.    Corinne Ports E. [place], [place] [employer] [phone]

## 2019-06-07 DIAGNOSIS — I6389 Other cerebral infarction: Secondary | ICD-10-CM

## 2019-06-07 LAB — CULTURE, BLOOD (ROUTINE X 2)
Culture: NO GROWTH
Culture: NO GROWTH
Special Requests: ADEQUATE

## 2019-06-07 LAB — GLUCOSE, CAPILLARY
Glucose-Capillary: 112 mg/dL — ABNORMAL HIGH (ref 70–99)
Glucose-Capillary: 116 mg/dL — ABNORMAL HIGH (ref 70–99)
Glucose-Capillary: 123 mg/dL — ABNORMAL HIGH (ref 70–99)
Glucose-Capillary: 130 mg/dL — ABNORMAL HIGH (ref 70–99)
Glucose-Capillary: 131 mg/dL — ABNORMAL HIGH (ref 70–99)
Glucose-Capillary: 132 mg/dL — ABNORMAL HIGH (ref 70–99)

## 2019-06-07 MED ORDER — POLYETHYLENE GLYCOL 3350 17 G PO PACK
17.0000 g | PACK | Freq: Every day | ORAL | Status: DC
  Filled 2019-06-07: qty 1

## 2019-06-07 MED ORDER — AMOXICILLIN 250 MG/5ML PO SUSR
500.0000 mg | Freq: Three times a day (TID) | ORAL | Status: AC
  Administered 2019-06-07 – 2019-06-09 (×6): 500 mg
  Administered 2019-06-09: 250 mg
  Administered 2019-06-09 – 2019-06-11 (×7): 500 mg
  Filled 2019-06-07 (×16): qty 10

## 2019-06-07 MED ORDER — POLYETHYLENE GLYCOL 3350 17 G PO PACK
17.0000 g | PACK | Freq: Every day | ORAL | Status: DC
  Administered 2019-06-08 – 2019-06-11 (×3): 17 g
  Filled 2019-06-07 (×8): qty 1

## 2019-06-07 MED ORDER — AMOXICILLIN 250 MG/5ML PO SUSR
500.0000 mg | Freq: Three times a day (TID) | ORAL | Status: DC
  Filled 2019-06-07 (×2): qty 10

## 2019-06-07 NOTE — Progress Notes (Signed)
PROGRESS NOTE    William Mann  [id number] DOB: January 03, 1930 DOA: 05/28/2019 PCP: Patient, No Pcp Per    Brief Narrative:  86 year old gentleman with history of peripheral vascular disease, hypertension, hyperlipidemia, DVT on Eliquis, permanent pacemaker, carotid stenosis status post right carotid endarterectomy, COVID-19 infection in January 2021 presented to the emergency room with slurred speech and facial droop as well as recurrent fall.  Initially presented to the emergency room on 4/20 with fall, negative neuro exam and was sent home.  Subsequently developed slurred speech, unsteady gait, right leg weakness right facial droop and coughing and choking after eating so brought back to the ER.  An MRI showed small acute infarct in the left basal ganglia/corona radiata and right thalamus.     Assessment & Plan:   Active Problems:   Stroke (cerebrum) (HCC)   CVA (cerebral vascular accident) ([place])   Coronary artery disease involving native coronary artery of native heart without angina pectoris   Deep venous thrombosis (HCC)   Fatty liver   Dementia without behavioral disturbance (HCC)   Tachycardia   Hypertension   Leukocytosis   Dysphagia due to recent cerebral infarction   Hemiplegia as late effect of cerebral infarction [facility])   Advanced care planning/counseling discussion   Goals of care, counseling/discussion   Apraxia due to acute stroke [facility])   Palliative care by specialist   Protein-calorie malnutrition, severe   DNR (do not resuscitate)   Palliative care encounter  Acute bilateral ischemic stroke: Left basal ganglia stroke, right thalamic infarct secondary to large vessel disease: Clinical findings, right facial droop, right hemiplegia and dysphagia. CT head findings, initially negative.  Follow-up CT scan with new periventricular infarct MRI of the brain, left basal ganglia and right thalamic infarcts.  Brain atrophy. Repeat MRI, 4/27, slight increase in size of  left basal ganglia stroke, evaluation of right thalamic infarcts. CT of the head and neck, No large vessel occlusion.  Left ICA plaque with 50% stenosis.  Right CEA patent.  Bilateral PCA moderate to severe stenosis. 2D echocardiogram, EF 55%.  Grade 1 diastolic dysfunction.  No emboli. Antiplatelet therapy, on aspirin 81 mg and Eliquis 5 mg twice a day before event, continued LDL, 160 statin, Zocor increased from 10-40. Hemoglobin A1c, 5.  No indication for treatment. Therapy recommendations, acute inpatient rehab vs SNF  Dysphagia: Due to acute stroke.  Unable to swallow.  Core track tube pulled out 4/29 morning. Reinserted 4/30.  Also continue follow-up with speech therapy. Patient with poor clinical recovery and progression.  Intracranial stenosis: Medical management.  Avoid low blood pressure.  Acute UTI: Unknown present on admission.  Patient growing more than 100,000 colonies of Aerococcus.  Will treat with amoxicillin for 5 days.  Ethics Bar[place] Folk of care:  Palliative care consulted to discuss, coordinate and counseled patient and family. Insert NG tube and start nutrition, will see if patient can tolerate rehab. If unable to tolerate rehab, will give him next few days to see if he can swallow safely. If he is not able to swallow safely and no appropriate recovery, will continue to discuss palliative options.   DVT prophylaxis: Eliquis Code Status: DNR Family Communication: Patient's son at the bedside 4/30. Disposition Plan: Status is: Inpatient  Remains inpatient appropriate because:Inpatient level of care appropriate due to severity of illness   Dispo: The patient is from: Home              Anticipated d/c is to: CIR versus SNF.  Anticipated d/c date is: 5/3.              Patient currently not medically stable.   Consultants:   Neurology  Palliative medicine  Procedures:   None  Antimicrobials:   None   Subjective: Seen and examined.  No  overnight events.  He was slow to respond as usual.  Tolerating tube feeding. Objective: Vitals:   06/06/19 1607 06/06/19 2048 06/07/19 0408 06/07/19 0746  BP: 118/70 102/64 (!) 151/97 99/64  Pulse: 91 99 (!) 103 92  Resp: 18 20 20 18   Temp: 97.8 F (36.6 C) 99.1 F (37.3 C) 98.2 F (36.8 C) 98.7 F (37.1 C)  TempSrc: Oral Oral Oral Oral  SpO2: 94% 95% 97% 98%  Weight:       No intake or output data in the 24 hours ending 06/07/19 1035 Filed Weights   06/05/19 0349  Weight: 68.3 kg    Examination:  General exam: Appears calm, not in any distress.  Slow to respond.  Comfortable.  Chronically sick looking. Respiratory system: Clear to auscultation. Respiratory effort normal.  No added sound.  Pacemaker in place. Cardiovascular system: S1 & S2 heard, RRR.  Pacemaker present.   Gastrointestinal system: Abdomen is nondistended, soft and nontender.  Bowel sounds present.  NG tube in place. Central nervous system:  Left upper and lower extremities normal. Mild right facial droop. Dysphonia, difficulty understanding words. Right upper extremity 3-4/5 Right lower extremity 4/5 Psychiatry: Judgement and insight appear compromised. Mood & affect flat and anxious.    Data Reviewed: I have personally reviewed following labs and imaging studies  CBC: Recent Labs  Lab 06/01/19 0919 06/02/19 0351 06/03/19 0243 06/04/19 0453 06/05/19 0527  WBC 11.8* 15.1* 14.5* 10.7* 11.4*  HGB 14.2 15.5 13.8 13.7 14.5  HCT 41.9 44.5 41.8 41.3 42.5  MCV 100.5* 99.8 101.5* 103.0* 101.9*  PLT 174 198 181 194 [id number]   Basic Metabolic Panel: Recent Labs  Lab 06/01/19 0919 06/01/19 0919 06/02/19 0351 06/02/19 1530 06/03/19 0243 06/04/19 0453 06/05/19 0527  NA 140   < > 138 138 134* 141 141  K 3.2*   < > 3.8 3.8 4.1 4.0 3.9  CL 105   < > 105 102 101 103 105  CO2 27   < > 25 26 26 29 27   GLUCOSE 104*   < > 134* 134* 125* 127* 105*  BUN 24*   < > 23 25* 30* 34* 31*  CREATININE 0.70   < >  0.65 0.65 0.82 0.80 0.87  CALCIUM 8.6*   < > 8.7* 8.8* 8.6* 8.7* 8.5*  MG 2.1  --   --   --   --   --   --   PHOS 2.7  --   --   --   --   --   --    < > = values in this interval not displayed.   GFR: Estimated Creatinine Clearance: 56.7 mL/min (by C-G formula based on SCr of 0.87 mg/dL). Liver Function Tests: Recent Labs  Lab 06/02/19 1530  AST 149*  ALT 170*  ALKPHOS 136*  BILITOT 1.7*  PROT 5.8*  ALBUMIN 2.7*   No results for input(s): LIPASE, AMYLASE in the last 168 hours. No results for input(s): AMMONIA in the last 168 hours. Coagulation Profile: No results for input(s): INR, PROTIME in the last 168 hours. Cardiac Enzymes: No results for input(s): CKTOTAL, CKMB, CKMBINDEX, TROPONINI in the last 168 hours. BNP (last 3 results)  No results for input(s): PROBNP in the last 8760 hours. HbA1C: No results for input(s): HGBA1C in the last 72 hours. CBG: Recent Labs  Lab 06/06/19 1144 06/06/19 1605 06/06/19 2045 06/07/19 0427 06/07/19 0724  GLUCAP 106* 121* 105* 132* 123*   Lipid Profile: No results for input(s): CHOL, HDL, LDLCALC, TRIG, CHOLHDL, LDLDIRECT in the last 72 hours. Thyroid Function Tests: No results for input(s): TSH, T4TOTAL, FREET4, T3FREE, THYROIDAB in the last 72 hours. Anemia Panel: No results for input(s): VITAMINB12, FOLATE, FERRITIN, TIBC, IRON, RETICCTPCT in the last 72 hours. Sepsis Labs: Recent Labs  Lab 06/02/19 1530 06/02/19 1629  PROCALCITON 0.16  --   LATICACIDVEN 1.7 2.0*    Recent Results (from the past 240 hour(s))  SARS CORONAVIRUS 2 (TAT 6-24 HRS) Nasopharyngeal Nasopharyngeal Swab     Status: None   Collection Time: 05/28/19  5:10 PM   Specimen: Nasopharyngeal Swab  Result Value Ref Range Status   SARS Coronavirus 2 NEGATIVE NEGATIVE Final    Comment: (NOTE) SARS-CoV-2 target nucleic acids are NOT DETECTED. The SARS-CoV-2 RNA is generally detectable in upper and lower respiratory specimens during the acute phase of  infection. Negative results do not preclude SARS-CoV-2 infection, do not rule out co-infections with other pathogens, and should not be used as the sole basis for treatment or other patient management decisions. Negative results must be combined with clinical observations, patient history, and epidemiological information. The expected result is Negative. Fact Sheet for Patients: SugarRoll.be Fact Sheet for Healthcare Providers: https://www.woods-mathews.com/ This test is not yet approved or cleared by the [place] FDA and  has been authorized for detection and/or diagnosis of SARS-CoV-2 by FDA under an Emergency Use Authorization (EUA). This EUA will remain  in effect (meaning this test can be used) for the duration of the COVID-19 declaration under Section 56 4(b)(1) of the Act, 21 U.S.C. section 360bbb-3(b)(1), unless the authorization is terminated or revoked sooner. Performed at [facility], [place] [address], [place], [place] [address]   Culture, blood (x 2)     Status: None   Collection Time: 06/02/19  3:30 PM   Specimen: BLOOD LEFT HAND  Result Value Ref Range Status   Specimen Description BLOOD LEFT HAND  Final   Special Requests   Final    BOTTLES DRAWN AEROBIC AND ANAEROBIC Blood Culture adequate volume   Culture   Final    NO GROWTH 5 DAYS Performed at [facility], [place] [address] [place], [place] [address]    Report Status 06/07/2019 FINAL  Final  Culture, blood (x 2)     Status: None   Collection Time: 06/02/19  3:30 PM   Specimen: BLOOD RIGHT HAND  Result Value Ref Range Status   Specimen Description BLOOD RIGHT HAND  Final   Special Requests   Final    BOTTLES DRAWN AEROBIC AND ANAEROBIC Blood Culture results may not be optimal due to an inadequate volume of blood received in culture bottles   Culture   Final    NO GROWTH 5 DAYS Performed at [facility], [place] [address] [place], [place] [address]     Report Status 06/07/2019 FINAL  Final  Urine culture     Status: Abnormal   Collection Time: 06/03/19  1:39 PM   Specimen: Urine, Catheterized  Result Value Ref Range Status   Specimen Description URINE, CATHETERIZED  Final   Special Requests NONE  Final   Culture (A)  Final    >=  100,000 COLONIES/mL AEROCOCCUS VIRIDANS Standardized susceptibility testing for this organism is not available. Performed at [facility], [place] [address], [place], [place] [address]    Report Status 06/05/2019 FINAL  Final         Radiology Studies: No results found.      Scheduled Meds: . apixaban  5 mg Per Tube BID  . aspirin  81 mg Per Tube Daily  . doxazosin  1 mg Per Tube Daily  . feeding supplement (PRO-STAT SUGAR FREE 64)  30 mL Per Tube BID  . free water  125 mL Per Tube Q4H  . multivitamin  15 mL Per Tube Daily  . simvastatin  40 mg Per Tube q1800  . sodium chloride  1 spray Each Nare QID   Continuous Infusions: . feeding supplement (OSMOLITE 1.2 CAL) 1,000 mL (06/06/19 1330)     LOS: 10 days    Time spent: 30 minutes    Barb Merino, MD [facility] Pager [phone]

## 2019-06-07 NOTE — Progress Notes (Signed)
Daily Progress Note   Patient Name: William Mann       Date: 06/07/2019 DOB: Jun 26, 1929  Age: 86 y.o. MRN#: [id number] Attending Physician: William Merino, MD Primary Care Physician: Patient, No Pcp Per Admit Date: 05/28/2019  Reason for Consultation/Follow-up: To discuss complex medical decision making related to patient's goals of care  Subjective: Visited with wife and patient at bedside.  He is awake and interactive today but still very drowsy.  Patient responds to a question with 1 word answer and drifts back to sleep.   Discussed with RN.  Wife confused and asks if I am involved with his insurance.     Assessment: Patient more alert but very frail and drowsy in the bed. No sedating meds given.  Received Ambien last night for sleep.   Patient Profile/HPI:   86 y.o.malewith past medical history of HTN, hyperlipidemia, DVT on Eliquis, CAD, and right carotid stenosis s/p endarderectomyadmitted on 4/21/2021with acute ischemic stroke.Mental status worsened on 4/26 and CT showed new low density on the left concerning for acute infarction/ expansion of previous stroke. Palliative care has been consulted to discuss [place] in the setting of extension of the CVA and clinical findings ofright hemiplegia, dysarthria, and dysphagia.      Length of Stay: 10   Vital Signs: BP 99/64 (BP Location: Right Arm)   Pulse 92   Temp 98.7 F (37.1 C) (Oral)   Resp 18   Wt 68.3 kg   SpO2 98%   BMI 22.89 kg/m  SpO2: SpO2: 98 % O2 Device: O2 Device: Room Air O2 Flow Rate:         Palliative Assessment/Data: 20%     Palliative Care Plan    Recommendations/Plan:  Requested oral care protocol  Changed miralax to per tube rather than oral.  Requested PT to revisit as I  believe [name] is rapidly getting weaker by the day.  PMT to meet with family on Monday afternoon to discuss GOC./ MOST  Code Status:  DNR  Prognosis:   < 6 months   Discharge Planning:  To Be Determined  Care plan was discussed with wife.  Thank you for allowing the Palliative Medicine Team to assist in the care of this patient.  Total time spent:  35 min.     Greater than 50%  of this time was spent counseling and coordinating care related to the above assessment and plan.  Florentina Jenny, PA-C Palliative Medicine  Please contact Palliative MedicineTeam phone at [phone] for questions and concerns between 7 am - 7 pm.   Please see AMION for individual provider pager numbers.

## 2019-06-08 LAB — CBC WITH DIFFERENTIAL/PLATELET
Abs Immature Granulocytes: 0.11 10*3/uL — ABNORMAL HIGH (ref 0.00–0.07)
Basophils Absolute: 0.1 10*3/uL (ref 0.0–0.1)
Basophils Relative: 0 %
Eosinophils Absolute: 0.1 10*3/uL (ref 0.0–0.5)
Eosinophils Relative: 1 %
HCT: 40.9 % (ref 39.0–52.0)
Hemoglobin: 13.8 g/dL (ref 13.0–17.0)
Immature Granulocytes: 1 %
Lymphocytes Relative: 10 %
Lymphs Abs: 1.2 10*3/uL (ref 0.7–4.0)
MCH: 34.2 pg — ABNORMAL HIGH (ref 26.0–34.0)
MCHC: 33.7 g/dL (ref 30.0–36.0)
MCV: 101.5 fL — ABNORMAL HIGH (ref 80.0–100.0)
Monocytes Absolute: 1.3 10*3/uL — ABNORMAL HIGH (ref 0.1–1.0)
Monocytes Relative: 11 %
Neutro Abs: 8.8 10*3/uL — ABNORMAL HIGH (ref 1.7–7.7)
Neutrophils Relative %: 77 %
Platelets: 242 10*3/uL (ref 150–400)
RBC: 4.03 MIL/uL — ABNORMAL LOW (ref 4.22–5.81)
RDW: 13.2 % (ref 11.5–15.5)
WBC: 11.4 10*3/uL — ABNORMAL HIGH (ref 4.0–10.5)
nRBC: 0 % (ref 0.0–0.2)

## 2019-06-08 LAB — GLUCOSE, CAPILLARY
Glucose-Capillary: 117 mg/dL — ABNORMAL HIGH (ref 70–99)
Glucose-Capillary: 107 mg/dL — ABNORMAL HIGH (ref 70–99)
Glucose-Capillary: 122 mg/dL — ABNORMAL HIGH (ref 70–99)
Glucose-Capillary: 111 mg/dL — ABNORMAL HIGH (ref 70–99)

## 2019-06-08 LAB — COMPREHENSIVE METABOLIC PANEL
ALT: 174 U/L — ABNORMAL HIGH (ref 0–44)
AST: 125 U/L — ABNORMAL HIGH (ref 15–41)
Albumin: 2.3 g/dL — ABNORMAL LOW (ref 3.5–5.0)
Alkaline Phosphatase: 137 U/L — ABNORMAL HIGH (ref 38–126)
Anion gap: 9 (ref 5–15)
BUN: 30 mg/dL — ABNORMAL HIGH (ref 8–23)
CO2: 24 mmol/L (ref 22–32)
Calcium: 8.2 mg/dL — ABNORMAL LOW (ref 8.9–10.3)
Chloride: 106 mmol/L (ref 98–111)
Creatinine, Ser: 0.78 mg/dL (ref 0.61–1.24)
GFR calc Af Amer: >60 mL/min (ref >60)
GFR calc non Af Amer: >60 mL/min (ref >60)
Glucose, Bld: 110 mg/dL — ABNORMAL HIGH (ref 70–99)
Potassium: 4 mmol/L (ref 3.5–5.1)
Sodium: 139 mmol/L (ref 135–145)
Total Bilirubin: 1.2 mg/dL (ref 0.3–1.2)
Total Protein: 5.2 g/dL — ABNORMAL LOW (ref 6.5–8.1)

## 2019-06-08 LAB — PHOSPHORUS: Phosphorus: 3.5 mg/dL (ref 2.5–4.6)

## 2019-06-08 LAB — MAGNESIUM: Magnesium: 2.1 mg/dL (ref 1.7–2.4)

## 2019-06-08 NOTE — Progress Notes (Signed)
PROGRESS NOTE    William Mann  [id number] DOB: 07/12/1929 DOA: 05/28/2019 PCP: Patient, No Pcp Per    Brief Narrative:  86 year old gentleman with history of peripheral vascular disease, hypertension, hyperlipidemia, DVT on Eliquis, permanent pacemaker, carotid stenosis status post right carotid endarterectomy, COVID-19 infection in January 2021 presented to the emergency room with slurred speech and facial droop as well as recurrent fall.  Initially presented to the emergency room on 4/20 with fall, negative neuro exam and was sent home.  Subsequently developed slurred speech, unsteady gait, right leg weakness right facial droop and coughing and choking after eating so brought back to the ER.  An MRI showed small acute infarct in the left basal ganglia/corona radiata and right thalamus.    Remained in the hospital with fluctuating mental status.  Now with core track tube feeding.   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  Altered mental status. CT head findings, initially negative.  Follow-up CT scan with new periventricular infarct MRI of  the brain, left basal ganglia and right thalamic infarcts.  Brain atrophy. Repeat MRI, 4/27, slight increase in size of left basal ganglia stroke, evaluation of right thalamic infarcts. CT of the head and neck, No large vessel occlusion.  Left ICA plaque with 50% stenosis.  Right CEA patent.  Bilateral PCA moderate to severe stenosis. 2D echocardiogram, EF 55%.  Grade 1 diastolic dysfunction.  No emboli. Antiplatelet therapy, on aspirin 81 mg and Eliquis 5 mg twice a day before event, continued LDL, 160 statin, Zocor increased from 10-40. Hemoglobin A1c, 5.  No indication for treatment. Therapy recommendations, acute inpatient rehab vs SNF  Dysphagia: Due to acute stroke.  Unable to swallow.  Core track tube pulled out 4/29 morning. Reinserted 4/30.  Also continue follow-up with speech therapy.  Intracranial stenosis: Medical management.  Avoid low blood pressure.  Acute UTI: Unknown present on admission.  Patient growing more than 100,000 colonies of Aerococcus.  Will treat with amoxicillin for 4/5 days.  Ethics Barrie Folk of care:  Palliative care consulted to discuss, coordinate and counseled patient and family. Inserted NG tube and started nutrition, will see if patient can tolerate rehab. If he is not able to swallow safely and unable to go to CIR, will continue to discuss palliative options.   DVT prophylaxis: Eliquis Code Status: DNR Family Communication: Wife at the bedside. Disposition Plan: Status is: Inpatient  Remains inpatient appropriate because:Inpatient level of care appropriate due to severity of illness   Dispo: The patient is from: Home              Anticipated d/c is to: CIR versus SNF.  Anticipated d/c date is: 5/3.              Patient currently not medically stable.   Consultants:   Neurology  Palliative medicine  Procedures:   None  Antimicrobials:   None   Subjective: Patient seen and examined.  No overnight events.  Remain more calm  and comfortable.  He was able to respond and try to talk with dysphonia. Objective: Vitals:   06/07/19 2111 06/07/19 2338 06/08/19 0403 06/08/19 0815  BP: 118/88 124/78 106/68 103/75  Pulse: 100 86 96 89  Resp: 20 18 20 16   Temp: 97.8 F (36.6 C) 98.1 F (36.7 C) 98.2 F (36.8 C) 98.7 F (37.1 C)  TempSrc: Oral Oral Oral Oral  SpO2: 95% 95% 96% 99%  Weight:        Intake/Output Summary (Last 24 hours) at 06/08/2019 1147 Last data filed at 06/08/2019 0030 Gross per 24 hour  Intake --  Output 500 ml  Net -500 ml   Filed Weights   06/05/19 0349  Weight: 68.3 kg    Examination:  General exam: Appears calm, not in any distress.  Comfortable.  Chronically sick looking.  Following commands. Respiratory system: Clear to auscultation. Respiratory effort normal.  No added sound.  Pacemaker in place. Cardiovascular system: S1 & S2 heard, RRR.  Pacemaker present.   Gastrointestinal system: Abdomen is nondistended, soft and nontender.  Bowel sounds present.  NG tube in place. Central nervous system:  Left upper and lower extremities normal. Mild right facial droop. Dysphonia, difficulty understanding words, however he is able to understand most of the things and following commands. Right upper extremity 3-4/5 Right lower extremity 4/5 Psychiatry: Judgement and insight appear compromised. Mood & affect flat.    Data Reviewed: I have personally reviewed following labs and imaging studies  CBC: Recent Labs  Lab 06/02/19 0351 06/03/19 0243 06/04/19 0453 06/05/19 0527 06/08/19 0805  WBC 15.1* 14.5* 10.7* 11.4* 11.4*  NEUTROABS  --   --   --   --  8.8*  HGB 15.5 13.8 13.7 14.5 13.8  HCT 44.5 41.8 41.3 42.5 40.9  MCV 99.8 101.5* 103.0* 101.9* 101.5*  PLT 198 181 194 227 [id number]   Basic Metabolic Panel: Recent Labs  Lab 06/02/19 1530 06/03/19 0243 06/04/19 0453 06/05/19 0527 06/08/19 0805  NA 138 134* 141 141 139  K 3.8 4.1 4.0 3.9 4.0  CL 102 101 103 105 106  CO2 26 26 29  27 24   GLUCOSE 134* 125* 127* 105* 110*  BUN 25* 30* 34* 31* 30*  CREATININE 0.65 0.82 0.80 0.87 0.78  CALCIUM 8.8* 8.6* 8.7* 8.5* 8.2*  MG  --   --   --   --  2.1  PHOS  --   --   --   --  3.5   GFR: Estimated Creatinine Clearance: 61.7 mL/min (by C-G formula based on SCr of 0.78 mg/dL). Liver Function Tests: Recent Labs  Lab 06/02/19 1530 06/08/19 0805  AST 149* 125*  ALT 170* 174*  ALKPHOS 136* 137*  BILITOT 1.7* 1.2  PROT 5.8* 5.2*  ALBUMIN 2.7* 2.3*   No results for input(s): LIPASE, AMYLASE in the last 168 hours. No results for input(s): AMMONIA in the last 168 hours. Coagulation Profile: No results for input(s): INR, PROTIME in the last 168 hours. Cardiac Enzymes: No results for input(s): CKTOTAL, CKMB, CKMBINDEX, TROPONINI in the last 168 hours. BNP (last 3 results) No results for input(s): PROBNP in the last 8760  hours. HbA1C: No results for input(s): HGBA1C in the last 72 hours. CBG: Recent Labs  Lab 06/07/19 1632 06/07/19 2013 06/07/19 2341 06/08/19 0405 06/08/19 0813  GLUCAP 131* 130* 116* 117* 107*   Lipid Profile: No results for input(s): CHOL, HDL, LDLCALC, TRIG, CHOLHDL, LDLDIRECT in the last 72 hours. Thyroid Function Tests: No results for input(s): TSH, T4TOTAL, FREET4, T3FREE, THYROIDAB in the last 72 hours. Anemia Panel: No results for input(s): VITAMINB12, FOLATE, FERRITIN, TIBC, IRON, RETICCTPCT in the last 72 hours. Sepsis Labs: Recent Labs  Lab 06/02/19 1530 06/02/19 1629  PROCALCITON 0.16  --   LATICACIDVEN 1.7 2.0*    Recent Results (from the past 240 hour(s))  Culture, blood (x 2)     Status: None   Collection Time: 06/02/19  3:30 PM   Specimen: BLOOD LEFT HAND  Result Value Ref Range Status   Specimen Description BLOOD LEFT HAND  Final   Special Requests   Final    BOTTLES DRAWN AEROBIC AND ANAEROBIC Blood Culture adequate volume   Culture   Final    NO GROWTH 5 DAYS Performed at [facility], [place] [address],  [place], [place] [address]    Report Status 06/07/2019 FINAL  Final  Culture, blood (x 2)     Status: None   Collection Time: 06/02/19  3:30 PM   Specimen: BLOOD RIGHT HAND  Result Value Ref Range Status   Specimen Description BLOOD RIGHT HAND  Final   Special Requests   Final    BOTTLES DRAWN AEROBIC AND ANAEROBIC Blood Culture results may not be optimal due to an inadequate volume of blood received in culture bottles   Culture   Final    NO GROWTH 5 DAYS Performed at [facility], [place] [address] [place], [place] [address]    Report Status 06/07/2019 FINAL  Final  Urine culture     Status: Abnormal   Collection Time: 06/03/19  1:39 PM   Specimen: Urine, Catheterized  Result Value Ref Range Status   Specimen Description URINE, CATHETERIZED  Final   Special Requests NONE  Final   Culture (A)  Final    >=100,000 COLONIES/mL AEROCOCCUS VIRIDANS Standardized susceptibility testing for this organism is not available. Performed at [facility], [place] [address] [place], [place] [address]    Report Status 06/05/2019 FINAL  Final         Radiology Studies: No results found.      Scheduled Meds: . amoxicillin  500 mg Per Tube Q8H  . apixaban  5 mg Per Tube BID  . aspirin  81 mg Per Tube Daily  . doxazosin  1 mg Per Tube Daily  . feeding supplement (PRO-STAT SUGAR FREE 64)  30 mL Per Tube BID  . free water  125 mL Per Tube Q4H  . multivitamin  15 mL Per Tube Daily  . polyethylene glycol  17 g Per Tube Daily  . simvastatin  40 mg Per Tube q1800  . sodium chloride  1 spray Each Nare QID   Continuous Infusions: . feeding supplement (OSMOLITE 1.2 CAL) 1,000 mL (06/08/19 0114)     LOS: 11 days    Time spent: 30 minutes    Barb Merino, MD [facility] Pager [phone]

## 2019-06-09 ENCOUNTER — Inpatient Hospital Stay (HOSPITAL_COMMUNITY): Payer: Medicare Other

## 2019-06-09 DIAGNOSIS — I69351 Hemiplegia and hemiparesis following cerebral infarction affecting right dominant side: Secondary | ICD-10-CM

## 2019-06-09 DIAGNOSIS — F0150 Vascular dementia without behavioral disturbance: Secondary | ICD-10-CM

## 2019-06-09 LAB — GLUCOSE, CAPILLARY
Glucose-Capillary: 113 mg/dL — ABNORMAL HIGH (ref 70–99)
Glucose-Capillary: 116 mg/dL — ABNORMAL HIGH (ref 70–99)
Glucose-Capillary: 121 mg/dL — ABNORMAL HIGH (ref 70–99)
Glucose-Capillary: 122 mg/dL — ABNORMAL HIGH (ref 70–99)
Glucose-Capillary: 136 mg/dL — ABNORMAL HIGH (ref 70–99)
Glucose-Capillary: 140 mg/dL — ABNORMAL HIGH (ref 70–99)

## 2019-06-09 NOTE — Progress Notes (Signed)
Physical Therapy Treatment Patient Details Name: William Mann MRN: [id number] DOB: [birth year] Today's Date: 06/09/2019    History of Present Illness William Mann is a 86 y.o. male with history of  R ICA stenosis s/p R CEA, PVD, hypertension, hyperlipidemia, lower extremity DVT on Eliquis, carotid artery occlusion, atherosclerosis and bilateral hearing loss presenting with dysarthria, facial droop, R leg weakness.  Found to have L BG/CR and R PLIC/thalamic infarcts. Repeat CT 4/26 and MRI 4/27 revealed extension of the left basal ganglia/radiating white matter tract infarction.    PT Comments    Patient seen mid-afternoon, after returning from a MBS and after a bath. He was fatigued, but wiling to participate. He tolerated standing in stedy x 1 for ~2 minutes. As fatigues, his lean to his right increases. On second standing trial, he did not achieve full standing and stood~15 seconds. He had diarrhea while seated on the Stedy and remainder of session included return to supine and rolling multiple times to clean patient and change linens. Currently pt is questionable for being able to tolerate 3 hours of therapy at CIR level, however he is motivated and if sessions allow rest breaks, he may be able to progress.    Follow Up Recommendations  CIR;Supervision/Assistance - 24 hour     Equipment Recommendations  None recommended by PT(defer to post acute)    Recommendations for Other Services       Precautions / Restrictions Precautions Precautions: Fall Restrictions Weight Bearing Restrictions: No    Mobility  Bed Mobility Overal bed mobility: Needs Assistance Bed Mobility: Supine to Sit;Sit to Supine     Supine to sit: Mod assist;+2 for physical assistance Sit to supine: Total assist;+2 for physical assistance   General bed mobility comments: MOD A +2 to elevate trunk; able to initiate advancing legs to EOB but utlimately requried MOD to advance EOB. total A +2 to  return to supine d/t fatigue  Transfers Overall transfer level: Needs assistance   Transfers: Sit to/from Stand Sit to Stand: Mod assist;+2 physical assistance         General transfer comment: assist to support R UE and to power up into standing; multimdal cues for midline posture; stood x 2  Ambulation/Gait                 Stairs             Wheelchair Mobility    Modified Rankin (Stroke Patients Only) Modified Rankin (Stroke Patients Only) Pre-Morbid Rankin Score: Moderate disability Modified Rankin: Severe disability     Balance Overall balance assessment: Needs assistance Sitting-balance support: Feet supported;Bilateral upper extremity supported Sitting balance-Leahy Scale: Poor Sitting balance - Comments: pt able to maintain sitting balance with min guard for safety and cues for righting posture to midline Postural control: Right lateral lean Standing balance support: Bilateral upper extremity supported Standing balance-Leahy Scale: Poor Standing balance comment: R lateral lean in standing needing +2 assist                            Cognition Arousal/Alertness: Lethargic Behavior During Therapy: Flat affect Overall Cognitive Status: Impaired/Different from baseline Area of Impairment: Following commands;Attention;Safety/judgement;Awareness                   Current Attention Level: Sustained   Following Commands: Follows one step commands with increased time Safety/Judgement: Decreased awareness of deficits;Decreased awareness of safety Awareness: Intellectual   General  Comments: sleeping soundly on arrival; family requested we awaken him; became very alert at EOB and with standing; by end of session very fatigued and not able to help with rolling very much      Exercises      General Comments General comments (skin integrity, edema, etc.): Patient with diarrhea while seated and resting on Stedy. Assisted pt to sitting  on EOB, he sat for ~4 minutes with minguard assist while supplies obtained to minimize soiling his bed as returned to supine; rolling rt and left multiple times to clean patient (ultimately RN in to assist). Pt very fatigued by the end of the session.       Pertinent Vitals/Pain Pain Assessment: Faces Faces Pain Scale: Hurts a little bit Pain Location: general discomfort Pain Descriptors / Indicators: Discomfort Pain Intervention(s): Limited activity within patient's tolerance;Monitored during session    Home Living                      Prior Function            PT Goals (current goals can now be found in the care plan section) Acute Rehab PT Goals Patient Stated Goal: to go to rehab Time For Goal Achievement: 06/12/19 Potential to Achieve Goals: Fair Progress towards PT goals: Progressing toward goals    Frequency    Min 4X/week      PT Plan Current plan remains appropriate    Co-evaluation PT/OT/SLP Co-Evaluation/Treatment: Yes Reason for Co-Treatment: Complexity of the patient's impairments (multi-system involvement);For patient/therapist safety;To address functional/ADL transfers PT goals addressed during session: Mobility/safety with mobility;Balance;Strengthening/ROM OT goals addressed during session: ADL's and self-care      AM-PAC PT "6 Clicks" Mobility   Outcome Measure  Help needed turning from your back to your side while in a flat bed without using bedrails?: A Lot Help needed moving from lying on your back to sitting on the side of a flat bed without using bedrails?: A Lot Help needed moving to and from a bed to a chair (including a wheelchair)?: A Lot Help needed standing up from a chair using your arms (e.g., wheelchair or bedside chair)?: A Lot Help needed to walk in hospital room?: Total Help needed climbing 3-5 steps with a railing? : Total 6 Click Score: 10    End of Session Equipment Utilized During Treatment: Gait belt Activity  Tolerance: Patient limited by fatigue Patient left: with call bell/phone within reach;in bed;with bed alarm set;with family/visitor present Nurse Communication: Mobility status PT Visit Diagnosis: Other abnormalities of gait and mobility (R26.89);Muscle weakness (generalized) (M62.81);Other symptoms and signs involving the nervous system (R29.898)     Time: 1510-1555 PT Time Calculation (min) (ACUTE ONLY): 45 min  Charges:  $Therapeutic Activity: 23-37 mins                      Arby Barrette, PT Pager [phone]    Rexanne Mano 06/09/2019, 6:09 PM

## 2019-06-09 NOTE — Progress Notes (Signed)
Inpatient Rehab Admissions Coordinator:   Met with patient, wife, and son, at bedside. Pt. Is alert and able to speak some this morning, though speech remains slurred. I am still following for potential CIR admit, reached out to PT/OT to see if they feel patient could tolerate 3 hours of therapy or if SNF is a more appropriate dispo.    , MS, CCC-SLP Rehab Admissions Coordinator  [phone] (celll) [phone] (office)  

## 2019-06-09 NOTE — Progress Notes (Signed)
Daily Progress Note   Patient Name: William Mann       Date: 06/09/2019 DOB: [birth year]  Age: 86 y.o. MRN#: [id number] Attending Physician: Barb Merino, MD Primary Care Physician: Patient, No Pcp Per Admit Date: 05/28/2019  Reason for Consultation/Follow-up: Establishing goals of care  Subjective: Patient awake, initially crying when PT attempting to work with him, but easily calmed. Swallow eval results are pending. Able to pull up with 2 person assist to steady.  Son, Liliane Channel and [place] at bedside. They are encouraged by observing his participation with PT.  Review of Systems  Unable to perform ROS: Mental acuity    Length of Stay: 12  Current Medications: Scheduled Meds:  . amoxicillin  500 mg Per Tube Q8H  . apixaban  5 mg Per Tube BID  . aspirin  81 mg Per Tube Daily  . doxazosin  1 mg Per Tube Daily  . feeding supplement (PRO-STAT SUGAR FREE 64)  30 mL Per Tube BID  . free water  125 mL Per Tube Q4H  . multivitamin  15 mL Per Tube Daily  . polyethylene glycol  17 g Per Tube Daily  . simvastatin  40 mg Per Tube q1800  . sodium chloride  1 spray Each Nare QID    Continuous Infusions: . feeding supplement (OSMOLITE 1.2 CAL) 1,000 mL (06/08/19 0114)    PRN Meds: acetaminophen **OR** acetaminophen (TYLENOL) oral liquid 160 mg/5 mL **OR** acetaminophen, zolpidem  Physical Exam Vitals and nursing note reviewed.  Constitutional:      Comments: Frail  Pulmonary:     Effort: Pulmonary effort is normal.  Neurological:     Sensory: Sensory deficit present.     Motor: Weakness present.             Vital Signs: BP 116/79 (BP Location: Left Arm)   Pulse 84   Temp 98 F (36.7 C) (Oral)   Resp 18   Wt 68.3 kg   SpO2 96%   BMI 22.89 kg/m  SpO2: SpO2: 96 % O2  Device: O2 Device: Room Air O2 Flow Rate:    Intake/output summary:   Intake/Output Summary (Last 24 hours) at 06/09/2019 1535 Last data filed at 06/09/2019 0504 Gross per 24 hour  Intake -  Output 650 ml  Net -650 ml   LBM: Last  BM Date: 06/08/19 Baseline Weight: Weight: 68.3 kg Most recent weight: Weight: 68.3 kg       Palliative Assessment/Data:PPS: 10%     Patient Active Problem List   Diagnosis Date Noted  . Protein-calorie malnutrition, severe 06/06/2019  . DNR (do not resuscitate)   . Palliative care encounter   . Dysphagia due to recent cerebral infarction   . Hemiplegia as late effect of cerebral infarction ([place])   . Advanced care planning/counseling discussion   . Goals of care, counseling/discussion   . 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 Palliative care by specialist   . Coronary artery disease involving native coronary artery of native heart without angina pectoris   . Deep venous thrombosis ([place])   . Fatty liver   . Dementia without behavioral disturbance ([place])   . Tachycardia   . Hypertension   . Leukocytosis   . Stroke (cerebrum) ([place]) 05/28/2019  . CVA (cerebral vascular accident) ([place]) 05/28/2019  . Pacemaker 03/24/2019  . Complete heart block ([place]) 12/20/2018  . Aftercare following surgery of the circulatory system 02/17/2014  . Bilateral carotid artery occlusion 02/17/2014  . Aftercare following surgery of the circulatory system, [place] 01/17/2013  . Numbness and tingling 02/27/2012  . Peripheral vascular disease, unspecified ([place]) 02/27/2012  . Occlusion and stenosis of carotid artery without mention of cerebral infarction 03/14/2011    Palliative Care Assessment & Plan   Patient Profile: 86 y.o.malewith past medical history of HTN, hyperlipidemia, DVT on Eliquis, CAD, and right carotid stenosis s/p endarderectomyadmitted on 4/21/2021with acute ischemic stroke.Mental status worsened on 4/26 and CT showed new low density on the left  concerning for acute infarction/ expansion of previous stroke. Palliative care has been consulted to discuss [place] in the setting of extension of the CVA and clinical findings ofright hemiplegia, dysarthria, and dysphagia. Assessment/Recommendations/Plan   Awaiting results and recommendations of swallow eval and PT recommendations re: CIR?   Plan made to f/u with Liliane Channel and Steward Drone tomorrow morning at 11am  Goals of Care and Additional Recommendations:  Limitations on Scope of Treatment: Full Scope Treatment  Code Status:  DNR  Prognosis:   Unable to determine  Discharge Planning:  To Be Determined  Care plan was discussed with Liliane Channel and [place].  Thank you for allowing the Palliative Medicine Team to assist in the care of this patient.   Time In: 1450 Time Out: 1515 Total Time 25 mins Prolonged Time Billed no      Greater than 50%  of this time was spent counseling and coordinating care related to the above assessment and plan.  Mariana Kaufman, AGNP-C Palliative Medicine   Please contact Palliative Medicine Team phone at [phone] for questions and concerns.

## 2019-06-09 NOTE — Progress Notes (Addendum)
Modified Barium Swallow Progress Note  Patient Details  Name: Creed Beranek MRN: [id number] Date of Birth: Oct 16, 1929  Today's Date: 06/09/2019  Modified Barium Swallow completed.  Full report located under Chart Review in the Imaging Section.  Brief recommendations include the following:  Clinical Impression  Pt continues to present with a moderate-severe oropharyngeal dysphagia,  Oral phase is marked by poor control/cohesion with right sulci residual, anterior spill and delayed and inefficient transfer. Propulsion into pharynx was weak and incomplete, with portion of bolus remaining in oral cavity.  Mobility of larynx was limited, and there was minimal movement of epiglottis into horizontal and incomplete inversion over larynx.  Incomplete laryngeal vestibule closure led to immediate aspiration of teaspoon boluses of thin liquids, evoking a cough response.  Nectar, honey, and pureed barium tended to pool in valleculae and pyriforms with reduced transfer through UES due to impaired pharyngeal squeeze.  It is difficult to discern any meaningful improvement in swallow function.  There remains significant oral and pharyngeal residue after the swallow, and all these materials, even the thickest, are at risk of being aspirated after the swallow with accumulation of boluses. Recommend continuing NPO status for now; pt currently has a cortrak for nutrition.  I anticipate recovery of swallow function could take quite some time.  SLP will continue to follow for therapeutic POs (nectar or honey thick liquids).  Continue conservative offering of ice chips (after oral care) to mobilize musculature and help maintain health of oral mucosa. Discussed results of MBS with pt's son (who was present for study) and pt's wife this afternoon.    Swallow Evaluation Recommendations       SLP Diet Recommendations: NPO;Ice chips PRN after oral care                       Oral Care Recommendations: Oral care  QID       Rahmir Beever L. Tivis Ringer, [place] Office number [phone] Pager [phone]  [name] 06/09/2019,3:24 PM

## 2019-06-09 NOTE — Progress Notes (Signed)
  Speech Language Pathology Treatment: Dysphagia  Patient Details Name: William Mann MRN: [id number] DOB: Jul 18, 1929 Today's Date: 06/09/2019 Time: [id number] SLP Time Calculation (min) (ACUTE ONLY): 35 min  Assessment / Plan / Recommendation Clinical Impression  Pt alert, emotional this am. Provided extensive oral care.  Son at bedside.  Provided boluses of ice chips and teaspoons of water.  Pt required tactile/verbal cues to seal lips, chew on left side, and initiate an effortful swallow.  Required cues on 100% of trials.  Delayed cough noted on 50% of trials.  Will repeat MBS as planned to determine any change since last study on 4/23.  Richardson Landry agrees results may help with decision-making.  D/W Dr. Sloan Leiter.  MBS at 1:00 today.   HPI HPI: William Mann is a 86 y.o. male with medical history significant of thyroid nodule, emphysema, GERD, right carotid endarterectomy,PVD, hypertension, hyperlipidemia, lower extremity DVT, bilateral hearing loss, presented with new onset of slurred speech and facial droop. Per chart wife also noticed significant right leg weakness, right-sided facial droop, and cough/choking after eating drink since last night. MRI shows small acute infarcts in the left basal ganglia/corona radiata and right thalamus. Pt with increasing lethargy prompting repeat MRI on 4/27 - this revealed extension of the left basal ganglia/radiating white matter tract infarction which is larger than on the initial presentation, measuring up to 2 cm in size presently.       SLP Plan  MBS       Recommendations  Diet recommendations: NPO                Oral Care Recommendations: Oral care QID SLP Visit Diagnosis: Dysphagia, oropharyngeal phase (R13.12) Plan: MBS       GO                [name] 06/09/2019, 9:33 AM  Estill Bamberg L. Tivis Ringer, [place] Office number [phone] Pager [phone]

## 2019-06-09 NOTE — Progress Notes (Signed)
Occupational Therapy Treatment Patient Details Name: William Mann MRN: [id number] DOB: 05/19/1929 Today's Date: 06/09/2019    History of present illness [name] is a 86 y.o. male with history of  R ICA stenosis s/p R CEA, PVD, hypertension, hyperlipidemia, lower extremity DVT on Eliquis, carotid artery occlusion, atherosclerosis and bilateral hearing loss presenting with dysarthria, facial droop, R leg weakness.  Found to have L BG/CR and R PLIC/thalamic infarcts. Repeat CT 4/26 and MRI 4/27 revealed extension of the left basal ganglia/radiating white matter tract infarction.   OT comments  Pt making minimal progress towards OT goals this session. Pt fatigued upon OT/PT arrival and noted to be emotional once arouse. Overall, pt required MOD A +2 for all aspects of mobility. Pt able to sit<>stand to stedy with MOD A +2 but presents with heavy R lateral lean needing MOD A to maintain upright position. Pt noted to be incontinent of bowels after stand in stedy needing to return to supine with total A+2. Feel pt currently more appropriate for SNF placement d/t decreased activity tolerance however feel pt would benefit from structured environment to maximize functional independence.Updated DC recs as indicated below and collaborated with OTR on updated POC. Will continue to follow acutely per POC for OT needs.    Follow Up Recommendations  SNF;Supervision/Assistance - 24 hour;CIR    Equipment Recommendations  3 in 1 bedside commode    Recommendations for Other Services      Precautions / Restrictions Precautions Precautions: Fall Restrictions Weight Bearing Restrictions: No       Mobility Bed Mobility Overal bed mobility: Needs Assistance Bed Mobility: Supine to Sit;Sit to Supine     Supine to sit: Mod assist;+2 for physical assistance Sit to supine: Total assist;+2 for physical assistance   General bed mobility comments: MOD A +2 to elevate trunk; able to initiate  advancing legs to EOB but utlimately requried MOD to advance EOB. total A +2 to return to supine d/t fatigue  Transfers Overall transfer level: Needs assistance   Transfers: Sit to/from Stand Sit to Stand: Mod assist;+2 physical assistance         General transfer comment: assist to support R UE and to power up into standing; multimdal cues for midline posture     Balance Overall balance assessment: Needs assistance Sitting-balance support: Feet supported;Bilateral upper extremity supported Sitting balance-Leahy Scale: Poor Sitting balance - Comments: pt able to maintain sitting balance with min guard for safety and cues for righting posture to midline   Standing balance support: Bilateral upper extremity supported Standing balance-Leahy Scale: Poor Standing balance comment: R lateral lean in standing needing +2 assist                           ADL either performed or assessed with clinical judgement   ADL Overall ADL's : Needs assistance/impaired     Grooming: Wash/dry face;Sitting;Total assistance;Min guard Grooming Details (indicate cue type and reason): initial hand over hand assist progressing to min guard f     Lower Body Bathing: +2 for physical assistance;Total assistance;Bed level Lower Body Bathing Details (indicate cue type and reason): post incontient BM Upper Body Dressing : Bed level;Maximal assistance Upper Body Dressing Details (indicate cue type and reason): don new hospital gown Lower Body Dressing: Total assistance;Bed level Lower Body Dressing Details (indicate cue type and reason): to don new socks Toilet Transfer: Moderate assistance;Maximal assistance;+2 for safety/equipment;+2 for physical assistance Toilet Transfer Details (  indicate cue type and reason): simulated via functioanl mobility with stedy; deferred transfer to chair d/t incontinent BM in transit Toileting- Clothing Manipulation and Hygiene: +2 for physical assistance;Total  assistance;Bed level       Functional mobility during ADLs: Moderate assistance;Maximal assistance;+2 for physical assistance;+2 for safety/equipment(sit<>stand in stedy) General ADL Comments: pt more lethargic compared to prior session. session focus on sit<>stand to stedy needing MOD - MAX A d/t R lateral lean. pt complete x3 sit<>stands. pt with incontinent BM needing to return to supine for clean up     Vision       Perception     Praxis      Cognition Arousal/Alertness: Lethargic Behavior During Therapy: Flat affect Overall Cognitive Status: Impaired/Different from baseline Area of Impairment: Following commands;Attention;Safety/judgement;Awareness                   Current Attention Level: Focused   Following Commands: Follows one step commands with increased time Safety/Judgement: Decreased awareness of deficits;Decreased awareness of safety Awareness: Intellectual   General Comments: pt lethargic during session but able to follow commands with increased time. pt initially emotional upon therapy arrival        Exercises     Shoulder Instructions       General Comments pts William Mann and William Mann present during session offering support and encouragment    Pertinent Vitals/ Pain       Pain Assessment: Faces Faces Pain Scale: Hurts a little bit Pain Location: general discomfort Pain Descriptors / Indicators: Discomfort Pain Intervention(s): Monitored during session;Repositioned  Home Living                                          Prior Functioning/Environment              Frequency  Min 2X/week        Progress Toward Goals  OT Goals(current goals can now be found in the care plan section)  Progress towards OT goals: Progressing toward goals  Acute Rehab OT Goals Patient Stated Goal: to go to rehab Time For Goal Achievement: 06/13/19 Potential to Achieve Goals: Good  Plan Discharge plan needs to be updated     Co-evaluation    PT/OT/SLP Co-Evaluation/Treatment: Yes Reason for Co-Treatment: Complexity of the patient's impairments (multi-system involvement);Necessary to address cognition/behavior during functional activity;To address functional/ADL transfers;For patient/therapist safety   OT goals addressed during session: ADL's and self-care      AM-PAC OT "6 Clicks" Daily Activity     Outcome Measure   Help from another person eating meals?: A Lot Help from another person taking care of personal grooming?: A Lot Help from another person toileting, which includes using toliet, bedpan, or urinal?: Total Help from another person bathing (including washing, rinsing, drying)?: A Lot Help from another person to put on and taking off regular upper body clothing?: A Lot Help from another person to put on and taking off regular lower body clothing?: Total 6 Click Score: 10    End of Session Equipment Utilized During Treatment: Gait belt;Other (comment)(stedy)  OT Visit Diagnosis: Unsteadiness on feet (R26.81);Muscle weakness (generalized) (M62.81);Other symptoms and signs involving cognitive function   Activity Tolerance Patient limited by lethargy;Patient limited by fatigue;Other (comment)(incontinent BM)   Patient Left in bed;with call bell/phone within reach;with family/visitor present   Nurse Communication Mobility status        Time:  [id number] OT Time Calculation (min): 26 min  Charges: OT General Charges $OT Visit: 1 Visit OT Treatments $Self Care/Home Management : 8-22 mins  Lanier Clam., COTA/L Acute Rehabilitation Services [phone] [place] 06/09/2019, 4:34 PM

## 2019-06-09 NOTE — Progress Notes (Signed)
PROGRESS NOTE    William Mann  [id number] DOB: [birth year] DOA: 05/28/2019 PCP: Patient, No Pcp Per    Brief Narrative:  86 year old gentleman with history of peripheral vascular disease, hypertension, hyperlipidemia, DVT on Eliquis, permanent pacemaker, carotid stenosis status post right carotid endarterectomy, COVID-19 infection in January 2021 presented to the emergency room with slurred speech and facial droop as well as recurrent fall.  Initially presented to the emergency room on 4/20 with fall, negative neuro exam and was sent home.  Subsequently developed slurred speech, unsteady gait, right leg weakness right facial droop and coughing and choking after eating so brought back to the ER.  An MRI showed small acute infarct in the left basal ganglia/corona radiata and right thalamus.    Remained in the hospital with fluctuating mental status.  Now with core track tube feeding.   Assessment & Plan:   Active Problems:   Stroke (cerebrum) (HCC)   CVA (cerebral vascular accident) ([place])   Coronary artery disease involving native coronary artery of native heart without angina pectoris   Deep venous thrombosis (HCC)   Fatty liver   Dementia without behavioral disturbance (HCC)   Tachycardia   Hypertension   Leukocytosis   Dysphagia due to recent cerebral infarction   Hemiplegia as late effect of cerebral infarction [facility])   Advanced care planning/counseling discussion   Goals of care, counseling/discussion   Apraxia due to acute stroke [facility])   Palliative care by specialist   Protein-calorie malnutrition, severe   DNR (do not resuscitate)   Palliative care encounter  Acute bilateral ischemic stroke: Left basal ganglia stroke, right thalamic infarct secondary to large vessel disease: Clinical findings, right facial droop, right hemiplegia and dysphagia.  Altered mental status. CT head findings, initially negative.  Follow-up CT scan with new periventricular infarct MRI of  the brain, left basal ganglia and right thalamic infarcts.  Brain atrophy. Repeat MRI, 4/27, slight increase in size of left basal ganglia stroke, evaluation of right thalamic infarcts. CT of the head and neck, No large vessel occlusion.  Left ICA plaque with 50% stenosis.  Right CEA patent.  Bilateral PCA moderate to severe stenosis. 2D echocardiogram, EF 55%.  Grade 1 diastolic dysfunction.  No emboli. Antiplatelet therapy, on aspirin 81 mg and Eliquis 5 mg twice a day before event, continued LDL, 160 statin, Zocor increased from 10-40 mg  Hemoglobin A1c, 5.  No indication for treatment. Therapy recommendations, acute inpatient rehab vs SNF pending clinical improvement.  Dysphagia: Due to acute stroke.  Unable to swallow.  Core track tube pulled out 4/29 morning. Reinserted 4/30.  Also continue follow-up with speech therapy. Repeat Swallow study today.  Intracranial stenosis: Medical management.  Avoid low blood pressure.  Acute UTI: Unknown present on admission.  Patient growing more than 100,000 colonies of Aerococcus.  Will treat with amoxicillin for 5 days.  Ethics Barrie Folk of care:  Palliative care consulted to discuss, coordinate and counseled patient and family. Inserted NG tube and started nutrition. If he is not able to swallow safely and unable to go to CIR, will continue to discuss palliative options. Given his difficulty swallowing condition, he will either need a PEG tube and skilled nursing facility placement or hospice. Will reevaluate with repeated speech evaluation and swallow test today.   DVT prophylaxis: Eliquis Code Status: DNR Family Communication: Son at the bedside. Disposition Plan: Status is: Inpatient  Remains inpatient appropriate because:Inpatient level of care appropriate due to severity of illness   Dispo: The patient  is from: Home              Anticipated d/c is to: CIR versus SNF.              Anticipated d/c date is: Unknown.              Patient  currently not medically stable.   Consultants:   Neurology  Palliative medicine  Procedures:   None  Antimicrobials:   None   Subjective: Seen and examined.  No overnight events.  Working with speech therapy.  Is frustrated and very emotionally labile today.  He is crying out. Coughing with attempted feeding.   Objective: Vitals:   06/08/19 2032 06/08/19 2357 06/09/19 0428 06/09/19 0837  BP: 120/76 118/63 (!) 142/85 109/74  Pulse: 95 84 (!) 103 91  Resp: 18 18 18 18   Temp: 98.5 F (36.9 C) 97.6 F (36.4 C) 97.8 F (36.6 C) 97.9 F (36.6 C)  TempSrc:    Axillary  SpO2: 94% 97% 96% 95%  Weight:        Intake/Output Summary (Last 24 hours) at 06/09/2019 1019 Last data filed at 06/09/2019 0504 Gross per 24 hour  Intake --  Output 650 ml  Net -650 ml   Filed Weights   06/05/19 0349  Weight: 68.3 kg    Examination:  General exam: Appears calm, not in any distress.  Comfortable.  Chronically sick looking.  Follows commands.  Anxious and emotional.   Respiratory system: Clear to auscultation. Respiratory effort normal.  No added sound.  Pacemaker in place. Cardiovascular system: S1 & S2 heard, RRR.   Gastrointestinal system: Abdomen is nondistended, soft and nontender.  Bowel sounds present.  NG tube in place. Central nervous system:  Left upper and lower extremities normal. right facial droop. Dysphonia, difficulty understanding words, however he is able to understand most of the things and following commands. Right upper extremity 3-4/5 Right lower extremity 4/5 Psychiatry: Judgement and insight appear compromised. Mood & affect flat and anxious.  Tearful.    Data Reviewed: I have personally reviewed following labs and imaging studies  CBC: Recent Labs  Lab 06/03/19 0243 06/04/19 0453 06/05/19 0527 06/08/19 0805  WBC 14.5* 10.7* 11.4* 11.4*  NEUTROABS  --   --   --  8.8*  HGB 13.8 13.7 14.5 13.8  HCT 41.8 41.3 42.5 40.9  MCV 101.5* 103.0* 101.9*  101.5*  PLT 181 194 227 [id number]   Basic Metabolic Panel: Recent Labs  Lab 06/02/19 1530 06/03/19 0243 06/04/19 0453 06/05/19 0527 06/08/19 0805  NA 138 134* 141 141 139  K 3.8 4.1 4.0 3.9 4.0  CL 102 101 103 105 106  CO2 26 26 29 27 24   GLUCOSE 134* 125* 127* 105* 110*  BUN 25* 30* 34* 31* 30*  CREATININE 0.65 0.82 0.80 0.87 0.78  CALCIUM 8.8* 8.6* 8.7* 8.5* 8.2*  MG  --   --   --   --  2.1  PHOS  --   --   --   --  3.5   GFR: Estimated Creatinine Clearance: 61.7 mL/min (by C-G formula based on SCr of 0.78 mg/dL). Liver Function Tests: Recent Labs  Lab 06/02/19 1530 06/08/19 0805  AST 149* 125*  ALT 170* 174*  ALKPHOS 136* 137*  BILITOT 1.7* 1.2  PROT 5.8* 5.2*  ALBUMIN 2.7* 2.3*   No results for input(s): LIPASE, AMYLASE in the last 168 hours. No results for input(s): AMMONIA in the last 168 hours. Coagulation Profile:  No results for input(s): INR, PROTIME in the last 168 hours. Cardiac Enzymes: No results for input(s): CKTOTAL, CKMB, CKMBINDEX, TROPONINI in the last 168 hours. BNP (last 3 results) No results for input(s): PROBNP in the last 8760 hours. HbA1C: No results for input(s): HGBA1C in the last 72 hours. CBG: Recent Labs  Lab 06/08/19 1206 06/08/19 2028 06/09/19 0033 06/09/19 0445 06/09/19 0834  GLUCAP 122* 111* 121* 136* 113*   Lipid Profile: No results for input(s): CHOL, HDL, LDLCALC, TRIG, CHOLHDL, LDLDIRECT in the last 72 hours. Thyroid Function Tests: No results for input(s): TSH, T4TOTAL, FREET4, T3FREE, THYROIDAB in the last 72 hours. Anemia Panel: No results for input(s): VITAMINB12, FOLATE, FERRITIN, TIBC, IRON, RETICCTPCT in the last 72 hours. Sepsis Labs: Recent Labs  Lab 06/02/19 1530 06/02/19 1629  PROCALCITON 0.16  --   LATICACIDVEN 1.7 2.0*    Recent Results (from the past 240 hour(s))  Culture, blood (x 2)     Status: None   Collection Time: 06/02/19  3:30 PM   Specimen: BLOOD LEFT HAND  Result Value Ref Range Status    Specimen Description BLOOD LEFT HAND  Final   Special Requests   Final    BOTTLES DRAWN AEROBIC AND ANAEROBIC Blood Culture adequate volume   Culture   Final    NO GROWTH 5 DAYS Performed at [facility], [place] [address] [place], [place] [address]    Report Status 06/07/2019 FINAL  Final  Culture, blood (x 2)     Status: None   Collection Time: 06/02/19  3:30 PM   Specimen: BLOOD RIGHT HAND  Result Value Ref Range Status   Specimen Description BLOOD RIGHT HAND  Final   Special Requests   Final    BOTTLES DRAWN AEROBIC AND ANAEROBIC Blood Culture results may not be optimal due to an inadequate volume of blood received in culture bottles   Culture   Final    NO GROWTH 5 DAYS Performed at [facility], [place] [address] [place], [place] [address]    Report Status 06/07/2019 FINAL  Final  Urine culture     Status: Abnormal   Collection Time: 06/03/19  1:39 PM   Specimen: Urine, Catheterized  Result Value Ref Range Status   Specimen Description URINE, CATHETERIZED  Final   Special Requests NONE  Final   Culture (A)  Final    >=100,000 COLONIES/mL AEROCOCCUS VIRIDANS Standardized susceptibility testing for this organism is not available. Performed at [facility], [place] [address] [place], [place] [address]    Report Status 06/05/2019 FINAL  Final         Radiology Studies: No results found.      Scheduled Meds: . amoxicillin  500 mg Per Tube Q8H  . apixaban  5 mg Per Tube BID  . aspirin  81 mg Per Tube Daily  . doxazosin  1 mg Per Tube Daily  . feeding supplement (PRO-STAT SUGAR FREE 64)  30 mL Per Tube BID  . free water  125 mL Per Tube Q4H  . multivitamin  15 mL Per Tube Daily  . polyethylene glycol  17 g Per Tube Daily  . simvastatin  40 mg Per Tube q1800  . sodium chloride  1 spray Each Nare QID   Continuous Infusions: . feeding supplement (OSMOLITE 1.2 CAL) 1,000 mL (06/08/19 0114)     LOS: 12 days    Time spent: 30 minutes    Barb Merino, MD [facility]  Pager [phone]

## 2019-06-09 NOTE — Consult Note (Signed)
[facility] CM Inpatient Consult   06/09/2019  William Mann 12/05/1929 [id number]   Patient screened for risk score for unplanned readmission score  and for hospitalizations to check if potential [facility] Care Management services are needed.   Plan:  Follow up for  progress and disposition to assess for post hospital care management needs.    Please place a [facility] Care Management consult as appropriate and for questions contact:   Charlesetta Shanks, RN BSN CCM [facility]  [phone] business mobile phone Toll free office [phone]  Fax number: [phone] [email or web address]

## 2019-06-10 DIAGNOSIS — Z66 Do not resuscitate: Secondary | ICD-10-CM

## 2019-06-10 DIAGNOSIS — E43 Unspecified severe protein-calorie malnutrition: Secondary | ICD-10-CM

## 2019-06-10 LAB — GLUCOSE, CAPILLARY
Glucose-Capillary: 105 mg/dL — ABNORMAL HIGH (ref 70–99)
Glucose-Capillary: 106 mg/dL — ABNORMAL HIGH (ref 70–99)
Glucose-Capillary: 109 mg/dL — ABNORMAL HIGH (ref 70–99)
Glucose-Capillary: 120 mg/dL — ABNORMAL HIGH (ref 70–99)
Glucose-Capillary: 124 mg/dL — ABNORMAL HIGH (ref 70–99)
Glucose-Capillary: 140 mg/dL — ABNORMAL HIGH (ref 70–99)

## 2019-06-10 NOTE — Progress Notes (Signed)
Inpatient Rehabilitation Admissions Coordinator  I met with patient 's wife at bedside. Patient is asleep. Patient currently not at a level to tolerate the intensity of an inpt rehab admit.   Danne Baxter, RN, MSN Rehab Admissions Coordinator [phone] 06/10/2019 2:10 PM

## 2019-06-10 NOTE — Progress Notes (Signed)
Patient remained in bed this shift. Bed alarm on, bed low and mats to floor. Oral care provided. Tube feeding infusing via bridled NGT, tolerating well. Condom cath in place, urine documented. Patient with no s/sx of any pain. Call bell in reach.

## 2019-06-10 NOTE — Progress Notes (Signed)
PROGRESS NOTE    Bodie Abernethy  SHF:[id number] DOB: 1929-12-15 DOA: 05/28/2019 PCP: Patient, No Pcp Per    Brief Narrative:  86 year old gentleman with history of peripheral vascular disease, hypertension, hyperlipidemia, DVT on Eliquis, permanent pacemaker, carotid stenosis status post right carotid endarterectomy, COVID-19 infection in January 2021 presented to the emergency room with slurred speech and facial droop as well as recurrent fall.  Initially presented to the emergency room on 4/20 with fall, negative neuro exam and was sent home.  Subsequently developed slurred speech, unsteady gait, right leg weakness right facial droop and coughing and choking after eating so brought back to the ER.  An MRI showed small acute infarct in the left basal ganglia/corona radiata and right thalamus.    Remained in the hospital with fluctuating mental status.   Pending decision to go to CIR vs SNF   Assessment & Plan:   Active Problems:   Stroke (cerebrum) (HCC)   CVA (cerebral vascular accident) ([place])   Coronary artery disease involving native coronary artery of native heart without angina pectoris   Deep venous thrombosis (HCC)   Fatty liver   Dementia without behavioral disturbance (HCC)   Tachycardia   Hypertension   Leukocytosis   Dysphagia due to recent cerebral infarction   Hemiplegia as late effect of cerebral infarction [facility])   Advanced care planning/counseling discussion   Goals of care, counseling/discussion   Apraxia due to acute stroke [facility])   Palliative care by specialist   Protein-calorie malnutrition, severe   DNR (do not resuscitate)   Palliative care encounter  Acute bilateral ischemic stroke: Left basal ganglia stroke, right thalamic infarct secondary to large vessel disease: Clinical findings, right facial droop, right hemiplegia and dysphagia.  Altered mental status. CT head findings, initially negative.  Follow-up CT scan with new periventricular infarct MRI  of the brain, left basal ganglia and right thalamic infarcts.  Brain atrophy. Repeat MRI 4/27, slight increase in size of left basal ganglia stroke, evaluation of right thalamic infarcts. CT of the head and neck, no large vessel occlusion.  Left ICA plaque with 50% stenosis.  Right CEA patent.  Bilateral PCA moderate to severe stenosis. 2D echocardiogram, EF 55%.  Grade 1 diastolic dysfunction.  No emboli. Antiplatelet therapy, on aspirin 81 mg and Eliquis 5 mg twice a day before event, continued LDL, 160 statin, Zocor increased from 10-40 mg  Hemoglobin A1c, 5.  No indication for treatment. Therapy recommendations, acute inpatient rehab vs SNF pending clinical improvement.  Dysphagia: Due to acute stroke.  Unable to swallow.  Core track tube pulled out 4/29 morning. Reinserted 4/30.  Also continue follow-up with speech therapy. Repeat Swallow study with no much improvement. Waiting for rehab determination whether patient will need a PEG tube.  Intracranial stenosis: Medical management.  Avoid low blood pressure.  Acute UTI: Unknown present on admission.  Patient growing more than 100,000 colonies of Aerococcus.  Treated with amoxicillin.  Ethics Barrie Folk of care:  Palliative care consulted to discuss, coordinate and counseled patient and family. Inserted NG tube and started nutrition. If he is not able to swallow safely and unable to go to CIR, Given his difficulty swallowing condition, he will either need a PEG tube and skilled nursing facility placement or hospice.  Today, met with patient's wife and son at the bedside, they are considering PEG tube placement in nursing home placement if unable to go to rehab.   DVT prophylaxis: Eliquis Code Status: DNR Family Communication: Son and wife at  the bedside. Disposition Plan: Status is: Inpatient  Remains inpatient appropriate because:Inpatient level of care appropriate due to severity of illness   Dispo: The patient is from: Home               Anticipated d/c is to: CIR versus SNF.              Anticipated d/c date is: Unknown.              Patient currently stable only to go to acute inpatient rehab.   Consultants:   Neurology  Palliative medicine  Procedures:   None  Antimicrobials:   None   Subjective: Seen and examined.  Responds to verbal stimuli.  Looks comfortable.  Family at the bedside.  No overnight events.   Objective: Vitals:   06/09/19 1945 06/10/19 0005 06/10/19 0341 06/10/19 0752  BP: 136/78 123/82 124/74 103/62  Pulse: 91 (!) 104 95 82  Resp: _0 Temp: 98.3 F (36.8 C) 97.8 F (36.6 C) 98.2 F (36.8 C) 97.8 F (36.6 C)  TempSrc: Oral Oral Oral Oral  SpO2: 97% 97% 99% 96%  Weight:        Intake/Output Summary (Last 24 hours) at 06/10/2019 1108 Last data filed at 06/10/2019 0600 Gross per 24 hour  Intake 1035 ml  Output 650 ml  Net 385 ml   Filed Weights   06/05/19 0349  Weight: 68.3 kg    Examination:  General exam: Appears calm, not in any distress.  Comfortable.  Chronically sick looking.  Follows commands.   Respiratory system: Clear to auscultation. Respiratory effort normal.  No added sound.  Pacemaker in place. Cardiovascular system: S1 & S2 heard, RRR.   Gastrointestinal system: Abdomen is nondistended, soft and nontender.  Bowel sounds present.  NG tube in place. Central nervous system:  Left upper and lower extremities normal. right facial droop. Dysphonia, difficulty understanding words, however he is able to understand most of the things and following commands. Right upper extremity 3-4/5 Right lower extremity 4/5 Psychiatry: Judgement and insight appear compromised. Mood & affect flat and anxious.      Data Reviewed: I have personally reviewed following labs and imaging studies  CBC: Recent Labs  Lab 06/04/19 0453 06/05/19 0527 06/08/19 0805  WBC 10.7* 11.4* 11.4*  NEUTROABS  --   --  8.8*  HGB 13.7 14.5 13.8  HCT 41.3 42.5 40.9  MCV  103.0* 101.9* 101.5*  PLT 194 227 426   Basic Metabolic Panel: Recent Labs  Lab 06/04/19 0453 06/05/19 0527 06/08/19 0805  NA 141 141 139  K 4.0 3.9 4.0  CL 103 105 106  CO2 _1 GLUCOSE 127* 105* 110*  BUN 34* 31* 30*  CREATININE 0.80 0.87 0.78  CALCIUM 8.7* 8.5* 8.2*  MG  --   --  2.1  PHOS  --   --  3.5   GFR: Estimated Creatinine Clearance: 61.7 mL/min (by C-G formula based on SCr of 0.78 mg/dL). Liver Function Tests: Recent Labs  Lab 06/08/19 0805  AST 125*  ALT 174*  ALKPHOS 137*  BILITOT 1.2  PROT 5.2*  ALBUMIN 2.3*   No results for input(s): LIPASE, AMYLASE in the last 168 hours. No results for input(s): AMMONIA in the last 168 hours. Coagulation Profile: No results for input(s): INR, PROTIME in the last 168 hours. Cardiac Enzymes: No results for input(s): CKTOTAL, CKMB, CKMBINDEX, TROPONINI in the last 168 hours. BNP (last 3 results) No results  for input(s): PROBNP in the last 8760 hours. HbA1C: No results for input(s): HGBA1C in the last 72 hours. CBG: Recent Labs  Lab 06/09/19 1659 06/09/19 1944 06/10/19 0004 06/10/19 0340 06/10/19 0752  GLUCAP 140* 122* 124* 120* 106*   Lipid Profile: No results for input(s): CHOL, HDL, LDLCALC, TRIG, CHOLHDL, LDLDIRECT in the last 72 hours. Thyroid Function Tests: No results for input(s): TSH, T4TOTAL, FREET4, T3FREE, THYROIDAB in the last 72 hours. Anemia Panel: No results for input(s): VITAMINB12, FOLATE, FERRITIN, TIBC, IRON, RETICCTPCT in the last 72 hours. Sepsis Labs: No results for input(s): PROCALCITON, LATICACIDVEN in the last 168 hours.  Recent Results (from the past 240 hour(s))  Culture, blood (x 2)     Status: None   Collection Time: 06/02/19  3:30 PM   Specimen: BLOOD LEFT HAND  Result Value Ref Range Status   Specimen Description BLOOD LEFT HAND  Final   Special Requests   Final    BOTTLES DRAWN AEROBIC AND ANAEROBIC Blood Culture adequate volume   Culture   Final    NO GROWTH 5  DAYS Performed at [facility], [address] [place], [place] [address]    Report Status 06/07/2019 FINAL  Final  Culture, blood (x 2)     Status: None   Collection Time: 06/02/19  3:30 PM   Specimen: BLOOD RIGHT HAND  Result Value Ref Range Status   Specimen Description BLOOD RIGHT HAND  Final   Special Requests   Final    BOTTLES DRAWN AEROBIC AND ANAEROBIC Blood Culture results may not be optimal due to an inadequate volume of blood received in culture bottles   Culture   Final    NO GROWTH 5 DAYS Performed at [facility], [place] [address], [place], [place] [address]    Report Status 06/07/2019 FINAL  Final  Urine culture     Status: Abnormal   Collection Time: 06/03/19  1:39 PM   Specimen: Urine, Catheterized  Result Value Ref Range Status   Specimen Description URINE, CATHETERIZED  Final   Special Requests NONE  Final   Culture (A)  Final    >=100,000 COLONIES/mL AEROCOCCUS VIRIDANS Standardized susceptibility testing for this organism is not available. Performed at [facility], [place] [address] [place], [place] [address]    Report Status 06/05/2019 FINAL  Final         Radiology Studies: DG Swallowing Func-Speech Pathology  Result Date: 06/09/2019 Objective Swallowing Evaluation: Type of Study: MBS-Modified Barium Swallow Study  Patient Details Name: Margarita Bobrowski MRN: [id number] Date of Birth: Dec 13, 1929 Today's Date: 06/09/2019 Time: SLP Start Time (ACUTE ONLY): 1300 -SLP Stop Time (ACUTE ONLY): 1330 SLP Time Calculation (min) (ACUTE ONLY): 30 min Past Medical History: Past Medical History: Diagnosis Date . Aortic atherosclerosis ([place])  . Bilateral hearing loss  . BPH (benign prostatic hyperplasia)  . Cancer ([place])   basal cell carcinoma . Carotid artery occlusion  . Constipation  . Coronary artery disease  . COVID-19  . DVT (deep venous thrombosis) ([place])  . Emphysema lung ([place])  . Fatty liver  . GERD (gastroesophageal reflux disease)  . History of  echocardiogram   Echo 4/17: Vigorous LVEF, EF 65-70%, normal wall motion, grade 1 diastolic dysfunction, mildly dilated ascending aorta (40 mm) . Hyperlipidemia  . Hypertension  . Insomnia  . PVD (peripheral vascular disease) ([place])  . Thyroid nodule  Past Surgical History: Past Surgical History: Procedure Laterality Date . APPENDECTOMY   .  CAROTID ENDARTERECTOMY  03-05-09  right CEA . COLONOSCOPY  Oct. 2013  one polyp . HERNIA REPAIR   . LIPOMA EXCISION   . PACEMAKER IMPLANT N/A 12/23/2018  Procedure: PACEMAKER IMPLANT;  Surgeon: Evans Lance, MD;  Location: [facility];  Service: Cardiovascular;  Laterality: N/A; HPI: Nilton Lave is a 86 y.o. male with medical history significant of thyroid nodule, emphysema, GERD, right carotid endarterectomy,PVD, hypertension, hyperlipidemia, lower extremity DVT, bilateral hearing loss, presented with new onset of slurred speech and facial droop. Per chart wife also noticed significant right leg weakness, right-sided facial droop, and cough/choking after eating drink since last night. MRI shows small acute infarcts in the left basal ganglia/corona radiata and right thalamus. Pt with increasing lethargy prompting repeat MRI on 4/27 - this revealed extension of the left basal ganglia/radiating white matter tract infarction which is larger than on the initial presentation, measuring up to 2 cm in size presently.  Subjective: alert Assessment / Plan / Recommendation CHL IP CLINICAL IMPRESSIONS 06/09/2019 Clinical Impression Pt continues to present with a moderate-severe oropharyngeal dysphagia,  Oral phase is marked by poor control/cohesion with right sulci residual, anterior spill and delayed and inefficient transfer. Propulsion into pharynx was weak and incomplete, with portion of bolus remaining in oral cavity.  Mobility of larynx was limited, and there was minimal movement of epiglottis into horizontal and incomplete inversion over larynx.  Incomplete laryngeal  vestibule closure led to immediate aspiration of teaspoon boluses of thin liquids, evoking a cough response.  Nectar, honey, and pureed barium tended to pool in valleculae and pyriforms with reduced transfer through UES due to impaired pharyngeal squeeze.  It is difficult to discern any meaningful improvement in swallow function.  There remains significant oral and pharyngeal residue after the swallow, and all these materials, even the thickest, are at risk of being aspirated after the swallow with accumulation of boluses. Recommend continuing NPO status for now; pt currently has a cortrak for nutrition.  I anticipate recovery of swallow function could take quite some time.  SLP will continue to follow for therapeutic POs (nectar or honey thick liquids).  Continue conservative offering of ice chips (after oral care) to mobilize musculature and help maintain health of oral mucosa.  SLP Visit Diagnosis Dysphagia, oropharyngeal phase (R13.12) Attention and concentration deficit following -- Frontal lobe and executive function deficit following -- Impact on safety and function Severe aspiration risk;Moderate aspiration risk   CHL IP TREATMENT RECOMMENDATION 06/09/2019 Treatment Recommendations Therapy as outlined in treatment plan below   Prognosis 06/09/2019 Prognosis for Safe Diet Advancement Guarded Barriers to Reach Goals -- Barriers/Prognosis Comment -- CHL IP DIET RECOMMENDATION 06/09/2019 SLP Diet Recommendations NPO;Ice chips PRN after oral care Liquid Administration via -- Medication Administration -- Compensations -- Postural Changes --   CHL IP OTHER RECOMMENDATIONS 06/09/2019 Recommended Consults -- Oral Care Recommendations Oral care QID Other Recommendations --   CHL IP FOLLOW UP RECOMMENDATIONS 06/09/2019 Follow up Recommendations Inpatient Rehab   CHL IP FREQUENCY AND DURATION 06/09/2019 Speech Therapy Frequency (ACUTE ONLY) min 2x/week Treatment Duration --      CHL IP ORAL PHASE 06/09/2019 Oral Phase Impaired Oral -  Pudding Teaspoon -- Oral - Pudding Cup -- Oral - Honey Teaspoon Weak lingual manipulation;Lingual pumping;Reduced posterior propulsion;Holding of bolus;Right pocketing in lateral sulci;Lingual/palatal residue;Piecemeal swallowing;Delayed oral transit;Decreased bolus cohesion Oral - Honey Cup NT Oral - Nectar Teaspoon Weak lingual manipulation;Lingual pumping;Reduced posterior propulsion;Holding of bolus;Right pocketing in lateral sulci;Lingual/palatal residue;Piecemeal swallowing;Delayed oral transit;Decreased bolus  cohesion Oral - Nectar Cup NT Oral - Nectar Straw -- Oral - Thin Teaspoon Weak lingual manipulation;Lingual pumping;Reduced posterior propulsion;Holding of bolus;Right pocketing in lateral sulci;Lingual/palatal residue;Piecemeal swallowing;Delayed oral transit;Decreased bolus cohesion Oral - Thin Cup -- Oral - Thin Straw -- Oral - Puree Weak lingual manipulation;Lingual pumping;Reduced posterior propulsion;Holding of bolus;Right pocketing in lateral sulci;Lingual/palatal residue;Piecemeal swallowing;Delayed oral transit;Decreased bolus cohesion Oral - Mech Soft -- Oral - Regular -- Oral - Multi-Consistency -- Oral - Pill -- Oral Phase - Comment --  CHL IP PHARYNGEAL PHASE 06/09/2019 Pharyngeal Phase Impaired Pharyngeal- Pudding Teaspoon -- Pharyngeal -- Pharyngeal- Pudding Cup -- Pharyngeal -- Pharyngeal- Honey Teaspoon Delayed swallow initiation-pyriform sinuses;Reduced pharyngeal peristalsis;Reduced epiglottic inversion;Reduced anterior laryngeal mobility;Reduced laryngeal elevation;Reduced tongue base retraction;Pharyngeal residue - valleculae;Pharyngeal residue - pyriform Pharyngeal -- Pharyngeal- Honey Cup NT Pharyngeal -- Pharyngeal- Nectar Teaspoon Delayed swallow initiation-pyriform sinuses;Reduced pharyngeal peristalsis;Reduced epiglottic inversion;Reduced anterior laryngeal mobility;Reduced laryngeal elevation;Reduced tongue base retraction;Pharyngeal residue - valleculae;Pharyngeal residue -  pyriform Pharyngeal -- Pharyngeal- Nectar Cup NT Pharyngeal -- Pharyngeal- Nectar Straw -- Pharyngeal -- Pharyngeal- Thin Teaspoon Delayed swallow initiation-pyriform sinuses;Reduced pharyngeal peristalsis;Reduced epiglottic inversion;Reduced anterior laryngeal mobility;Reduced laryngeal elevation;Reduced tongue base retraction;Pharyngeal residue - valleculae;Pharyngeal residue - pyriform;Penetration/Aspiration during swallow;Penetration/Aspiration before swallow;Trace aspiration Pharyngeal Material enters airway, passes BELOW cords without attempt by patient to eject out (silent aspiration);Material enters airway, passes BELOW cords and not ejected out despite cough attempt by patient Pharyngeal- Thin Cup -- Pharyngeal -- Pharyngeal- Thin Straw -- Pharyngeal -- Pharyngeal- Puree Delayed swallow initiation-pyriform sinuses;Reduced pharyngeal peristalsis;Reduced epiglottic inversion;Reduced anterior laryngeal mobility;Reduced laryngeal elevation;Reduced tongue base retraction;Pharyngeal residue - valleculae;Pharyngeal residue - pyriform;Delayed swallow initiation-vallecula Pharyngeal -- Pharyngeal- Mechanical Soft -- Pharyngeal -- Pharyngeal- Regular -- Pharyngeal -- Pharyngeal- Multi-consistency -- Pharyngeal -- Pharyngeal- Pill -- Pharyngeal -- Pharyngeal Comment --  No flowsheet data found. [name] 06/09/2019, 4:39 PM                   Scheduled Meds: . amoxicillin  500 mg Per Tube Q8H  . apixaban  5 mg Per Tube BID  . aspirin  81 mg Per Tube Daily  . doxazosin  1 mg Per Tube Daily  . feeding supplement (PRO-STAT SUGAR FREE 64)  30 mL Per Tube BID  . free water  125 mL Per Tube Q4H  . multivitamin  15 mL Per Tube Daily  . polyethylene glycol  17 g Per Tube Daily  . simvastatin  40 mg Per Tube q1800  . sodium chloride  1 spray Each Nare QID   Continuous Infusions: . feeding supplement (OSMOLITE 1.2 CAL) 60 mL/hr at 06/10/19 0600     LOS: 13 days    Time spent: 30  minutes    Barb Merino, MD [facility] Pager [phone]

## 2019-06-10 NOTE — Progress Notes (Signed)
Physical Therapy Treatment Patient Details Name: William Mann MRN: [id number] DOB: 1930-01-02 Today's Date: 06/10/2019    History of Present Illness [name] is a 86 y.o. male with history of  R ICA stenosis s/p R CEA, PVD, hypertension, hyperlipidemia, lower extremity DVT on Eliquis, carotid artery occlusion, atherosclerosis and bilateral hearing loss presenting with dysarthria, facial droop, R leg weakness.  Found to have L BG/CR and R PLIC/thalamic infarcts. Repeat CT 4/26 and MRI 4/27 revealed extension of the left basal ganglia/radiating white matter tract infarction.    PT Comments    Patient easily arouses and motivated to participate with therapies despite his weakened condition. His standing today was stronger (continues to require +2 moderate assist from elevated bed), however again resulted in episode of incontinence of liquid stool. Required return to supine and working on rolling as cleaned pt and changed linens. Anticipate continued slow progress and likely inability to participate in 3 hours of therapy at this time. If he improves at SNF, could possibly consider return to CIR at that time?    Follow Up Recommendations  Supervision/Assistance - 24 hour;SNF(anticipate continued slow progress)     Equipment Recommendations  None recommended by PT(defer to post acute)    Recommendations for Other Services       Precautions / Restrictions Precautions Precautions: Fall Restrictions Weight Bearing Restrictions: No    Mobility  Bed Mobility Overal bed mobility: Needs Assistance Bed Mobility: Rolling;Sidelying to Sit;Sit to Sidelying Rolling: Max assist;+2 for physical assistance Sidelying to sit: Mod assist;+2 for physical assistance;+2 for safety/equipment;HOB elevated     Sit to sidelying: Total assist;+2 for physical assistance General bed mobility comments: assisted with rolling to his right and moving legs over EOB; assisted with raising torso,  however total assist to scoot out to EOB to reach feet to floor; on return to bed, multiple rolls rt and lt for cleaning and changing linen  Transfers Overall transfer level: Needs assistance   Transfers: Sit to/from Stand Sit to Stand: Mod assist;+2 physical assistance;From elevated surface         General transfer comment: from elevated bed with use of bed pad under pelvis and pt holding onto back of locked recliner in front of him (using LUE); 1st attempt with better extension of knees (very close to fully straight) able to raise his head and extend hips/back to more upright posture and stood ~60 seconds; 2nd attempt much shorter duration due to incontinence of bowels  Ambulation/Gait                 Stairs             Wheelchair Mobility    Modified Rankin (Stroke Patients Only) Modified Rankin (Stroke Patients Only) Pre-Morbid Rankin Score: Moderate disability Modified Rankin: Severe disability     Balance Overall balance assessment: Needs assistance Sitting-balance support: Feet supported;Bilateral upper extremity supported Sitting balance-Leahy Scale: Poor Sitting balance - Comments: pt able to maintain sitting balance with min guard for safety and cues for righting posture to midline   Standing balance support: Single extremity supported Standing balance-Leahy Scale: Poor Standing balance comment: slowly began to lean to his rt with fatigue                            Cognition Arousal/Alertness: Lethargic(but improves with sitting, standing) Behavior During Therapy: Flat affect Overall Cognitive Status: Impaired/Different from baseline Area of Impairment: Following commands;Attention;Safety/judgement;Awareness  Current Attention Level: Sustained   Following Commands: Follows one step commands with increased time Safety/Judgement: Decreased awareness of deficits;Decreased awareness of safety Awareness:  Intellectual   General Comments: easily aroused and nodding head yes to therapy/activity; answers questions with head nods and occasional single words;       Exercises      General Comments General comments (skin integrity, edema, etc.): wife present      Pertinent Vitals/Pain Pain Assessment: Faces Faces Pain Scale: No hurt    Home Living                      Prior Function            PT Goals (current goals can now be found in the care plan section) Acute Rehab PT Goals Patient Stated Goal: to get stronger Time For Goal Achievement: 06/12/19 Potential to Achieve Goals: Fair Progress towards PT goals: Progressing toward goals(slowly)    Frequency    Min 4X/week      PT Plan Discharge plan needs to be updated    Co-evaluation              AM-PAC PT "6 Clicks" Mobility   Outcome Measure  Help needed turning from your back to your side while in a flat bed without using bedrails?: A Lot Help needed moving from lying on your back to sitting on the side of a flat bed without using bedrails?: A Lot Help needed moving to and from a bed to a chair (including a wheelchair)?: A Lot Help needed standing up from a chair using your arms (e.g., wheelchair or bedside chair)?: A Lot Help needed to walk in hospital room?: Total Help needed climbing 3-5 steps with a railing? : Total 6 Click Score: 10    End of Session Equipment Utilized During Treatment: Gait belt Activity Tolerance: Treatment limited secondary to medical complications (Comment)(incontinent of bowels) Patient left: with call bell/phone within reach;in bed;with bed alarm set;with family/visitor present Nurse Communication: Mobility status PT Visit Diagnosis: Other abnormalities of gait and mobility (R26.89);Muscle weakness (generalized) (M62.81);Other symptoms and signs involving the nervous system (R29.898)     Time: [id number] PT Time Calculation (min) (ACUTE ONLY): 46 min  Charges:   $Therapeutic Activity: 8-22 mins $Neuromuscular Re-education: 23-37 mins                      Arby Barrette, PT Pager [phone]    Rexanne Mano 06/10/2019, 5:38 PM

## 2019-06-10 NOTE — Progress Notes (Signed)
Daily Progress Note   Patient Name: William Mann       Date: 06/10/2019 DOB: 1929-06-11  Age: 86 y.o. MRN#: [id number] Attending Physician: Barb Merino, MD Primary Care Physician: Patient, No Pcp Per Admit Date: 05/28/2019  Reason for Consultation/Follow-up: Establishing goals of care  Subjective: Patient in bed, somnolent. Met with Polly and Rick.  Reviewed and discussed MOST form, completed it with them. Discussed patient with PT- they initially recommended SNF- however, CIR care manager reached out and asked them to re-eval for CIR- they are going to visit him later in the day for further evaluation.  Answered family's questions relating to discharge options and feeding tubes. They are aware that patient can go to CIR with [facility], however, will need permanent PEG prior to SNF- they are also aware of likely delay getting PEG due to national shortage.    Review of Systems  Unable to perform ROS: Acuity of condition    Length of Stay: 13  Current Medications: Scheduled Meds:  . amoxicillin  500 mg Per Tube Q8H  . apixaban  5 mg Per Tube BID  . aspirin  81 mg Per Tube Daily  . doxazosin  1 mg Per Tube Daily  . feeding supplement (PRO-STAT SUGAR FREE 64)  30 mL Per Tube BID  . free water  125 mL Per Tube Q4H  . multivitamin  15 mL Per Tube Daily  . polyethylene glycol  17 g Per Tube Daily  . simvastatin  40 mg Per Tube q1800  . sodium chloride  1 spray Each Nare QID    Continuous Infusions: . feeding supplement (OSMOLITE 1.2 CAL) 60 mL/hr at 06/10/19 0600    PRN Meds: acetaminophen **OR** acetaminophen (TYLENOL) oral liquid 160 mg/5 mL **OR** acetaminophen, zolpidem  Physical Exam Vitals and nursing note reviewed.  Constitutional:      Comments: lethargic       Vital Signs: BP 102/63 (BP Location: Left Arm)   Pulse 98   Temp 98 F (36.7 C) (Oral)   Resp 18   Wt 68.3 kg   SpO2 96%   BMI 22.89 kg/m  SpO2: SpO2: 96 % O2 Device: O2 Device: Room Air O2 Flow Rate:    Intake/output summary:   Intake/Output Summary (Last 24 hours) at 06/10/2019 1153 Last data filed at 06/10/2019  0600 Gross per 24 hour  Intake 1035 ml  Output 650 ml  Net 385 ml   LBM: Last BM Date: 06/09/19(per rpt) Baseline Weight: Weight: 68.3 kg Most recent weight: Weight: 68.3 kg       Palliative Assessment/Data: PPS: 10%   Flowsheet Rows     Most Recent Value  Intake Tab  Referral Department  Hospitalist  Unit at Time of Referral  Intermediate Care Unit  Palliative Care Primary Diagnosis  Neurology  Date Notified  06/03/19  Palliative Care Type  New Palliative care  Reason for referral  Clarify Goals of Care  Date of Admission  05/28/19  Date first seen by Palliative Care  06/04/19  # of days Palliative referral response time  1 Day(s)  # of days IP prior to Palliative referral  6  Clinical Assessment  Psychosocial & Spiritual Assessment  Palliative Care Outcomes      Patient Active Problem List   Diagnosis Date Noted  . Protein-calorie malnutrition, severe 06/06/2019  . DNR (do not resuscitate)   . Palliative care encounter   . Dysphagia due to recent cerebral infarction   . Hemiplegia as late effect of cerebral infarction ([place])   . Advanced care planning/counseling discussion   . Goals of care, counseling/discussion   . 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 Palliative care by specialist   . Coronary artery disease involving native coronary artery of native heart without angina pectoris   . Deep venous thrombosis ([place])   . Fatty liver   . Dementia without behavioral disturbance ([place])   . Tachycardia   . Hypertension   . Leukocytosis   . Stroke (cerebrum) ([place]) 05/28/2019  . CVA (cerebral vascular accident) ([place]) 05/28/2019  . Pacemaker  03/24/2019  . Complete heart block ([place]) 12/20/2018  . Aftercare following surgery of the circulatory system 02/17/2014  . Bilateral carotid artery occlusion 02/17/2014  . Aftercare following surgery of the circulatory system, [place] 01/17/2013  . Numbness and tingling 02/27/2012  . Peripheral vascular disease, unspecified ([place]) 02/27/2012  . Occlusion and stenosis of carotid artery without mention of cerebral infarction 03/14/2011    Palliative Care Assessment & Plan   Patient Profile: 86 y.o.malewith past medical history of HTN, hyperlipidemia, DVT on Eliquis, CAD, and right carotid stenosis s/p endarderectomyadmitted on 4/21/2021with acute ischemic stroke.Mental status worsened on 4/26 and CT showed new low density on the left concerning for acute infarction/ expansion of previous stroke. Palliative care has been consulted to discuss [place] in the setting of extension of the CVA and clinical findings ofright hemiplegia, dysarthria, and dysphagia.  Assessment/Recommendations/Plan   MOST form selections- DNR, limited medical interventions, antibiotics if indicated, IV fluids if indicated, and permanent feeding tube if indicated.  PMT will shadow and intervene if there is decline or if we are called   Goals of Care and Additional Recommendations:  Limitations on Scope of Treatment: Full Scope Treatment  Code Status:  DNR  Prognosis:   Unable to determine  Discharge Planning:  [place] for rehab with Palliative care service follow-up vs CIR  Care plan was discussed with PT, son, spouse.   Thank you for allowing the Palliative Medicine Team to assist in the care of this patient.   Time In: 1100 Time Out: 1150 Total Time 50 mins Prolonged Time Billed yes      Greater than 50%  of this time was spent counseling and coordinating care related to the above assessment and plan.  Leonard Downing  [facility]   Please contact Palliative Medicine  Team phone at [phone] for questions and concerns.

## 2019-06-11 ENCOUNTER — Inpatient Hospital Stay (HOSPITAL_COMMUNITY): Payer: Medicare Other

## 2019-06-11 LAB — GLUCOSE, CAPILLARY
Glucose-Capillary: 100 mg/dL — ABNORMAL HIGH (ref 70–99)
Glucose-Capillary: 115 mg/dL — ABNORMAL HIGH (ref 70–99)
Glucose-Capillary: 120 mg/dL — ABNORMAL HIGH (ref 70–99)
Glucose-Capillary: 120 mg/dL — ABNORMAL HIGH (ref 70–99)
Glucose-Capillary: 121 mg/dL — ABNORMAL HIGH (ref 70–99)
Glucose-Capillary: 135 mg/dL — ABNORMAL HIGH (ref 70–99)

## 2019-06-11 MED ORDER — ENOXAPARIN SODIUM 60 MG/0.6ML ~~LOC~~ SOLN
60.0000 mg | Freq: Two times a day (BID) | SUBCUTANEOUS | Status: DC
  Administered 2019-06-11 – 2019-06-13 (×4): 60 mg via SUBCUTANEOUS
  Filled 2019-06-11 (×4): qty 0.6

## 2019-06-11 NOTE — Progress Notes (Signed)
PROGRESS NOTE    William Mann  [id number] DOB: 15-Mar-1929 DOA: 05/28/2019 PCP: Patient, No Pcp Per    Brief Narrative:  86 year old gentleman with history of peripheral vascular disease, hypertension, hyperlipidemia, DVT on Eliquis, permanent pacemaker, carotid stenosis status post right carotid endarterectomy, COVID-19 infection in January 2021 presented to the emergency room with slurred speech and facial droop as well as recurrent fall.  Initially presented to the emergency room on 4/20 with fall, negative neuro exam and was sent home.  Subsequently developed slurred speech, unsteady gait, right leg weakness, right facial droop and coughing and choking after eating so brought back to the ER.  An MRI showed small acute infarct in the left basal ganglia/corona radiata and right thalamus.    Remained in the hospital with fluctuating mental status. Pending decision to go to CIR vs SNF Patient had fluctuating mental status, unable to eat and remains on core track feeding. 5/5: Patient more alert.  Been on core track feeding for 14 days, requested IR for PEG tube feeding.  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  Altered mental status. CT head findings, initially negative.  Follow-up CT scan with new periventricular infarct MRI of the brain, left basal ganglia and right thalamic infarcts.  Brain atrophy. Repeat MRI 4/27, slight increase in size of left basal ganglia stroke, evaluation of right thalamic infarcts. CT of the head and neck, no large vessel occlusion.  Left ICA plaque with 50% stenosis.  Right CEA patent.  Bilateral PCA moderate to severe stenosis. 2D echocardiogram, EF 55%.  Grade 1 diastolic dysfunction.  No emboli. Antiplatelet therapy, on aspirin 81 mg and Eliquis 5 mg twice a day before event, continued LDL, 160 statin, Zocor increased from 10-40 mg  Hemoglobin A1c, 5.  No indication for treatment. Therapy recommendations, acute inpatient rehab vs SNF pending clinical improvement.   Dysphagia: Due to acute stroke.  Repeat swallow evaluation with not much improvement. Does have core track feeding tube, 2 weeks now. IR consulted for image guided G-tube placement. Eliquis and aspirin on hold, last dose 5/4 AM.  Intracranial stenosis: Medical management.  Avoid low blood pressure.  Acute UTI: Unknown present on admission.  Patient growing more than 100,000 colonies of Aerococcus.  Treated with amoxicillin.  Ethics Bar[place] of care:  Palliative care consulted to discuss, coordinate and counseled patient and family. Inserted NG tube and started nutrition. No CPR, limited resuscitation, okay for artificial feeding tube.  DVT prophylaxis: Eliquis, on hold today. Code Status: DNR with limited intervention. Family Communication: Son and wife at the bedside. Disposition Plan: Status is: Inpatient  Remains inpatient appropriate because:Inpatient level of care appropriate due to severity of illness.  Pending clinical improvement.  Pending PEG tube placement.   Dispo: The patient is from: Home               Anticipated d/c is to: CIR versus SNF.              Anticipated d/c date is: Unknown.            Patient is not stable to discharge because of need for PEG tube placement.   Consultants:   Neurology  Palliative medicine  Procedures:   None  Antimicrobials:   None   Subjective: Patient seen and examined.  Family at the bedside.  Today he is more alert, he worked with therapies.  He was able to wave from his chair.  His voice is more clear and was able to understand few words.   Objective: Vitals:   06/10/19 1640 06/10/19 2008 06/11/19 0010 06/11/19 0343  BP: 101/65 126/63 (!) 156/88 122/78  Pulse: 70 94 (!) 108 83  Resp: (!) 22 17 18 19   Temp: 97.6 F (36.4 C) (!) 97.5 F (36.4 C) 98.7 F (37.1 C) 97.7 F (36.5 C)  TempSrc: Oral Oral  Oral  SpO2: 95% 97% 96% 96%  Weight:        Intake/Output Summary (Last 24 hours) at 06/11/2019 1159 Last data filed at 06/10/2019 1700 Gross per 24 hour  Intake 660 ml  Output 850 ml  Net -190 ml   Filed Weights   06/05/19 0349  Weight: 68.3 kg    Examination:  General exam: Appears calm, not in any distress.  Comfortable.  Sitting in chair.  Follows commands. Respiratory system: Clear to auscultation. Respiratory effort normal.  No added sound.  Pacemaker in place. Cardiovascular system: S1 & S2 heard, RRR.   Gastrointestinal system: Abdomen is nondistended, soft and nontender.  Bowel sounds present.  NG tube in place. Central nervous system:  Left upper and lower extremities normal. right facial droop. Dysphonia, however he is able to understand most of the things and following commands. Right upper extremity 3-4/5 Right lower extremity 4/5 Psychiatry: Judgement and insight appear normal.  More alert and in good mood today.    Data Reviewed: I have personally reviewed following labs and imaging studies  CBC: Recent Labs  Lab 06/05/19 0527 06/08/19 0805  WBC 11.4* 11.4*  NEUTROABS  --  8.8*  HGB 14.5 13.8  HCT  42.5 40.9  MCV 101.9* 101.5*  PLT 227 [id number]   Basic Metabolic Panel: Recent Labs  Lab 06/05/19 0527 06/08/19 0805  NA 141 139  K 3.9 4.0  CL 105 106  CO2 27 24  GLUCOSE 105* 110*  BUN 31* 30*  CREATININE 0.87 0.78  CALCIUM 8.5* 8.2*  MG  --  2.1  PHOS  --  3.5   GFR: Estimated Creatinine Clearance: 61.7 mL/min (by C-G formula based on SCr of 0.78 mg/dL). Liver Function Tests: Recent Labs  Lab 06/08/19 0805  AST 125*  ALT 174*  ALKPHOS 137*  BILITOT 1.2  PROT 5.2*  ALBUMIN 2.3*   No results for input(s): LIPASE, AMYLASE in the last 168 hours. No results for input(s): AMMONIA in the last 168 hours. Coagulation Profile: No results for input(s): INR, PROTIME in the last 168 hours. Cardiac Enzymes: No results for input(s): CKTOTAL, CKMB, CKMBINDEX, TROPONINI in the last 168 hours. BNP (last 3 results) No results for input(s): PROBNP in the last 8760 hours. HbA1C: No results for input(s): HGBA1C in the last 72 hours.  CBG: Recent Labs  Lab 06/10/19 1519 06/10/19 2005 06/11/19 0008 06/11/19 0412 06/11/19 0835  GLUCAP 140* 109* 120* 115* 135*   Lipid Profile: No results for input(s): CHOL, HDL, LDLCALC, TRIG, CHOLHDL, LDLDIRECT in the last 72 hours. Thyroid Function Tests: No results for input(s): TSH, T4TOTAL, FREET4, T3FREE, THYROIDAB in the last 72 hours. Anemia Panel: No results for input(s): VITAMINB12, FOLATE, FERRITIN, TIBC, IRON, RETICCTPCT in the last 72 hours. Sepsis Labs: No results for input(s): PROCALCITON, LATICACIDVEN in the last 168 hours.  Recent Results (from the past 240 hour(s))  Culture, blood (x 2)     Status: None   Collection Time: 06/02/19  3:30 PM   Specimen: BLOOD LEFT HAND  Result Value Ref Range Status   Specimen Description BLOOD LEFT HAND  Final   Special Requests   Final    BOTTLES DRAWN AEROBIC AND ANAEROBIC Blood Culture adequate volume   Culture   Final    NO GROWTH 5 DAYS Performed at [facility], [address] [place], [place] [address]    Report Status 06/07/2019 FINAL  Final  Culture, blood (x 2)     Status: None   Collection Time: 06/02/19  3:30 PM   Specimen: BLOOD RIGHT HAND  Result Value Ref Range Status   Specimen Description BLOOD RIGHT HAND  Final   Special Requests   Final    BOTTLES DRAWN AEROBIC AND ANAEROBIC Blood Culture results may not be optimal due to an inadequate volume of blood received in culture bottles   Culture   Final    NO GROWTH 5 DAYS Performed at [facility], [place] [address], [place], [place] [address]    Report Status 06/07/2019 FINAL  Final  Urine culture     Status: Abnormal   Collection Time: 06/03/19  1:39 PM   Specimen: Urine, Catheterized  Result Value Ref Range Status   Specimen Description URINE, CATHETERIZED  Final   Special Requests NONE  Final   Culture (A)  Final    >=100,000 COLONIES/mL AEROCOCCUS VIRIDANS Standardized susceptibility testing for this organism is not available. Performed at [facility], [place] [address], [place], [place] [address]    Report Status 06/05/2019 FINAL  Final         Radiology Studies: DG Swallowing Func-Speech Pathology  Result Date: 06/09/2019 Objective Swallowing Evaluation: Type of Study: MBS-Modified Barium Swallow Study  Patient Details Name: Dmauri Hussein MRN: [id number] Date of Birth: 08/01/1929 Today's Date: 06/09/2019 Time: SLP Start Time (ACUTE ONLY): 1300 -SLP Stop Time (ACUTE ONLY): 1330 SLP Time Calculation (min) (ACUTE ONLY): 30 min Past Medical History: Past Medical History: Diagnosis Date . Aortic atherosclerosis ([place])  . Bilateral hearing loss  . BPH (benign prostatic hyperplasia)  . Cancer ([place])   basal cell carcinoma . Carotid artery occlusion  . Constipation  . Coronary artery disease  . COVID-19  . DVT (deep venous thrombosis) ([place])  . Emphysema lung ([place])  . Fatty liver  . GERD (gastroesophageal reflux disease)  . History of echocardiogram   Echo 4/17: Vigorous LVEF, EF 65-70%,  normal wall motion, grade 1 diastolic dysfunction, mildly dilated ascending aorta (40 mm) . Hyperlipidemia  . Hypertension  . Insomnia  . PVD (peripheral vascular disease) ([place])  . Thyroid nodule  Past Surgical History: Past Surgical History: Procedure Laterality Date . APPENDECTOMY   . CAROTID ENDARTERECTOMY  03-05-09  right CEA . COLONOSCOPY  Oct. 2013  one polyp . HERNIA REPAIR   .  LIPOMA EXCISION   . PACEMAKER IMPLANT N/A 12/23/2018  Procedure: PACEMAKER IMPLANT;  Surgeon: Evans Lance, MD;  Location: [place];  Service: Cardiovascular;  Laterality: N/A; HPI: Keylor Bonifield is a 86 y.o. male with medical history significant of thyroid nodule, emphysema, GERD, right carotid endarterectomy,PVD, hypertension, hyperlipidemia, lower extremity DVT, bilateral hearing loss, presented with new onset of slurred speech and facial droop. Per chart wife also noticed significant right leg weakness, right-sided facial droop, and cough/choking after eating drink since last night. MRI shows small acute infarcts in the left basal ganglia/corona radiata and right thalamus. Pt with increasing lethargy prompting repeat MRI on 4/27 - this revealed extension of the left basal ganglia/radiating white matter tract infarction which is larger than on the initial presentation, measuring up to 2 cm in size presently.  Subjective: alert Assessment / Plan / Recommendation CHL IP CLINICAL IMPRESSIONS 06/09/2019 Clinical Impression Pt continues to present with a moderate-severe oropharyngeal dysphagia,  Oral phase is marked by poor control/cohesion with right sulci residual, anterior spill and delayed and inefficient transfer. Propulsion into pharynx was weak and incomplete, with portion of bolus remaining in oral cavity.  Mobility of larynx was limited, and there was minimal movement of epiglottis into horizontal and incomplete inversion over larynx.  Incomplete laryngeal vestibule closure led to immediate aspiration of  teaspoon boluses of thin liquids, evoking a cough response.  Nectar, honey, and pureed barium tended to pool in valleculae and pyriforms with reduced transfer through UES due to impaired pharyngeal squeeze.  It is difficult to discern any meaningful improvement in swallow function.  There remains significant oral and pharyngeal residue after the swallow, and all these materials, even the thickest, are at risk of being aspirated after the swallow with accumulation of boluses. Recommend continuing NPO status for now; pt currently has a cortrak for nutrition.  I anticipate recovery of swallow function could take quite some time.  SLP will continue to follow for therapeutic POs (nectar or honey thick liquids).  Continue conservative offering of ice chips (after oral care) to mobilize musculature and help maintain health of oral mucosa.  SLP Visit Diagnosis Dysphagia, oropharyngeal phase (R13.12) Attention and concentration deficit following -- Frontal lobe and executive function deficit following -- Impact on safety and function Severe aspiration risk;Moderate aspiration risk   CHL IP TREATMENT RECOMMENDATION 06/09/2019 Treatment Recommendations Therapy as outlined in treatment plan below   Prognosis 06/09/2019 Prognosis for Safe Diet Advancement Guarded Barriers to Reach Goals -- Barriers/Prognosis Comment -- CHL IP DIET RECOMMENDATION 06/09/2019 SLP Diet Recommendations NPO;Ice chips PRN after oral care Liquid Administration via -- Medication Administration -- Compensations -- Postural Changes --   CHL IP OTHER RECOMMENDATIONS 06/09/2019 Recommended Consults -- Oral Care Recommendations Oral care QID Other Recommendations --   CHL IP FOLLOW UP RECOMMENDATIONS 06/09/2019 Follow up Recommendations Inpatient Rehab   CHL IP FREQUENCY AND DURATION 06/09/2019 Speech Therapy Frequency (ACUTE ONLY) min 2x/week Treatment Duration --      CHL IP ORAL PHASE 06/09/2019 Oral Phase Impaired Oral - Pudding Teaspoon -- Oral - Pudding Cup -- Oral -  Honey Teaspoon Weak lingual manipulation;Lingual pumping;Reduced posterior propulsion;Holding of bolus;Right pocketing in lateral sulci;Lingual/palatal residue;Piecemeal swallowing;Delayed oral transit;Decreased bolus cohesion Oral - Honey Cup NT Oral - Nectar Teaspoon Weak lingual manipulation;Lingual pumping;Reduced posterior propulsion;Holding of bolus;Right pocketing in lateral sulci;Lingual/palatal residue;Piecemeal swallowing;Delayed oral transit;Decreased bolus cohesion Oral - Nectar Cup NT Oral - Nectar Straw -- Oral - Thin Teaspoon Weak lingual manipulation;Lingual pumping;Reduced posterior propulsion;Holding  of bolus;Right pocketing in lateral sulci;Lingual/palatal residue;Piecemeal swallowing;Delayed oral transit;Decreased bolus cohesion Oral - Thin Cup -- Oral - Thin Straw -- Oral - Puree Weak lingual manipulation;Lingual pumping;Reduced posterior propulsion;Holding of bolus;Right pocketing in lateral sulci;Lingual/palatal residue;Piecemeal swallowing;Delayed oral transit;Decreased bolus cohesion Oral - Mech Soft -- Oral - Regular -- Oral - Multi-Consistency -- Oral - Pill -- Oral Phase - Comment --  CHL IP PHARYNGEAL PHASE 06/09/2019 Pharyngeal Phase Impaired Pharyngeal- Pudding Teaspoon -- Pharyngeal -- Pharyngeal- Pudding Cup -- Pharyngeal -- Pharyngeal- Honey Teaspoon Delayed swallow initiation-pyriform sinuses;Reduced pharyngeal peristalsis;Reduced epiglottic inversion;Reduced anterior laryngeal mobility;Reduced laryngeal elevation;Reduced tongue base retraction;Pharyngeal residue - valleculae;Pharyngeal residue - pyriform Pharyngeal -- Pharyngeal- Honey Cup NT Pharyngeal -- Pharyngeal- Nectar Teaspoon Delayed swallow initiation-pyriform sinuses;Reduced pharyngeal peristalsis;Reduced epiglottic inversion;Reduced anterior laryngeal mobility;Reduced laryngeal elevation;Reduced tongue base retraction;Pharyngeal residue - valleculae;Pharyngeal residue - pyriform Pharyngeal -- Pharyngeal- Nectar Cup NT  Pharyngeal -- Pharyngeal- Nectar Straw -- Pharyngeal -- Pharyngeal- Thin Teaspoon Delayed swallow initiation-pyriform sinuses;Reduced pharyngeal peristalsis;Reduced epiglottic inversion;Reduced anterior laryngeal mobility;Reduced laryngeal elevation;Reduced tongue base retraction;Pharyngeal residue - valleculae;Pharyngeal residue - pyriform;Penetration/Aspiration during swallow;Penetration/Aspiration before swallow;Trace aspiration Pharyngeal Material enters airway, passes BELOW cords without attempt by patient to eject out (silent aspiration);Material enters airway, passes BELOW cords and not ejected out despite cough attempt by patient Pharyngeal- Thin Cup -- Pharyngeal -- Pharyngeal- Thin Straw -- Pharyngeal -- Pharyngeal- Puree Delayed swallow initiation-pyriform sinuses;Reduced pharyngeal peristalsis;Reduced epiglottic inversion;Reduced anterior laryngeal mobility;Reduced laryngeal elevation;Reduced tongue base retraction;Pharyngeal residue - valleculae;Pharyngeal residue - pyriform;Delayed swallow initiation-vallecula Pharyngeal -- Pharyngeal- Mechanical Soft -- Pharyngeal -- Pharyngeal- Regular -- Pharyngeal -- Pharyngeal- Multi-consistency -- Pharyngeal -- Pharyngeal- Pill -- Pharyngeal -- Pharyngeal Comment --  No flowsheet data found. [name] 06/09/2019, 4:39 PM                   Scheduled Meds: . amoxicillin  500 mg Per Tube Q8H  . doxazosin  1 mg Per Tube Daily  . feeding supplement (PRO-STAT SUGAR FREE 64)  30 mL Per Tube BID  . free water  125 mL Per Tube Q4H  . multivitamin  15 mL Per Tube Daily  . polyethylene glycol  17 g Per Tube Daily  . simvastatin  40 mg Per Tube q1800  . sodium chloride  1 spray Each Nare QID   Continuous Infusions: . feeding supplement (OSMOLITE 1.2 CAL) 60 mL/hr at 06/10/19 0600     LOS: 14 days    Time spent: 30 minutes    Barb Merino, MD [facility] Pager [phone]

## 2019-06-11 NOTE — Progress Notes (Signed)
  Speech Language Pathology Treatment: Dysphagia  Patient Details Name: William Mann MRN: [id number] DOB: May 05, 1929 Today's Date: 06/11/2019 Time: [id number] SLP Time Calculation (min) (ACUTE ONLY): 46 min  Assessment / Plan / Recommendation Clinical Impression  Pt was seen at bedside for skilled ST intervention targeting goals for po readiness, cognition, and communication. Pt was seated upright in recliner, wife present. Oral care was completed with suction, with effective removal of thick secretions adhered to the roof of his mouth. Following oral care, pt accepted teaspoon boluses of honey thick liquid, with adequate oral prep and clearing, and no anterior leakage. Intermittent wet voice was noted, which cleared with cued throat clear or cough. Recommend continuing with NPO status at this time, given high aspiration risk. Also recommend thorough oral care with suction at least 3x/day.  Pt is markedly dysarthric, but is able to significantly improve his intelligibility with reduced rate and overarticulation. He does require ongoing cues for carryover. Pt was able to answer yes/no questions accurately, follow simple directions with min cues. He was oriented to person, place, and year. He recalled that his son was visiting (he had stepped out to talk on the phone), and that he and his wife had been married for 68 years.   Pt is much more alert and participatory today, and would be an excellent rehab candidate if this trend continues. SLP will continue to follow for dysphagia, dysarthria, and cognitive communication deficits.    HPI HPI: William Mann is a 86 y.o. male with medical history significant of thyroid nodule, emphysema, GERD, right carotid endarterectomy,PVD, hypertension, hyperlipidemia, lower extremity DVT, bilateral hearing loss, presented with new onset of slurred speech and facial droop. Per chart wife also noticed significant right leg weakness, right-sided facial droop,  and cough/choking after eating drink since last night. MRI shows small acute infarcts in the left basal ganglia/corona radiata and right thalamus. Pt with increasing lethargy prompting repeat MRI on 4/27 - this revealed extension of the left basal ganglia/radiating white matter tract infarction which is larger than on the initial presentation, measuring up to 2 cm in size presently.       SLP Plan  Continue with current plan of care       Recommendations  Diet recommendations: NPO Medication Administration: Via alternative means                Oral Care Recommendations: Oral care QID Follow up Recommendations: Inpatient Rehab SLP Visit Diagnosis: Dysphagia, oropharyngeal phase (R13.12);Cognitive communication deficit (R41.841) Plan: Continue with current plan of care       [place]. Quentin Ore, [facility], [place] Speech Language Pathologist Office: [phone] Pager: [phone]  Shonna Chock 06/11/2019, 2:00 PM

## 2019-06-11 NOTE — Progress Notes (Signed)
Inpatient Rehabilitation Admissions Coordinator  I met with patient , son and wife at bedside. Patient alert and speech is clearer. I discussed that we will need consistent alertness demonstrated before we could possibly seek CIR admit. We can not admit patient for a trial of therapy. If he is consistent with therapy, we can consider CIR. Otherwise will need SNF for prolonged recuperation. I discussed with Dr. Sloan Leiter who is seeking PEG placement. I will follow up tomorrow.  Danne Baxter, RN, MSN Rehab Admissions Coordinator [phone] 06/11/2019 11:16 AM

## 2019-06-11 NOTE — Progress Notes (Signed)
Physical Therapy Treatment Patient Details Name: William Mann MRN: [id number] DOB: 1929-09-05 Today's Date: 06/11/2019    History of Present Illness [name] is a 86 y.o. male with history of  R ICA stenosis s/p R CEA, PVD, hypertension, hyperlipidemia, lower extremity DVT on Eliquis, carotid artery occlusion, atherosclerosis and bilateral hearing loss presenting with dysarthria, facial droop, R leg weakness.  Found to have L BG/CR and R PLIC/thalamic infarcts. Repeat CT 4/26 and MRI 4/27 revealed extension of the left basal ganglia/radiating white matter tract infarction.    PT Comments    Patient alert on arrival. Very talkative (although much of it unintelligible). Able to assist with all mobility more today (even taking ~8 pivotal steps to recliner) and participated in bil LE strengthening exercises and stretches for improved ROM. Noted to be moving RUE more than previous two days.   Discussed discharge plan with son (present during session) and Dr. Sloan Leiter (arrived end of session). Patient has waxed and waned with his abilities and level of alertness making it difficult to pin down an appropriate discharge plan. Would recommend CIR admission coordinator reassess patient (perhaps this pm to see his level of fatigue after an active morning).    Follow Up Recommendations  CIR;Supervision/Assistance - 24 hour;SNF(pt waxing and waning, today could tolerate CIR)     Equipment Recommendations  None recommended by PT(defer to post acute)    Recommendations for Other Services       Precautions / Restrictions Precautions Precautions: Fall Restrictions Weight Bearing Restrictions: No    Mobility  Bed Mobility Overal bed mobility: Needs Assistance Bed Mobility: Rolling;Sidelying to Sit Rolling: Mod assist Sidelying to sit: HOB elevated;Min assist;Mod assist       General bed mobility comments: pt moved legs over EOB without assist, initiated raising trunk by  pushing through rt elbow and LUE pushing on rail (min assist to raise trunk), assisting with scoot to EOB with mod assist  Transfers Overall transfer level: Needs assistance Equipment used: None Transfers: Sit to/from [facility] Sit to Stand: Mod assist;+2 physical assistance;From elevated surface Stand pivot transfers: Mod assist;+2 physical assistance;From elevated surface       General transfer comment: from elevated bed (~4") with use of bed pad under pelvis and pt holding PTs arm with his LUE; pivotal steps to chair on his right with pt independently advancing each leg with small steps (6-8 steps to pivot)  Ambulation/Gait             General Gait Details: pivotal steps bed to chair   Stairs             Wheelchair Mobility    Modified Rankin (Stroke Patients Only) Modified Rankin (Stroke Patients Only) Pre-Morbid Rankin Score: Moderate disability Modified Rankin: Moderately severe disability     Balance Overall balance assessment: Needs assistance Sitting-balance support: Feet supported;Bilateral upper extremity supported Sitting balance-Leahy Scale: Poor Sitting balance - Comments: pt able to maintain sitting balance with min guard for safety; able to laterally shift to his rt to advance lt hip in scooting without assist   Standing balance support: Single extremity supported Standing balance-Leahy Scale: Poor Standing balance comment: posterior imbalance; knees not fully extended                            Cognition Arousal/Alertness: Awake/alert(but improves with sitting, standing) Behavior During Therapy: Flat affect Overall Cognitive Status: Impaired/Different from baseline Area of Impairment: Following  commands;Attention;Awareness                   Current Attention Level: Sustained   Following Commands: Follows one step commands with increased time Safety/Judgement: Decreased awareness of deficits Awareness:  Intellectual   General Comments: orientation not assessed; more conversant, unintelligible at times      Exercises General Exercises - Lower Extremity Long Arc Quad: AROM;Both;5 reps;Seated(followed by 30 sec passive hamstring stretch to 0 deg extnsi) Hip ABduction/ADduction: AAROM;Both;10 reps;Other (comment)(reclined in recliner) Hip Flexion/Marching: AROM;Both;20 reps;Seated Toe Raises: AROM;Both;10 reps;Seated Heel Raises: AROM;Both;10 reps;Seated Other Exercises Other Exercises: seated figure 4 stretch bil x 30 sec (pt able to independently cross lt ankle over rt knee, min assist to cross lt ankle over rt knee    General Comments General comments (skin integrity, edema, etc.): son present      Pertinent Vitals/Pain Pain Assessment: Faces Faces Pain Scale: No hurt    Home Living                      Prior Function            PT Goals (current goals can now be found in the care plan section) Acute Rehab PT Goals Patient Stated Goal: to get stronger and go home Time For Goal Achievement: 06/12/19 Potential to Achieve Goals: Fair Progress towards PT goals: Progressing toward goals    Frequency    Min 4X/week      PT Plan Discharge plan needs to be updated    Co-evaluation              AM-PAC PT "6 Clicks" Mobility   Outcome Measure  Help needed turning from your back to your side while in a flat bed without using bedrails?: A Lot Help needed moving from lying on your back to sitting on the side of a flat bed without using bedrails?: A Lot Help needed moving to and from a bed to a chair (including a wheelchair)?: A Lot Help needed standing up from a chair using your arms (e.g., wheelchair or bedside chair)?: A Lot Help needed to walk in hospital room?: Total Help needed climbing 3-5 steps with a railing? : Total 6 Click Score: 10    End of Session Equipment Utilized During Treatment: Gait belt Activity Tolerance: Patient tolerated treatment  well Patient left: with call bell/phone within reach;with family/visitor present;in chair;with chair alarm set Nurse Communication: Mobility status PT Visit Diagnosis: Other abnormalities of gait and mobility (R26.89);Muscle weakness (generalized) (M62.81);Other symptoms and signs involving the nervous system (R29.898)     Time: [id number] PT Time Calculation (min) (ACUTE ONLY): 31 min  Charges:  $Therapeutic Activity: 8-22 mins $Neuromuscular Re-education: 8-22 mins                      Arby Barrette, PT Pager [phone]    Rexanne Mano 06/11/2019, 10:08 AM

## 2019-06-11 NOTE — TOC Initial Note (Signed)
Transition of Care [facility]) - Initial/Assessment Note    Patient Details  Name: William Mann MRN: [id number] Date of Birth: 11/02/1929  Transition of [facility]) CM/SW Contact:    Kirstie Peri, [place] Work Phone Number: 06/11/2019, 1:33 PM  Clinical Narrative:                 MSW Intern met with pt, son, and spouse at bedside to discuss discharge planning. CIR is currently unable to accept pt due to lack of stability in improvement, but may take him if there is more steady improvement. Family is still hopeful for CIR, but is agreeable to SNF if unable to accept. Spouse was at [facility] and would like pt to go there, she requested Whitney at [facility] be called and asked about openings. They also asked for [facility] as their second choice, and Lelan Pons is there contact. Beyond that they agreed for pt to be faxed out further in [place].  Pt's son asked if he could be sent to [facility], as he lives there and the pt has a Games developer. He was advised that he would need to find places and sw would fax the information. Pt fl2 and assessment completed, faxed out. SW will continue to follow.   Expected Discharge Plan: Skilled Nursing Facility Barriers to Discharge: Continued Medical Work up   Patient Goals and CMS Choice Patient states their goals for this hospitalization and ongoing recovery are:: Pt family still wants CIR, but agreeable to SNF if unable to accept. CMS Medicare.gov Compare Post Acute Care list provided to:: Patient Represenative (must comment)(spouse) Choice offered to / list presented to : Spouse  Expected Discharge Plan and Services Expected Discharge Plan: [place] arrangements for the past 2 months: Single Family Home                                      Prior Living Arrangements/Services Living arrangements for the past 2 months: Single Family Home Lives with:: Spouse Patient language and need for interpreter  reviewed:: Yes Do you feel safe going back to the place where you live?: Yes      Need for Family Participation in Patient Care: Yes (Comment) Care giver support system in place?: Yes (comment)   Criminal Activity/Legal Involvement Pertinent to Current Situation/Hospitalization: No - Comment as needed  Activities of Daily Living      Permission Sought/Granted Permission sought to share information with : Facility Art therapist granted to share information with : Yes, Verbal Permission Granted  Share Information with NAME: William Mann     Permission granted to share info w Relationship: Spouse  Permission granted to share info w Contact Information: [phone]  Emotional Assessment Appearance:: Appears stated age Attitude/Demeanor/Rapport: Engaged Affect (typically observed): Pleasant Orientation: : Oriented to Self Alcohol / Substance Use: Not Applicable Psych Involvement: No (comment)  Admission diagnosis:  SOB (shortness of breath) [R06.02] CVA (cerebral vascular accident) ([place]) [I63.9] Cerebrovascular accident (CVA) due to bilateral stenosis of carotid arteries ([place]) [I63.233] Cerebrovascular accident (CVA), unspecified mechanism ([place]) [I63.9] Patient Active Problem List   Diagnosis Date Noted  . Protein-calorie malnutrition, severe 06/06/2019  . DNR (do not resuscitate)   . Palliative care encounter   . Dysphagia due to recent cerebral infarction   . Hemiplegia as late effect of cerebral infarction ([place])   . Advanced care  planning/counseling discussion   . Goals of care, counseling/discussion   . 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 Palliative care by specialist   . Coronary artery disease involving native coronary artery of native heart without angina pectoris   . Deep venous thrombosis ([place])   . Fatty liver   . Dementia without behavioral disturbance ([place])   . Tachycardia   . Hypertension   . Leukocytosis   . Stroke (cerebrum) ([place])  05/28/2019  . CVA (cerebral vascular accident) ([place]) 05/28/2019  . Pacemaker 03/24/2019  . Complete heart block ([place]) 12/20/2018  . Aftercare following surgery of the circulatory system 02/17/2014  . Bilateral carotid artery occlusion 02/17/2014  . Aftercare following surgery of the circulatory system, [place] 01/17/2013  . Numbness and tingling 02/27/2012  . Peripheral vascular disease, unspecified ([place]) 02/27/2012  . Occlusion and stenosis of carotid artery without mention of cerebral infarction 03/14/2011   PCP:  Patient, No Pcp Per Pharmacy:   [id number] [address], [address] AT [facility] [address] [place] [address] Phone: [phone] Fax: [place] [facility] [place], [place] [address] Phone: [phone] Fax: [phone]     Social Determinants of Health (SDOH) Interventions    Readmission Risk Interventions No flowsheet data found.

## 2019-06-11 NOTE — Progress Notes (Signed)
Nutrition Follow-up  DOCUMENTATION CODES:   Severe malnutrition in context of acute illness/injury  INTERVENTION:  Continue: Osmolite 1.2 cal @ 41m/hr (14493m via Cortrak   3066mro-stat BID via Cortrak  125m3mee water Q4H via Cortrak   At goal, tube feeding will provide 1928 kcal, 110 grams protein, 1180ml70me water (1930ml 87m free water flushes)   NUTRITION DIAGNOSIS:   Severe Malnutrition related to acute illness(CVA) as evidenced by moderate fat depletion, moderate muscle depletion.  Ongoing.  GOAL:   Patient will meet greater than or equal to 90% of their needs  Met with TF.   MONITOR:   Labs, Weight trends, TF tolerance, I & O's  REASON FOR ASSESSMENT:   Consult Enteral/tube feeding initiation and management  ASSESSMENT:   Pt with a PMH significant for PVD, HTN, HLD, DVT, PPM implant, carotid stenosis s/p right carotid endarterectomy, COVID-19 02/2019. Pt admitted with bilateral CVA.  4/22 - failed BSE 4/23 - failed MBS, Cortrak placed(tip in stomach)  4/27 - MRI revealed extension of L BG stroke 4/30 - Cortrak replaced (tip in stomach) 5/03 - failed MBS  RN in room at time of visit.   Pt pending SNF vs CIR. If pt not a candidate for CIR and needing SNF, he will  need a PEG tube which could delay discharge due to national shortage of PEG tubes.   Pt more alert today than at previous encounters. Pt's son in room at time of visit. Pt reports no abdominal pain and is expressing desire to eat a bacon egg and cheese sandwich.   Current tube feeding regimen: 30ml P57mtat BID, 125ml fr100mater Q4H, Osmolite 1.2 cal @ 60ml/hr 72ms: CBGs 120-115-135 Medications reviewed and include: MVI, Miralax   UOP: 850ml x24 57ms I/O: -1,291ml since66mit  Diet Order:   Diet Order            Diet NPO time specified  Diet effective now              EDUCATION NEEDS:   No education needs have been identified at this time  Skin:  Skin Assessment:  Reviewed RN Assessment  Last BM:  5/3 type 7  Height:   Ht Readings from Last 1 Encounters:  05/27/19 5' 8"  (1.727 m)    Weight:   Wt Readings from Last 1 Encounters:  06/05/19 68.3 kg    BMI:  Body mass index is 22.89 kg/m.  Estimated Nutritional Needs:   Kcal:  1900-2100  Protein:  95-110 grams  Fluid:  >1.9L/d    Elease Swarm AverLarkin InaDN RD pager number and weekend/on-call pager number located in [email or web address]

## 2019-06-11 NOTE — NC FL2 (Signed)
[place] MEDICAID FL2 LEVEL OF CARE SCREENING TOOL     IDENTIFICATION  Patient Name: William Mann Birthdate: 24-Oct-1929 Sex: male Admission Date (Current Location): 05/28/2019  [facility] and [place] Number:  Herbalist and Address:  [facility] [place] [address], [place],  [address]      Provider Number: [id number]  Attending Physician Name and Address:  Barb Merino, MD  Relative Name and Phone Number:  Pranith, V[place]er [phone]    Current Level of Care: Hospital Recommended Level of Care: [place] Prior Approval Number:    Date Approved/Denied:   PASRR Number: [id number]  Discharge Plan: SNF    Current Diagnoses: Patient Active Problem List   Diagnosis Date Noted  . Protein-calorie malnutrition, severe 06/06/2019  . DNR (do not resuscitate)   . Palliative care encounter   . Dysphagia due to recent cerebral infarction   . Hemiplegia as late effect of cerebral infarction ([place])   . Advanced care planning/counseling discussion   . Goals of care, counseling/discussion   . 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 Palliative care by specialist   . Coronary artery disease involving native coronary artery of native heart without angina pectoris   . Deep venous thrombosis ([place])   . Fatty liver   . Dementia without behavioral disturbance ([place])   . Tachycardia   . Hypertension   . Leukocytosis   . Stroke (cerebrum) ([place]) 05/28/2019  . CVA (cerebral vascular accident) ([place]) 05/28/2019  . Pacemaker 03/24/2019  . Complete heart block ([place]) 12/20/2018  . Aftercare following surgery of the circulatory system 02/17/2014  . Bilateral carotid artery occlusion 02/17/2014  . Aftercare following surgery of the circulatory system, [place] 01/17/2013  . Numbness and tingling 02/27/2012  . Peripheral vascular disease, unspecified ([place]) 02/27/2012  . Occlusion and stenosis of carotid artery without mention of cerebral  infarction 03/14/2011    Orientation RESPIRATION BLADDER Height & Weight     Self  Normal External catheter Weight: 150 lb 9.2 oz (68.3 kg) Height:     BEHAVIORAL SYMPTOMS/MOOD NEUROLOGICAL BOWEL NUTRITION STATUS      Incontinent (See discharge summary)  AMBULATORY STATUS COMMUNICATION OF NEEDS Skin   Extensive Assist Verbally Other (Comment)(Eccymosis, Arm, hip, right and left)                       Personal Care Assistance Level of Assistance  Bathing, Feeding, Dressing Bathing Assistance: Maximum assistance Feeding assistance: (Unable to Assess) Dressing Assistance: Maximum assistance     Functional Limitations Info  Sight, Hearing, Speech Sight Info: Impaired Hearing Info: Impaired Speech Info: Impaired(slurred)    SPECIAL CARE FACTORS FREQUENCY  PT (By licensed PT), OT (By licensed OT), Speech therapy     PT Frequency: 5x week OT Frequency: 5x week     Speech Therapy Frequency: 5x week      Contractures Contractures Info: Not present    Additional Factors Info  Code Status, Allergies Code Status Info: DNR Allergies Info: Atorvastatin, Rosuvastatin, Viagra           Current Medications (06/11/2019):  This is the current hospital active medication list Current Facility-Administered Medications  Medication Dose Route Frequency Provider Last Rate Last Admin  . acetaminophen (TYLENOL) tablet 650 mg  650 mg Oral Q4H PRN Wynetta Fines T, MD       Or  . acetaminophen (TYLENOL) 160 MG/5ML solution 650 mg  650 mg Per Tube  Q4H PRN Lequita Halt, MD       Or  . acetaminophen (TYLENOL) suppository 650 mg  650 mg Rectal Q4H PRN Wynetta Fines T, MD      . amoxicillin (AMOXIL) 250 MG/5ML suspension 500 mg  500 mg Per Tube Shelly Coss, MD   500 mg at 06/11/19 1324  . doxazosin (CARDURA) tablet 1 mg  1 mg Per Tube Daily Lavina Hamman, MD   1 mg at 06/11/19 1001  . feeding supplement (OSMOLITE 1.2 CAL) liquid 1,000 mL  1,000 mL Per Tube Continuous Barb Merino,  MD 60 mL/hr at 06/10/19 0600 Rate Verify at 06/10/19 0600  . feeding supplement (PRO-STAT SUGAR FREE 64) liquid 30 mL  30 mL Per Tube BID Barb Merino, MD   30 mL at 06/11/19 1001  . free water 125 mL  125 mL Per Tube Q4H Barb Merino, MD   125 mL at 06/11/19 1300  . multivitamin liquid 15 mL  15 mL Per Tube Daily Lavina Hamman, MD   15 mL at 06/11/19 1001  . polyethylene glycol (MIRALAX / GLYCOLAX) packet 17 g  17 g Per Tube Daily Dellinger, Marianne L, PA-C   17 g at 06/11/19 1001  . simvastatin (ZOCOR) tablet 40 mg  40 mg Per Tube q1800 Lavina Hamman, MD   40 mg at 06/10/19 1722  . sodium chloride (OCEAN) 0.65 % nasal spray 1 spray  1 spray Each Nare QID Lavina Hamman, MD   1 spray at 06/11/19 1324  . zolpidem (AMBIEN) tablet 5 mg  5 mg Per Tube QHS PRN Lavina Hamman, MD   5 mg at 06/08/19 2159     Discharge Medications: Please see discharge summary for a list of discharge medications.  Relevant Imaging Results:  Relevant Lab Results:   Additional Information SS# [place], Student-Social Work

## 2019-06-11 NOTE — Progress Notes (Signed)
ANTICOAGULATION CONSULT NOTE - Follow Up Consult  Pharmacy Consult for enoxaparin  Indication: atrial fibrillation  Allergies  Allergen Reactions  . Atorvastatin Other (See Comments)    Liver inflammation    . Rosuvastatin Other (See Comments)    Muscle pain  . Viagra [Sildenafil] Other (See Comments)    Reaction ??    Patient Measurements: Weight: 68.3 kg (150 lb 9.2 oz)   Vital Signs: Temp: 97.9 F (36.6 C) (05/05 1210) Temp Source: Oral (05/05 1210) BP: 97/66 (05/05 1210) Pulse Rate: 99 (05/05 1210)  Labs: No results for input(s): HGB, HCT, PLT, APTT, LABPROT, INR, HEPARINUNFRC, HEPRLOWMOCWT, CREATININE, CKTOTAL, CKMB, TROPONINIHS in the last 72 hours.  Estimated Creatinine Clearance: 61.7 mL/min (by C-G formula based on SCr of 0.78 mg/dL).   Assessment: 89yom with Afib CVR previously on apixaban for anticoagulation.  He has had fluctuating mental status, now not eating and coretrack placed.  While unstable apixaban held, and enoxaparin 1mg /kg/q12h ordered  Wt 68kg, Cr 0.8 crcl 49ml/min, h/h and pltc stable earlier this week will recheck in am    Goal of Therapy:  Anti-Xa level 0.6-1 units/ml 4hrs after LMWH dose given Monitor platelets by anticoagulation protocol: Yes   Plan:  Enoxaparin 60mg  sq q12h Monitor renal function, cbc and s/s bleeding  Bonnita Nasuti Pharm.D. CPP, BCPS Clinical Pharmacist [phone] 06/11/2019 3:13 PM

## 2019-06-12 LAB — CBC
HCT: 43.3 % (ref 39.0–52.0)
Hemoglobin: 14.6 g/dL (ref 13.0–17.0)
MCH: 34.6 pg — ABNORMAL HIGH (ref 26.0–34.0)
MCHC: 33.7 g/dL (ref 30.0–36.0)
MCV: 102.6 fL — ABNORMAL HIGH (ref 80.0–100.0)
Platelets: 316 10*3/uL (ref 150–400)
RBC: 4.22 MIL/uL (ref 4.22–5.81)
RDW: 13.2 % (ref 11.5–15.5)
WBC: 21.2 10*3/uL — ABNORMAL HIGH (ref 4.0–10.5)
nRBC: 0 % (ref 0.0–0.2)

## 2019-06-12 LAB — BASIC METABOLIC PANEL
Anion gap: 4 — ABNORMAL LOW (ref 5–15)
BUN: 26 mg/dL — ABNORMAL HIGH (ref 8–23)
CO2: 27 mmol/L (ref 22–32)
Calcium: 7.9 mg/dL — ABNORMAL LOW (ref 8.9–10.3)
Chloride: 105 mmol/L (ref 98–111)
Creatinine, Ser: 0.85 mg/dL (ref 0.61–1.24)
GFR calc Af Amer: >60 mL/min (ref >60)
GFR calc non Af Amer: >60 mL/min (ref >60)
Glucose, Bld: 119 mg/dL — ABNORMAL HIGH (ref 70–99)
Potassium: 4.1 mmol/L (ref 3.5–5.1)
Sodium: 136 mmol/L (ref 135–145)

## 2019-06-12 LAB — GLUCOSE, CAPILLARY
Glucose-Capillary: 109 mg/dL — ABNORMAL HIGH (ref 70–99)
Glucose-Capillary: 116 mg/dL — ABNORMAL HIGH (ref 70–99)
Glucose-Capillary: 116 mg/dL — ABNORMAL HIGH (ref 70–99)
Glucose-Capillary: 118 mg/dL — ABNORMAL HIGH (ref 70–99)
Glucose-Capillary: 128 mg/dL — ABNORMAL HIGH (ref 70–99)
Glucose-Capillary: 128 mg/dL — ABNORMAL HIGH (ref 70–99)
Glucose-Capillary: 133 mg/dL — ABNORMAL HIGH (ref 70–99)

## 2019-06-12 MED ORDER — ORAL CARE MOUTH RINSE
15.0000 mL | Freq: Two times a day (BID) | OROMUCOSAL | Status: DC
  Administered 2019-06-12 – 2019-06-25 (×27): 15 mL via OROMUCOSAL

## 2019-06-12 MED ORDER — CHLORHEXIDINE GLUCONATE 0.12 % MT SOLN
15.0000 mL | Freq: Two times a day (BID) | OROMUCOSAL | Status: DC
  Administered 2019-06-12 – 2019-06-25 (×27): 15 mL via OROMUCOSAL
  Filled 2019-06-12 (×20): qty 15

## 2019-06-12 NOTE — Progress Notes (Signed)
Patient ID: William Mann, male   DOB: 09/22/1929, 86 y.o.   MRN: [id number] Request received for possible gastrostomy tube placement in pt. CT abd performed yesterday revealed:   1. Gastric anatomy NOT amenable to attempted percutaneous gastrostomy tube placement due to interposition of the transverse colon. If gastrostomy tube placement is still desired, surgical consultation is advised. 2. Subcutaneous edema about the midline of the low back with suspected approximately 6.67 cm subcutaneous hematoma about the left-sided low back without associated fracture or radiopaque foreign body. Clinical correlation is advised. 3. Nonobstructing right-sided nephrolithiasis. 4. Coronary calcifications.  Aortic Atherosclerosis (ICD10-I70.0).  As above, recommend surgical consultation if G tube still desired

## 2019-06-12 NOTE — Progress Notes (Signed)
Occupational Therapy Treatment Patient Details Name: William Mann MRN: [id number] DOB: [birth year] Today's Date: 06/12/2019    History of present illness William Mann is a 86 y.o. male with history of  R ICA stenosis s/p R CEA, PVD, hypertension, hyperlipidemia, lower extremity DVT on Eliquis, carotid artery occlusion, atherosclerosis and bilateral hearing loss presenting with dysarthria, facial droop, R leg weakness.  Found to have L BG/CR and R PLIC/thalamic infarcts. Repeat CT 4/26 and MRI 4/27 revealed extension of the left basal ganglia/radiating white matter tract infarction.   OT comments  Patient continues to make steady progress towards goals in skilled OT session. Patient's session encompassed ADLs and increased ability to engage trunk in order to complete higher level ADL tasks and increase activity tolerance. Pt much more fatigued in session to date, often requiring increased cues to keep eyes open. Pt placed in more upright position to complete ADLs, which engaged a more alert state, however continued to require multi-modal cues to complete. Pt engaged minimally in trunk rotation activities (noted increased deficits of tracking fingers in session) however fatigued quickly and began to fall back asleep. Pt would continue to benefit from skilled services in order to address functional deficits; will continue to follow acutely.    Follow Up Recommendations  SNF;Supervision/Assistance - 24 hour;CIR    Equipment Recommendations  3 in 1 bedside commode    Recommendations for Other Services      Precautions / Restrictions Precautions Precautions: Fall Restrictions Weight Bearing Restrictions: No       Mobility Bed Mobility                  Transfers                      Balance                                           ADL either performed or assessed with clinical judgement   ADL Overall ADL's : Needs  assistance/impaired     Grooming: Wash/dry face;Sitting;Minimal assistance;Brushing hair;Moderate assistance Grooming Details (indicate cue type and reason): multi-modal cues for thoroughness, unable to reach back of head to brush hair                             Functional mobility during ADLs: Moderate assistance;Maximal assistance;+2 for physical assistance;+2 for safety/equipment General ADL Comments: pt significantly more lethargic in session, noted to be given Ambien at 2am attempted to address ADLs in sitting in recliner and activation of trunk rotation however pt was unable to lift trunk off of recliner without assistance     Vision       Perception     Praxis      Cognition Arousal/Alertness: Lethargic;Suspect due to medications Behavior During Therapy: Flat affect Overall Cognitive Status: Impaired/Different from baseline Area of Impairment: Following commands;Attention;Awareness;Orientation                 Orientation Level: Disoriented to;Place;Time;Situation Current Attention Level: Sustained   Following Commands: Follows one step commands with increased time Safety/Judgement: Decreased awareness of deficits Awareness: Intellectual   General Comments: Significantly more lethargic in session, able to answer to name and state wife's name, but unable to verbalize more        Exercises     Shoulder  Instructions       General Comments      Pertinent Vitals/ Pain       Pain Assessment: No/denies pain Faces Pain Scale: No hurt  Home Living                                          Prior Functioning/Environment              Frequency  Min 2X/week        Progress Toward Goals  OT Goals(current goals can now be found in the care plan section)  Progress towards OT goals: Progressing toward goals  Acute Rehab OT Goals Patient Stated Goal: to get stronger and go home OT Goal Formulation: With  patient/family Time For Goal Achievement: 06/13/19 Potential to Achieve Goals: Good  Plan Discharge plan remains appropriate    Co-evaluation                 AM-PAC OT "6 Clicks" Daily Activity     Outcome Measure   Help from another person eating meals?: A Lot Help from another person taking care of personal grooming?: A Lot Help from another person toileting, which includes using toliet, bedpan, or urinal?: Total Help from another person bathing (including washing, rinsing, drying)?: A Lot Help from another person to put on and taking off regular upper body clothing?: A Lot Help from another person to put on and taking off regular lower body clothing?: Total 6 Click Score: 10    End of Session    OT Visit Diagnosis: Unsteadiness on feet (R26.81);Muscle weakness (generalized) (M62.81);Other symptoms and signs involving cognitive function   Activity Tolerance Patient limited by lethargy;Patient limited by fatigue;Other (comment)   Patient Left in chair;with call bell/phone within reach;with family/visitor present   Nurse Communication Mobility status;Other (comment)(Requesting to take Ambien off of medication list to promote progress in therapy)        Time: 1204-1224 OT Time Calculation (min): 20 min  Charges: OT General Charges $OT Visit: 1 Visit OT Treatments $Self Care/Home Management : 8-22 mins  Pollyann Glen E. Safir Michalec, COTA/L Acute Rehabilitation Services [phone]   Cherlyn Cushing 06/12/2019, 12:56 PM

## 2019-06-12 NOTE — Plan of Care (Signed)
  Problem: Activity: Goal: Risk for activity intolerance will decrease Outcome: Progressing   Problem: Nutrition: Goal: Adequate nutrition will be maintained Outcome: Progressing   

## 2019-06-12 NOTE — Progress Notes (Signed)
PROGRESS NOTE    William Mann  [id number] DOB: 08-19-1929 DOA: 05/28/2019 PCP: Patient, No Pcp Per    Brief Narrative:  86 year old gentleman with history of peripheral vascular disease, hypertension, hyperlipidemia, DVT on Eliquis, permanent pacemaker, carotid stenosis status post right carotid endarterectomy, COVID-19 infection in January 2021 presented to the emergency room with slurred speech and facial droop as well as recurrent fall.  Initially presented to the emergency room on 4/20 with fall, negative neuro exam and was sent home.  Subsequently developed slurred speech, unsteady gait, right leg weakness, right facial droop and coughing and choking after eating so brought back to the ER.  An MRI showed small acute infarct in the left basal ganglia/corona radiata and right thalamus.    Remained in the hospital with fluctuating mental status. Pending decision to go to CIR vs SNF Patient had fluctuating mental status, unable to eat and remains on core track feeding. 5/5: Patient more alert.  Been on core track feeding for 14 days, requested IR for PEG tube feeding.  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  Altered mental status. CT head findings, initially negative.  Follow-up CT scan with new periventricular infarct MRI of the brain, left basal ganglia and right thalamic infarcts.  Brain atrophy. Repeat MRI 4/27, slight increase in size of left basal ganglia stroke, evaluation of right thalamic infarcts. CT of the head and neck, no large vessel occlusion.  Left ICA plaque with 50% stenosis.  Right CEA patent.  Bilateral PCA moderate to severe stenosis. 2D echocardiogram, EF 55%.  Grade 1 diastolic dysfunction.  No emboli. Antiplatelet therapy, on aspirin 81 mg and Eliquis 5 mg twice a day before event, holding for possible procedure.  On therapeutic Lovenox now. LDL, 160 statin, Zocor increased from 10-40 mg  Hemoglobin A1c, 5.  No indication for treatment. Therapy recommendations, acute inpatient rehab vs SNF pending clinical improvement.   Dysphagia: Due to acute stroke.  Repeat swallow evaluation with not much improvement. Does have core track feeding tube, 2 weeks now. IR consulted for image guided G-tube placement. Eliquis and aspirin on hold, last dose 5/4 AM. Since he is waiting for PEG tube placement, started on therapeutic Lovenox.  Intracranial stenosis: Medical management.  Avoid low blood pressure.  Acute UTI: Unknown present on admission.  Patient growing more than 100,000 colonies of Aerococcus.  Treated with amoxicillin. Today with leukocytosis.  No evidence of active infection.  Will recheck urine cultures.  Ethics Barrie Folk of care:  Palliative care consulted to discuss, coordinate and counseled patient and family. Inserted NG tube and started nutrition. No CPR, limited resuscitation, okay for artificial feeding tube.  DVT prophylaxis: Lovenox. Code Status:  DNR with limited intervention. Family Communication: Son at the bedside. Disposition Plan: Status is: Inpatient  Remains inpatient  appropriate because:Inpatient level of care appropriate due to severity of illness.  Pending clinical improvement.  Pending PEG tube placement.   Dispo: The patient is from: Home              Anticipated d/c is to: CIR versus SNF.              Anticipated d/c date is: Unknown.            Patient is not stable to discharge because of need for PEG tube placement. If patient remains more interactive and able to keep up with rehab, he may still be a good candidate to go to the acute inpatient rehab.   Consultants:   Neurology  Palliative medicine  Procedures:   None  Antimicrobials:   None   Subjective: Seen and examined.  No overnight events.  He was sleepy in the morning.  He was able to wake up and try to communicate.   Objective: Vitals:   06/11/19 2046 06/12/19 0024 06/12/19 0419 06/12/19 0800  BP: 104/74 104/62 (!) 104/58 (!) 97/57  Pulse: 93 95 60 100  Resp: 18 19 17 18   Temp: (!) 97.5 F (36.4 C) 98.1 F (36.7 C) 98.9 F (37.2 C) 98.7 F (37.1 C)  TempSrc: Oral Oral  Oral  SpO2: 98% 99% 97% 98%  Weight:        Intake/Output Summary (Last 24 hours) at 06/12/2019 1032 Last data filed at 06/12/2019 0626 Gross per 24 hour  Intake 2446 ml  Output 1600 ml  Net 846 ml   Filed Weights   06/05/19 0349  Weight: 68.3 kg    Examination:  General exam: Appears calm, not in any distress.  Comfortable. Respiratory system: Clear to auscultation. Respiratory effort normal.  No added sound.  Pacemaker in place. Cardiovascular system: S1 & S2 heard, RRR.   Gastrointestinal system: Abdomen is nondistended, soft and nontender.  Bowel sounds present.  NG tube in place. Central nervous system:  Left upper and lower extremities normal. right facial droop. Dysphonia, however he is able to understand most of the things and following commands. Right upper extremity 3-4/5 Right lower extremity 4/5 Psychiatry: Judgement and insight appear normal.  Difficulty  communicate.    Data Reviewed: I have personally reviewed following labs and imaging studies  CBC: Recent Labs  Lab 06/08/19 0805 06/12/19 0445  WBC 11.4* 21.2*  NEUTROABS 8.8*  --   HGB 13.8 14.6  HCT 40.9 43.3  MCV 101.5* 102.6*  PLT 242 [id number]   Basic Metabolic Panel: Recent Labs  Lab 06/08/19 0805 06/12/19 0445  NA 139 136  K 4.0 4.1  CL 106 105  CO2 24 27  GLUCOSE 110* 119*  BUN 30* 26*  CREATININE 0.78 0.85  CALCIUM 8.2* 7.9*  MG 2.1  --   PHOS 3.5  --    GFR: Estimated Creatinine Clearance: 58 mL/min (by C-G formula based on SCr of 0.85 mg/dL). Liver Function Tests: Recent Labs  Lab 06/08/19 0805  AST 125*  ALT 174*  ALKPHOS 137*  BILITOT 1.2  PROT 5.2*  ALBUMIN 2.3*   No results for input(s): LIPASE, AMYLASE in the last 168 hours. No results for input(s): AMMONIA in the last 168 hours. Coagulation Profile: No results for input(s): INR, PROTIME in the last 168 hours. Cardiac Enzymes: No results for input(s): CKTOTAL, CKMB, CKMBINDEX, TROPONINI in the last  168 hours. BNP (last 3 results) No results for input(s): PROBNP in the last 8760 hours. HbA1C: No results for input(s): HGBA1C in the last 72 hours. CBG: Recent Labs  Lab 06/11/19 1656 06/11/19 2043 06/12/19 0025 06/12/19 0417 06/12/19 0729  GLUCAP 121* 120* 128* 116* 109*   Lipid Profile: No results for input(s): CHOL, HDL, LDLCALC, TRIG, CHOLHDL, LDLDIRECT in the last 72 hours. Thyroid Function Tests: No results for input(s): TSH, T4TOTAL, FREET4, T3FREE, THYROIDAB in the last 72 hours. Anemia Panel: No results for input(s): VITAMINB12, FOLATE, FERRITIN, TIBC, IRON, RETICCTPCT in the last 72 hours. Sepsis Labs: No results for input(s): PROCALCITON, LATICACIDVEN in the last 168 hours.  Recent Results (from the past 240 hour(s))  Culture, blood (x 2)     Status: None   Collection Time: 06/02/19  3:30 PM   Specimen: BLOOD LEFT HAND  Result Value Ref Range Status   Specimen  Description BLOOD LEFT HAND  Final   Special Requests   Final    BOTTLES DRAWN AEROBIC AND ANAEROBIC Blood Culture adequate volume   Culture   Final    NO GROWTH 5 DAYS Performed at [facility], [address], [place], [place] [address]    Report Status 06/07/2019 FINAL  Final  Culture, blood (x 2)     Status: None   Collection Time: 06/02/19  3:30 PM   Specimen: BLOOD RIGHT HAND  Result Value Ref Range Status   Specimen Description BLOOD RIGHT HAND  Final   Special Requests   Final    BOTTLES DRAWN AEROBIC AND ANAEROBIC Blood Culture results may not be optimal due to an inadequate volume of blood received in culture bottles   Culture   Final    NO GROWTH 5 DAYS Performed at [facility], [place] [address] [place], [place] [address]    Report Status 06/07/2019 FINAL  Final  Urine culture     Status: Abnormal   Collection Time: 06/03/19  1:39 PM   Specimen: Urine, Catheterized  Result Value Ref Range Status   Specimen Description URINE, CATHETERIZED  Final   Special Requests NONE  Final   Culture (A)  Final    >=100,000 COLONIES/mL AEROCOCCUS VIRIDANS Standardized susceptibility testing for this organism is not available. Performed at [facility], [place] [address] [place],  [address]    Report Status 06/05/2019 FINAL  Final         Radiology Studies: CT ABDOMEN WO CONTRAST  Result Date: 06/11/2019 CLINICAL DATA:  Dysphagia. Evaluate anatomy prior to potential percutaneous gastrostomy tube placement. EXAM: CT ABDOMEN WITHOUT CONTRAST TECHNIQUE: Multidetector CT imaging of the abdomen was performed following the standard protocol without IV contrast. COMPARISON:  CT abdomen pelvis-06/03/2013; chest CT-03/11/2014 FINDINGS: The lack of intravenous contrast limits the ability to evaluate solid abdominal organs. Examination is further degraded secondary to patient's overlying left upper extremity and patient respiratory artifact. Lower chest: Evaluation of  lung bases is degraded secondary to respiratory artifact. Minimal dependent subpleural ground-glass atelectasis. No discrete focal airspace opacities. No pleural effusion. Cardiomegaly. Small pericardial effusion. Pacer leads terminate within the right atrium and right ventricular apex. Coronary artery calcifications. Hepatobiliary: Normal hepatic contour. Note is made of a approximately 0.9 cm hypoattenuating lesion within the caudal aspect the right lobe of the liver (image 29, series 3), which is incompletely characterized on the present examination though appears morphologically similar to the 05/2013 examination and thus favored to represent a hepatic cyst. Normal noncontrast appearance  of the gallbladder given degree of distention. No radiopaque gallstones. No ascites. Pancreas: Normal noncontrast appearance of the pancreas. Spleen: Normal noncontrast appearance of the spleen. Adrenals/Urinary Tract: Punctate (approximately 0.8 cm) partially exophytic hypoattenuating lesion arising from the superomedial aspect the left kidney (image 33, series 3) is too small to accurately characterize though morphologically similar to the 2015 examination and favored to represent a renal cyst. Note is made of 2 punctate (3-4 mm) right-sided renal stones (images 29 and 34, series 3). No discrete left-sided renal lesions. There is a very minimal amount of grossly symmetric bilateral perinephric stranding, likely age and body habitus related. No urinary obstruction. Normal noncontrast appearance of the bilateral adrenal glands. The urinary bladder was not imaged. Stomach/Bowel: The transverse colon is interposed between the anterior wall of the stomach and ventral abdominal wall. Enteric tube tip terminates within the gastric antrum/duodenal bulb. Enteric contrast is seen within the colon. Moderate colonic stool burden without evidence of enteric obstruction. Normal noncontrast appearance of the terminal ileum. The appendix is  not visualized compatible with provided operative history. No pneumoperitoneum, pneumatosis or portal venous gas. Vascular/Lymphatic: Scattered atherosclerotic plaque within normal caliber abdominal aorta. No bulky retroperitoneal or mesenteric lymphadenopathy on this noncontrast examination. Other: Subcutaneous edema about the midline of the low back with suspected approximately 6.7 x 1.8 cm subcutaneous hematoma about the left side of low back (image 53, series 3), without associated fracture or radiopaque foreign body. Musculoskeletal: Stigmata of dish within the thoracic spine. Mild-to-moderate multilevel lumbar spine DDD, worse at L5-S1 with disc space height loss, endplate irregularity and sclerosis. IMPRESSION: 1. Gastric anatomy NOT amenable to attempted percutaneous gastrostomy tube placement due to interposition of the transverse colon. If gastrostomy tube placement is still desired, surgical consultation is advised. 2. Subcutaneous edema about the midline of the low back with suspected approximately 6.67 cm subcutaneous hematoma about the left-sided low back without associated fracture or radiopaque foreign body. Clinical correlation is advised. 3. Nonobstructing right-sided nephrolithiasis. 4. Coronary calcifications.  Aortic Atherosclerosis (ICD10-I70.0). Electronically Signed   By: Sandi Mariscal M.D.   On: 06/11/2019 17:01        Scheduled Meds: . chlorhexidine  15 mL Mouth Rinse BID  . doxazosin  1 mg Per Tube Daily  . enoxaparin (LOVENOX) injection  60 mg Subcutaneous Q12H  . feeding supplement (PRO-STAT SUGAR FREE 64)  30 mL Per Tube BID  . free water  125 mL Per Tube Q4H  . mouth rinse  15 mL Mouth Rinse q12n4p  . multivitamin  15 mL Per Tube Daily  . polyethylene glycol  17 g Per Tube Daily  . simvastatin  40 mg Per Tube q1800  . sodium chloride  1 spray Each Nare QID   Continuous Infusions: . feeding supplement (OSMOLITE 1.2 CAL) 1,000 mL (06/11/19 1736)     LOS: 15 days     Time spent: 25 minutes    Barb Merino, MD [facility] Pager [phone]

## 2019-06-12 NOTE — Progress Notes (Addendum)
Physical Therapy Treatment Patient Details Name: William Mann MRN: [id number] DOB: 10-27-1929 Today's Date: 06/12/2019    History of Present Illness Mr. Rally Segur is a 86 y.o. male with history of  R ICA stenosis s/p R CEA, PVD, hypertension, hyperlipidemia, lower extremity DVT on Eliquis, carotid artery occlusion, atherosclerosis and bilateral hearing loss presenting with dysarthria, facial droop, R leg weakness.  Found to have L BG/CR and R PLIC/thalamic infarcts. Repeat CT 4/26 and MRI 4/27 revealed extension of the left basal ganglia/radiating white matter tract infarction.    PT Comments    Pt received in bed. Pt's son and wife present in room. Pt more lethargic today but agreeable to OOB to recliner. He required mod assist bed mobility, +2 mod assist sit to stand, and +2 mod assist stand-pivot transfer. Pt engaged in session. Following simple commands 100% of time and demonstrating good initiation of movement. Presented with intelligible speech 50% of time, otherwise, garbled speech due to fatigue. Pt in recliner with feet elevated at end of session. Goals reviewed and updated as needed.    Follow Up Recommendations  CIR;Supervision/Assistance - 24 hour;SNF(continuing to assess activity tolerance/consistency)     Equipment Recommendations  None recommended by PT(defer to next venue)    Recommendations for Other Services       Precautions / Restrictions Precautions Precautions: Fall Restrictions Weight Bearing Restrictions: No    Mobility  Bed Mobility Overal bed mobility: Needs Assistance Bed Mobility: Rolling;Sidelying to Sit Rolling: Mod assist Sidelying to sit: HOB elevated;Mod assist       General bed mobility comments: cues for sequencing, assist with BLE off bed and to elevate trunk  Transfers Overall transfer level: Needs assistance Equipment used: None Transfers: Sit to/from [employer] Transfers Sit to Stand: +2 physical assistance;Mod  assist Stand pivot transfers: +2 physical assistance;Mod assist       General transfer comment: assist to power up and stabilized balance. Pivot toward right. Unable to advance LEs for pivot steps this session. Right knee blocked for pivot.  Ambulation/Gait                 Stairs             Wheelchair Mobility    Modified Rankin (Stroke Patients Only) Modified Rankin (Stroke Patients Only) Pre-Morbid Rankin Score: Moderate disability Modified Rankin: Moderately severe disability     Balance Overall balance assessment: Needs assistance Sitting-balance support: Feet supported;Bilateral upper extremity supported Sitting balance-Leahy Scale: Poor Sitting balance - Comments: external assist to maintain sitting balance EOB     Standing balance-Leahy Scale: Poor Standing balance comment: external assist to maintain standing                            Cognition Arousal/Alertness: Awake/alert Behavior During Therapy: Flat affect Overall Cognitive Status: Impaired/Different from baseline Area of Impairment: Orientation;Attention;Awareness;Following commands;Problem solving;Safety/judgement                 Orientation Level: Disoriented to;Time;Situation Current Attention Level: Sustained   Following Commands: Follows one step commands with increased time Safety/Judgement: Decreased awareness of deficits;Decreased awareness of safety Awareness: Intellectual Problem Solving: Slow processing;Decreased initiation;Difficulty sequencing;Requires verbal cues;Requires tactile cues General Comments: Significantly more lethargic in session, able to answer to name and state wife's name, but unable to verbalize more      Exercises      General Comments        Pertinent Vitals/Pain Pain  Assessment: Faces Faces Pain Scale: No hurt    Home Living                      Prior Function            PT Goals (current goals can now be found in  the care plan section) Acute Rehab PT Goals Patient Stated Goal: home per family PT Goal Formulation: With patient/family Time For Goal Achievement: 06/26/19 Potential to Achieve Goals: Fair Progress towards PT goals: Progressing toward goals    Frequency    Min 4X/week      PT Plan Current plan remains appropriate    Co-evaluation              AM-PAC PT "6 Clicks" Mobility   Outcome Measure  Help needed turning from your back to your side while in a flat bed without using bedrails?: A Lot Help needed moving from lying on your back to sitting on the side of a flat bed without using bedrails?: A Lot Help needed moving to and from a bed to a chair (including a wheelchair)?: A Lot Help needed standing up from a chair using your arms (e.g., wheelchair or bedside chair)?: A Lot Help needed to walk in hospital room?: Total Help needed climbing 3-5 steps with a railing? : Total 6 Click Score: 10    End of Session Equipment Utilized During Treatment: Gait belt Activity Tolerance: Patient limited by fatigue Patient left: in chair;with call bell/phone within reach;with chair alarm set;with family/visitor present Nurse Communication: Mobility status PT Visit Diagnosis: Other abnormalities of gait and mobility (R26.89);Muscle weakness (generalized) (M62.81);Other symptoms and signs involving the nervous system (R29.898)     Time: [id number] PT Time Calculation (min) (ACUTE ONLY): 17 min  Charges:  $Therapeutic Activity: 8-22 mins                     Lorrin Goodell, PT  Office # [phone] Pager [phone]    Lorriane Shire 06/12/2019, 1:31 PM

## 2019-06-13 ENCOUNTER — Inpatient Hospital Stay (HOSPITAL_COMMUNITY): Payer: Medicare Other

## 2019-06-13 LAB — CBC WITH DIFFERENTIAL/PLATELET
Abs Immature Granulocytes: 0.15 10*3/uL — ABNORMAL HIGH (ref 0.00–0.07)
Basophils Absolute: 0.1 10*3/uL (ref 0.0–0.1)
Basophils Relative: 0 %
Eosinophils Absolute: 0.1 10*3/uL (ref 0.0–0.5)
Eosinophils Relative: 0 %
HCT: 42.9 % (ref 39.0–52.0)
Hemoglobin: 14.2 g/dL (ref 13.0–17.0)
Immature Granulocytes: 1 %
Lymphocytes Relative: 6 %
Lymphs Abs: 1.3 10*3/uL (ref 0.7–4.0)
MCH: 34.4 pg — ABNORMAL HIGH (ref 26.0–34.0)
MCHC: 33.1 g/dL (ref 30.0–36.0)
MCV: 103.9 fL — ABNORMAL HIGH (ref 80.0–100.0)
Monocytes Absolute: 1.3 10*3/uL — ABNORMAL HIGH (ref 0.1–1.0)
Monocytes Relative: 6 %
Neutro Abs: 20.8 10*3/uL — ABNORMAL HIGH (ref 1.7–7.7)
Neutrophils Relative %: 87 %
Platelets: 265 10*3/uL (ref 150–400)
RBC: 4.13 MIL/uL — ABNORMAL LOW (ref 4.22–5.81)
RDW: 13.4 % (ref 11.5–15.5)
WBC: 23.7 10*3/uL — ABNORMAL HIGH (ref 4.0–10.5)
nRBC: 0 % (ref 0.0–0.2)

## 2019-06-13 LAB — URINALYSIS, ROUTINE W REFLEX MICROSCOPIC
Bilirubin Urine: NEGATIVE
Glucose, UA: NEGATIVE mg/dL
Hgb urine dipstick: NEGATIVE
Ketones, ur: NEGATIVE mg/dL
Leukocytes,Ua: NEGATIVE
Nitrite: NEGATIVE
Protein, ur: NEGATIVE mg/dL
Specific Gravity, Urine: 1.021 (ref 1.005–1.030)
pH: 5 (ref 5.0–8.0)

## 2019-06-13 LAB — URINE CULTURE

## 2019-06-13 LAB — PHOSPHORUS: Phosphorus: 3.3 mg/dL (ref 2.5–4.6)

## 2019-06-13 LAB — GLUCOSE, CAPILLARY
Glucose-Capillary: 108 mg/dL — ABNORMAL HIGH (ref 70–99)
Glucose-Capillary: 124 mg/dL — ABNORMAL HIGH (ref 70–99)
Glucose-Capillary: 126 mg/dL — ABNORMAL HIGH (ref 70–99)
Glucose-Capillary: 126 mg/dL — ABNORMAL HIGH (ref 70–99)
Glucose-Capillary: 130 mg/dL — ABNORMAL HIGH (ref 70–99)
Glucose-Capillary: 141 mg/dL — ABNORMAL HIGH (ref 70–99)

## 2019-06-13 LAB — COMPREHENSIVE METABOLIC PANEL
ALT: 128 U/L — ABNORMAL HIGH (ref 0–44)
AST: 75 U/L — ABNORMAL HIGH (ref 15–41)
Albumin: 2.2 g/dL — ABNORMAL LOW (ref 3.5–5.0)
Alkaline Phosphatase: 134 U/L — ABNORMAL HIGH (ref 38–126)
Anion gap: 10 (ref 5–15)
BUN: 27 mg/dL — ABNORMAL HIGH (ref 8–23)
CO2: 26 mmol/L (ref 22–32)
Calcium: 8.2 mg/dL — ABNORMAL LOW (ref 8.9–10.3)
Chloride: 101 mmol/L (ref 98–111)
Creatinine, Ser: 0.92 mg/dL (ref 0.61–1.24)
GFR calc Af Amer: >60 mL/min (ref >60)
GFR calc non Af Amer: >60 mL/min (ref >60)
Glucose, Bld: 108 mg/dL — ABNORMAL HIGH (ref 70–99)
Potassium: 3.9 mmol/L (ref 3.5–5.1)
Sodium: 137 mmol/L (ref 135–145)
Total Bilirubin: 1.1 mg/dL (ref 0.3–1.2)
Total Protein: 5.2 g/dL — ABNORMAL LOW (ref 6.5–8.1)

## 2019-06-13 LAB — LACTIC ACID, PLASMA
Lactic Acid, Venous: 1.4 mmol/L (ref 0.5–1.9)
Lactic Acid, Venous: 1.1 mmol/L (ref 0.5–1.9)

## 2019-06-13 LAB — PROCALCITONIN: Procalcitonin: 0.27 ng/mL

## 2019-06-13 LAB — MAGNESIUM: Magnesium: 2 mg/dL (ref 1.7–2.4)

## 2019-06-13 MED ORDER — SODIUM CHLORIDE 0.9 % IV SOLN
2.0000 g | Freq: Once | INTRAVENOUS | Status: AC
  Administered 2019-06-13: 2 g via INTRAVENOUS
  Filled 2019-06-13: qty 2

## 2019-06-13 MED ORDER — SODIUM CHLORIDE 0.9 % IV SOLN
2.0000 g | Freq: Two times a day (BID) | INTRAVENOUS | Status: DC
  Administered 2019-06-14 – 2019-06-17 (×7): 2 g via INTRAVENOUS
  Filled 2019-06-13 (×7): qty 2

## 2019-06-13 MED ORDER — VANCOMYCIN HCL IN DEXTROSE 1-5 GM/200ML-% IV SOLN
1000.0000 mg | INTRAVENOUS | Status: DC
  Administered 2019-06-14 – 2019-06-16 (×3): 1000 mg via INTRAVENOUS
  Filled 2019-06-13 (×3): qty 200

## 2019-06-13 MED ORDER — METRONIDAZOLE IN NACL 5-0.79 MG/ML-% IV SOLN
500.0000 mg | Freq: Three times a day (TID) | INTRAVENOUS | Status: DC
  Administered 2019-06-13 – 2019-06-17 (×12): 500 mg via INTRAVENOUS
  Filled 2019-06-13 (×12): qty 100

## 2019-06-13 MED ORDER — MELATONIN 3 MG PO TABS
3.0000 mg | ORAL_TABLET | Freq: Every day | ORAL | Status: DC
  Administered 2019-06-13: 3 mg via ORAL
  Filled 2019-06-13: qty 1

## 2019-06-13 MED ORDER — MELATONIN 1 MG PO TABS
2.0000 mg | ORAL_TABLET | Freq: Every day | ORAL | Status: DC
  Filled 2019-06-13: qty 2

## 2019-06-13 MED ORDER — SODIUM CHLORIDE 0.9 % IV SOLN
1.0000 g | INTRAVENOUS | Status: DC
  Administered 2019-06-13: 1 g via INTRAVENOUS
  Filled 2019-06-13: qty 10

## 2019-06-13 MED ORDER — VANCOMYCIN HCL IN DEXTROSE 1-5 GM/200ML-% IV SOLN
1000.0000 mg | Freq: Once | INTRAVENOUS | Status: AC
  Administered 2019-06-13: 1000 mg via INTRAVENOUS
  Filled 2019-06-13: qty 200

## 2019-06-13 MED ORDER — SODIUM CHLORIDE 0.9 % IV BOLUS
1000.0000 mL | Freq: Once | INTRAVENOUS | Status: AC
  Administered 2019-06-13: 1000 mL via INTRAVENOUS

## 2019-06-13 NOTE — Progress Notes (Signed)
Red MEWS protocol initiated at 1300.   06/13/19 1300  Assess: MEWS Score  Temp 99.5 F (37.5 C)  BP 95/62  Pulse Rate (!) 115  Resp (!) 24  SpO2 98 %  O2 Device Room Air  Assess: MEWS Score  MEWS Temp 0  MEWS Systolic 1  MEWS Pulse 2  MEWS RR 1  MEWS LOC 0  MEWS Score 4  MEWS Score Color Red  Assess: if the MEWS score is Yellow or Red  Were vital signs taken at a resting state? Yes  Early Detection of Sepsis Score *See Row Information* High  MEWS guidelines implemented *See Row Information* Yes  Treat  MEWS Interventions Escalated (See documentation below)  Take Vital Signs  Increase Vital Sign Frequency  Red: Q 1hr X 4 then Q 4hr X 4, if remains red, continue Q 4hrs  Escalate  MEWS: Escalate Red: discuss with charge nurse/RN and provider, consider discussing with RRT  Notify: Charge Nurse/RN  Name of Charge Nurse/RN Notified Levada Dy RN  Date Charge Nurse/RN Notified 06/13/19  Time Charge Nurse/RN Notified 1310  Notify: Provider  Provider Name/Title Dr Sloan Leiter  Date Provider Notified 06/13/19  Time Provider Notified 1308  Notification Type Face-to-face  Notification Reason Change in status  Response See new orders  Date of Provider Response 06/13/19  Time of Provider Response 1308  Notify: Rapid Response  Name of Rapid Response RN Notified Helle  Date Rapid Response Notified 06/13/19  Time Rapid Response Notified 1315

## 2019-06-13 NOTE — Progress Notes (Signed)
William Mann  [id number] DOB: 07-Apr-1929 DOA: 05/28/2019 PCP: Patient, No Pcp Per    Brief Narrative:  86 year old gentleman with history of peripheral vascular disease, hypertension, hyperlipidemia, DVT on Eliquis, permanent pacemaker, carotid stenosis status post right carotid endarterectomy, COVID-19 infection in January 2021 presented to the emergency room with slurred speech and facial droop as well as recurrent fall.  Initially presented to the emergency room on 4/20 with fall, negative neuro exam and was sent home.  Subsequently developed slurred speech, unsteady gait, right leg weakness, right facial droop and coughing and choking after eating so brought back to the ER.  An MRI showed small acute infarct in the left basal ganglia/corona radiata and right thalamus.    Remained in the hospital with fluctuating mental status. Pending decision to go to CIR vs SNF Patient had fluctuating mental status, unable to eat and remains on core track feeding. 5/5: Patient more alert.  Been on core track feeding for 14 days, requested IR for PEG tube feeding. 5/7: IR unable to do image guided G-tube placement because of transverse colon in front of stomach.  Consulted surgery.  Also has leukocytosis.  Assessment & Plan:   Active Problems:   Stroke (cerebrum) (HCC)   CVA (cerebral vascular accident) ([place])   Coronary artery disease involving native coronary artery of native heart without angina pectoris   Deep venous thrombosis (HCC)   Fatty liver   Dementia without behavioral disturbance (HCC)   Tachycardia   Hypertension   Leukocytosis   Dysphagia due to recent cerebral infarction   Hemiplegia as late effect of cerebral infarction [facility])   Advanced care planning/counseling discussion   Goals of care, counseling/discussion   Apraxia due to acute stroke [facility])   Palliative care by specialist   Protein-calorie malnutrition, severe   DNR (do not resuscitate)  Palliative care encounter  Acute bilateral ischemic stroke: Left basal ganglia stroke, right thalamic infarct secondary to large vessel disease: Clinical findings, right facial droop, right hemiplegia and dysphagia.  Altered mental status. CT head findings, initially negative.  Follow-up CT scan with new periventricular infarct MRI of the brain, left basal ganglia and right thalamic infarcts.  Brain atrophy. Repeat MRI 4/27, slight increase in size of left basal ganglia stroke, evaluation of right thalamic infarcts. CT of the head and neck, no large vessel occlusion.  Left ICA plaque with 50% stenosis.  Right CEA patent.  Bilateral PCA moderate to severe stenosis. 2D echocardiogram, EF 55%.  Grade 1 diastolic dysfunction.  No emboli. Antiplatelet therapy, on aspirin 81 mg and Eliquis 5 mg twice a day before event, holding for possible procedure.  On therapeutic Lovenox now. LDL, 160 statin, Zocor increased from 10-40 mg .  Slightly elevated AST and ALT, trending down. Hemoglobin A1c, 5.  No indication for treatment. Therapy recommendations, acute inpatient rehab vs SNF pending clinical improvement.  He will not be able to do therapies at inpatient rehab.  Will prepare for a skilled nursing facility after G tube placement.  Dysphagia: Due to acute stroke.  Repeat swallow evaluation with not much improvement. Does have core track feeding tube, 2 weeks now. IR consulted for image guided G-tube placement.  Unable to do. Eliquis and aspirin on hold, last dose 5/4 AM. Consulted surgery, 5/7.  started on therapeutic Lovenox.  Intracranial stenosis: Medical management.  Avoid low blood pressure.  Acute UTI: Unknown present on admission.  Patient growing more than 100,000 colonies of Aerococcus.  Treated with  amoxicillin. 5/6, with leukocytosis.  Persistent.  Aspiration versus UTI.  Sent for urine cultures.  Will treat with Rocephin for presumed aspiration pneumonia versus UTI until cultures are  back.  Ethics Barrie Folk of care:  Palliative care consulted to discuss, coordinate and counseled patient and family. Inserted NG tube and started nutrition. No CPR, limited resuscitation, okay for artificial feeding tube. If surgery unable to do open G-tube placement, will need to [facility] palliative care medicine.  DVT prophylaxis: Lovenox. Code Status: DNR with limited intervention. Family Communication: Son at the bedside. Disposition Plan: Status is: Inpatient  Remains inpatient appropriate because:Inpatient level of care appropriate due to severity of illness.  Pending clinical improvement.  Pending PEG tube placement.   Dispo: The patient is from: Home              Anticipated d/c is to: CIR versus SNF.              Anticipated d/c date is: Unknown.            Patient is not stable to discharge because of need for G tube placement.    Consultants:   Neurology  Palliative medicine  Procedures:   None  Antimicrobials:   None   Subjective: Seen and examined.  No overnight events.  Was able to interact.  Voice is somehow clear.  Son at the bedside.  Falls back sleep quick. Used Ambien at night, will discontinue.  Will use low-dose of melatonin. Not getting agitated as before.   Objective: Vitals:   06/13/19 0406 06/13/19 0800 06/13/19 0851 06/13/19 0856  BP: 127/78  102/60 107/72  Pulse: 88 (!) 102 93 88  Resp: 18 (!) 22 (!) 24 20  Temp: 98.1 F (36.7 C)  98.6 F (37 C) 97.7 F (36.5 C)  TempSrc: Oral  Oral Oral  SpO2: 96% 96% 98% 98%  Weight:        Intake/Output Summary (Last 24 hours) at 06/13/2019 1028 Last data filed at 06/13/2019 0602 Gross per 24 hour  Intake 1822 ml  Output 360 ml  Net 1462 ml   Filed Weights   06/05/19 0349  Weight: 68.3 kg    Examination:  General exam: Appears calm, not in any distress.  Comfortable.  Sick looking.  Somehow uncomfortable with core track tube in the nose. Respiratory system: Clear to auscultation.  Respiratory effort normal.  No added sound.  Pacemaker in place. Cardiovascular system: S1 & S2 heard, RRR.   Gastrointestinal system: Abdomen is nondistended, soft and nontender.  Bowel sounds present.  NG tube in place. Central nervous system:  Left upper and lower extremities normal. right facial droop. Dysphonia, however he is able to understand most of the things and following commands. Right upper extremity 3-4/5 Right lower extremity 4/5 Psychiatry: Judgement and insight appear normal.  Difficulty communicate.    Data Reviewed: I have personally reviewed following labs and imaging studies  CBC: Recent Labs  Lab 06/08/19 0805 06/12/19 0445 06/13/19 0456  WBC 11.4* 21.2* 23.7*  NEUTROABS 8.8*  --  20.8*  HGB 13.8 14.6 14.2  HCT 40.9 43.3 42.9  MCV 101.5* 102.6* 103.9*  PLT 242 316 [id number]   Basic Metabolic Panel: Recent Labs  Lab 06/08/19 0805 06/12/19 0445 06/13/19 0456  NA 139 136 137  K 4.0 4.1 3.9  CL 106 105 101  CO2 24 27 26   GLUCOSE 110* 119* 108*  BUN 30* 26* 27*  CREATININE 0.78 0.85 0.92  CALCIUM 8.2* 7.9* 8.2*  MG 2.1  --  2.0  PHOS 3.5  --  3.3   GFR: Estimated Creatinine Clearance: 53.6 mL/min (by C-G formula based on SCr of 0.92 mg/dL). Liver Function Tests: Recent Labs  Lab 06/08/19 0805 06/13/19 0456  AST 125* 75*  ALT 174* 128*  ALKPHOS 137* 134*  BILITOT 1.2 1.1  PROT 5.2* 5.2*  ALBUMIN 2.3* 2.2*   No results for input(s): LIPASE, AMYLASE in the last 168 hours. No results for input(s): AMMONIA in the last 168 hours. Coagulation Profile: No results for input(s): INR, PROTIME in the last 168 hours. Cardiac Enzymes: No results for input(s): CKTOTAL, CKMB, CKMBINDEX, TROPONINI in the last 168 hours. BNP (last 3 results) No results for input(s): PROBNP in the last 8760 hours. HbA1C: No results for input(s): HGBA1C in the last 72 hours. CBG: Recent Labs  Lab 06/12/19 1556 06/12/19 2025 06/12/19 2328 06/13/19 0402 06/13/19 0738   GLUCAP 116* 118* 133* 130* 126*   Lipid Profile: No results for input(s): CHOL, HDL, LDLCALC, TRIG, CHOLHDL, LDLDIRECT in the last 72 hours. Thyroid Function Tests: No results for input(s): TSH, T4TOTAL, FREET4, T3FREE, THYROIDAB in the last 72 hours. Anemia Panel: No results for input(s): VITAMINB12, FOLATE, FERRITIN, TIBC, IRON, RETICCTPCT in the last 72 hours. Sepsis Labs: No results for input(s): PROCALCITON, LATICACIDVEN in the last 168 hours.  Recent Results (from the past 240 hour(s))  Urine culture     Status: Abnormal   Collection Time: 06/03/19  1:39 PM   Specimen: Urine, Catheterized  Result Value Ref Range Status   Specimen Description URINE, CATHETERIZED  Final   Special Requests NONE  Final   Culture (A)  Final    >=100,000 COLONIES/mL AEROCOCCUS VIRIDANS Standardized susceptibility testing for this organism is not available. Performed at [facility], [place] [address] [place],  [address]    Report Status 06/05/2019 FINAL  Final         Radiology Studies: CT ABDOMEN WO CONTRAST  Result Date: 06/11/2019 CLINICAL DATA:  Dysphagia. Evaluate anatomy prior to potential percutaneous gastrostomy tube placement. EXAM: CT ABDOMEN WITHOUT CONTRAST TECHNIQUE: Multidetector CT imaging of the abdomen was performed following the standard protocol without IV contrast. COMPARISON:  CT abdomen pelvis-06/03/2013; chest CT-03/11/2014 FINDINGS: The lack of intravenous contrast limits the ability to evaluate solid abdominal organs. Examination is further degraded secondary to patient's overlying left upper extremity and patient respiratory artifact. Lower chest: Evaluation of lung bases is degraded secondary to respiratory artifact. Minimal dependent subpleural ground-glass atelectasis. No discrete focal airspace opacities. No pleural effusion. Cardiomegaly. Small pericardial effusion. Pacer leads terminate within the right atrium and right ventricular apex. Coronary artery  calcifications. Hepatobiliary: Normal hepatic contour. Note is made of a approximately 0.9 cm hypoattenuating lesion within the caudal aspect the right lobe of the liver (image 29, series 3), which is incompletely characterized on the present examination though appears morphologically similar to the 05/2013 examination and thus favored to represent a hepatic cyst. Normal noncontrast appearance of the gallbladder given degree of distention. No radiopaque gallstones. No ascites. Pancreas: Normal noncontrast appearance of the pancreas. Spleen: Normal noncontrast appearance of the spleen. Adrenals/Urinary Tract: Punctate (approximately 0.8 cm) partially exophytic hypoattenuating lesion arising from the superomedial aspect the left kidney (image 33, series 3) is too small to accurately characterize though morphologically similar to the 2015 examination and favored to represent a renal cyst. Note is made of 2 punctate (3-4 mm) right-sided renal stones (images 29 and 34, series 3). No discrete left-sided  renal lesions. There is a very minimal amount of grossly symmetric bilateral perinephric stranding, likely age and body habitus related. No urinary obstruction. Normal noncontrast appearance of the bilateral adrenal glands. The urinary bladder was not imaged. Stomach/Bowel: The transverse colon is interposed between the anterior wall of the stomach and ventral abdominal wall. Enteric tube tip terminates within the gastric antrum/duodenal bulb. Enteric contrast is seen within the colon. Moderate colonic stool burden without evidence of enteric obstruction. Normal noncontrast appearance of the terminal ileum. The appendix is not visualized compatible with provided operative history. No pneumoperitoneum, pneumatosis or portal venous gas. Vascular/Lymphatic: Scattered atherosclerotic plaque within normal caliber abdominal aorta. No bulky retroperitoneal or mesenteric lymphadenopathy on this noncontrast examination. Other:  Subcutaneous edema about the midline of the low back with suspected approximately 6.7 x 1.8 cm subcutaneous hematoma about the left side of low back (image 53, series 3), without associated fracture or radiopaque foreign body. Musculoskeletal: Stigmata of dish within the thoracic spine. Mild-to-moderate multilevel lumbar spine DDD, worse at L5-S1 with disc space height loss, endplate irregularity and sclerosis. IMPRESSION: 1. Gastric anatomy NOT amenable to attempted percutaneous gastrostomy tube placement due to interposition of the transverse colon. If gastrostomy tube placement is still desired, surgical consultation is advised. 2. Subcutaneous edema about the midline of the low back with suspected approximately 6.67 cm subcutaneous hematoma about the left-sided low back without associated fracture or radiopaque foreign body. Clinical correlation is advised. 3. Nonobstructing right-sided nephrolithiasis. 4. Coronary calcifications.  Aortic Atherosclerosis (ICD10-I70.0). Electronically Signed   By: Sandi Mariscal M.D.   On: 06/11/2019 17:01        Scheduled Meds:  chlorhexidine  15 mL Mouth Rinse BID   doxazosin  1 mg Per Tube Daily   enoxaparin (LOVENOX) injection  60 mg Subcutaneous Q12H   feeding supplement (PRO-STAT SUGAR FREE 64)  30 mL Per Tube BID   free water  125 mL Per Tube Q4H   mouth rinse  15 mL Mouth Rinse q12n4p   melatonin  2 mg Oral QHS   multivitamin  15 mL Per Tube Daily   polyethylene glycol  17 g Per Tube Daily   simvastatin  40 mg Per Tube q1800   sodium chloride  1 spray Each Nare QID   Continuous Infusions:  cefTRIAXone (ROCEPHIN)  IV 1 g (06/13/19 0747)   feeding supplement (OSMOLITE 1.2 CAL) 1,000 mL (06/13/19 0602)     LOS: 16 days    Time spent: 25 minutes    Barb Merino, MD [facility] Pager [phone]

## 2019-06-13 NOTE — Progress Notes (Signed)
Pharmacy Antibiotic Note  William Mann is a 86 y.o. male admitted on 05/28/2019 with sepsis.  Pharmacy has been consulted for vanc/cefepime dosing.  Pt presented on 4/21 for slurred speech and weakness. He was found to have a CVA. His intake has remained very poor and has been on TF. He is awaiting for a G-tube.   He was previously treated with amoxicillin for his aerococcus UTI. His wbc trended up to 24k today. Vanc/cefepime/flagyl have been ordered for sepsis.   Scr ~1, CrCl ~50 ml/min Goal vanc trough 10-15  Plan: Vanc 1g IV q24 Cefepime 2g IV q12 Trough as needed MRSA PCR  Weight: 68.3 kg (150 lb 9.2 oz)  Temp (24hrs), Avg:98.9 F (37.2 C), Min:97.7 F (36.5 C), Max:99.8 F (37.7 C)  Recent Labs  Lab 06/08/19 0805 06/12/19 0445 06/13/19 0456  WBC 11.4* 21.2* 23.7*  CREATININE 0.78 0.85 0.92    Estimated Creatinine Clearance: 53.6 mL/min (by C-G formula based on SCr of 0.92 mg/dL).    Allergies  Allergen Reactions  . Atorvastatin Other (See Comments)    Liver inflammation    . Rosuvastatin Other (See Comments)    Muscle pain  . Viagra [Sildenafil] Other (See Comments)    Reaction ??    Antimicrobials this admission: 5/1 amox>>5/5 5/7 vanc>> 5/7 cefepime>> 5/7 flagyl>>  Dose adjustments this admission:   Microbiology results: 4/26 blood>>neg 4/27 urine>>aerococcus urinae 5/6 urine>> 5/7 blood>> 5/7 MRSA>>  Onnie Boer, PharmD, [place], AAHIVP, CPP Infectious Disease Pharmacist 06/13/2019 1:42 PM

## 2019-06-13 NOTE — Progress Notes (Signed)
I reevaluated this patient with nursing reporting low blood pressure in the context of elevated white count. On mental status exam, patient is basically at his usual state, intermittently sleeps and intermittently wakes up and ask for ice chips.  He was asking to drink milk. On physical exam, mucosa are dry.  Body exam unchanged, he has ecchymosis on the left flank, has a small swelling on the left paraspinal muscles.  No erythema or redness.  Impression and plan: - Unknown source of infection, will treat as sepsis. 2 L isotonic fluid bolus given, blood pressure responded, lactic acid is normal.  Will check procalcitonin. Source of infection would either be pneumonia versus atelectasis or UTI.  Blood cultures, urine cultures, chest x-ray.  Today we will start him on broad-spectrum antibiotics with aim to gradually taper it off.  -Has developed significant ecchymosis and CT scan evidence of small hematoma on his left back, CT scan with no evidence of intraperitoneal or intramuscular hematoma.  Surgery planning for open G-tube placement Monday.  Developing instability, I will discontinue his Lovenox.  -I again communicated with patient's wife and their son at the bedside that we should give him as much chance and pursue for feeding tube.  He is already DNR and limited intervention. We can watch him how he does over the weekend, if his clinical condition deteriorates, he is appropriate hospice candidate.  Both agree that if his condition deteriorates, they will want to talk to palliative care again.

## 2019-06-13 NOTE — Progress Notes (Signed)
Inpatient Rehabilitation Admissions Coordinator  Noted plans for PEG Monday. I will follow up next week.  Danne Baxter, RN, MSN Rehab Admissions Coordinator [phone] 06/13/2019 12:00 PM

## 2019-06-13 NOTE — Progress Notes (Signed)
William Mann 11-19-1929  [id number].    Requesting MD: Dr. Sloan Leiter Chief Complaint/Reason for Consult: Dysphagia  HPI: William Mann is a 86 y.o. male with a  CAD, HTN, HLD, emphysema, DVT on Eliquis who presented to the ED on 4/21 with stroke like symptoms.  Patient was found to have acute bilateral ischemic strokes.  He was admitted to the hospital.  Patient had a ongoing dysphagia through hospital stay 2/2 stroke.  He currently is receiving tube feeds through [facility].  PT/OT recommending SNF versus CIR.  Patient unable to receive IR G-tube or PEG tube due to anatomy seen on CT scan.  We are asked to consult for laparoscopic versus open G-tube placement. Patient has had prior hernia repair and appendectomy.   ROS: Review of Systems  Unable to perform ROS: Medical condition  Recent Stroke  Family History  Problem Relation Age of Onset  . Heart disease Sister   . Cancer Sister   . Hypertension Sister   . Heart attack Sister   . Heart disease Brother   . Stroke Brother   . Hypertension Brother   . Heart disease Brother   . Hyperlipidemia Son     Past Medical History:  Diagnosis Date  . Aortic atherosclerosis ([place])   . Bilateral hearing loss   . BPH (benign prostatic hyperplasia)   . Cancer ([place])    basal cell carcinoma  . Carotid artery occlusion   . Constipation   . Coronary artery disease   . COVID-19   . DVT (deep venous thrombosis) ([place])   . Emphysema lung ([place])   . Fatty liver   . GERD (gastroesophageal reflux disease)   . History of echocardiogram    Echo 4/17: Vigorous LVEF, EF 65-70%, normal wall motion, grade 1 diastolic dysfunction, mildly dilated ascending aorta (40 mm)  . Hyperlipidemia   . Hypertension   . Insomnia   . PVD (peripheral vascular disease) ([place])   . Thyroid nodule     Past Surgical History:  Procedure Laterality Date  . APPENDECTOMY    . CAROTID ENDARTERECTOMY  03-05-09   right CEA  . COLONOSCOPY  Oct. 2013   one  polyp  . HERNIA REPAIR    . LIPOMA EXCISION    . PACEMAKER IMPLANT N/A 12/23/2018   Procedure: PACEMAKER IMPLANT;  Surgeon: Evans Lance, MD;  Location: [facility];  Service: Cardiovascular;  Laterality: N/A;    Social History:  reports that he has never smoked. He has never used smokeless tobacco. He reports that he does not drink alcohol or use drugs.  Allergies:  Allergies  Allergen Reactions  . Atorvastatin Other (See Comments)    Liver inflammation    . Rosuvastatin Other (See Comments)    Muscle pain  . Viagra [Sildenafil] Other (See Comments)    Reaction ??    Medications Prior to Admission  Medication Sig Dispense Refill  . apixaban (ELIQUIS) 5 MG TABS tablet Take 1 tablet (5 mg total) by mouth 2 (two) times daily. Resume 12/27/18 evening dose (Patient taking differently: Take 5 mg by mouth 2 (two) times daily. ) 60 tablet 1  . aspirin EC 81 MG tablet Take 81 mg by mouth 2 (two) times a week.     . Calcium Carbonate (CALTRATE 600 PO) Take 600 mg by mouth 2 (two) times a week.     . Cholecalciferol (VITAMIN D-3 PO) Take 1 capsule by mouth daily.     Marland Kitchen  Coenzyme Q10 (COQ-10) 100 MG CAPS Take 100 mg by mouth daily.     . cyanocobalamin 100 MCG tablet Take 100 mcg by mouth daily.    . Multiple Vitamin (MULTIVITAMIN) tablet Take 1 tablet by mouth daily.    Marland Kitchen senna (SENOKOT) 8.6 MG tablet Take 1 tablet by mouth 2 (two) times daily as needed for constipation.    . simvastatin (ZOCOR) 10 MG tablet Take 10 mg by mouth at bedtime.    . tamsulosin (FLOMAX) 0.4 MG CAPS capsule Take 0.4 mg by mouth daily.    Marland Kitchen zolpidem (AMBIEN) 5 MG tablet Take 5 mg by mouth at bedtime as needed for sleep.       Physical Exam: Blood pressure 109/84, pulse 80, temperature 98.4 F (36.9 C), temperature source Axillary, resp. rate 17, weight 68.3 kg, SpO2 96 %. General: frail elderly male who is laying in bed in NAD HEENT: head is normocephalic, atraumatic.  Sclera are noninjected.  PERRL.   Ears and nose without any masses or lesions.  Mouth is pink and moist. Dentition poor Heart: regular, rate, and rhythm. No obvious mumur noted.  Palpable radial pulses bilaterally  Lungs: CTAB, no wheezes, rhonchi, or rales noted.  Respiratory effort nonlabored Abd: Soft, NT/ND, +BS, no masses, hernias, or organomegaly. Prior abdominal scars well healed.  MS: no BUE/BLE edema, calves soft and nontender Skin: warm and dry with no masses, lesions, or rashes Psych: A&Ox2 (self and place) with an appropriate affect  Results for orders placed or performed during the hospital encounter of 05/28/19 (from the past 48 hour(s))  Glucose, capillary     Status: Abnormal   Collection Time: 06/11/19 12:05 PM  Result Value Ref Range   Glucose-Capillary 100 (H) 70 - 99 mg/dL    Comment: Glucose reference range applies only to samples taken after fasting for at least 8 hours.  Glucose, capillary     Status: Abnormal   Collection Time: 06/11/19  4:56 PM  Result Value Ref Range   Glucose-Capillary 121 (H) 70 - 99 mg/dL    Comment: Glucose reference range applies only to samples taken after fasting for at least 8 hours.  Glucose, capillary     Status: Abnormal   Collection Time: 06/11/19  8:43 PM  Result Value Ref Range   Glucose-Capillary 120 (H) 70 - 99 mg/dL    Comment: Glucose reference range applies only to samples taken after fasting for at least 8 hours.  Glucose, capillary     Status: Abnormal   Collection Time: 06/12/19 12:25 AM  Result Value Ref Range   Glucose-Capillary 128 (H) 70 - 99 mg/dL    Comment: Glucose reference range applies only to samples taken after fasting for at least 8 hours.  Glucose, capillary     Status: Abnormal   Collection Time: 06/12/19  4:17 AM  Result Value Ref Range   Glucose-Capillary 116 (H) 70 - 99 mg/dL    Comment: Glucose reference range applies only to samples taken after fasting for at least 8 hours.  Basic metabolic panel     Status: Abnormal   Collection  Time: 06/12/19  4:45 AM  Result Value Ref Range   Sodium 136 135 - 145 mmol/L   Potassium 4.1 3.5 - 5.1 mmol/L   Chloride 105 98 - 111 mmol/L   CO2 27 22 - 32 mmol/L   Glucose, Bld 119 (H) 70 - 99 mg/dL    Comment: Glucose reference range applies only to samples taken after fasting  for at least 8 hours.   BUN 26 (H) 8 - 23 mg/dL   Creatinine, Ser 0.85 0.61 - 1.24 mg/dL   Calcium 7.9 (L) 8.9 - 10.3 mg/dL   GFR calc non Af Amer >60 >60 mL/min   GFR calc Af Amer >60 >60 mL/min   Anion gap 4 (L) 5 - 15    Comment: Performed at [facility] [address] [place], [place] [address]  CBC     Status: Abnormal   Collection Time: 06/12/19  4:45 AM  Result Value Ref Range   WBC 21.2 (H) 4.0 - 10.5 K/uL   RBC 4.22 4.22 - 5.81 MIL/uL   Hemoglobin 14.6 13.0 - 17.0 g/dL   HCT 43.3 39.0 - 52.0 %   MCV 102.6 (H) 80.0 - 100.0 fL   MCH 34.6 (H) 26.0 - 34.0 pg   MCHC 33.7 30.0 - 36.0 g/dL   RDW 13.2 11.5 - 15.5 %   Platelets 316 150 - 400 K/uL   nRBC 0.0 0.0 - 0.2 %    Comment: Performed at [facility], [place] [address], [place], [place] [address]  Glucose, capillary     Status: Abnormal   Collection Time: 06/12/19  7:29 AM  Result Value Ref Range   Glucose-Capillary 109 (H) 70 - 99 mg/dL    Comment: Glucose reference range applies only to samples taken after fasting for at least 8 hours.  Glucose, capillary     Status: Abnormal   Collection Time: 06/12/19 12:17 PM  Result Value Ref Range   Glucose-Capillary 128 (H) 70 - 99 mg/dL    Comment: Glucose reference range applies only to samples taken after fasting for at least 8 hours.  Glucose, capillary     Status: Abnormal   Collection Time: 06/12/19  3:56 PM  Result Value Ref Range   Glucose-Capillary 116 (H) 70 - 99 mg/dL    Comment: Glucose reference range applies only to samples taken after fasting for at least 8 hours.  Glucose, capillary     Status: Abnormal   Collection Time: 06/12/19  8:25 PM  Result Value Ref Range    Glucose-Capillary 118 (H) 70 - 99 mg/dL    Comment: Glucose reference range applies only to samples taken after fasting for at least 8 hours.   Comment 1 Notify RN    Comment 2 Document in Chart   Glucose, capillary     Status: Abnormal   Collection Time: 06/12/19 11:28 PM  Result Value Ref Range   Glucose-Capillary 133 (H) 70 - 99 mg/dL    Comment: Glucose reference range applies only to samples taken after fasting for at least 8 hours.   Comment 1 Notify RN    Comment 2 Document in Chart   Glucose, capillary     Status: Abnormal   Collection Time: 06/13/19  4:02 AM  Result Value Ref Range   Glucose-Capillary 130 (H) 70 - 99 mg/dL    Comment: Glucose reference range applies only to samples taken after fasting for at least 8 hours.   Comment 1 Notify RN    Comment 2 Document in Chart   Comprehensive metabolic panel     Status: Abnormal   Collection Time: 06/13/19  4:56 AM  Result Value Ref Range   Sodium 137 135 - 145 mmol/L   Potassium 3.9 3.5 - 5.1 mmol/L   Chloride 101 98 - 111 mmol/L   CO2 26 22 - 32 mmol/L   Glucose,  Bld 108 (H) 70 - 99 mg/dL    Comment: Glucose reference range applies only to samples taken after fasting for at least 8 hours.   BUN 27 (H) 8 - 23 mg/dL   Creatinine, Ser 0.92 0.61 - 1.24 mg/dL   Calcium 8.2 (L) 8.9 - 10.3 mg/dL   Total Protein 5.2 (L) 6.5 - 8.1 g/dL   Albumin 2.2 (L) 3.5 - 5.0 g/dL   AST 75 (H) 15 - 41 U/L   ALT 128 (H) 0 - 44 U/L   Alkaline Phosphatase 134 (H) 38 - 126 U/L   Total Bilirubin 1.1 0.3 - 1.2 mg/dL   GFR calc non Af Amer >60 >60 mL/min   GFR calc Af Amer >60 >60 mL/min   Anion gap 10 5 - 15    Comment: Performed at [facility], [place] [address], [place], [place] [address]  CBC with Differential/Platelet     Status: Abnormal   Collection Time: 06/13/19  4:56 AM  Result Value Ref Range   WBC 23.7 (H) 4.0 - 10.5 K/uL   RBC 4.13 (L) 4.22 - 5.81 MIL/uL   Hemoglobin 14.2 13.0 - 17.0 g/dL   HCT 42.9 39.0 - 52.0 %   MCV  103.9 (H) 80.0 - 100.0 fL   MCH 34.4 (H) 26.0 - 34.0 pg   MCHC 33.1 30.0 - 36.0 g/dL   RDW 13.4 11.5 - 15.5 %   Platelets 265 150 - 400 K/uL   nRBC 0.0 0.0 - 0.2 %   Neutrophils Relative % 87 %   Neutro Abs 20.8 (H) 1.7 - 7.7 K/uL   Lymphocytes Relative 6 %   Lymphs Abs 1.3 0.7 - 4.0 K/uL   Monocytes Relative 6 %   Monocytes Absolute 1.3 (H) 0.1 - 1.0 K/uL   Eosinophils Relative 0 %   Eosinophils Absolute 0.1 0.0 - 0.5 K/uL   Basophils Relative 0 %   Basophils Absolute 0.1 0.0 - 0.1 K/uL   Immature Granulocytes 1 %   Abs Immature Granulocytes 0.15 (H) 0.00 - 0.07 K/uL    Comment: Performed at [facility] [address], [place], [place] [address]  Magnesium     Status: None   Collection Time: 06/13/19  4:56 AM  Result Value Ref Range   Magnesium 2.0 1.7 - 2.4 mg/dL    Comment: Performed at [facility] [address] [place],  [address]  Phosphorus     Status: None   Collection Time: 06/13/19  4:56 AM  Result Value Ref Range   Phosphorus 3.3 2.5 - 4.6 mg/dL    Comment: Performed at [facility] [address], [place], [place] [address]  Glucose, capillary     Status: Abnormal   Collection Time: 06/13/19  7:38 AM  Result Value Ref Range   Glucose-Capillary 126 (H) 70 - 99 mg/dL    Comment: Glucose reference range applies only to samples taken after fasting for at least 8 hours.   CT ABDOMEN WO CONTRAST  Result Date: 06/11/2019 CLINICAL DATA:  Dysphagia. Evaluate anatomy prior to potential percutaneous gastrostomy tube placement. EXAM: CT ABDOMEN WITHOUT CONTRAST TECHNIQUE: Multidetector CT imaging of the abdomen was performed following the standard protocol without IV contrast. COMPARISON:  CT abdomen pelvis-06/03/2013; chest CT-03/11/2014 FINDINGS: The lack of intravenous contrast limits the ability to evaluate solid abdominal organs. Examination is further degraded secondary to patient's overlying left upper extremity and patient respiratory  artifact. Lower chest: Evaluation of  lung bases is degraded secondary to respiratory artifact. Minimal dependent subpleural ground-glass atelectasis. No discrete focal airspace opacities. No pleural effusion. Cardiomegaly. Small pericardial effusion. Pacer leads terminate within the right atrium and right ventricular apex. Coronary artery calcifications. Hepatobiliary: Normal hepatic contour. Note is made of a approximately 0.9 cm hypoattenuating lesion within the caudal aspect the right lobe of the liver (image 29, series 3), which is incompletely characterized on the present examination though appears morphologically similar to the 05/2013 examination and thus favored to represent a hepatic cyst. Normal noncontrast appearance of the gallbladder given degree of distention. No radiopaque gallstones. No ascites. Pancreas: Normal noncontrast appearance of the pancreas. Spleen: Normal noncontrast appearance of the spleen. Adrenals/Urinary Tract: Punctate (approximately 0.8 cm) partially exophytic hypoattenuating lesion arising from the superomedial aspect the left kidney (image 33, series 3) is too small to accurately characterize though morphologically similar to the 2015 examination and favored to represent a renal cyst. Note is made of 2 punctate (3-4 mm) right-sided renal stones (images 29 and 34, series 3). No discrete left-sided renal lesions. There is a very minimal amount of grossly symmetric bilateral perinephric stranding, likely age and body habitus related. No urinary obstruction. Normal noncontrast appearance of the bilateral adrenal glands. The urinary bladder was not imaged. Stomach/Bowel: The transverse colon is interposed between the anterior wall of the stomach and ventral abdominal wall. Enteric tube tip terminates within the gastric antrum/duodenal bulb. Enteric contrast is seen within the colon. Moderate colonic stool burden without evidence of enteric obstruction. Normal noncontrast appearance of  the terminal ileum. The appendix is not visualized compatible with provided operative history. No pneumoperitoneum, pneumatosis or portal venous gas. Vascular/Lymphatic: Scattered atherosclerotic plaque within normal caliber abdominal aorta. No bulky retroperitoneal or mesenteric lymphadenopathy on this noncontrast examination. Other: Subcutaneous edema about the midline of the low back with suspected approximately 6.7 x 1.8 cm subcutaneous hematoma about the left side of low back (image 53, series 3), without associated fracture or radiopaque foreign body. Musculoskeletal: Stigmata of dish within the thoracic spine. Mild-to-moderate multilevel lumbar spine DDD, worse at L5-S1 with disc space height loss, endplate irregularity and sclerosis. IMPRESSION: 1. Gastric anatomy NOT amenable to attempted percutaneous gastrostomy tube placement due to interposition of the transverse colon. If gastrostomy tube placement is still desired, surgical consultation is advised. 2. Subcutaneous edema about the midline of the low back with suspected approximately 6.67 cm subcutaneous hematoma about the left-sided low back without associated fracture or radiopaque foreign body. Clinical correlation is advised. 3. Nonobstructing right-sided nephrolithiasis. 4. Coronary calcifications.  Aortic Atherosclerosis (ICD10-I70.0). Electronically Signed   By: Sandi Mariscal M.D.   On: 06/11/2019 17:01   Anti-infectives (From admission, onward)   Start     Dose/Rate Route Frequency Ordered Stop   06/13/19 0800  cefTRIAXone (ROCEPHIN) 1 g in sodium chloride 0.9 % 100 mL IVPB     1 g 200 mL/hr over 30 Minutes Intravenous Every 24 hours 06/13/19 0708     06/07/19 1515  amoxicillin (AMOXIL) 250 MG/5ML suspension 500 mg     500 mg Per Tube Every 8 hours 06/07/19 1514 06/11/19 2131   06/07/19 1400  amoxicillin (AMOXIL) 250 MG/5ML suspension 500 mg  Status:  Discontinued     500 mg Oral Every 8 hours 06/07/19 1036 06/07/19 1514       Assessment/Plan - UTI - Acute bilateral ischemic stroke: Left basal ganglia stroke, right thalamic infarct secondary to large vessel disease  Dysphagia  -Patient not amenable to IR  G-tube placement or PEG placement due to anatomy -Will plan for laparoscopic versus open G-tube placement on Monday if timing allows.  I discussed with family risks of surgery including but not limited to anesthesia (MI, CVA, prolonged intubation, death), bleeding, injury to surrounding organs/structures, and infection.  Patient is currently on full dose Lovenox. This will need to be held 24 hours prior to procedure.   FEN - NPO, TF's. Hold TF's after midnight the morning prior to surgery VTE - SCDs, Lovenox (hold therapeutic Lovenox 24 hours prior to surgery) ID - Rocephin per [place], [facility] 06/13/2019, 11:22 AM Please see Amion for pager number during day hours 7:00am-4:30pm

## 2019-06-14 DIAGNOSIS — J690 Pneumonitis due to inhalation of food and vomit: Secondary | ICD-10-CM

## 2019-06-14 LAB — GLUCOSE, CAPILLARY
Glucose-Capillary: 101 mg/dL — ABNORMAL HIGH (ref 70–99)
Glucose-Capillary: 102 mg/dL — ABNORMAL HIGH (ref 70–99)
Glucose-Capillary: 107 mg/dL — ABNORMAL HIGH (ref 70–99)
Glucose-Capillary: 109 mg/dL — ABNORMAL HIGH (ref 70–99)
Glucose-Capillary: 111 mg/dL — ABNORMAL HIGH (ref 70–99)
Glucose-Capillary: 115 mg/dL — ABNORMAL HIGH (ref 70–99)

## 2019-06-14 LAB — CBC WITH DIFFERENTIAL/PLATELET
Abs Immature Granulocytes: 0.17 10*3/uL — ABNORMAL HIGH (ref 0.00–0.07)
Basophils Absolute: 0.1 10*3/uL (ref 0.0–0.1)
Basophils Relative: 0 %
Eosinophils Absolute: 0 10*3/uL (ref 0.0–0.5)
Eosinophils Relative: 0 %
HCT: 40.7 % (ref 39.0–52.0)
Hemoglobin: 13.6 g/dL (ref 13.0–17.0)
Immature Granulocytes: 1 %
Lymphocytes Relative: 3 %
Lymphs Abs: 0.8 10*3/uL (ref 0.7–4.0)
MCH: 34.3 pg — ABNORMAL HIGH (ref 26.0–34.0)
MCHC: 33.4 g/dL (ref 30.0–36.0)
MCV: 102.8 fL — ABNORMAL HIGH (ref 80.0–100.0)
Monocytes Absolute: 1.5 10*3/uL — ABNORMAL HIGH (ref 0.1–1.0)
Monocytes Relative: 7 %
Neutro Abs: 20 10*3/uL — ABNORMAL HIGH (ref 1.7–7.7)
Neutrophils Relative %: 89 %
Platelets: 209 10*3/uL (ref 150–400)
RBC: 3.96 MIL/uL — ABNORMAL LOW (ref 4.22–5.81)
RDW: 13.2 % (ref 11.5–15.5)
WBC: 22.5 10*3/uL — ABNORMAL HIGH (ref 4.0–10.5)
nRBC: 0 % (ref 0.0–0.2)

## 2019-06-14 LAB — URINE CULTURE: Culture: NO GROWTH

## 2019-06-14 LAB — PROCALCITONIN: Procalcitonin: 0.27 ng/mL

## 2019-06-14 MED ORDER — METOCLOPRAMIDE HCL 5 MG/ML IJ SOLN
10.0000 mg | Freq: Once | INTRAMUSCULAR | Status: AC
  Administered 2019-06-14: 10 mg via INTRAVENOUS
  Filled 2019-06-14: qty 2

## 2019-06-14 MED ORDER — ONDANSETRON HCL 4 MG/2ML IJ SOLN: 4.0000 mg | Freq: Four times a day (QID) | INTRAMUSCULAR | Status: DC | PRN

## 2019-06-14 MED ORDER — MELATONIN 3 MG PO TABS
3.0000 mg | ORAL_TABLET | Freq: Every day | ORAL | Status: DC
  Administered 2019-06-14 – 2019-06-18 (×5): 3 mg
  Filled 2019-06-14 (×5): qty 1

## 2019-06-14 NOTE — Progress Notes (Signed)
   06/14/19 0750  Assess: MEWS Score  Temp 98.6 F (37 C)  BP (!) 119/59  Pulse Rate 78  ECG Heart Rate 87  Resp (!) 22  Level of Consciousness Alert  SpO2 94 %  O2 Device Room Air  Patient Activity (if Appropriate) In bed  Assess: MEWS Score  MEWS Temp 0  MEWS Systolic 0  MEWS Pulse 0  MEWS RR 1  MEWS LOC 0  MEWS Score 1  MEWS Score Color Green  Assess: if the MEWS score is Yellow or Red  Were vital signs taken at a resting state? Yes  Focused Assessment Documented focused assessment  Early Detection of Sepsis Score *See Row Information* High  MEWS guidelines implemented *See Row Information* No, previously yellow, continue vital signs every 4 hours  Treat  MEWS Interventions Other (Comment) (VS recheced)  Take Vital Signs  Increase Vital Sign Frequency  Yellow: Q 2hr X 2 then Q 4hr X 2, if remains yellow, continue Q 4hrs (Out of q2hrs, will keep routine.)  Escalate  MEWS: Escalate Yellow: discuss with charge nurse/RN and consider discussing with provider and RRT  Notify: Charge Nurse/RN  Name of Charge Nurse/RN Notified Angela, RN  Date Charge Nurse/RN Notified 06/14/19  Time Charge Nurse/RN Notified [phone]

## 2019-06-14 NOTE — Progress Notes (Signed)
   06/14/19 1132  Assess: MEWS Score  Temp 97.8 F (36.6 C)  BP (!) 96/58  Pulse Rate 84  ECG Heart Rate 82  Resp (!) 21  Level of Consciousness Alert  SpO2 95 %  O2 Device Room Air  Patient Activity (if Appropriate) In bed  Assess: MEWS Score  MEWS Temp 0  MEWS Systolic 1  MEWS Pulse 0  MEWS RR 1  MEWS LOC 0  MEWS Score 2  MEWS Score Color Yellow

## 2019-06-14 NOTE — Progress Notes (Signed)
Orders placed and EKG done. TF on hold for now. Total of 3 green emesis and 4 jelly-like, brown BM.

## 2019-06-14 NOTE — Progress Notes (Signed)
[facility]  PROGRESS NOTE  William Mann [id number] DOB: 06-18-1929 DOA: 05/28/2019 PCP: Patient, No Pcp Per   Brief HPI:   85 year old male with history of muscular disease, hypertension, hyperlipidemia, DVT on Eliquis, permanent pacemaker, carotid stenosis status post right carotid endarterectomy, COVID-19 infection in January 2021 came to ED with slurred speech and facial droop as well as recurrent falls.  Initially came to ED ER on 05/27/2019 with a fall at that time he had negative neuro exam and was discharged home.  Subsequently developed slurred speech, unsteady gait, right leg weakness, right facial droop when coughing and choking after eating so he was brought back to the ER.  MRI brain showed small acute infarct in left basal ganglia/corona radiata and right thalamus.  Patient remained in the hospital with fluctuating mental status.  Patient was started on core track feeding tube.  IR was unable to image guided G-tube placement because of transverse colon front of stoma.  General surgery has been consulted.  Plan for G-tube placement on 06/16/2019.    Subjective   Patient seen and examined, tube feed was stopped last night due to recurrent vomiting.   Assessment/Plan:     1. Acute bilateral ischemic strokes-left with secondary stroke, right thalamic infarct secondary to large vessel disease.  Patient has right facial droop, right hemiplegia and dysphagia with altered mental status.  Initial CT head was negative, follow-up CT scan showed new periventricular infarct.  MRI brain showed left basal ganglia and right thalamic infarcts.  Brain atrophy.  Repeat MRI on 06/03/2019 showed slight increase in left raising the stroke, evaluation of right chronic infarct.  Patient is currently on aspirin 81 mg daily, Eliquis 5 mg twice daily, which is currently on hold for G-tube placement.  Patient is on therapeutic Lovenox.  Continue Zocor 40 mg daily, hemoglobin A1c is 5.  Therapy  recommends inpatient rehab versus skilled nursing facility.  Patient likely will go to skilled nursing facility after G-tube placement. 2. Dysphagia-secondary to acute stroke.  Repeat swallow evaluation did not show much improvement.  Patient has core track tube feeding for past 2 weeks.  IR was consulted for guided G-tube placement.  Eliquis and aspirin are currently on hold.  Eliquis and aspirin on hold.  Last dose was on 06/10/2019.  General surgery consulted on 06/13/2019.  Started on therapeutic Lovenox.  Plan for G-tube placement on 06/16/2019. 3. UTI-urine culture grew more than 100,000 colonies of Aerococcus.  Treated with amoxicillin.  Repeat urine culture on 06/13/2019 showed no growth. 4. Aspiration pneumonia-patient developed leukocytosis on 06/12/2019, chest x-ray consistent with pneumonia.  Patient started on vancomycin, cefepime.  Urine and blood culture  is negative to date. 5. Goals of care-palliative care was consulted, family agreed for no CPR, limited resuscitation.  Okay for artificial feeding tube.  If surgery unable to perform G-tube placement, will need to [facility] palliative care.    SpO2: 94 %   COVID-19 Labs  No results for input(s): DDIMER, FERRITIN, LDH, CRP in the last 72 hours.  Lab Results  Component Value Date   SARSCOV2NAA NEGATIVE 05/28/2019   SARSCOV2NAA POSITIVE (A) 02/28/2019   SARSCOV2NAA NEGATIVE 12/25/2018   [place] NEGATIVE 12/20/2018     CBG: Recent Labs  Lab 06/13/19 1922 06/13/19 2340 06/14/19 0313 06/14/19 0733 06/14/19 1223  GLUCAP 108* 124* 107* 101* 115*    CBC: Recent Labs  Lab 06/08/19 0805 06/12/19 0445 06/13/19 0456 06/14/19 0329  WBC 11.4* 21.2* 23.7* 22.5*  NEUTROABS 8.8*  --  20.8* 20.0*  HGB 13.8 14.6 14.2 13.6  HCT 40.9 43.3 42.9 40.7  MCV 101.5* 102.6* 103.9* 102.8*  PLT 242 316 265 [id number]    Basic Metabolic Panel: Recent Labs  Lab 06/08/19 0805 06/12/19 0445 06/13/19 0456  NA 139 136 137  K 4.0 4.1 3.9  CL 106  105 101  CO2 24 27 26   GLUCOSE 110* 119* 108*  BUN 30* 26* 27*  CREATININE 0.78 0.85 0.92  CALCIUM 8.2* 7.9* 8.2*  MG 2.1  --  2.0  PHOS 3.5  --  3.3     Liver Function Tests: Recent Labs  Lab 06/08/19 0805 06/13/19 0456  AST 125* 75*  ALT 174* 128*  ALKPHOS 137* 134*  BILITOT 1.2 1.1  PROT 5.2* 5.2*  ALBUMIN 2.3* 2.2*        DVT prophylaxis: Lovenox  Code Status: DNR  Family Communication: No family at bedside  Disposition Plan:   Status is: Inpatient  Dispo: The patient is from: Home              Anticipated d/c is to: CIR versus skilled nursing facility              Anticipated d/c date is: 06/18/2019              Patient currently admitted with acute bilateral ischemic strokes, dysphagia.  Barrier to discharge-pending placement of G-tube        Scheduled medications:  . chlorhexidine  15 mL Mouth Rinse BID  . doxazosin  1 mg Per Tube Daily  . feeding supplement (PRO-STAT SUGAR FREE 64)  30 mL Per Tube BID  . free water  125 mL Per Tube Q4H  . mouth rinse  15 mL Mouth Rinse q12n4p  . melatonin  3 mg Per Tube QHS  . multivitamin  15 mL Per Tube Daily  . polyethylene glycol  17 g Per Tube Daily  . simvastatin  40 mg Per Tube q1800  . sodium chloride  1 spray Each Nare QID    Consultants:  IR  General surgery  Neurology  Palliative care  Procedures:  None  Antibiotics:   Anti-infectives (From admission, onward)   Start     Dose/Rate Route Frequency Ordered Stop   06/14/19 1200  vancomycin (VANCOCIN) IVPB 1000 mg/200 mL premix     1,000 mg 200 mL/hr over 60 Minutes Intravenous Every 24 hours 06/13/19 1343     06/14/19 0400  ceFEPIme (MAXIPIME) 2 g in sodium chloride 0.9 % 100 mL IVPB     2 g 200 mL/hr over 30 Minutes Intravenous Every 12 hours 06/13/19 1343     06/13/19 1430  vancomycin (VANCOCIN) IVPB 1000 mg/200 mL premix     1,000 mg 200 mL/hr over 60 Minutes Intravenous  Once 06/13/19 1309 06/13/19 1603   06/13/19 1400   ceFEPIme (MAXIPIME) 2 g in sodium chloride 0.9 % 100 mL IVPB     2 g 200 mL/hr over 30 Minutes Intravenous  Once 06/13/19 1309 06/13/19 1447   06/13/19 1400  metroNIDAZOLE (FLAGYL) IVPB 500 mg     500 mg 100 mL/hr over 60 Minutes Intravenous Every 8 hours 06/13/19 1309     06/13/19 0800  cefTRIAXone (ROCEPHIN) 1 g in sodium chloride 0.9 % 100 mL IVPB  Status:  Discontinued     1 g 200 mL/hr over 30 Minutes Intravenous Every 24 hours 06/13/19 0708 06/13/19 1308   06/07/19 1515  amoxicillin (AMOXIL) 250 MG/5ML suspension 500  mg     500 mg Per Tube Every 8 hours 06/07/19 1514 06/11/19 2131   06/07/19 1400  amoxicillin (AMOXIL) 250 MG/5ML suspension 500 mg  Status:  Discontinued     500 mg Oral Every 8 hours 06/07/19 1036 06/07/19 1514       Objective   Vitals:   06/14/19 0935 06/14/19 0937 06/14/19 1132 06/14/19 1134  BP: 108/61 119/72 (!) 96/58 93/61  Pulse: 90 90 84 72  Resp: (!) 22 (!) 22 (!) 21 20  Temp: 99.1 F (37.3 C) 99 F (37.2 C) 97.8 F (36.6 C) 98.2 F (36.8 C)  TempSrc: Axillary Axillary Axillary   SpO2: 94% 94% 95% 94%  Weight:        Intake/Output Summary (Last 24 hours) at 06/14/2019 1337 Last data filed at 06/14/2019 0330 Gross per 24 hour  Intake 1208.07 ml  Output 300 ml  Net 908.07 ml    05/06 1901 - 05/08 0700 In: 2315.1  Out: 300 [Urine:300]  Filed Weights   06/05/19 0349  Weight: 68.3 kg    Physical Examination:    General: Appears lethargic  Cardiovascular: S1-S2, regular, no murmur auscultated  Respiratory: Clear to auscultation bilaterally  Abdomen: Abdomen is soft, nontender, no organomegaly  Extremities: No edema in the lower extremities  Neurologic: Somnolent, but arousable    Data Reviewed:   Recent Results (from the past 240 hour(s))  Culture, Urine     Status: Abnormal   Collection Time: 06/12/19  2:38 PM   Specimen: Urine, Clean Catch  Result Value Ref Range Status   Specimen Description URINE, CLEAN CATCH  Final    Special Requests   Final    NONE Performed at [facility], [address], [place], [place] [address]    Culture MULTIPLE SPECIES PRESENT, SUGGEST RECOLLECTION (A)  Final   Report Status 06/13/2019 FINAL  Final  Culture, blood (routine x 2)     Status: None (Preliminary result)   Collection Time: 06/13/19 12:25 PM   Specimen: BLOOD  Result Value Ref Range Status   Specimen Description BLOOD RIGHT ANTECUBITAL  Final   Special Requests   Final    BOTTLES DRAWN AEROBIC AND ANAEROBIC Blood Culture adequate volume   Culture   Final    NO GROWTH < 24 HOURS Performed at [facility], [place] [address] [place], [place] [address]    Report Status PENDING  Incomplete  Culture, blood (routine x 2)     Status: None (Preliminary result)   Collection Time: 06/13/19 12:30 PM   Specimen: BLOOD  Result Value Ref Range Status   Specimen Description BLOOD LEFT ANTECUBITAL  Final   Special Requests   Final    BOTTLES DRAWN AEROBIC AND ANAEROBIC Blood Culture adequate volume   Culture   Final    NO GROWTH < 24 HOURS Performed at [facility], [place] [address], [place], [place] [address]    Report Status PENDING  Incomplete  Culture, Urine     Status: None   Collection Time: 06/13/19  1:35 PM   Specimen: Urine, Clean Catch  Result Value Ref Range Status   Specimen Description URINE, CLEAN CATCH  Final   Special Requests NONE  Final   Culture   Final    NO GROWTH Performed at [facility], [place] [address], [place], [place] [address]    Report Status 06/14/2019 FINAL  Final     Studies:  DG CHEST PORT 1  VIEW  Result Date: 06/13/2019 CLINICAL DATA:  Pneumonia EXAM: PORTABLE CHEST 1 VIEW COMPARISON:  CT 06/11/2019, chest radiograph 06/05/2019, 12/24/2018 FINDINGS: Slight increased ground-glass opacity at the peripheral left lung base. Low lung volumes. Enlarged cardiomediastinal silhouette. Stable left-sided pacing device. Esophageal tube tip overlies the distal stomach. Mild  atelectasis at the right base. IMPRESSION: New small focal opacity at the peripheral left lung base may reflect a small focus of pneumonia. Cardiomegaly without edema Electronically Signed   By: Donavan Foil M.D.   On: 06/13/2019 16:20       [place]   [facility] If 7PM-7AM, please contact night-coverage at [email or web address], Office  [phone]   06/14/2019, 1:37 PM  LOS: 17 days

## 2019-06-15 LAB — CBC WITH DIFFERENTIAL/PLATELET
Abs Immature Granulocytes: 0.04 10*3/uL (ref 0.00–0.07)
Basophils Absolute: 0.1 10*3/uL (ref 0.0–0.1)
Basophils Relative: 1 %
Eosinophils Absolute: 0.1 10*3/uL (ref 0.0–0.5)
Eosinophils Relative: 1 %
HCT: 36.6 % — ABNORMAL LOW (ref 39.0–52.0)
Hemoglobin: 12.3 g/dL — ABNORMAL LOW (ref 13.0–17.0)
Immature Granulocytes: 0 %
Lymphocytes Relative: 9 %
Lymphs Abs: 0.9 10*3/uL (ref 0.7–4.0)
MCH: 34.3 pg — ABNORMAL HIGH (ref 26.0–34.0)
MCHC: 33.6 g/dL (ref 30.0–36.0)
MCV: 101.9 fL — ABNORMAL HIGH (ref 80.0–100.0)
Monocytes Absolute: 0.8 10*3/uL (ref 0.1–1.0)
Monocytes Relative: 8 %
Neutro Abs: 7.4 10*3/uL (ref 1.7–7.7)
Neutrophils Relative %: 81 %
Platelets: 159 10*3/uL (ref 150–400)
RBC: 3.59 MIL/uL — ABNORMAL LOW (ref 4.22–5.81)
RDW: 13.1 % (ref 11.5–15.5)
WBC: 9.2 10*3/uL (ref 4.0–10.5)
nRBC: 0 % (ref 0.0–0.2)

## 2019-06-15 LAB — GLUCOSE, CAPILLARY
Glucose-Capillary: 116 mg/dL — ABNORMAL HIGH (ref 70–99)
Glucose-Capillary: 109 mg/dL — ABNORMAL HIGH (ref 70–99)
Glucose-Capillary: 124 mg/dL — ABNORMAL HIGH (ref 70–99)
Glucose-Capillary: 106 mg/dL — ABNORMAL HIGH (ref 70–99)
Glucose-Capillary: 116 mg/dL — ABNORMAL HIGH (ref 70–99)

## 2019-06-15 LAB — PROCALCITONIN: Procalcitonin: 0.11 ng/mL

## 2019-06-15 MED ORDER — SODIUM CHLORIDE 0.9% FLUSH: 10.0000 mL | INTRAVENOUS | Status: DC | PRN

## 2019-06-15 MED ORDER — SODIUM CHLORIDE 0.9% FLUSH
10.0000 mL | Freq: Two times a day (BID) | INTRAVENOUS | Status: DC
  Administered 2019-06-15 – 2019-06-25 (×14): 10 mL

## 2019-06-15 NOTE — Progress Notes (Signed)
[facility]  PROGRESS NOTE  William Mann [id number] DOB: 1929-12-01 DOA: 05/28/2019 PCP: Patient, No Pcp Per   Brief HPI:   86 year old male with history of muscular disease, hypertension, hyperlipidemia, DVT on Eliquis, permanent pacemaker, carotid stenosis status post right carotid endarterectomy, COVID-19 infection in January 2021 came to ED with slurred speech and facial droop as well as recurrent falls.  Initially came to ED ER on 05/27/2019 with a fall at that time he had negative neuro exam and was discharged home.  Subsequently developed slurred speech, unsteady gait, right leg weakness, right facial droop when coughing and choking after eating so he was brought back to the ER.  MRI brain showed small acute infarct in left basal ganglia/corona radiata and right thalamus.  Patient remained in the hospital with fluctuating mental status.  Patient was started on core track feeding tube.  IR was unable to image guided G-tube placement because of transverse colon front of stoma.  General surgery has been consulted.  Plan for G-tube placement on 06/16/2019.    Subjective   Patient seen and examined, wants to go home.  Does not want feeding tube.   Assessment/Plan:     1. Acute bilateral ischemic strokes-left with secondary stroke, right thalamic infarct secondary to large vessel disease.  Patient has right facial droop, right hemiplegia and dysphagia with altered mental status.  Initial CT head was negative, follow-up CT scan showed new periventricular infarct.  MRI brain showed left basal ganglia and right thalamic infarcts.  Brain atrophy.  Repeat MRI on 06/03/2019 showed slight increase in left raising the stroke, evaluation of right chronic infarct.  Patient is currently on aspirin 81 mg daily, Eliquis 5 mg twice daily, which is currently on hold for G-tube placement.  Patient is on therapeutic Lovenox.  Continue Zocor 40 mg daily, hemoglobin A1c is 5.  Therapy recommends  inpatient rehab versus skilled nursing facility.  Patient likely will go to skilled nursing facility after G-tube placement. 2. Dysphagia-secondary to acute stroke.  Repeat swallow evaluation did not show much improvement.  Patient has core track tube feeding for past 2 weeks.  IR was consulted for guided G-tube placement.  Eliquis and aspirin are currently on hold.  Eliquis and aspirin on hold.  Last dose was on 06/10/2019.  General surgery consulted on 06/13/2019.    Plan for G-tube placement on 06/16/2019.  We will stop tube feeding after midnight. 3. UTI-urine culture grew more than 100,000 colonies of Aerococcus.  Treated with amoxicillin.  Repeat urine culture on 06/13/2019 showed no growth. 4. Aspiration pneumonia-patient developed leukocytosis on 06/12/2019, chest x-ray consistent with pneumonia.  Patient started on vancomycin, cefepime.  Urine and blood culture  is negative to date.  5. Goals of care-discussed with patient's wife regarding patient wished not to proceed with feeding tube.  Patient is confused, and patient wife wants to discuss with palliative care.  Will reconsult palliative care for discussion regarding feeding tube.    SpO2: 97 %   COVID-19 Labs  No results for input(s): DDIMER, FERRITIN, LDH, CRP in the last 72 hours.  Lab Results  Component Value Date   SARSCOV2NAA NEGATIVE 05/28/2019   SARSCOV2NAA POSITIVE (A) 02/28/2019   [place] NEGATIVE 12/25/2018   [place] NEGATIVE 12/20/2018     CBG: Recent Labs  Lab 06/14/19 2045 06/14/19 2338 06/15/19 0447 06/15/19 0733 06/15/19 1239  GLUCAP 102* 111* 116* 109* 124*    CBC: Recent Labs  Lab 06/12/19 0445 06/13/19 0456 06/14/19 0329 06/15/19  0502  WBC 21.2* 23.7* 22.5* 9.2  NEUTROABS  --  20.8* 20.0* 7.4  HGB 14.6 14.2 13.6 12.3*  HCT 43.3 42.9 40.7 36.6*  MCV 102.6* 103.9* 102.8* 101.9*  PLT 316 265 209 [id number]    Basic Metabolic Panel: Recent Labs  Lab 06/12/19 0445 06/13/19 0456  NA 136 137  K  4.1 3.9  CL 105 101  CO2 27 26  GLUCOSE 119* 108*  BUN 26* 27*  CREATININE 0.85 0.92  CALCIUM 7.9* 8.2*  MG  --  2.0  PHOS  --  3.3     Liver Function Tests: Recent Labs  Lab 06/13/19 0456  AST 75*  ALT 128*  ALKPHOS 134*  BILITOT 1.1  PROT 5.2*  ALBUMIN 2.2*        DVT prophylaxis: Lovenox  Code Status: DNR  Family Communication: No family at bedside  Disposition Plan:   Status is: Inpatient  Dispo: The patient is from: Home              Anticipated d/c is to: CIR versus skilled nursing facility              Anticipated d/c date is: 06/18/2019              Patient currently admitted with acute bilateral ischemic strokes, dysphagia.  Barrier to discharge-pending placement of G-tube        Scheduled medications:  . chlorhexidine  15 mL Mouth Rinse BID  . doxazosin  1 mg Per Tube Daily  . feeding supplement (PRO-STAT SUGAR FREE 64)  30 mL Per Tube BID  . free water  125 mL Per Tube Q4H  . mouth rinse  15 mL Mouth Rinse q12n4p  . melatonin  3 mg Per Tube QHS  . multivitamin  15 mL Per Tube Daily  . polyethylene glycol  17 g Per Tube Daily  . simvastatin  40 mg Per Tube q1800  . sodium chloride  1 spray Each Nare QID  . sodium chloride flush  10-40 mL Intracatheter Q12H    Consultants:  IR  General surgery  Neurology  Palliative care  Procedures:  None  Antibiotics:   Anti-infectives (From admission, onward)   Start     Dose/Rate Route Frequency Ordered Stop   06/14/19 1200  vancomycin (VANCOCIN) IVPB 1000 mg/200 mL premix     1,000 mg 200 mL/hr over 60 Minutes Intravenous Every 24 hours 06/13/19 1343     06/14/19 0400  ceFEPIme (MAXIPIME) 2 g in sodium chloride 0.9 % 100 mL IVPB     2 g 200 mL/hr over 30 Minutes Intravenous Every 12 hours 06/13/19 1343     06/13/19 1430  vancomycin (VANCOCIN) IVPB 1000 mg/200 mL premix     1,000 mg 200 mL/hr over 60 Minutes Intravenous  Once 06/13/19 1309 06/13/19 1603   06/13/19 1400  ceFEPIme  (MAXIPIME) 2 g in sodium chloride 0.9 % 100 mL IVPB     2 g 200 mL/hr over 30 Minutes Intravenous  Once 06/13/19 1309 06/13/19 1447   06/13/19 1400  metroNIDAZOLE (FLAGYL) IVPB 500 mg     500 mg 100 mL/hr over 60 Minutes Intravenous Every 8 hours 06/13/19 1309     06/13/19 0800  cefTRIAXone (ROCEPHIN) 1 g in sodium chloride 0.9 % 100 mL IVPB  Status:  Discontinued     1 g 200 mL/hr over 30 Minutes Intravenous Every 24 hours 06/13/19 0708 06/13/19 1308   06/07/19 1515  amoxicillin (AMOXIL) 250  MG/5ML suspension 500 mg     500 mg Per Tube Every 8 hours 06/07/19 1514 06/11/19 2131   06/07/19 1400  amoxicillin (AMOXIL) 250 MG/5ML suspension 500 mg  Status:  Discontinued     500 mg Oral Every 8 hours 06/07/19 1036 06/07/19 1514       Objective   Vitals:   06/15/19 0100 06/15/19 0354 06/15/19 0744 06/15/19 1150  BP:  (!) 124/98 129/72 100/67  Pulse:   94 81  Resp:  18 18 18   Temp:  98 F (36.7 C) 98 F (36.7 C) 98 F (36.7 C)  TempSrc:  Axillary Axillary Axillary  SpO2:  97% 95% 97%  Weight: 81.2 kg       Intake/Output Summary (Last 24 hours) at 06/15/2019 1325 Last data filed at 06/15/2019 1317 Gross per 24 hour  Intake 1667.31 ml  Output 925 ml  Net 742.31 ml    05/07 1901 - 05/09 0700 In: [id number] [P.O.:150] Out: [id number] [Urine:1075]  Filed Weights   06/05/19 0349 06/15/19 0100  Weight: 68.3 kg 81.2 kg    Physical Examination:    General-appears in no acute distress  Heart-S1-S2, regular, no murmur auscultated  Lungs-clear to auscultation bilaterally, no wheezing or crackles auscultated  Abdomen-soft, nontender, no organomegaly  Extremities-no edema in the lower extremities  Neuro-alert, oriented to place and person only    Data Reviewed:   Recent Results (from the past 240 hour(s))  Culture, Urine     Status: Abnormal   Collection Time: 06/12/19  2:38 PM   Specimen: Urine, Clean Catch  Result Value Ref Range Status   Specimen Description URINE, CLEAN  CATCH  Final   Special Requests   Final    NONE Performed at [facility], [place] [address], [place], [place] [address]    Culture MULTIPLE SPECIES PRESENT, SUGGEST RECOLLECTION (A)  Final   Report Status 06/13/2019 FINAL  Final  Culture, blood (routine x 2)     Status: None (Preliminary result)   Collection Time: 06/13/19 12:25 PM   Specimen: BLOOD  Result Value Ref Range Status   Specimen Description BLOOD RIGHT ANTECUBITAL  Final   Special Requests   Final    BOTTLES DRAWN AEROBIC AND ANAEROBIC Blood Culture adequate volume   Culture   Final    NO GROWTH 2 DAYS Performed at [facility], [place] [address] [place], [place] [address]    Report Status PENDING  Incomplete  Culture, blood (routine x 2)     Status: None (Preliminary result)   Collection Time: 06/13/19 12:30 PM   Specimen: BLOOD  Result Value Ref Range Status   Specimen Description BLOOD LEFT ANTECUBITAL  Final   Special Requests   Final    BOTTLES DRAWN AEROBIC AND ANAEROBIC Blood Culture adequate volume   Culture   Final    NO GROWTH 2 DAYS Performed at [facility], [place] [address] [place],  [address]    Report Status PENDING  Incomplete  Culture, Urine     Status: None   Collection Time: 06/13/19  1:35 PM   Specimen: Urine, Clean Catch  Result Value Ref Range Status   Specimen Description URINE, CLEAN CATCH  Final   Special Requests NONE  Final   Culture   Final    NO GROWTH Performed at [facility], [place] [address] [place],  [address]    Report Status 06/14/2019 FINAL  Final     Studies:  DG CHEST PORT 1 VIEW  Result Date: 06/13/2019 CLINICAL DATA:  Pneumonia EXAM: PORTABLE CHEST 1 VIEW COMPARISON:  CT 06/11/2019, chest radiograph 06/05/2019, 12/24/2018 FINDINGS: Slight increased ground-glass opacity at the peripheral left lung base. Low lung volumes. Enlarged cardiomediastinal silhouette. Stable left-sided pacing device. Esophageal tube tip overlies the distal  stomach. Mild atelectasis at the right base. IMPRESSION: New small focal opacity at the peripheral left lung base may reflect a small focus of pneumonia. Cardiomegaly without edema Electronically Signed   By: Donavan Foil M.D.   On: 06/13/2019 16:20       [place]   [facility] If 7PM-7AM, please contact night-coverage at [email or web address], Office  [phone]   06/15/2019, 1:25 PM  LOS: 18 days

## 2019-06-15 NOTE — Progress Notes (Signed)
Palliative:  HPI: 86 y.o. male  with past medical history of HTN, hyperlipidemia, DVT on Eliquis, CAD, and right carotid stenosis s/p endarderectomy admitted on 05/28/2019 with acute ischemic stroke. Mental status worsened on 4/26 and CT showed new low density on the left concerning for acute infarction. Palliative care has been consulted to discuss [employer] in the setting of extension of the CVA and clinical findings of right hemiplegia, dysarthria, and dysphagia. Ongoing conversations regarding artificial feeding.   I met today initially at William Mann's bedside with wife. William Mann does arouse and tries to speak but he is very difficult to understand. Wife requested to speak with her son via speaker phone outside the room. We discussed that William Mann has been expressing that he does not want surgery for feeding tube placement. Wife says that he has said multiple times today that he does not want surgery. She does not want to do anything that he does not want nor does his son. However his son does want to have a conversation with his father to make sure that he understands this decision and hear this for himself.   We did spend some time discussing expectations with feeding tube and high risk of aspiration and poor intake leading to failure to thrive and dehydration and that he would be eligible for hospice facility. We also discussed that with feeding tube this will not reverse damage from stroke and his overall declined functional status could lead to recurrent infections, skin breakdown, and further complications. We discussed that ultimately if William Mann is unable to make this decision we should base the decision on what an acceptable quality of life would be for him and family know him best. We also discussed that even if they decide to pursue feeding tube and if he continued to decline this may be opted to stop just like we would stop a breathing machine if it is no longer helping to get him better as  they are both forms of life support. (Wife was struggling with the idea that he would be starved). I also encouraged them to read Hard Choices booklet.   Update: I returned to bedside and spoke with son and wife. They have had time to talk and would like to continue to have feeding tube placed tomorrow. They believe that William Mann was saying that he does not want to have surgery specifically tomorrow. Either way William Mann was contradictory in his responses to me and family believe he would desire feeding tube.   All questions/concerns addressed. Emotional support provided. Discussed with Dr. Darrick Meigs.   Exam: Alert, orientation difficult to determine with dysarthria. Cortrak feeding tube in place. Breathing regular, unlabored. Abd soft.   Plan: - Ongoing conversation if patient/family desire to pursue long term feeding tube placement (scheduled for tomorrow). Family hope to have a decision today or at least by tomorrow morning.   1300-1400, [id number] 70 min  Vinie Sill, NP Palliative Medicine Team Pager [phone] (Please see [email or web address] for schedule) Team Phone [phone]    Greater than 50%  of this time was spent counseling and coordinating care related to the above assessment and plan

## 2019-06-16 ENCOUNTER — Inpatient Hospital Stay (HOSPITAL_COMMUNITY): Payer: Medicare Other

## 2019-06-16 LAB — CBC WITH DIFFERENTIAL/PLATELET
Abs Immature Granulocytes: 0.03 10*3/uL (ref 0.00–0.07)
Basophils Absolute: 0.1 10*3/uL (ref 0.0–0.1)
Basophils Relative: 1 %
Eosinophils Absolute: 0.1 10*3/uL (ref 0.0–0.5)
Eosinophils Relative: 1 %
HCT: 40.9 % (ref 39.0–52.0)
Hemoglobin: 13.7 g/dL (ref 13.0–17.0)
Immature Granulocytes: 0 %
Lymphocytes Relative: 10 %
Lymphs Abs: 0.8 10*3/uL (ref 0.7–4.0)
MCH: 34.3 pg — ABNORMAL HIGH (ref 26.0–34.0)
MCHC: 33.5 g/dL (ref 30.0–36.0)
MCV: 102.5 fL — ABNORMAL HIGH (ref 80.0–100.0)
Monocytes Absolute: 0.8 10*3/uL (ref 0.1–1.0)
Monocytes Relative: 9 %
Neutro Abs: 6.7 10*3/uL (ref 1.7–7.7)
Neutrophils Relative %: 79 %
Platelets: 159 10*3/uL (ref 150–400)
RBC: 3.99 MIL/uL — ABNORMAL LOW (ref 4.22–5.81)
RDW: 13.2 % (ref 11.5–15.5)
WBC: 8.5 10*3/uL (ref 4.0–10.5)
nRBC: 0 % (ref 0.0–0.2)

## 2019-06-16 LAB — GLUCOSE, CAPILLARY
Glucose-Capillary: 101 mg/dL — ABNORMAL HIGH (ref 70–99)
Glucose-Capillary: 102 mg/dL — ABNORMAL HIGH (ref 70–99)
Glucose-Capillary: 119 mg/dL — ABNORMAL HIGH (ref 70–99)
Glucose-Capillary: 79 mg/dL (ref 70–99)
Glucose-Capillary: 87 mg/dL (ref 70–99)
Glucose-Capillary: 88 mg/dL (ref 70–99)

## 2019-06-16 MED ORDER — OSMOLITE 1.2 CAL PO LIQD
1000.0000 mL | ORAL | Status: DC
  Administered 2019-06-16 – 2019-06-18 (×3): 1000 mL
  Filled 2019-06-16 (×4): qty 1000

## 2019-06-16 NOTE — Progress Notes (Signed)
Speech Language Pathology Treatment: Dysphagia  Patient Details Name: William Mann MRN: [id number] DOB: February 06, 1930 Today's Date: 06/16/2019 Time: 4098-1191 SLP Time Calculation (min) (ACUTE ONLY): 15 min  Assessment / Plan / Recommendation Clinical Impression  F/u after today's MBS to review results and begin expanded POs with pt.  Pt's wife present; William Mann in recliner; sleepy, but willing to eat a little.  He consumed teaspoon boluses of vanilla pudding and then yogurt, neither of which he liked today.  He required verbal cues to seal his lips, which he was able to do, then swallow with effort.  Swallow was palpable.  There was no coughing over several consecutive swallows.  He continued to require cues to clear the residue from his oral cavity post-swallow and before consuming more.  Recommend allowing purees (applesauce, yogurt, pudding) and sips of honey thick liquid from floor stock.  Pt may also have ice chips 30 minutes after eating other POs and after oral care.  SLP will continue to follow.    HPI HPI: William Mann is a 86 y.o. male with medical history significant of thyroid nodule, emphysema, GERD, right carotid endarterectomy,PVD, hypertension, hyperlipidemia, lower extremity DVT, bilateral hearing loss, presented with new onset of slurred speech and facial droop. Per chart wife also noticed significant right leg weakness, right-sided facial droop, and cough/choking after eating drink since last night. MRI shows small acute infarcts in the left basal ganglia/corona radiata and right thalamus. Pt with increasing lethargy prompting repeat MRI on 4/27 - this revealed extension of the left basal ganglia/radiating white matter tract infarction which is larger than on the initial presentation, measuring up to 2 cm in size presently.       SLP Plan  Continue with current plan of care       Recommendations  Supervision: Staff to assist with self  feeding Compensations: Minimize environmental distractions;Effortful swallow                Oral Care Recommendations: Oral care QID SLP Visit Diagnosis: Dysphagia, oropharyngeal phase (R13.12) Plan: Continue with current plan of care       GO               Ryane Konieczny L. Samson Frederic, MA CCC/SLP Acute Rehabilitation Services Office number [phone] Pager [phone]  Carolan Shiver 06/16/2019, 3:44 PM

## 2019-06-16 NOTE — Progress Notes (Signed)
PROGRESS NOTE  William Mann [id number] DOB: [birth year] DOA: 05/28/2019 PCP: Patient, No Pcp Per  HPI/Recap of past 31 hours: 86 year old male with history of muscular disease, hypertension, hyperlipidemia, DVT on Eliquis, permanent pacemaker, carotid stenosis status post right carotid endarterectomy, COVID-19 infection in January 2021 came to ED with slurred speech and facial droop as well as recurrent falls.  Initially came to ED ER on 05/27/2019 with a fall at that time he had negative neuro exam and was discharged home.  Subsequently developed slurred speech, unsteady gait, right leg weakness, right facial droop when coughing and choking after eating so he was brought back to the ER.  MRI brain showed small acute infarct in left basal ganglia/corona radiata and right thalamus.  Patient remained in the hospital with fluctuating mental status.  Patient was started on core track feeding tube.  IR was unable to image guided G-tube placement because of transverse colon front of stoma.  General surgery has been consulted.  Plan for G-tube placement on 06/16/2019.  06/16/19: Seen and examined with son and spouse at bedside.  Agreeable to proceed with PEG tube placement.  Off tube feeding.  Cooperative and in no acute distress.    Assessment/Plan: Active Problems:   Stroke (cerebrum) (HCC)   CVA (cerebral vascular accident) ([place])   Coronary artery disease involving native coronary artery of native heart without angina pectoris   Deep venous thrombosis (HCC)   Fatty liver   Dementia without behavioral disturbance (HCC)   Tachycardia   Hypertension   Leukocytosis   Dysphagia due to recent cerebral infarction   Hemiplegia as late effect of cerebral infarction [facility])   Advanced care planning/counseling discussion   Goals of care, counseling/discussion   Apraxia due to acute stroke [facility])   Palliative care by specialist   Protein-calorie malnutrition, severe   DNR (do not resuscitate)  Palliative care encounter   1. Acute bilateral ischemic strokes-left with secondary stroke, right thalamic infarct secondary to large vessel disease.  Patient has right facial droop, right hemiplegia and dysphagia with altered mental status.  Initial CT head was negative, follow-up CT scan showed new periventricular infarct.  MRI brain showed left basal ganglia and right thalamic infarcts.  Brain atrophy.  Repeat MRI on 06/03/2019 showed slight increase in left raising the stroke, evaluation of right chronic infarct.  Patient is currently on aspirin 81 mg daily, Eliquis 5 mg twice daily, which is currently on hold for G-tube placement.  Patient is on therapeutic Lovenox.  Continue Zocor 40 mg daily, hemoglobin A1c is 5.  Therapy recommends inpatient rehab versus skilled nursing facility.  Patient likely will go to skilled nursing facility after G-tube placement. 2. Dysphagia-secondary to acute stroke.  Repeat swallow evaluation did not show much improvement.  Patient has core track tube feeding for past 2 weeks.  IR was consulted for guided G-tube placement.  Eliquis and aspirin are currently on hold.  Last dose was on 06/10/2019.  General surgery consulted on 06/13/2019.    Plan for G-tube placement on 06/16/2019.    Off tube feedings.  Family agreeable to PEG tube placement.   3. Aerococcus UTI-urine culture grew more than 100,000 colonies of Aerococcus.  Treated with amoxicillin.  Repeat urine culture on 06/13/2019 showed no growth. 4. Aspiration pneumonia-patient developed leukocytosis on 06/12/2019, chest x-ray consistent with pneumonia.  Patient started on vancomycin, cefepime.    Also on IV Flagyl.  Urine culture negative final.  Blood cultures negative to date.  Afebrile with  no leukocytosis.  Will obtain a procalcitonin level in the morning.  Goals of care-palliative care team following.  DNR.     DVT prophylaxis: SCDs; Eliquis on hold due to planned procedure today.  Code Status: DNR  Family  Communication: No family at bedside    Status is: Inpatient  Dispo: The patient is from: Home.               Anticipated d/c is to: SNF.              Anticipated d/c date is: 06/17/19              Patient currently awaiting PEG tube placement by general surgery.          Objective: Vitals:   06/15/19 2050 06/15/19 2359 06/16/19 0452 06/16/19 0735  BP: 138/83 128/82 119/66 105/60  Pulse: 87 92 71 84  Resp: 18 17 18 18   Temp: 97.9 F (36.6 C) 97.7 F (36.5 C) 97.8 F (36.6 C) 98.4 F (36.9 C)  TempSrc: Oral Oral Oral Oral  SpO2: 97% 95% 97% 96%  Weight:        Intake/Output Summary (Last 24 hours) at 06/16/2019 0745 Last data filed at 06/15/2019 2342 Gross per 24 hour  Intake 427.31 ml  Output 750 ml  Net -322.69 ml   Filed Weights   06/05/19 0349 06/15/19 0100  Weight: 68.3 kg 81.2 kg    Exam:  . General: 86 y.o. year-old male well developed well nourished in no acute distress.  Alert and cooperative. . Cardiovascular: Regular rate and rhythm with no rubs or gallops.  Marland Kitchen Respiratory: Clear to auscultation with no wheezes or rales.  . Abdomen: Soft nontender nondistended with normal bowel sounds. . Musculoskeletal: No lower extremity edema bilaterally. Marland Kitchen Psychiatry: Mood is appropriate for condition and setting   Data Reviewed: CBC: Recent Labs  Lab 06/12/19 0445 06/13/19 0456 06/14/19 0329 06/15/19 0502 06/16/19 0347  WBC 21.2* 23.7* 22.5* 9.2 8.5  NEUTROABS  --  20.8* 20.0* 7.4 6.7  HGB 14.6 14.2 13.6 12.3* 13.7  HCT 43.3 42.9 40.7 36.6* 40.9  MCV 102.6* 103.9* 102.8* 101.9* 102.5*  PLT 316 265 209 159 [id number]   Basic Metabolic Panel: Recent Labs  Lab 06/12/19 0445 06/13/19 0456  NA 136 137  K 4.1 3.9  CL 105 101  CO2 27 26  GLUCOSE 119* 108*  BUN 26* 27*  CREATININE 0.85 0.92  CALCIUM 7.9* 8.2*  MG  --  2.0  PHOS  --  3.3   GFR: Estimated Creatinine Clearance: 53.7 mL/min (by C-G formula based on SCr of 0.92 mg/dL). Liver Function  Tests: Recent Labs  Lab 06/13/19 0456  AST 75*  ALT 128*  ALKPHOS 134*  BILITOT 1.1  PROT 5.2*  ALBUMIN 2.2*   No results for input(s): LIPASE, AMYLASE in the last 168 hours. No results for input(s): AMMONIA in the last 168 hours. Coagulation Profile: No results for input(s): INR, PROTIME in the last 168 hours. Cardiac Enzymes: No results for input(s): CKTOTAL, CKMB, CKMBINDEX, TROPONINI in the last 168 hours. BNP (last 3 results) No results for input(s): PROBNP in the last 8760 hours. HbA1C: No results for input(s): HGBA1C in the last 72 hours. CBG: Recent Labs  Lab 06/15/19 1707 06/15/19 2205 06/16/19 0025 06/16/19 0456 06/16/19 0731  GLUCAP 106* 116* 102* 87 79   Lipid Profile: No results for input(s): CHOL, HDL, LDLCALC, TRIG, CHOLHDL, LDLDIRECT in the last 72 hours. Thyroid Function Tests: No  results for input(s): TSH, T4TOTAL, FREET4, T3FREE, THYROIDAB in the last 72 hours. Anemia Panel: No results for input(s): VITAMINB12, FOLATE, FERRITIN, TIBC, IRON, RETICCTPCT in the last 72 hours. Urine analysis:    Component Value Date/Time   COLORURINE YELLOW 06/13/2019 [place] 06/13/2019 1333   LABSPEC 1.021 06/13/2019 1333   PHURINE 5.0 06/13/2019 1333   GLUCOSEU NEGATIVE 06/13/2019 1333   HGBUR NEGATIVE 06/13/2019 1333   BILIRUBINUR NEGATIVE 06/13/2019 1333   KETONESUR NEGATIVE 06/13/2019 1333   PROTEINUR NEGATIVE 06/13/2019 1333   UROBILINOGEN 1.0 10/08/2014 0214   NITRITE NEGATIVE 06/13/2019 1333   LEUKOCYTESUR NEGATIVE 06/13/2019 1333   Sepsis Labs: @LABRCNTIP (procalcitonin:4,lacticidven:4)  ) Recent Results (from the past 240 hour(s))  Culture, Urine     Status: Abnormal   Collection Time: 06/12/19  2:38 PM   Specimen: Urine, Clean Catch  Result Value Ref Range Status   Specimen Description URINE, CLEAN CATCH  Final   Special Requests   Final    NONE Performed at [facility], [place] [address] [place], [place] [address]     Culture MULTIPLE SPECIES PRESENT, SUGGEST RECOLLECTION (A)  Final   Report Status 06/13/2019 FINAL  Final  Culture, blood (routine x 2)     Status: None (Preliminary result)   Collection Time: 06/13/19 12:25 PM   Specimen: BLOOD  Result Value Ref Range Status   Specimen Description BLOOD RIGHT ANTECUBITAL  Final   Special Requests   Final    BOTTLES DRAWN AEROBIC AND ANAEROBIC Blood Culture adequate volume   Culture   Final    NO GROWTH 3 DAYS Performed at [facility], [place] [address], [place], [place] [address]    Report Status PENDING  Incomplete  Culture, blood (routine x 2)     Status: None (Preliminary result)   Collection Time: 06/13/19 12:30 PM   Specimen: BLOOD  Result Value Ref Range Status   Specimen Description BLOOD LEFT ANTECUBITAL  Final   Special Requests   Final    BOTTLES DRAWN AEROBIC AND ANAEROBIC Blood Culture adequate volume   Culture   Final    NO GROWTH 3 DAYS Performed at [facility], [place] [address] [place], [place] [address]    Report Status PENDING  Incomplete  Culture, Urine     Status: None   Collection Time: 06/13/19  1:35 PM   Specimen: Urine, Clean Catch  Result Value Ref Range Status   Specimen Description URINE, CLEAN CATCH  Final   Special Requests NONE  Final   Culture   Final    NO GROWTH Performed at [facility], [place] [address], [place],  [address]    Report Status 06/14/2019 FINAL  Final      Studies: No results found.  Scheduled Meds: . chlorhexidine  15 mL Mouth Rinse BID  . doxazosin  1 mg Per Tube Daily  . free water  125 mL Per Tube Q4H  . mouth rinse  15 mL Mouth Rinse q12n4p  . melatonin  3 mg Per Tube QHS  . multivitamin  15 mL Per Tube Daily  . polyethylene glycol  17 g Per Tube Daily  . simvastatin  40 mg Per Tube q1800  . sodium chloride  1 spray Each Nare QID  . sodium chloride flush  10-40 mL Intracatheter Q12H    Continuous Infusions: . ceFEPime (MAXIPIME) IV 2 g (06/16/19 0448)    . metronidazole 500 mg (06/16/19 [id number])  .  vancomycin 200 mL/hr at 06/15/19 1317     LOS: 19 days     Kayleen Memos, MD [facility] Pager [phone]  If 7PM-7AM, please contact night-coverage [email or web address] Password [facility] 06/16/2019, 7:45 AM

## 2019-06-16 NOTE — Progress Notes (Signed)
Physical Therapy Treatment Patient Details Name: William Mann MRN: [id number] DOB: 11/10/1929 Today's Date: 06/16/2019    History of Present Illness [name] is a 86 y.o. male with history of  R ICA stenosis s/p R CEA, PVD, hypertension, hyperlipidemia, lower extremity DVT on Eliquis, carotid artery occlusion, atherosclerosis and bilateral hearing loss presenting with dysarthria, facial droop, R leg weakness.  Found to have L BG/CR and R PLIC/thalamic infarcts. Repeat CT 4/26 and MRI 4/27 revealed extension of the left basal ganglia/radiating white matter tract infarction.    PT Comments    Patient alert and agreeable to participate in therapy upon arrival. Pt follows single step cues consistently. Pt requires +2 assist for functional transfer training due to R side weakness. Continue to recommend CIR for further skilled PT services to maximize independence and safety with mobility.    Follow Up Recommendations  CIR;Supervision/Assistance - 24 hour     Equipment Recommendations  None recommended by PT(defer to next venue)    Recommendations for Other Services Rehab consult     Precautions / Restrictions Precautions Precautions: Fall Restrictions Weight Bearing Restrictions: No    Mobility  Bed Mobility Overal bed mobility: Needs Assistance Bed Mobility: Supine to Sit     Supine to sit: Mod assist;HOB elevated     General bed mobility comments: assist to bring bilat to EOB (pt able to bring them most of the way unassisted) and to elevate trunk into sitting   Transfers Overall transfer level: Needs assistance   Transfers: Sit to/from Stand;Stand Pivot Transfers Sit to Stand: +2 physical assistance;Mod assist Stand pivot transfers: +2 physical assistance;Mod assist       General transfer comment: R knee blocked for all transfers; assist to power up into standing and for weight shifting/balance; difficulty with R LE mobility  Ambulation/Gait                  Stairs             Wheelchair Mobility    Modified Rankin (Stroke Patients Only) Modified Rankin (Stroke Patients Only) Pre-Morbid Rankin Score: Moderate disability Modified Rankin: Moderately severe disability     Balance Overall balance assessment: Needs assistance Sitting-balance support: Feet supported;Single extremity supported Sitting balance-Leahy Scale: Poor Sitting balance - Comments: min guard for safety; pt able to maintain sitting balance EOB without physical assist   Standing balance support: Bilateral upper extremity supported Standing balance-Leahy Scale: Poor                              Cognition Arousal/Alertness: Awake/alert Behavior During Therapy: Flat affect Overall Cognitive Status: Impaired/Different from baseline Area of Impairment: Following commands;Problem solving;Safety/judgement;Attention;Orientation                 Orientation Level: Time Current Attention Level: Sustained   Following Commands: Follows one step commands with increased time Safety/Judgement: Decreased awareness of deficits;Decreased awareness of safety   Problem Solving: Slow processing;Requires verbal cues        Exercises      General Comments        Pertinent Vitals/Pain Pain Assessment: No/denies pain    Home Living                      Prior Function            PT Goals (current goals can now be found in the care plan section) Progress  towards PT goals: Progressing toward goals    Frequency    Min 4X/week      PT Plan Current plan remains appropriate    Co-evaluation              AM-PAC PT "6 Clicks" Mobility   Outcome Measure  Help needed turning from your back to your side while in a flat bed without using bedrails?: A Lot Help needed moving from lying on your back to sitting on the side of a flat bed without using bedrails?: A Lot Help needed moving to and from a bed to a chair  (including a wheelchair)?: A Lot Help needed standing up from a chair using your arms (e.g., wheelchair or bedside chair)?: A Lot Help needed to walk in hospital room?: A Lot Help needed climbing 3-5 steps with a railing? : Total 6 Click Score: 11    End of Session Equipment Utilized During Treatment: Gait belt Activity Tolerance: Patient tolerated treatment well Patient left: in chair;with call bell/phone within reach;with chair alarm set;with family/visitor present Nurse Communication: Mobility status PT Visit Diagnosis: Other abnormalities of gait and mobility (R26.89);Muscle weakness (generalized) (M62.81);Other symptoms and signs involving the nervous system (R29.898)     Time: 1329-1350 PT Time Calculation (min) (ACUTE ONLY): 21 min  Charges:  $Gait Training: 8-22 mins                     Earney Navy, PTA Acute Rehabilitation Services Pager: [phone] Office: [phone]     Darliss Cheney 06/16/2019, 3:49 PM

## 2019-06-16 NOTE — Progress Notes (Signed)
Modified Barium Swallow Progress Note  Patient Details  Name: William Mann MRN: [id number] Date of Birth: Oct 18, 1929  Today's Date: 06/16/2019  Modified Barium Swallow completed.  Full report located under Chart Review in the Imaging Section.  Brief recommendations include the following:  Clinical Impression  Pt continues to present with a moderate-severe oropharyngeal dysphagia, with marginal improvements noted in all phases of the swallow.  Oral phase is marked by impaired but improving control/cohesion with right lateral sulci residual, anterior spill (pt demonstrates improved lip seal when cued to not let material escape) and delayed transfer. Propulsion into pharynx is also marginally improved, with improved base-of-tongue to pharyngeal wall contact. Today there were occasions of improved inversion of epiglottis over larynx.  Incomplete laryngeal vestibule closure and delays in closure continue to lead to penetration of all tested consistencies (thin, nectar, honey, and puree)- notable during today's study was pt's response to penetration, which was to swallow again (this action was not observed on prior studies). There remains ongoing, trace aspiration of thin and nectar consistencies, evoking a cough response.  Nectar, honey, and pureed barium continue to pool in valleculae, but are now passing with greater ease through the UES.  There is minimal residue in pyriforms (much improved).  Discussed study with pt's son, William Mann.  William Mann is unlikely to be able to meet his needs via POs alone given degree of impairment. He is, however, showing improved sensory-motor function and it is reasonable for him to begin eating a greater variety of consistencies - in small quantities- both therapeutically with SLP and with full supervision of staff/family.  He may continue to aspirate in trace amounts.  William Mann verbalizes moving forward with PEG with PO supplementation for pleasure and therapeutic  purposes.  Recommend allowing small quantities of food/drink (this may amount to only 5-6 boluses of material); start with purees from floor stock (pudding, applesauce, yogurt) and small sips of honey-thick liquid.  Pt may still have ice chips AFTER ORAL CARE and 30 MIN AFTER PO INTAKE.  William Mann verbalizes agreement with plan.    Swallow Evaluation Recommendations       SLP Diet Recommendations: from floor stock: applesauce, pudding, yogurt; Honey thick liquids   Liquid Administration via: Cup   Medication Administration: Via alternative means   Supervision: Full assist for feeding   Compensations: Slow rate;Effortful swallow(cues to seal lips)       Oral Care Recommendations: Oral care QID   Other Recommendations: Order thickener from [place]. William Mann, [place] Office number [phone] Pager [phone]  William Mann 06/16/2019,3:39 PM

## 2019-06-16 NOTE — Progress Notes (Signed)
Patient ID: William Mann, male   DOB: Jul 14, 1929, 86 y.o.   MRN: [id number]       Subjective: Patient is disoriented.  Son thinks he is better and would like speech re-eval prior to g-tube placement, but pending he fails this, he and the patient's wife want to proceed with g-tube placement.  ROS: See above, otherwise other systems negative  Objective: Vital signs in last 24 hours: Temp:  [97.7 F (36.5 C)-98.4 F (36.9 C)] 98.4 F (36.9 C) (05/10 0735) Pulse Rate:  [71-92] 84 (05/10 0735) Resp:  [17-18] 18 (05/10 0735) BP: (100-138)/(60-95) 105/60 (05/10 0735) SpO2:  [95 %-97 %] 96 % (05/10 0735) Last BM Date: 06/15/19  Intake/Output from previous day: 05/09 0701 - 05/10 0700 In: 477.3 [NG/GT:50; IV Piggyback:427.3] Out: 1200 [Urine:1200] Intake/Output this shift: No intake/output data recorded.  PE: Abd: soft, NT, Nd, +BS Psych: not oriented but to self  Lab Results:  Recent Labs    06/15/19 0502 06/16/19 0347  WBC 9.2 8.5  HGB 12.3* 13.7  HCT 36.6* 40.9  PLT 159 159   BMET No results for input(s): NA, K, CL, CO2, GLUCOSE, BUN, CREATININE, CALCIUM in the last 72 hours. PT/INR No results for input(s): LABPROT, INR in the last 72 hours. CMP     Component Value Date/Time   NA 137 06/13/2019 0456   K 3.9 06/13/2019 0456   CL 101 06/13/2019 0456   CO2 26 06/13/2019 0456   GLUCOSE 108 (H) 06/13/2019 0456   BUN 27 (H) 06/13/2019 0456   CREATININE 0.92 06/13/2019 0456   CALCIUM 8.2 (L) 06/13/2019 0456   PROT 5.2 (L) 06/13/2019 0456   ALBUMIN 2.2 (L) 06/13/2019 0456   AST 75 (H) 06/13/2019 0456   ALT 128 (H) 06/13/2019 0456   ALKPHOS 134 (H) 06/13/2019 0456   BILITOT 1.1 06/13/2019 0456   GFRNONAA >60 06/13/2019 0456   GFRAA >60 06/13/2019 0456   Lipase  No results found for: LIPASE     Studies/Results: No results found.  Anti-infectives: Anti-infectives (From admission, onward)   Start     Dose/Rate Route Frequency Ordered Stop   06/14/19  1200  vancomycin (VANCOCIN) IVPB 1000 mg/200 mL premix     1,000 mg 200 mL/hr over 60 Minutes Intravenous Every 24 hours 06/13/19 1343     06/14/19 0400  ceFEPIme (MAXIPIME) 2 g in sodium chloride 0.9 % 100 mL IVPB     2 g 200 mL/hr over 30 Minutes Intravenous Every 12 hours 06/13/19 1343     06/13/19 1430  vancomycin (VANCOCIN) IVPB 1000 mg/200 mL premix     1,000 mg 200 mL/hr over 60 Minutes Intravenous  Once 06/13/19 1309 06/13/19 1603   06/13/19 1400  ceFEPIme (MAXIPIME) 2 g in sodium chloride 0.9 % 100 mL IVPB     2 g 200 mL/hr over 30 Minutes Intravenous  Once 06/13/19 1309 06/13/19 1447   06/13/19 1400  metroNIDAZOLE (FLAGYL) IVPB 500 mg     500 mg 100 mL/hr over 60 Minutes Intravenous Every 8 hours 06/13/19 1309     06/13/19 0800  cefTRIAXone (ROCEPHIN) 1 g in sodium chloride 0.9 % 100 mL IVPB  Status:  Discontinued     1 g 200 mL/hr over 30 Minutes Intravenous Every 24 hours 06/13/19 0708 06/13/19 1308   06/07/19 1515  amoxicillin (AMOXIL) 250 MG/5ML suspension 500 mg     500 mg Per Tube Every 8 hours 06/07/19 1514 06/11/19 2131   06/07/19 1400  amoxicillin (  AMOXIL) 250 MG/5ML suspension 500 mg  Status:  Discontinued     500 mg Oral Every 8 hours 06/07/19 1036 06/07/19 1514       Assessment/Plan - UTI - Acute bilateral ischemic stroke: Left basal ganglia stroke, right thalamic infarct secondary to large vessel disease  Dysphagia  -Patient not amenable to IR G-tube placement or PEG placement due to anatomy -Will plan for laparoscopic versus open G-tube placement on Tuesday as time allows and pending how he does with speech reassessment today.    -may resume TFs today and NPO at MN  FEN - NPO, TF's. Hold TF's after midnight the morning prior to surgery VTE - SCDs, Lovenox (hold therapeutic Lovenox 24 hours prior to surgery) ID - Maxipime per TRH   LOS: 19 days    Henreitta Cea , [facility] 06/16/2019, 10:00 AM Please see Amion for pager number  during day hours 7:00am-4:30pm or 7:00am -11:30am on weekends

## 2019-06-16 NOTE — Progress Notes (Signed)
Pharmacy Antibiotic Note  William Mann is a 86 y.o. male admitted on 05/28/2019 with sepsis.  Pharmacy was consulted on 5/7 for vanc/cefepime dosing.  Pt presented on 4/21 for slurred speech and weakness. He was found to have a CVA. His intake has remained very poor and has been on TF. He is awaiting for a G-tube.   He was previously treated with amoxicillin for his aerococcus UTI. His wbc trended up to 24k on 06/13/19. , thus Vanc/cefepime/flagyl were started for sepsis.   Currently he is afebrile, WBC decreased to within normal limits.  Scr last checked on 06/13/19 was 0.92,  CrCl ~50 ml/min.  UOP 1200 ml (0.6 ml/kg/hr) on 5/9. We will check Scr 5/11 AM , on day # 4 Vancomycin.   Goal vanc trough 10-15  Plan: Vanc 1g IV q24 hours Cefepime 2g IV q12 hours VancomycinTrough as needed   Weight: 81.2 kg (179 lb)  Temp (24hrs), Avg:97.9 F (36.6 C), Min:97.6 F (36.4 C), Max:98.4 F (36.9 C)  Recent Labs  Lab 06/12/19 0445 06/13/19 0456 06/13/19 1332 06/13/19 1633 06/14/19 0329 06/15/19 0502 06/16/19 0347  WBC 21.2* 23.7*  --   --  22.5* 9.2 8.5  CREATININE 0.85 0.92  --   --   --   --   --   LATICACIDVEN  --   --  1.4 1.1  --   --   --     Estimated Creatinine Clearance: 53.7 mL/min (by C-G formula based on SCr of 0.92 mg/dL).    Allergies  Allergen Reactions  . Atorvastatin Other (See Comments)    Liver inflammation    . Rosuvastatin Other (See Comments)    Muscle pain  . Viagra [Sildenafil] Other (See Comments)    Reaction ??    Antimicrobials this admission: 5/1 amox>>5/5 5/7 vanc>> 5/7 cefepime>> 5/7 flagyl>>  Dose adjustments this admission:   Microbiology results: 4/26 blood>>negative  4/27 urine>>aerococcus urinae 5/6 urine>>negative  5/7 blood>> no growth to date x 3 days    Nicole Cella, RPh Clinical Pharmacist Please check AMION for all [place] phone numbers After 10:00 PM, call [place] [phone] 06/16/2019 11:28 AM

## 2019-06-17 LAB — CBC WITH DIFFERENTIAL/PLATELET
Abs Immature Granulocytes: 0.05 10*3/uL (ref 0.00–0.07)
Basophils Absolute: 0.1 10*3/uL (ref 0.0–0.1)
Basophils Relative: 1 %
Eosinophils Absolute: 0.1 10*3/uL (ref 0.0–0.5)
Eosinophils Relative: 1 %
HCT: 40.7 % (ref 39.0–52.0)
Hemoglobin: 13.5 g/dL (ref 13.0–17.0)
Immature Granulocytes: 1 %
Lymphocytes Relative: 9 %
Lymphs Abs: 0.8 10*3/uL (ref 0.7–4.0)
MCH: 34 pg (ref 26.0–34.0)
MCHC: 33.2 g/dL (ref 30.0–36.0)
MCV: 102.5 fL — ABNORMAL HIGH (ref 80.0–100.0)
Monocytes Absolute: 0.8 10*3/uL (ref 0.1–1.0)
Monocytes Relative: 9 %
Neutro Abs: 7.1 10*3/uL (ref 1.7–7.7)
Neutrophils Relative %: 79 %
Platelets: 160 10*3/uL (ref 150–400)
RBC: 3.97 MIL/uL — ABNORMAL LOW (ref 4.22–5.81)
RDW: 13.2 % (ref 11.5–15.5)
WBC: 9 10*3/uL (ref 4.0–10.5)
nRBC: 0 % (ref 0.0–0.2)

## 2019-06-17 LAB — BASIC METABOLIC PANEL
Anion gap: 7 (ref 5–15)
BUN: 14 mg/dL (ref 8–23)
CO2: 27 mmol/L (ref 22–32)
Calcium: 8 mg/dL — ABNORMAL LOW (ref 8.9–10.3)
Chloride: 105 mmol/L (ref 98–111)
Creatinine, Ser: 0.76 mg/dL (ref 0.61–1.24)
GFR calc Af Amer: >60 mL/min (ref >60)
GFR calc non Af Amer: >60 mL/min (ref >60)
Glucose, Bld: 96 mg/dL (ref 70–99)
Potassium: 4 mmol/L (ref 3.5–5.1)
Sodium: 139 mmol/L (ref 135–145)

## 2019-06-17 LAB — GLUCOSE, CAPILLARY
Glucose-Capillary: 91 mg/dL (ref 70–99)
Glucose-Capillary: 95 mg/dL (ref 70–99)
Glucose-Capillary: 86 mg/dL (ref 70–99)
Glucose-Capillary: 115 mg/dL — ABNORMAL HIGH (ref 70–99)
Glucose-Capillary: 114 mg/dL — ABNORMAL HIGH (ref 70–99)
Glucose-Capillary: 130 mg/dL — ABNORMAL HIGH (ref 70–99)

## 2019-06-17 LAB — MAGNESIUM: Magnesium: 2.1 mg/dL (ref 1.7–2.4)

## 2019-06-17 LAB — PHOSPHORUS: Phosphorus: 3 mg/dL (ref 2.5–4.6)

## 2019-06-17 LAB — PROCALCITONIN: Procalcitonin: <0.1 ng/mL

## 2019-06-17 MED ORDER — APIXABAN 5 MG PO TABS
5.0000 mg | ORAL_TABLET | Freq: Two times a day (BID) | ORAL | Status: DC
  Administered 2019-06-17 – 2019-06-19 (×5): 5 mg
  Filled 2019-06-17 (×6): qty 1

## 2019-06-17 MED ORDER — ASPIRIN EC 81 MG PO TBEC
81.0000 mg | DELAYED_RELEASE_TABLET | ORAL | Status: DC
  Administered 2019-06-19 – 2019-06-23 (×2): 81 mg via ORAL
  Filled 2019-06-17 (×2): qty 1

## 2019-06-17 MED ORDER — APIXABAN 5 MG PO TABS: 5.0000 mg | ORAL_TABLET | Freq: Two times a day (BID) | ORAL | Status: DC

## 2019-06-17 NOTE — Progress Notes (Signed)
Occupational Therapy Treatment Patient Details Name: William Mann MRN: [id number] DOB: 1929/07/18 Today's Date: 06/17/2019    History of present illness Mr. Damar Charvet is a 86 y.o. male with history of  R ICA stenosis s/p R CEA, PVD, hypertension, hyperlipidemia, lower extremity DVT on Eliquis, carotid artery occlusion, atherosclerosis and bilateral hearing loss presenting with dysarthria, facial droop, R leg weakness.  Found to have L BG/CR and R PLIC/thalamic infarcts. Repeat CT 4/26 and MRI 4/27 revealed extension of the left basal ganglia/radiating white matter tract infarction.   OT comments  Patient continues to make minimal progress towards goals in skilled OT session. Patient's session encompassed co-treat with PT in hopes to increase participation and promote increased activity tolerance and endurance. Pt continues to require increased time in order to complete tasks, and tires quickly (10-20 seconds) with basic ADLs. Despite being more awake and alert, pt could not tolerate sitting EOB for longer than 2 minutes in session, demonstrating a L lateral lean (dynamic challenge to balance not attempted). Discharge to a SNF continues to remain appropriate; will continue to follow acutely.    Follow Up Recommendations  SNF;Supervision/Assistance - 24 hour;CIR    Equipment Recommendations  3 in 1 bedside commode    Recommendations for Other Services      Precautions / Restrictions Precautions Precautions: Fall Restrictions Weight Bearing Restrictions: No       Mobility Bed Mobility Overal bed mobility: Needs Assistance Bed Mobility: Supine to Sit     Supine to sit: Mod assist;HOB elevated     General bed mobility comments: assist to bring bilat to EOB (pt able to bring them most of the way unassisted) and to elevate trunk into sitting   Transfers Overall transfer level: Needs assistance     Sit to Stand: +2 physical assistance;Mod assist Stand pivot  transfers: +2 physical assistance;Mod assist       General transfer comment: Attempted sit<>stand x1 and completed incremental scoot transfer to strong side (L) into recliner to date    Balance Overall balance assessment: Needs assistance Sitting-balance support: Feet supported;Single extremity supported Sitting balance-Leahy Scale: Poor Sitting balance - Comments: Pt with increased difficulty to maintain sitting balance after 2 minutes siiting EOB unable to complete any sort of dynamic challenge (trunk rotation) in sitting Postural control: Left lateral lean Standing balance support: Bilateral upper extremity supported Standing balance-Leahy Scale: Poor Standing balance comment: external assist to maintain standing                           ADL either performed or assessed with clinical judgement   ADL Overall ADL's : Needs assistance/impaired     Grooming: Wash/dry face;Sitting;Minimal assistance;Brushing hair;Moderate assistance Grooming Details (indicate cue type and reason): multi-modal cues for thoroughness, unable to reach back of head to brush hair             Lower Body Dressing: Total assistance;Bed level Lower Body Dressing Details (indicate cue type and reason): to don new socks Toilet Transfer: Moderate assistance;Maximal assistance;+2 for safety/equipment;+2 for physical assistance Toilet Transfer Details (indicate cue type and reason): incremental scoot to recliner         Functional mobility during ADLs: Moderate assistance;Maximal assistance;+2 for physical assistance;+2 for safety/equipment General ADL Comments: Pt with increased ability to follow commands in session, however remains lethargic and fatigues quickly with minimal activity     Vision       Perception  Praxis      Cognition Arousal/Alertness: Awake/alert Behavior During Therapy: Flat affect Overall Cognitive Status: Impaired/Different from baseline Area of Impairment:  Following commands;Problem solving;Safety/judgement;Attention;Orientation                 Orientation Level: Disoriented to;Time Current Attention Level: Sustained   Following Commands: Follows one step commands with increased time Safety/Judgement: Decreased awareness of deficits;Decreased awareness of safety Awareness: Intellectual Problem Solving: Slow processing;Requires verbal cues General Comments: Able to follow commands, however would not answer with much more than a grunt or a smile in session to date        Exercises     Shoulder Instructions       General Comments      Pertinent Vitals/ Pain       Pain Assessment: No/denies pain Faces Pain Scale: No hurt  Home Living                                          Prior Functioning/Environment              Frequency  Min 2X/week        Progress Toward Goals  OT Goals(current goals can now be found in the care plan section)  Progress towards OT goals: Progressing toward goals  Acute Rehab OT Goals Patient Stated Goal: to get a PEG tube placed so he can go to rehab OT Goal Formulation: With patient/family Time For Goal Achievement: 06/17/19 Potential to Achieve Goals: Fair  Plan Discharge plan remains appropriate    Co-evaluation    PT/OT/SLP Co-Evaluation/Treatment: Yes Reason for Co-Treatment: Complexity of the patient's impairments (multi-system involvement);Necessary to address cognition/behavior during functional activity;For patient/therapist safety;To address functional/ADL transfers   OT goals addressed during session: ADL's and self-care;Strengthening/ROM      AM-PAC OT "6 Clicks" Daily Activity     Outcome Measure   Help from another person eating meals?: A Lot Help from another person taking care of personal grooming?: A Lot Help from another person toileting, which includes using toliet, bedpan, or urinal?: Total Help from another person bathing (including  washing, rinsing, drying)?: A Lot Help from another person to put on and taking off regular upper body clothing?: A Lot Help from another person to put on and taking off regular lower body clothing?: Total 6 Click Score: 10    End of Session Equipment Utilized During Treatment: Gait belt  OT Visit Diagnosis: Unsteadiness on feet (R26.81);Muscle weakness (generalized) (M62.81);Other symptoms and signs involving cognitive function   Activity Tolerance Patient limited by lethargy;Patient limited by fatigue   Patient Left in chair;with call bell/phone within reach;with family/visitor present   Nurse Communication Mobility status        Time: 7829-5621 OT Time Calculation (min): 35 min  Charges: OT General Charges $OT Visit: 1 Visit OT Treatments $Self Care/Home Management : 8-22 mins  Pollyann Glen E. Elray Dains, COTA/L Acute Rehabilitation Services [phone]   Cherlyn Cushing 06/17/2019, 12:55 PM

## 2019-06-17 NOTE — Progress Notes (Signed)
Physical Therapy Treatment Patient Details Name: William Mann MRN: [id number] DOB: 1929/10/11 Today's Date: 06/17/2019    History of Present Illness William Mann is a 86 y.o. male with history of  R ICA stenosis s/p R CEA, PVD, hypertension, hyperlipidemia, lower extremity DVT on Eliquis, carotid artery occlusion, atherosclerosis and bilateral hearing loss presenting with dysarthria, facial droop, R leg weakness.  Found to have L BG/CR and R PLIC/thalamic infarcts. Repeat CT 4/26 and MRI 4/27 revealed extension of the left basal ganglia/radiating white matter tract infarction.    PT Comments    Patient seen for mobility progression. Pt alert, participatory, and able to transfer OOB with +2 assist. Pt with R lateral bias in sitting and requires assist to maintain sitting balance EOB. Noted pt denied CIR. Recommend SNF for further skilled PT services to maximize independence and safety with mobility.     Follow Up Recommendations  Supervision/Assistance - 24 hour;SNF     Equipment Recommendations  Other (comment)(defer to next venue)    Recommendations for Other Services       Precautions / Restrictions Precautions Precautions: Fall Restrictions Weight Bearing Restrictions: No    Mobility  Bed Mobility Overal bed mobility: Needs Assistance Bed Mobility: Supine to Sit     Supine to sit: Mod assist;HOB elevated     General bed mobility comments: assist to bring bilat to EOB (pt able to bring them most of the way unassisted) and to elevate trunk into sitting   Transfers Overall transfer level: Needs assistance Equipment used: None Transfers: Sit to/from Stand;Lateral/Scoot Transfers Sit to Stand: Max assist Stand pivot transfers: +2 physical assistance;Mod assist      Lateral/Scoot Transfers: Mod assist;+2 physical assistance General transfer comment: pt became fatigued sitting EOB and with increased trunk flexion and R lateral bias so lateral scoot  transfer to L side for safety; pt with wet chuck pad in chair and pt able to partial stand with max A (face to face using gait belt and knees blocked) to change pad  Ambulation/Gait                 Stairs             Wheelchair Mobility    Modified Rankin (Stroke Patients Only) Modified Rankin (Stroke Patients Only) Pre-Morbid Rankin Score: Moderate disability Modified Rankin: Moderately severe disability     Balance Overall balance assessment: Needs assistance Sitting-balance support: Feet supported;Single extremity supported Sitting balance-Leahy Scale: Poor Sitting balance - Comments: Pt with increased difficulty to maintain sitting balance after 2 minutes siiting EOB unable to complete any sort of dynamic challenge (trunk rotation) in sitting Postural control: Right lateral lean Standing balance support: Bilateral upper extremity supported Standing balance-Leahy Scale: Poor Standing balance comment: external assist to maintain standing                            Cognition Arousal/Alertness: Awake/alert Behavior During Therapy: Flat affect Overall Cognitive Status: Impaired/Different from baseline Area of Impairment: Following commands;Problem solving;Safety/judgement;Attention;Orientation                 Orientation Level: Disoriented to;Time Current Attention Level: Sustained   Following Commands: Follows one step commands with increased time Safety/Judgement: Decreased awareness of deficits;Decreased awareness of safety Awareness: Intellectual Problem Solving: Slow processing;Requires verbal cues General Comments: Able to follow commands, however would not answer with much more than a grunt or a smile in session to date  Exercises      General Comments        Pertinent Vitals/Pain Pain Assessment: No/denies pain Faces Pain Scale: No hurt    Home Living                      Prior Function            PT Goals  (current goals can now be found in the care plan section) Acute Rehab PT Goals Patient Stated Goal: to get a PEG tube placed so he can go to rehab Progress towards PT goals: Progressing toward goals    Frequency    Min 3X/week      PT Plan Discharge plan needs to be updated;Frequency needs to be updated    Co-evaluation PT/OT/SLP Co-Evaluation/Treatment: Yes Reason for Co-Treatment: Complexity of the patient's impairments (multi-system involvement);Necessary to address cognition/behavior during functional activity;For patient/therapist safety;To address functional/ADL transfers PT goals addressed during session: Mobility/safety with mobility OT goals addressed during session: ADL's and self-care;Strengthening/ROM      AM-PAC PT "6 Clicks" Mobility   Outcome Measure  Help needed turning from your back to your side while in a flat bed without using bedrails?: A Lot Help needed moving from lying on your back to sitting on the side of a flat bed without using bedrails?: A Lot Help needed moving to and from a bed to a chair (including a wheelchair)?: A Lot Help needed standing up from a chair using your arms (e.g., wheelchair or bedside chair)?: A Lot Help needed to walk in hospital room?: Total Help needed climbing 3-5 steps with a railing? : Total 6 Click Score: 10    End of Session Equipment Utilized During Treatment: Gait belt Activity Tolerance: Patient tolerated treatment well Patient left: in chair;with call bell/phone within reach;with chair alarm set;with family/visitor present Nurse Communication: Mobility status;Other (comment)(Stedy standing fram for transfer back to bed) PT Visit Diagnosis: Other abnormalities of gait and mobility (R26.89);Muscle weakness (generalized) (M62.81);Other symptoms and signs involving the nervous system (R29.898)     Time: [id number] PT Time Calculation (min) (ACUTE ONLY): 35 min  Charges:  $Therapeutic Activity: 8-22 mins                      Earney Navy, PTA Acute Rehabilitation Services Pager: [phone] Office: [phone]     Darliss Cheney 06/17/2019, 3:50 PM

## 2019-06-17 NOTE — Progress Notes (Addendum)
No plans to move forward with g-tube at this time.  He was able to pass for some food on swallow evaluation.  Dr. Kieth Brightly discussed with family yesterday afternoon.  May resume TFs at this time and continue diet advancement per speech recommendations.  No further surgical needs.  We will sign off.  Henreitta Cea 8:59 AM 06/17/2019

## 2019-06-17 NOTE — Progress Notes (Signed)
Patient's son expressed concerns about possible g-tube placement surgery. Nurse notified Dr. Maxwell Caul at [facility]. Dr. Maxwell Caul advised nurse that since patient did better with swallow study yesterday that patient will not be having surgery today.

## 2019-06-17 NOTE — Plan of Care (Signed)
  Problem: Education: Goal: Knowledge of disease or condition will improve Outcome: Progressing   Problem: Education: Goal: Knowledge of General Education information will improve Description: Including pain rating scale, medication(s)/side effects and non-pharmacologic comfort measures Outcome: Progressing   Problem: Safety: Goal: Ability to remain free from injury will improve Outcome: Progressing

## 2019-06-17 NOTE — Progress Notes (Signed)
Patient complaining of chest pain. Assessed patient and VS obtained. MD made aware. MD ordered 12-lead EKG. EKG complete. MD made aware.

## 2019-06-17 NOTE — Progress Notes (Signed)
TIBC, IRON, RETICCTPCT in the last 72 hours. Urine analysis:    Component Value Date/Time   COLORURINE YELLOW 06/13/2019 [place] 06/13/2019 1333   LABSPEC 1.021 06/13/2019 1333   PHURINE 5.0 06/13/2019 1333   GLUCOSEU NEGATIVE 06/13/2019 1333   HGBUR NEGATIVE 06/13/2019 1333   BILIRUBINUR NEGATIVE 06/13/2019 1333   KETONESUR NEGATIVE 06/13/2019 1333   PROTEINUR NEGATIVE 06/13/2019 1333   UROBILINOGEN 1.0 10/08/2014 0214   NITRITE NEGATIVE 06/13/2019 1333   LEUKOCYTESUR NEGATIVE 06/13/2019 1333   Sepsis Labs: @LABRCNTIP (procalcitonin:4,lacticidven:4)  ) Recent Results (from the past 240 hour(s))  Culture, Urine     Status: Abnormal   Collection Time: 06/12/19  2:38 PM   Specimen: Urine, Clean Catch  Result Value Ref Range Status   Specimen Description URINE, CLEAN CATCH  Final   Special Requests   Final    NONE Performed at [facility], [place] [address], [place], [place] [address]    Culture MULTIPLE SPECIES PRESENT, SUGGEST RECOLLECTION (A)  Final   Report Status 06/13/2019 FINAL  Final  Culture, blood (routine x 2)      Status: None (Preliminary result)   Collection Time: 06/13/19 12:25 PM   Specimen: BLOOD  Result Value Ref Range Status   Specimen Description BLOOD RIGHT ANTECUBITAL  Final   Special Requests   Final    BOTTLES DRAWN AEROBIC AND ANAEROBIC Blood Culture adequate volume   Culture   Final    NO GROWTH 4 DAYS Performed at [facility], [place] [address] [place],  [address]    Report Status PENDING  Incomplete  Culture, blood (routine x 2)     Status: None (Preliminary result)   Collection Time: 06/13/19 12:30 PM   Specimen: BLOOD  Result Value Ref Range Status   Specimen Description BLOOD LEFT ANTECUBITAL  Final   Special Requests   Final    BOTTLES DRAWN AEROBIC AND ANAEROBIC Blood Culture adequate volume   Culture   Final    NO GROWTH 4 DAYS Performed at [facility], [place] [address], [place], [place] [address]    Report Status PENDING  Incomplete  Culture, Urine     Status: None   Collection Time: 06/13/19  1:35 PM   Specimen: Urine, Clean Catch  Result Value Ref Range Status   Specimen Description URINE, CLEAN CATCH  Final   Special Requests NONE  Final   Culture   Final    NO GROWTH Performed at [facility], [place] [address] [place], [place] [address]    Report Status 06/14/2019 FINAL  Final      Studies: DG Swallowing Func-Speech Pathology  Result Date: 06/16/2019 Objective Swallowing Evaluation: Type of Study: MBS-Modified Barium Swallow Study  Patient Details Name: William Mann MRN: [id number] Date of Birth: 25-Aug-1929 Today's Date: 06/16/2019 Time: SLP Start Time (ACUTE ONLY): 1240 -SLP Stop Time (ACUTE ONLY): 1315 SLP Time Calculation (min) (ACUTE ONLY): 35 min Past Medical History: Past Medical History: Diagnosis Date . Aortic atherosclerosis ([place])  . Bilateral hearing loss  . BPH (benign prostatic hyperplasia)  . Cancer ([place])   basal cell carcinoma . Carotid artery occlusion  . Constipation  . Coronary artery disease  . COVID-19  . DVT (deep  venous thrombosis) ([place])  . Emphysema lung ([place])  . Fatty liver  . GERD (gastroesophageal reflux disease)  . History of echocardiogram   Echo 4/17: Vigorous LVEF, EF 65-70%, normal wall motion, grade 1 diastolic dysfunction,  TIBC, IRON, RETICCTPCT in the last 72 hours. Urine analysis:    Component Value Date/Time   COLORURINE YELLOW 06/13/2019 [place] 06/13/2019 1333   LABSPEC 1.021 06/13/2019 1333   PHURINE 5.0 06/13/2019 1333   GLUCOSEU NEGATIVE 06/13/2019 1333   HGBUR NEGATIVE 06/13/2019 1333   BILIRUBINUR NEGATIVE 06/13/2019 1333   KETONESUR NEGATIVE 06/13/2019 1333   PROTEINUR NEGATIVE 06/13/2019 1333   UROBILINOGEN 1.0 10/08/2014 0214   NITRITE NEGATIVE 06/13/2019 1333   LEUKOCYTESUR NEGATIVE 06/13/2019 1333   Sepsis Labs: @LABRCNTIP (procalcitonin:4,lacticidven:4)  ) Recent Results (from the past 240 hour(s))  Culture, Urine     Status: Abnormal   Collection Time: 06/12/19  2:38 PM   Specimen: Urine, Clean Catch  Result Value Ref Range Status   Specimen Description URINE, CLEAN CATCH  Final   Special Requests   Final    NONE Performed at [facility], [place] [address], [place], [place] [address]    Culture MULTIPLE SPECIES PRESENT, SUGGEST RECOLLECTION (A)  Final   Report Status 06/13/2019 FINAL  Final  Culture, blood (routine x 2)      Status: None (Preliminary result)   Collection Time: 06/13/19 12:25 PM   Specimen: BLOOD  Result Value Ref Range Status   Specimen Description BLOOD RIGHT ANTECUBITAL  Final   Special Requests   Final    BOTTLES DRAWN AEROBIC AND ANAEROBIC Blood Culture adequate volume   Culture   Final    NO GROWTH 4 DAYS Performed at [facility], [place] [address] [place],  [address]    Report Status PENDING  Incomplete  Culture, blood (routine x 2)     Status: None (Preliminary result)   Collection Time: 06/13/19 12:30 PM   Specimen: BLOOD  Result Value Ref Range Status   Specimen Description BLOOD LEFT ANTECUBITAL  Final   Special Requests   Final    BOTTLES DRAWN AEROBIC AND ANAEROBIC Blood Culture adequate volume   Culture   Final    NO GROWTH 4 DAYS Performed at [facility], [place] [address], [place], [place] [address]    Report Status PENDING  Incomplete  Culture, Urine     Status: None   Collection Time: 06/13/19  1:35 PM   Specimen: Urine, Clean Catch  Result Value Ref Range Status   Specimen Description URINE, CLEAN CATCH  Final   Special Requests NONE  Final   Culture   Final    NO GROWTH Performed at [facility], [place] [address] [place], [place] [address]    Report Status 06/14/2019 FINAL  Final      Studies: DG Swallowing Func-Speech Pathology  Result Date: 06/16/2019 Objective Swallowing Evaluation: Type of Study: MBS-Modified Barium Swallow Study  Patient Details Name: William Mann MRN: [id number] Date of Birth: 25-Aug-1929 Today's Date: 06/16/2019 Time: SLP Start Time (ACUTE ONLY): 1240 -SLP Stop Time (ACUTE ONLY): 1315 SLP Time Calculation (min) (ACUTE ONLY): 35 min Past Medical History: Past Medical History: Diagnosis Date . Aortic atherosclerosis ([place])  . Bilateral hearing loss  . BPH (benign prostatic hyperplasia)  . Cancer ([place])   basal cell carcinoma . Carotid artery occlusion  . Constipation  . Coronary artery disease  . COVID-19  . DVT (deep  venous thrombosis) ([place])  . Emphysema lung ([place])  . Fatty liver  . GERD (gastroesophageal reflux disease)  . History of echocardiogram   Echo 4/17: Vigorous LVEF, EF 65-70%, normal wall motion, grade 1 diastolic dysfunction,  PROGRESS NOTE  William Mann [id number] DOB: November 12, 1929 DOA: 05/28/2019 PCP: Patient, No Pcp Per  HPI/Recap of past 3 hours: 86 year old male with history of muscular disease, hypertension, hyperlipidemia, DVT on Eliquis, permanent pacemaker, carotid stenosis status post right carotid endarterectomy, COVID-19 infection in January 2021 came to ED with slurred speech and facial droop as well as recurrent falls.  Initially came to ED ER on 05/27/2019 with a fall at that time he had negative neuro exam and was discharged home.  Subsequently developed slurred speech, unsteady gait, right leg weakness, right facial droop when coughing and choking after eating so he was brought back to the ER.  MRI brain showed small acute infarct in left basal ganglia/corona radiata and right thalamus.  Patient remained in the hospital with fluctuating mental status.  Patient was started on core track feeding tube.  IR was unable to image guided G-tube placement because of transverse colon front of stoma.  General surgery has been consulted.  Plan for G-tube placement on 06/16/2019.  06/17/19: Seen and examined.  No acute events overnight.  He was able to pass a swallow evaluation yesterday with recommendation for honey thick liquid diet.  This morning he is alert and cooperative.  No further plans for surgical intervention/G-tube placement.  Okay to resume tube feedings.   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 Acute bilateral ischemic strokes-left with secondary stroke, right thalamic infarct secondary to large vessel disease. Presented with right facial droop, right hemiplegia and dysphagia with altered mental status.   L BG/CR and R PLIC/thalamic infarcts secondary to small vessel disease   -CT head No acute abnormality. Small vessel disease. Atrophy.  -MRI  Small L basal ganglia / corona radiata and R thalamic infarcts. Small vessel disease. Atrophy.  -Repeated MRI 4/27: Extended BG stroke. -CTA head & neck no LVO. L ICA plaque w/ 50% stenosis. R CEA patent. R>L supraclinoid ICA and B PCA moderate to severe stenoses.  -LE Doppler  R gastrocnemius veing indeterminate thrombosis. R femoral w/ chronic DVT. -2D Echo EF 55%, LA mild dilation -LDL 160 -HgbA1c 5.0 -Patient is currently on aspirin 81 mg daily, Eliquis 5 mg twice daily, Zocor 40 mg daily  2. Dysphagia-secondary to acute stroke.   Repeat swallow evaluation showed some improvement with speech recs for honey thick liquids. No further plans for surgical placement of G-tube Continue to advance diet as tolerated and as recommended by speech  3. Aerococcus UTI-urine culture grew more than 100,000 colonies of Aerococcus.  Treated with amoxicillin.  Repeat urine culture on 06/13/2019 showed no growth.  Aspiration pneumonia-patient developed leukocytosis on 06/12/2019, chest x-ray consistent with pneumonia.  Patient started on vancomycin, cefepime.    Also on IV Flagyl.  Urine culture negative final.  Blood cultures negative to date.  Afebrile with no leukocytosis.  Procalcitonin<0.10. Dc  TIBC, IRON, RETICCTPCT in the last 72 hours. Urine analysis:    Component Value Date/Time   COLORURINE YELLOW 06/13/2019 [place] 06/13/2019 1333   LABSPEC 1.021 06/13/2019 1333   PHURINE 5.0 06/13/2019 1333   GLUCOSEU NEGATIVE 06/13/2019 1333   HGBUR NEGATIVE 06/13/2019 1333   BILIRUBINUR NEGATIVE 06/13/2019 1333   KETONESUR NEGATIVE 06/13/2019 1333   PROTEINUR NEGATIVE 06/13/2019 1333   UROBILINOGEN 1.0 10/08/2014 0214   NITRITE NEGATIVE 06/13/2019 1333   LEUKOCYTESUR NEGATIVE 06/13/2019 1333   Sepsis Labs: @LABRCNTIP (procalcitonin:4,lacticidven:4)  ) Recent Results (from the past 240 hour(s))  Culture, Urine     Status: Abnormal   Collection Time: 06/12/19  2:38 PM   Specimen: Urine, Clean Catch  Result Value Ref Range Status   Specimen Description URINE, CLEAN CATCH  Final   Special Requests   Final    NONE Performed at [facility], [place] [address], [place], [place] [address]    Culture MULTIPLE SPECIES PRESENT, SUGGEST RECOLLECTION (A)  Final   Report Status 06/13/2019 FINAL  Final  Culture, blood (routine x 2)      Status: None (Preliminary result)   Collection Time: 06/13/19 12:25 PM   Specimen: BLOOD  Result Value Ref Range Status   Specimen Description BLOOD RIGHT ANTECUBITAL  Final   Special Requests   Final    BOTTLES DRAWN AEROBIC AND ANAEROBIC Blood Culture adequate volume   Culture   Final    NO GROWTH 4 DAYS Performed at [facility], [place] [address] [place],  [address]    Report Status PENDING  Incomplete  Culture, blood (routine x 2)     Status: None (Preliminary result)   Collection Time: 06/13/19 12:30 PM   Specimen: BLOOD  Result Value Ref Range Status   Specimen Description BLOOD LEFT ANTECUBITAL  Final   Special Requests   Final    BOTTLES DRAWN AEROBIC AND ANAEROBIC Blood Culture adequate volume   Culture   Final    NO GROWTH 4 DAYS Performed at [facility], [place] [address], [place], [place] [address]    Report Status PENDING  Incomplete  Culture, Urine     Status: None   Collection Time: 06/13/19  1:35 PM   Specimen: Urine, Clean Catch  Result Value Ref Range Status   Specimen Description URINE, CLEAN CATCH  Final   Special Requests NONE  Final   Culture   Final    NO GROWTH Performed at [facility], [place] [address] [place], [place] [address]    Report Status 06/14/2019 FINAL  Final      Studies: DG Swallowing Func-Speech Pathology  Result Date: 06/16/2019 Objective Swallowing Evaluation: Type of Study: MBS-Modified Barium Swallow Study  Patient Details Name: William Mann MRN: [id number] Date of Birth: 25-Aug-1929 Today's Date: 06/16/2019 Time: SLP Start Time (ACUTE ONLY): 1240 -SLP Stop Time (ACUTE ONLY): 1315 SLP Time Calculation (min) (ACUTE ONLY): 35 min Past Medical History: Past Medical History: Diagnosis Date . Aortic atherosclerosis ([place])  . Bilateral hearing loss  . BPH (benign prostatic hyperplasia)  . Cancer ([place])   basal cell carcinoma . Carotid artery occlusion  . Constipation  . Coronary artery disease  . COVID-19  . DVT (deep  venous thrombosis) ([place])  . Emphysema lung ([place])  . Fatty liver  . GERD (gastroesophageal reflux disease)  . History of echocardiogram   Echo 4/17: Vigorous LVEF, EF 65-70%, normal wall motion, grade 1 diastolic dysfunction,  PROGRESS NOTE  William Mann [id number] DOB: November 12, 1929 DOA: 05/28/2019 PCP: Patient, No Pcp Per  HPI/Recap of past 3 hours: 86 year old male with history of muscular disease, hypertension, hyperlipidemia, DVT on Eliquis, permanent pacemaker, carotid stenosis status post right carotid endarterectomy, COVID-19 infection in January 2021 came to ED with slurred speech and facial droop as well as recurrent falls.  Initially came to ED ER on 05/27/2019 with a fall at that time he had negative neuro exam and was discharged home.  Subsequently developed slurred speech, unsteady gait, right leg weakness, right facial droop when coughing and choking after eating so he was brought back to the ER.  MRI brain showed small acute infarct in left basal ganglia/corona radiata and right thalamus.  Patient remained in the hospital with fluctuating mental status.  Patient was started on core track feeding tube.  IR was unable to image guided G-tube placement because of transverse colon front of stoma.  General surgery has been consulted.  Plan for G-tube placement on 06/16/2019.  06/17/19: Seen and examined.  No acute events overnight.  He was able to pass a swallow evaluation yesterday with recommendation for honey thick liquid diet.  This morning he is alert and cooperative.  No further plans for surgical intervention/G-tube placement.  Okay to resume tube feedings.   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 Acute bilateral ischemic strokes-left with secondary stroke, right thalamic infarct secondary to large vessel disease. Presented with right facial droop, right hemiplegia and dysphagia with altered mental status.   L BG/CR and R PLIC/thalamic infarcts secondary to small vessel disease   -CT head No acute abnormality. Small vessel disease. Atrophy.  -MRI  Small L basal ganglia / corona radiata and R thalamic infarcts. Small vessel disease. Atrophy.  -Repeated MRI 4/27: Extended BG stroke. -CTA head & neck no LVO. L ICA plaque w/ 50% stenosis. R CEA patent. R>L supraclinoid ICA and B PCA moderate to severe stenoses.  -LE Doppler  R gastrocnemius veing indeterminate thrombosis. R femoral w/ chronic DVT. -2D Echo EF 55%, LA mild dilation -LDL 160 -HgbA1c 5.0 -Patient is currently on aspirin 81 mg daily, Eliquis 5 mg twice daily, Zocor 40 mg daily  2. Dysphagia-secondary to acute stroke.   Repeat swallow evaluation showed some improvement with speech recs for honey thick liquids. No further plans for surgical placement of G-tube Continue to advance diet as tolerated and as recommended by speech  3. Aerococcus UTI-urine culture grew more than 100,000 colonies of Aerococcus.  Treated with amoxicillin.  Repeat urine culture on 06/13/2019 showed no growth.  Aspiration pneumonia-patient developed leukocytosis on 06/12/2019, chest x-ray consistent with pneumonia.  Patient started on vancomycin, cefepime.    Also on IV Flagyl.  Urine culture negative final.  Blood cultures negative to date.  Afebrile with no leukocytosis.  Procalcitonin<0.10. Dc  PROGRESS NOTE  William Mann [id number] DOB: November 12, 1929 DOA: 05/28/2019 PCP: Patient, No Pcp Per  HPI/Recap of past 3 hours: 86 year old male with history of muscular disease, hypertension, hyperlipidemia, DVT on Eliquis, permanent pacemaker, carotid stenosis status post right carotid endarterectomy, COVID-19 infection in January 2021 came to ED with slurred speech and facial droop as well as recurrent falls.  Initially came to ED ER on 05/27/2019 with a fall at that time he had negative neuro exam and was discharged home.  Subsequently developed slurred speech, unsteady gait, right leg weakness, right facial droop when coughing and choking after eating so he was brought back to the ER.  MRI brain showed small acute infarct in left basal ganglia/corona radiata and right thalamus.  Patient remained in the hospital with fluctuating mental status.  Patient was started on core track feeding tube.  IR was unable to image guided G-tube placement because of transverse colon front of stoma.  General surgery has been consulted.  Plan for G-tube placement on 06/16/2019.  06/17/19: Seen and examined.  No acute events overnight.  He was able to pass a swallow evaluation yesterday with recommendation for honey thick liquid diet.  This morning he is alert and cooperative.  No further plans for surgical intervention/G-tube placement.  Okay to resume tube feedings.   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 Acute bilateral ischemic strokes-left with secondary stroke, right thalamic infarct secondary to large vessel disease. Presented with right facial droop, right hemiplegia and dysphagia with altered mental status.   L BG/CR and R PLIC/thalamic infarcts secondary to small vessel disease   -CT head No acute abnormality. Small vessel disease. Atrophy.  -MRI  Small L basal ganglia / corona radiata and R thalamic infarcts. Small vessel disease. Atrophy.  -Repeated MRI 4/27: Extended BG stroke. -CTA head & neck no LVO. L ICA plaque w/ 50% stenosis. R CEA patent. R>L supraclinoid ICA and B PCA moderate to severe stenoses.  -LE Doppler  R gastrocnemius veing indeterminate thrombosis. R femoral w/ chronic DVT. -2D Echo EF 55%, LA mild dilation -LDL 160 -HgbA1c 5.0 -Patient is currently on aspirin 81 mg daily, Eliquis 5 mg twice daily, Zocor 40 mg daily  2. Dysphagia-secondary to acute stroke.   Repeat swallow evaluation showed some improvement with speech recs for honey thick liquids. No further plans for surgical placement of G-tube Continue to advance diet as tolerated and as recommended by speech  3. Aerococcus UTI-urine culture grew more than 100,000 colonies of Aerococcus.  Treated with amoxicillin.  Repeat urine culture on 06/13/2019 showed no growth.  Aspiration pneumonia-patient developed leukocytosis on 06/12/2019, chest x-ray consistent with pneumonia.  Patient started on vancomycin, cefepime.    Also on IV Flagyl.  Urine culture negative final.  Blood cultures negative to date.  Afebrile with no leukocytosis.  Procalcitonin<0.10. Dc

## 2019-06-17 NOTE — Progress Notes (Signed)
Inpatient Rehabilitation Admissions Coordinator  I met with patient's son and wife at bedside. I discussed our recommendations for SNF level rehab to give patient prolonged recovery that we feel he will need. I have updated Dr. Nevada Crane, [facility] team and acute team. Please call me with any questions. We will sign off at this time.  Danne Baxter, RN, MSN Rehab Admissions Coordinator [phone] 06/17/2019 10:54 AM

## 2019-06-17 NOTE — Progress Notes (Signed)
Patient's Osmolite 1.2 Cal tube feed restarted @ 60 mL/hr via Cortrak. Patient tolerating well. Patient's head elevated above 30 degrees. Patient had intake of 2 teaspoons of pudding and 6 teaspoons of honey-thickened liquids. Mouth care provided. Family at bedside. Call bell within reach. Bed in lowest position. Bed alarm is set.

## 2019-06-18 LAB — CBC WITH DIFFERENTIAL/PLATELET
Abs Immature Granulocytes: 0.07 10*3/uL (ref 0.00–0.07)
Basophils Absolute: 0.1 10*3/uL (ref 0.0–0.1)
Basophils Relative: 1 %
Eosinophils Absolute: 0.1 10*3/uL (ref 0.0–0.5)
Eosinophils Relative: 1 %
HCT: 41.8 % (ref 39.0–52.0)
Hemoglobin: 14.1 g/dL (ref 13.0–17.0)
Immature Granulocytes: 1 %
Lymphocytes Relative: 8 %
Lymphs Abs: 0.9 10*3/uL (ref 0.7–4.0)
MCH: 34.2 pg — ABNORMAL HIGH (ref 26.0–34.0)
MCHC: 33.7 g/dL (ref 30.0–36.0)
MCV: 101.5 fL — ABNORMAL HIGH (ref 80.0–100.0)
Monocytes Absolute: 0.9 10*3/uL (ref 0.1–1.0)
Monocytes Relative: 9 %
Neutro Abs: 8.9 10*3/uL — ABNORMAL HIGH (ref 1.7–7.7)
Neutrophils Relative %: 80 %
Platelets: 166 10*3/uL (ref 150–400)
RBC: 4.12 MIL/uL — ABNORMAL LOW (ref 4.22–5.81)
RDW: 13.2 % (ref 11.5–15.5)
WBC: 10.9 10*3/uL — ABNORMAL HIGH (ref 4.0–10.5)
nRBC: 0 % (ref 0.0–0.2)

## 2019-06-18 LAB — GLUCOSE, CAPILLARY
Glucose-Capillary: 111 mg/dL — ABNORMAL HIGH (ref 70–99)
Glucose-Capillary: 112 mg/dL — ABNORMAL HIGH (ref 70–99)
Glucose-Capillary: 128 mg/dL — ABNORMAL HIGH (ref 70–99)
Glucose-Capillary: 134 mg/dL — ABNORMAL HIGH (ref 70–99)
Glucose-Capillary: 138 mg/dL — ABNORMAL HIGH (ref 70–99)
Glucose-Capillary: 96 mg/dL (ref 70–99)
Glucose-Capillary: 99 mg/dL (ref 70–99)

## 2019-06-18 LAB — CULTURE, BLOOD (ROUTINE X 2)
Culture: NO GROWTH
Culture: NO GROWTH
Special Requests: ADEQUATE
Special Requests: ADEQUATE

## 2019-06-18 MED ORDER — POLYETHYLENE GLYCOL 3350 17 G PO PACK: 17.0000 g | PACK | Freq: Every day | ORAL | Status: DC | PRN

## 2019-06-18 MED ORDER — ADULT MULTIVITAMIN W/MINERALS CH
1.0000 | ORAL_TABLET | Freq: Every day | ORAL | Status: DC
  Administered 2019-06-18 – 2019-06-25 (×8): 1 via ORAL
  Filled 2019-06-18 (×7): qty 1

## 2019-06-18 NOTE — Progress Notes (Signed)
Nutrition Follow-up  DOCUMENTATION CODES:   Severe malnutrition in context of acute illness/injury  INTERVENTION:  Continue MVI daily  Magic cup TID with meals, each supplement provides 290 kcal and 9 grams of protein  NUTRITION DIAGNOSIS:   Severe Malnutrition related to acute illness(CVA) as evidenced by moderate fat depletion, moderate muscle depletion.  Ongoing.  GOAL:   Patient will meet greater than or equal to 90% of their needs  Met with TF.  MONITOR:   Labs, Weight trends, TF tolerance, I & O's  REASON FOR ASSESSMENT:   Consult Enteral/tube feeding initiation and management  ASSESSMENT:   Pt with a PMH significant for PVD, HTN, HLD, DVT, PPM implant, carotid stenosis s/p right carotid endarterectomy, COVID-19 02/2019. Pt admitted with bilateral CVA.  4/22 - failed BSE 4/23 - failed MBS, Cortrak placed(tip in stomach) 4/27 - MRI revealed extension of L BG stroke 4/30 - Cortrak replaced (tip in stomach) 5/03 - failed MBS 5/10 - MBS, recommended honey thick liquids 5/12 - Dysphagia 1 diet with Honey thick liquids ordered; Cortrak removed  SLP saw pt today and recommended that Cortrak be discontinued to "give Mr. Scalera an opportunity to eat." Pt's family is still agreeable to a PEG if needed.   UOP: 1074m x24 hours I/O: +1,[id number] admit  Labs: CBGs [phone] reviewed and include: MVI  Diet Order:   Diet Order            DIET - DYS 1 Room service appropriate? No; Fluid consistency: Honey Thick  Diet effective now              EDUCATION NEEDS:   No education needs have been identified at this time  Skin:  Skin Assessment: Reviewed RN Assessment  Last BM:  5/11  Height:   Ht Readings from Last 1 Encounters:  05/27/19 5' 8"  (1.727 m)    Weight:   Wt Readings from Last 1 Encounters:  06/15/19 81.2 kg   BMI:  Body mass index is 27.22 kg/m.  Estimated Nutritional Needs:   Kcal:  1900-2100  Protein:  95-110  grams  Fluid:  >1.9L/d    AmLarkin InaMS, RD, LDN RD pager number and weekend/on-call pager number located in [facility]

## 2019-06-18 NOTE — Plan of Care (Signed)
  Problem: Clinical Measurements: Goal: Ability to maintain clinical measurements within normal limits will improve Outcome: Progressing   Problem: Clinical Measurements: Goal: Cardiovascular complication will be avoided Outcome: Progressing   

## 2019-06-18 NOTE — Progress Notes (Signed)
PROGRESS NOTE  William Mann [id number] DOB: [birth year] DOA: 05/28/2019 PCP: Patient, No Pcp Per  HPI/Recap of past 3 hours: 86 year old male with history of muscular disease, hypertension, hyperlipidemia, DVT on Eliquis, permanent pacemaker, carotid stenosis status post right carotid endarterectomy, COVID-19 infection in January 2021 came to ED with slurred speech and facial droop as well as recurrent falls.  Initially came to ED ER on 05/27/2019 with a fall at that time he had negative neuro exam and was discharged home.  Subsequently developed slurred speech, unsteady gait, right leg weakness, right facial droop when coughing and choking after eating so he was brought back to the ER.  MRI brain showed small acute infarct in left basal ganglia/corona radiata and right thalamus.  Patient remained in the hospital with fluctuating mental status.  Patient was started on core track feeding tube.  IR was unable to image guided G-tube placement because of transverse colon front of stoma.  General surgery has been consulted.  Plan for G-tube placement on 06/16/2019.  06/18/19:  Seen and examined.  He is alert, cooperative, and interactive.  Plan to dc coretrack and see how much po intake he can tolerate prior to deciding on G tube placement by surgery.  Speech and dietitian assisting.  Son made aware and is agreeable with this plan.     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 Presented with right facial droop, right hemiplegia and dysphagia with altered mental status.   L BG/CR and R PLIC/thalamic infarcts secondary to small vessel disease   -CT head No acute abnormality. Small vessel disease. Atrophy.  -MRI  Small L basal ganglia / corona radiata and R thalamic infarcts. Small vessel disease. Atrophy.  -Repeated MRI 4/27: Extended BG stroke. -CTA head & neck no LVO. L ICA plaque w/ 50% stenosis. R CEA patent. R>L supraclinoid ICA and B PCA moderate to severe stenoses.  -LE Doppler  R gastrocnemius veing indeterminate thrombosis. R femoral w/ chronic DVT. -2D Echo EF 55%, LA mild dilation -LDL 160 -HgbA1c 5.0 -Patient is currently on aspirin 81 mg daily, Eliquis 5 mg twice daily, Zocor 40 mg daily  Dysphagia-secondary to acute stroke.   Repeat swallow evaluation showed some improvement with speech recs for honey thick liquids. Plan to dc coretrack and see how much po intake he can tolerate prior to deciding on G tube placement by surgery.  Speech and dietitian assisting.  Son made aware and is agreeable with this plan. Continue to advance diet as tolerated  Follow speech and dietitian recommendations  Continue aspiration precautions  Treated Aerococcus UTI-urine culture grew more than 100,000 colonies of Aerococcus.  Treated with amoxicillin.  Repeat urine culture on 06/13/2019 showed no growth.  Treated Aspiration pneumonia-patient developed leukocytosis on 06/12/2019,  chest x-ray consistent with pneumonia.  Patient started on vancomycin, cefepime, IV Flagyl.  Blood cultures negative to date.  Afebrile with no leukocytosis.  Procalcitonin<0.10. Dc abx 5/11. Continue aspirations precautions.   Goals of  care-palliative care team following.  DNR.     DVT prophylaxis: SCDs; Eliquis   Code Status: DNR  Family Communication: Updated family at bedside on 5/12    Status is: Inpatient  Dispo: The patient is from: Home.               Anticipated d/c is to: SNF vs Home with HHPT OT.              Anticipated d/c date is: 06/20/19              Patient currently ongoing trial of oral intake         Objective: Vitals:   06/17/19 2355 06/18/19 0438 06/18/19 0846 06/18/19 1109  BP: (!) 152/86 128/88 132/77 105/67  Pulse: (!) 101 99 69 99  Resp: 18 18 20 20   Temp: 97.7 F (36.5 C) 97.6 F (36.4 C) 98.1 F (36.7 C) 98 F (36.7 C)  TempSrc: Oral Axillary Oral Axillary  SpO2: 100% 100% 99% 96%  Weight:        Intake/Output Summary (Last 24 hours) at 06/18/2019 1608 Last data filed at 06/18/2019 0438 Gross per 24 hour  Intake --  Output 850 ml  Net -850 ml   Filed Weights   06/05/19 0349 06/15/19 0100  Weight: 68.3 kg 81.2 kg    Exam:  . General: 86 y.o. year-old male well-developed well-nourished alert and cooperative. . Cardiovascular: Regular rate and rhythm no rubs or gallops.   Marland Kitchen Respiratory: Clear presentation no wheezes or rales.   . Abdomen: Soft nontender normal bowel sounds present.   . Musculoskeletal: No lower extremity edema bilaterally. Marland Kitchen Psychiatry: Mood is appropriate for condition and setting.  Data Reviewed: CBC: Recent Labs  Lab 06/14/19 0329 06/15/19 0502 06/16/19 0347 06/17/19 0601 06/18/19 0236  WBC 22.5* 9.2 8.5 9.0 10.9*  NEUTROABS 20.0* 7.4 6.7 7.1 8.9*  HGB 13.6 12.3* 13.7 13.5 14.1  HCT 40.7 36.6* 40.9 40.7 41.8  MCV 102.8* 101.9* 102.5* 102.5* 101.5*  PLT 209 159 159 160 [id number]   Basic Metabolic Panel: Recent Labs  Lab 06/12/19 0445 06/13/19 0456 06/17/19 0601  NA 136 137 139  K 4.1 3.9 4.0  CL 105 101 105  CO2 27 26 27   GLUCOSE 119* 108* 96  BUN 26* 27* 14  CREATININE 0.85 0.92 0.76  CALCIUM 7.9* 8.2* 8.0*  MG  --   2.0 2.1  PHOS  --  3.3 3.0   GFR: Estimated Creatinine Clearance: 61.8 mL/min (by C-G formula based on SCr of 0.76 mg/dL). Liver Function Tests: Recent Labs  Lab 06/13/19 0456  AST 75*  ALT 128*  ALKPHOS 134*  BILITOT 1.1  PROT 5.2*  ALBUMIN 2.2*   No results for input(s): LIPASE, AMYLASE in the last 168 hours. No results for input(s): AMMONIA in the last 168 hours. Coagulation Profile: No results for input(s): INR, PROTIME in the last 168 hours. Cardiac Enzymes: No results for input(s): CKTOTAL, CKMB, CKMBINDEX, TROPONINI in the last 168 hours. BNP (last 3 results) No results for input(s): PROBNP in the last 8760 hours. HbA1C: No results for input(s): HGBA1C in the last 72 hours. CBG: Recent Labs  Lab 06/17/19 1953 06/17/19 2358 06/18/19 0442 06/18/19 0848 06/18/19 1111  GLUCAP 130* 138* 128* 134* 111*  Lipid Profile: No results for input(s): CHOL, HDL, LDLCALC, TRIG, CHOLHDL, LDLDIRECT in the last 72 hours. Thyroid Function Tests: No results for input(s): TSH, T4TOTAL, FREET4, T3FREE, THYROIDAB in the last 72 hours. Anemia Panel: No results for input(s): VITAMINB12, FOLATE, FERRITIN, TIBC, IRON, RETICCTPCT in the last 72 hours. Urine analysis:    Component Value Date/Time   COLORURINE YELLOW 06/13/2019 [place] 06/13/2019 1333   LABSPEC 1.021 06/13/2019 1333   PHURINE 5.0 06/13/2019 1333   GLUCOSEU NEGATIVE 06/13/2019 1333   HGBUR NEGATIVE 06/13/2019 1333   BILIRUBINUR NEGATIVE 06/13/2019 1333   KETONESUR NEGATIVE 06/13/2019 1333   PROTEINUR NEGATIVE 06/13/2019 1333   UROBILINOGEN 1.0 10/08/2014 0214   NITRITE NEGATIVE 06/13/2019 1333   LEUKOCYTESUR NEGATIVE 06/13/2019 1333   Sepsis Labs: @LABRCNTIP (procalcitonin:4,lacticidven:4)  ) Recent Results (from the past 240 hour(s))  Culture, Urine     Status: Abnormal   Collection Time: 06/12/19  2:38 PM   Specimen: Urine, Clean Catch  Result Value Ref Range Status   Specimen Description  URINE, CLEAN CATCH  Final   Special Requests   Final    NONE Performed at [facility], [place] [address], [place], [place] [address]    Culture MULTIPLE SPECIES PRESENT, SUGGEST RECOLLECTION (A)  Final   Report Status 06/13/2019 FINAL  Final  Culture, blood (routine x 2)     Status: None   Collection Time: 06/13/19 12:25 PM   Specimen: BLOOD  Result Value Ref Range Status   Specimen Description BLOOD RIGHT ANTECUBITAL  Final   Special Requests   Final    BOTTLES DRAWN AEROBIC AND ANAEROBIC Blood Culture adequate volume   Culture   Final    NO GROWTH 5 DAYS Performed at [facility], [place] [address], [place], [place] [address]    Report Status 06/18/2019 FINAL  Final  Culture, blood (routine x 2)     Status: None   Collection Time: 06/13/19 12:30 PM   Specimen: BLOOD  Result Value Ref Range Status   Specimen Description BLOOD LEFT ANTECUBITAL  Final   Special Requests   Final    BOTTLES DRAWN AEROBIC AND ANAEROBIC Blood Culture adequate volume   Culture   Final    NO GROWTH 5 DAYS Performed at [facility], [place] [address], [place], [place] [address]    Report Status 06/18/2019 FINAL  Final  Culture, Urine     Status: None   Collection Time: 06/13/19  1:35 PM   Specimen: Urine, Clean Catch  Result Value Ref Range Status   Specimen Description URINE, CLEAN CATCH  Final   Special Requests NONE  Final   Culture   Final    NO GROWTH Performed at [facility], [place] [address] [place], [place] [address]    Report Status 06/14/2019 FINAL  Final      Studies: No results found.  Scheduled Meds: . apixaban  5 mg Per Tube BID  . [START ON 06/19/2019] aspirin EC  81 mg Oral Once per day on Mon Thu  . chlorhexidine  15 mL Mouth Rinse BID  . doxazosin  1 mg Per Tube Daily  . mouth rinse  15 mL Mouth Rinse q12n4p  . melatonin  3 mg Per Tube QHS  . multivitamin with minerals  1 tablet Oral Daily  . simvastatin  40 mg Per Tube q1800  . sodium chloride  1  spray Each Nare QID  . sodium chloride flush  10-40 mL Intracatheter Q12H    Continuous Infusions:    LOS: 21 days     Kayleen Memos, MD [facility] Pager [phone]  If 7PM-7AM, please contact night-coverage [email or web address] Password [facility] 06/18/2019, 4:08 PM

## 2019-06-18 NOTE — Progress Notes (Signed)
Cortrak removed and patient had no difficulty or discomfort noted. Family at bedside. HOB elevated, bed in lowest position and and call bell within reach.

## 2019-06-18 NOTE — Progress Notes (Signed)
  Speech Language Pathology Treatment: Dysphagia  Patient Details Name: William Mann MRN: [id number] DOB: 1929/04/06 Today's Date: 06/18/2019 Time: [id number] SLP Time Calculation (min) (ACUTE ONLY): 43 min  Assessment / Plan / Recommendation Clinical Impression  PEG not placed yesterday as planned.  Today, pt was alert and communicative.  He was repositioned in bed, mitts removed, hands washed, pt washed his own face.  Oral care provided, pt did some of the work himself with left hand.  Ice chips provided and pt cued to cough and mobilize secretions.  Yogurt and honey-thick liquids offered.  Pt able to hold yogurt container in RUE to stabilize.  He fed himself >half a container of the yogurt and seven teaspoons of honey thick orange juice before he declared that he was full. He required intermittent verbal/visual cues to attend to right side of mouth, to seal lips while masticating, and to swallow with effort. There was no observed coughing with PO intake this am.  Spoke with Dr. [place] Crane re: situation.  Recommend D/Cing cortrak to give Mr. Simon an opportunity to eat and to determine if he can sustain himself via POs alone.  Family is still amenable to a PEG if that is needed.  D/W Mr. And Mrs. Ordaz re: plan.    HPI HPI: Antario Delfino is a 86 y.o. male with medical history significant of thyroid nodule, emphysema, GERD, right carotid endarterectomy,PVD, hypertension, hyperlipidemia, lower extremity DVT, bilateral hearing loss, presented with new onset of slurred speech and facial droop. Per chart wife also noticed significant right leg weakness, right-sided facial droop, and cough/choking after eating drink since last night. MRI shows small acute infarcts in the left basal ganglia/corona radiata and right thalamus. Pt with increasing lethargy prompting repeat MRI on 4/27 - this revealed extension of the left basal ganglia/radiating white matter tract infarction which is larger than on the  initial presentation, measuring up to 2 cm in size presently.       SLP Plan  Continue with current plan of care       Recommendations  Diet recommendations: Dysphagia 1 (puree);Honey-thick liquid Liquids provided via: Cup;Teaspoon Medication Administration: Crushed with puree Supervision: Staff to assist with self feeding Compensations: Minimize environmental distractions;Effortful swallow                Oral Care Recommendations: Oral care BID Follow up Recommendations: Inpatient Rehab SLP Visit Diagnosis: Dysphagia, oropharyngeal phase (R13.12) Plan: Continue with current plan of care       GO                [name] 06/18/2019, 10:00 AM  Estill Bamberg L. Tivis Ringer, [place] Office number [phone] Pager [phone]

## 2019-06-19 LAB — CBC WITH DIFFERENTIAL/PLATELET
Abs Immature Granulocytes: 0.07 10*3/uL (ref 0.00–0.07)
Basophils Absolute: 0.1 10*3/uL (ref 0.0–0.1)
Basophils Relative: 1 %
Eosinophils Absolute: 0.1 10*3/uL (ref 0.0–0.5)
Eosinophils Relative: 2 %
HCT: 41.8 % (ref 39.0–52.0)
Hemoglobin: 14 g/dL (ref 13.0–17.0)
Immature Granulocytes: 1 %
Lymphocytes Relative: 15 %
Lymphs Abs: 1.2 10*3/uL (ref 0.7–4.0)
MCH: 34.7 pg — ABNORMAL HIGH (ref 26.0–34.0)
MCHC: 33.5 g/dL (ref 30.0–36.0)
MCV: 103.7 fL — ABNORMAL HIGH (ref 80.0–100.0)
Monocytes Absolute: 0.7 10*3/uL (ref 0.1–1.0)
Monocytes Relative: 9 %
Neutro Abs: 5.9 10*3/uL (ref 1.7–7.7)
Neutrophils Relative %: 72 %
Platelets: 183 10*3/uL (ref 150–400)
RBC: 4.03 MIL/uL — ABNORMAL LOW (ref 4.22–5.81)
RDW: 13.8 % (ref 11.5–15.5)
WBC: 8.1 10*3/uL (ref 4.0–10.5)
nRBC: 0 % (ref 0.0–0.2)

## 2019-06-19 LAB — GLUCOSE, CAPILLARY
Glucose-Capillary: 102 mg/dL — ABNORMAL HIGH (ref 70–99)
Glucose-Capillary: 213 mg/dL — ABNORMAL HIGH (ref 70–99)
Glucose-Capillary: 84 mg/dL (ref 70–99)
Glucose-Capillary: 91 mg/dL (ref 70–99)
Glucose-Capillary: 91 mg/dL (ref 70–99)
Glucose-Capillary: 96 mg/dL (ref 70–99)

## 2019-06-19 MED ORDER — DOXAZOSIN MESYLATE 1 MG PO TABS
1.0000 mg | ORAL_TABLET | Freq: Every day | ORAL | Status: DC
  Administered 2019-06-20 – 2019-06-25 (×6): 1 mg via ORAL
  Filled 2019-06-19 (×6): qty 1

## 2019-06-19 MED ORDER — LACTATED RINGERS IV SOLN: INTRAVENOUS | Status: AC

## 2019-06-19 MED ORDER — SIMVASTATIN 20 MG PO TABS
40.0000 mg | ORAL_TABLET | Freq: Every day | ORAL | Status: DC
  Administered 2019-06-20 – 2019-06-25 (×6): 40 mg via ORAL
  Filled 2019-06-19 (×6): qty 2

## 2019-06-19 MED ORDER — MELATONIN 3 MG PO TABS
3.0000 mg | ORAL_TABLET | Freq: Every day | ORAL | Status: DC
  Administered 2019-06-19 – 2019-06-24 (×6): 3 mg via ORAL
  Filled 2019-06-19 (×6): qty 1

## 2019-06-19 NOTE — Progress Notes (Signed)
Physical Therapy Treatment Patient Details Name: William Mann MRN: [id number] DOB: 12-26-1929 Today's Date: 06/19/2019    History of Present Illness Mr. Taniela Kolesnik is a 86 y.o. male with history of  R ICA stenosis s/p R CEA, PVD, hypertension, hyperlipidemia, lower extremity DVT on Eliquis, carotid artery occlusion, atherosclerosis and bilateral hearing loss presenting with dysarthria, facial droop, R leg weakness.  Found to have L BG/CR and R PLIC/thalamic infarcts. Repeat CT 4/26 and MRI 4/27 revealed extension of the left basal ganglia/radiating white matter tract infarction.    PT Comments    Patient seen for mobility progression. Pt is awake/alert and participatory during session. Pt stood X 2 trials with mod A +2 using Stedy standing frame. Continue to progress as tolerated with anticipated d/c to SNF for further skilled PT services.     Follow Up Recommendations  Supervision/Assistance - 24 hour;SNF     Equipment Recommendations  Other (comment)(defer to next venue)    Recommendations for Other Services Rehab consult     Precautions / Restrictions Precautions Precautions: Fall Restrictions Weight Bearing Restrictions: No    Mobility  Bed Mobility Overal bed mobility: Needs Assistance Bed Mobility: Sidelying to Sit;Rolling Rolling: Mod assist Sidelying to sit: HOB elevated;Mod assist       General bed mobility comments: cues to bring bilat LE to EOB and then to elevat trunk into sitting; cues for sequencing; use of rail   Transfers Overall transfer level: Needs assistance   Transfers: Sit to/from Stand Sit to Stand: Mod assist;+2 physical assistance         General transfer comment: assist to reach with R UE, power up into standing, and weight shift to midline; pt stood X 2 trials using Stedy standing frame  Ambulation/Gait                 Stairs             Wheelchair Mobility    Modified Rankin (Stroke Patients  Only) Modified Rankin (Stroke Patients Only) Pre-Morbid Rankin Score: Moderate disability Modified Rankin: Moderately severe disability     Balance Overall balance assessment: Needs assistance Sitting-balance support: Feet supported;Single extremity supported Sitting balance-Leahy Scale: Poor Sitting balance - Comments: pt able to maintain sitting balance EOB with assist to position R UE   Standing balance support: Bilateral upper extremity supported Standing balance-Leahy Scale: Poor                              Cognition Arousal/Alertness: Awake/alert Behavior During Therapy: Flat affect Overall Cognitive Status: Impaired/Different from baseline Area of Impairment: Following commands;Problem solving;Safety/judgement;Attention                   Current Attention Level: Sustained   Following Commands: Follows one step commands with increased time Safety/Judgement: Decreased awareness of deficits;Decreased awareness of safety Awareness: Intellectual Problem Solving: Slow processing;Requires verbal cues General Comments: pt with more intelligible speech at times; engaged/participatory during mobility tasks and interacting with son      Exercises      General Comments        Pertinent Vitals/Pain Pain Assessment: No/denies pain    Home Living                      Prior Function            PT Goals (current goals can now be found in the care plan  section) Progress towards PT goals: Progressing toward goals    Frequency    Min 3X/week      PT Plan Current plan remains appropriate    Co-evaluation              AM-PAC PT "6 Clicks" Mobility   Outcome Measure  Help needed turning from your back to your side while in a flat bed without using bedrails?: A Lot Help needed moving from lying on your back to sitting on the side of a flat bed without using bedrails?: A Lot Help needed moving to and from a bed to a chair (including  a wheelchair)?: A Lot Help needed standing up from a chair using your arms (e.g., wheelchair or bedside chair)?: A Lot Help needed to walk in hospital room?: Total Help needed climbing 3-5 steps with a railing? : Total 6 Click Score: 10    End of Session Equipment Utilized During Treatment: Gait belt Activity Tolerance: Patient tolerated treatment well Patient left: in chair;with call bell/phone within reach;with chair alarm set;with family/visitor present Nurse Communication: Mobility status PT Visit Diagnosis: Other abnormalities of gait and mobility (R26.89);Muscle weakness (generalized) (M62.81);Other symptoms and signs involving the nervous system (R29.898)     Time: [id number] PT Time Calculation (min) (ACUTE ONLY): 24 min  Charges:  $Gait Training: 8-22 mins $Therapeutic Activity: 8-22 mins                     Earney Navy, PTA Acute Rehabilitation Services Pager: [phone] Office: [phone]     Darliss Cheney 06/19/2019, 2:32 PM

## 2019-06-19 NOTE — Progress Notes (Signed)
  Speech Language Pathology Treatment: Dysphagia  Patient Details Name: William Mann MRN: [id number] DOB: Nov 29, 1929 Today's Date: 06/19/2019 Time: [id number] SLP Time Calculation (min) (ACUTE ONLY): 15 min  Assessment / Plan / Recommendation Clinical Impression  Assisted pt with last portion of breakfast this am.  He was alert, interactive, animated, making jokes.  With assist to hold Magic Cup in right hand, he self fed 100% of contents with intermittent verbal cues to check for anterior spillage and to swallow with effort.  He was able to follow-through with cues and agreed to try to maximize his oral intake.  His overall tolerance was excellent with no overt concerns for aspiration.  Pt required cues to slow down speech rate and over-emphasize production; speech intelligibility improved from 50-75%.  Pt reports he is much more comfortable without cortrak.  Recommend continued encouragement to self-feed; continue dysphagia 1/honey thick liquids.  Son, Richardson Landry, present for session and agrees with plan.   HPI HPI: William Mann is a 86 y.o. male with medical history significant of thyroid nodule, emphysema, GERD, right carotid endarterectomy,PVD, hypertension, hyperlipidemia, lower extremity DVT, bilateral hearing loss, presented with new onset of slurred speech and facial droop. Per chart wife also noticed significant right leg weakness, right-sided facial droop, and cough/choking after eating drink since last night. MRI shows small acute infarcts in the left basal ganglia/corona radiata and right thalamus. Pt with increasing lethargy prompting repeat MRI on 4/27 - this revealed extension of the left basal ganglia/radiating white matter tract infarction which is larger than on the initial presentation, measuring up to 2 cm in size presently.       SLP Plan  Continue with current plan of care       Recommendations  Diet recommendations: Dysphagia 1 (puree);Honey-thick  liquid Liquids provided via: Teaspoon;Cup Medication Administration: Crushed with puree Supervision: Staff to assist with self feeding Compensations: Minimize environmental distractions;Effortful swallow                Oral Care Recommendations: Oral care BID SLP Visit Diagnosis: Dysphagia, oropharyngeal phase (R13.12) Plan: Continue with current plan of care       GO                [name] 06/19/2019, 11:00 AM  Estill Bamberg L. Tivis Ringer, [place] Office number [phone] Pager [phone]

## 2019-06-19 NOTE — Progress Notes (Signed)
PROGRESS NOTE  William Mann [id number] DOB: 02-24-1929 DOA: 05/28/2019 PCP: Patient, No Pcp Per  HPI/Recap of past 46 hours: 86 year old male with history of muscular disease, hypertension, hyperlipidemia, DVT on Eliquis, permanent pacemaker, carotid stenosis status post right carotid endarterectomy, COVID-19 infection in January 2021 came to ED with slurred speech and facial droop as well as recurrent falls.  Initially came to ED ER on 05/27/2019 with a fall at that time he had negative neuro exam and was discharged home.  Subsequently developed slurred speech, unsteady gait, right leg weakness, right facial droop when coughing and choking after eating so he was brought back to the ER.  MRI brain showed small acute infarct in left basal ganglia/corona radiata and right thalamus.  Patient remained in the hospital with fluctuating mental status.  Patient was started on core track feeding tube.  IR was unable to image guided G-tube placement because of transverse colon front of stoma.  General surgery has been consulted.  Plan for G-tube placement on 06/16/2019.  Coretrack removed on 06/18/19.   06/19/19: Seen and examined.  Alert oriented x1.  More talkative today.  States he feels better after the [place] was removed yesterday.  Feeding assistance in place.  He is motivated to increase his oral intake, states he has been trying to feed himself.  We will continue to follow speech therapist recommendations, currently on dysphagia 1(pure), honey thick liquid diet.    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 Presented with right facial droop, right hemiplegia and dysphagia with altered mental status.   L BG/CR and R PLIC/thalamic infarcts secondary to small vessel disease   -CT head No acute abnormality. Small vessel disease. Atrophy.  -MRI  Small L basal ganglia / corona radiata and R thalamic infarcts. Small vessel disease. Atrophy.  -Repeated MRI 4/27: Extended BG stroke. -CTA head & neck no LVO. L ICA plaque w/ 50% stenosis. R CEA patent. R>L supraclinoid ICA and B PCA moderate to severe stenoses.  -LE Doppler  R gastrocnemius veing indeterminate thrombosis. R femoral w/ chronic DVT. -2D Echo EF 55%, LA mild dilation -LDL 160 -HgbA1c 5.0 -Patient is currently on aspirin 81 mg daily, Eliquis 5 mg twice daily, Zocor 40 mg daily  Dysphagia-secondary to acute stroke.   Repeat swallow evaluation showed some improvement with speech recs for honey thick liquids. Plan to dc coretrack and see how much po intake he can tolerate prior to deciding on G tube placement by surgery.  Speech and dietitian assisting.  Son made aware and is agreeable with this plan. Continue to advance diet as tolerated  Follow speech and dietitian recommendations  Continue aspiration precautions  Treated Aerococcus UTI-urine culture grew more than 100,000 colonies of Aerococcus.  Treated with amoxicillin.  Repeat urine culture on 06/13/2019 showed no growth.  Treated Aspiration pneumonia-patient developed leukocytosis on 06/12/2019, chest x-ray consistent with pneumonia.  Patient started on vancomycin, cefepime, IV Flagyl.  Blood cultures  negative to date.  Afebrile with no leukocytosis.  Procalcitonin<0.10. Dc abx 5/11. Continue aspirations precautions.  Ambulatory dysfunction post acute stroke Continue PT OT with assistance and fall precautions PT recommended SNF with 24 hours supervision/assistance  Moderate protein calorie malnutrition Albumin 2.2 BMI 26 with evidence of muscle mass loss Continue multivitamins Continue oral supplements Magic cup 3 times daily with meals     DVT prophylaxis: SCDs; Eliquis   Code Status: DNR  Family Communication: Updated family at bedside on 5/13    Status is: Inpatient  Dispo: The patient is from: Home.               Anticipated d/c is to: SNF               Anticipated d/c date is: 06/21/19              Barrier to discharge: Will need to meet his daily caloric requirements              Patient currently ongoing trial of oral intake         Objective: Vitals:   06/19/19 0354 06/19/19 0500 06/19/19 0735 06/19/19 1132  BP: 96/76  114/87 96/77  Pulse: 91  67 95  Resp: 18  18 18   Temp: 97.7 F (36.5 C)  97.9 F (36.6 C) 97.7 F (36.5 C)  TempSrc: Oral  Oral Oral  SpO2: 96%  97% 96%  Weight:  80.3 kg      Intake/Output Summary (Last 24 hours) at 06/19/2019 1454 Last data filed at 06/19/2019 0700 Gross per 24 hour  Intake 60 ml  Output 550 ml  Net -490 ml   Filed Weights   06/05/19 0349 06/15/19 0100 06/19/19 0500  Weight: 68.3 kg 81.2 kg 80.3 kg    Exam:  . General: 86 y.o. year-old male well-developed well-nourished in no acute distress.  Alert, talkative and cooperative.   . Cardiovascular: Regular rate and rhythm no rubs or gallops.   Marland Kitchen Respiratory: Clear to auscultation no wheezes no rales.   . Abdomen: Soft nontender normal bowel sounds present.   . Musculoskeletal: No lower extremity edema bilaterally.   Marland Kitchen Psychiatry: Mood is appropriate for condition and setting  Data Reviewed: CBC: Recent Labs  Lab 06/15/19 0502 06/16/19 0347  06/17/19 0601 06/18/19 0236 06/19/19 0706  WBC 9.2 8.5 9.0 10.9* 8.1  NEUTROABS 7.4 6.7 7.1 8.9* 5.9  HGB 12.3* 13.7 13.5 14.1 14.0  HCT 36.6* 40.9 40.7 41.8 41.8  MCV 101.9* 102.5* 102.5* 101.5* 103.7*  PLT 159 159 160 166 [id number]   Basic Metabolic Panel: Recent Labs  Lab 06/13/19 0456 06/17/19 0601  NA 137 139  K 3.9 4.0  CL 101 105  CO2 26 27  GLUCOSE 108* 96  BUN 27* 14  CREATININE 0.92 0.76  CALCIUM 8.2* 8.0*  MG 2.0 2.1  PHOS 3.3 3.0   GFR: Estimated Creatinine Clearance: 61.8 mL/min (by C-G formula based on SCr of 0.76 mg/dL). Liver Function Tests: Recent Labs  Lab 06/13/19 0456  AST 75*  ALT 128*  ALKPHOS 134*  BILITOT 1.1  PROT 5.2*  ALBUMIN 2.2*   No results for input(s): LIPASE, AMYLASE in the last 168 hours. No results for input(s): AMMONIA in the last 168 hours. Coagulation Profile: No results for  input(s): INR, PROTIME in the last 168 hours. Cardiac Enzymes: No results for input(s): CKTOTAL, CKMB, CKMBINDEX, TROPONINI in the last 168 hours. BNP (last 3 results) No results for input(s): PROBNP in the last 8760 hours. HbA1C: No results for input(s): HGBA1C in the last 72 hours. CBG: Recent Labs  Lab 06/18/19 2010 06/18/19 2353 06/19/19 0354 06/19/19 0738 06/19/19 1205  GLUCAP 96 99 91 84 102*   Lipid Profile: No results for input(s): CHOL, HDL, LDLCALC, TRIG, CHOLHDL, LDLDIRECT in the last 72 hours. Thyroid Function Tests: No results for input(s): TSH, T4TOTAL, FREET4, T3FREE, THYROIDAB in the last 72 hours. Anemia Panel: No results for input(s): VITAMINB12, FOLATE, FERRITIN, TIBC, IRON, RETICCTPCT in the last 72 hours. Urine analysis:    Component Value Date/Time   COLORURINE YELLOW 06/13/2019 [place] 06/13/2019 1333   LABSPEC 1.021 06/13/2019 1333   PHURINE 5.0 06/13/2019 1333   GLUCOSEU NEGATIVE 06/13/2019 1333   HGBUR NEGATIVE 06/13/2019 1333   BILIRUBINUR NEGATIVE 06/13/2019 1333   KETONESUR NEGATIVE 06/13/2019  1333   PROTEINUR NEGATIVE 06/13/2019 1333   UROBILINOGEN 1.0 10/08/2014 0214   NITRITE NEGATIVE 06/13/2019 1333   LEUKOCYTESUR NEGATIVE 06/13/2019 1333   Sepsis Labs: @LABRCNTIP (procalcitonin:4,lacticidven:4)  ) Recent Results (from the past 240 hour(s))  Culture, Urine     Status: Abnormal   Collection Time: 06/12/19  2:38 PM   Specimen: Urine, Clean Catch  Result Value Ref Range Status   Specimen Description URINE, CLEAN CATCH  Final   Special Requests   Final    NONE Performed at [facility], [place] [address], [place], [place] [address]    Culture MULTIPLE SPECIES PRESENT, SUGGEST RECOLLECTION (A)  Final   Report Status 06/13/2019 FINAL  Final  Culture, blood (routine x 2)     Status: None   Collection Time: 06/13/19 12:25 PM   Specimen: BLOOD  Result Value Ref Range Status   Specimen Description BLOOD RIGHT ANTECUBITAL  Final   Special Requests   Final    BOTTLES DRAWN AEROBIC AND ANAEROBIC Blood Culture adequate volume   Culture   Final    NO GROWTH 5 DAYS Performed at [facility], [place] [address], [place], [place] [address]    Report Status 06/18/2019 FINAL  Final  Culture, blood (routine x 2)     Status: None   Collection Time: 06/13/19 12:30 PM   Specimen: BLOOD  Result Value Ref Range Status   Specimen Description BLOOD LEFT ANTECUBITAL  Final   Special Requests   Final    BOTTLES DRAWN AEROBIC AND ANAEROBIC Blood Culture adequate volume   Culture   Final    NO GROWTH 5 DAYS Performed at [facility], [place] [address] [place], [place] [address]    Report Status 06/18/2019 FINAL  Final  Culture, Urine     Status: None   Collection Time: 06/13/19  1:35 PM   Specimen: Urine, Clean Catch  Result Value Ref Range Status   Specimen Description URINE, CLEAN CATCH  Final   Special Requests NONE  Final   Culture   Final    NO GROWTH Performed at [facility], [place] [address] [place], [place] [address]    Report Status 06/14/2019 FINAL   Final      Studies: No results found.  Scheduled Meds: . apixaban  5 mg Per Tube BID  . aspirin EC  81 mg Oral Once per day on Mon Thu  . chlorhexidine  15 mL Mouth Rinse BID  . doxazosin  1 mg Per Tube Daily  . mouth rinse  15 mL Mouth Rinse q12n4p  . melatonin  3 mg Per Tube QHS  . multivitamin with minerals  1 tablet Oral Daily  . simvastatin  40 mg Per Tube q1800  . sodium chloride  1 spray Each Nare QID  . sodium chloride flush  10-40 mL Intracatheter Q12H    Continuous Infusions: . lactated ringers 50 mL/hr at 06/19/19 0534     LOS: 22 days     Kayleen Memos, MD [facility] Pager [phone]  If 7PM-7AM, please contact night-coverage [email or web address] Password [facility] 06/19/2019, 2:54 PM

## 2019-06-20 ENCOUNTER — Telehealth: Payer: Self-pay | Admitting: *Deleted

## 2019-06-20 LAB — CBC WITH DIFFERENTIAL/PLATELET
Abs Immature Granulocytes: 0.07 10*3/uL (ref 0.00–0.07)
Basophils Absolute: 0.1 10*3/uL (ref 0.0–0.1)
Basophils Relative: 1 %
Eosinophils Absolute: 0.1 10*3/uL (ref 0.0–0.5)
Eosinophils Relative: 2 %
HCT: 40.9 % (ref 39.0–52.0)
Hemoglobin: 13.6 g/dL (ref 13.0–17.0)
Immature Granulocytes: 1 %
Lymphocytes Relative: 16 %
Lymphs Abs: 1.1 10*3/uL (ref 0.7–4.0)
MCH: 34.3 pg — ABNORMAL HIGH (ref 26.0–34.0)
MCHC: 33.3 g/dL (ref 30.0–36.0)
MCV: 103 fL — ABNORMAL HIGH (ref 80.0–100.0)
Monocytes Absolute: 0.6 10*3/uL (ref 0.1–1.0)
Monocytes Relative: 9 %
Neutro Abs: 5.2 10*3/uL (ref 1.7–7.7)
Neutrophils Relative %: 71 %
Platelets: 171 10*3/uL (ref 150–400)
RBC: 3.97 MIL/uL — ABNORMAL LOW (ref 4.22–5.81)
RDW: 13.7 % (ref 11.5–15.5)
WBC: 7.2 10*3/uL (ref 4.0–10.5)
nRBC: 0 % (ref 0.0–0.2)

## 2019-06-20 LAB — GLUCOSE, CAPILLARY
Glucose-Capillary: 91 mg/dL (ref 70–99)
Glucose-Capillary: 89 mg/dL (ref 70–99)
Glucose-Capillary: 122 mg/dL — ABNORMAL HIGH (ref 70–99)
Glucose-Capillary: 89 mg/dL (ref 70–99)
Glucose-Capillary: 92 mg/dL (ref 70–99)
Glucose-Capillary: 87 mg/dL (ref 70–99)

## 2019-06-20 MED ORDER — ACETAMINOPHEN 325 MG PO TABS
650.0000 mg | ORAL_TABLET | ORAL | Status: DC | PRN
  Administered 2019-06-23: 650 mg via ORAL
  Filled 2019-06-20: qty 2

## 2019-06-20 MED ORDER — ACETAMINOPHEN 160 MG/5ML PO SOLN: 650.0000 mg | ORAL | Status: DC | PRN

## 2019-06-20 MED ORDER — APIXABAN 5 MG PO TABS
5.0000 mg | ORAL_TABLET | Freq: Two times a day (BID) | ORAL | Status: DC
  Administered 2019-06-20 – 2019-06-25 (×11): 5 mg via ORAL
  Filled 2019-06-20 (×11): qty 1

## 2019-06-20 MED ORDER — POLYETHYLENE GLYCOL 3350 17 G PO PACK: 17.0000 g | PACK | Freq: Every day | ORAL | Status: DC | PRN

## 2019-06-20 MED ORDER — ACETAMINOPHEN 650 MG RE SUPP
650.0000 mg | RECTAL | Status: DC | PRN
  Administered 2019-06-23: 650 mg via RECTAL
  Filled 2019-06-20: qty 1

## 2019-06-20 NOTE — TOC Progression Note (Signed)
Transition of Care [facility]) - Progression Note    Patient Details  Name: Xane Amsden MRN: [id number] Date of Birth: 02-May-1929  Transition of Care [facility]) CM/SW [place], [place] Phone Number: 06/20/2019, 11:17 AM  Clinical Narrative:    CSW met bedside with patient's wife Polly to discuss PT recommendation of SNF placement at discharge. Patient's spouse inquired on CIR, CSW expressed CIR has been consulted and made the recommendation for SNF.  CSW provided spouse with bed offers. Spouse expressed [facility] possibly and that she will contact [facility].    Expected Discharge Plan: [place] Barriers to Discharge: Continued Medical Work up  Expected Discharge Plan and Services Expected Discharge Plan: [place] arrangements for the past 2 months: Single Family Home                                       Social Determinants of Health (SDOH) Interventions    Readmission Risk Interventions No flowsheet data found.

## 2019-06-20 NOTE — Progress Notes (Signed)
Occupational Therapy Treatment Patient Details Name: William Mann MRN: [id number] DOB: Sep 07, 1929 Today's Date: 06/20/2019    History of present illness Mr. Nekko Coslow is a 86 y.o. male with history of  R ICA stenosis s/p R CEA, PVD, hypertension, hyperlipidemia, lower extremity DVT on Eliquis, carotid artery occlusion, atherosclerosis and bilateral hearing loss presenting with dysarthria, facial droop, R leg weakness.  Found to have L BG/CR and R PLIC/thalamic infarcts. Repeat CT 4/26 and MRI 4/27 revealed extension of the left basal ganglia/radiating white matter tract infarction.   OT comments  Pt progressing slow but steady towards acute OT goals. Focus of session was sitting EOB a few minutes towards UB ADL goals and towards functional transfer goals. Pt sat EOB about 4 minutes before indicating fatigue. After needing max to mod A intitially to achieve midline and fully position RUE in supportive position, pt able to sit for a couple of minutes at close min guard level. Spouse present for part of session and reports SNF for rehab is still their plan. D/c plan remains appropriate.   Follow Up Recommendations  SNF    Equipment Recommendations  Other (comment)(defer to next venue)    Recommendations for Other Services      Precautions / Restrictions Precautions Precautions: Fall Restrictions Weight Bearing Restrictions: No       Mobility Bed Mobility Overal bed mobility: Needs Assistance Bed Mobility: Sidelying to Sit;Rolling Rolling: Mod assist Sidelying to sit: HOB elevated;Mod assist     Sit to sidelying: Max assist General bed mobility comments: despite multimodal cueing and pt demostrating ability to advance BLE back and forth across mattress, pt ultimately needed help to advance BLE off the bed and to power up trunk. Assist for all aspects to return to sidelying at end of session. +2 to scoot up in bed.   Transfers                 General  transfer comment: did not attempt this session due to availablilty of only +1 assist and pt needing +2 physical assist to stand.     Balance Overall balance assessment: Needs assistance Sitting-balance support: Feet supported;Single extremity supported Sitting balance-Leahy Scale: Poor Sitting balance - Comments: After inital assist to achieve midline and a little assist to fully position RUE, pt able to achieve min guard assist level to sit EOB a couple of minutes. BUE support utilized.  Postural control: Left lateral lean(R trunk weakness noted)                                 ADL either performed or assessed with clinical judgement   ADL Overall ADL's : Needs assistance/impaired                                       General ADL Comments: Increased ability to follow one-step commands. Able to sit EOB about 4 minutes. Needed max to mod A to position into midline but able to improve to min guard level for a couple of minutes. Pt with mild left lean. Sits in trunk flexed position but with cueing able to briefly achieve more upright posture. Able to verbalize fatigue and need to lay back down.      Vision       Quarry manager  Arousal/Alertness: Awake/alert Behavior During Therapy: WFL for tasks assessed/performed;Flat affect Overall Cognitive Status: Impaired/Different from baseline Area of Impairment: Following commands;Problem solving;Safety/judgement;Attention                   Current Attention Level: Sustained   Following Commands: Follows one step commands with increased time Safety/Judgement: Decreased awareness of deficits;Decreased awareness of safety Awareness: Intellectual Problem Solving: Slow processing;Requires verbal cues General Comments: when given 2 choices pt able to verbalize correct option "hospital". Difficult to fully assess orientation due to continued expressive difficulties. Difficulty  sequencing and needing tactile cues at times. Attempting at least one time to joke a bit with therapist. verbal output appears to have increased but remains with decreased intelligibility.         Exercises     Shoulder Instructions       General Comments spouse present    Pertinent Vitals/ Pain       Pain Assessment: Faces Faces Pain Scale: Hurts a little bit Pain Location: general discomfort Pain Descriptors / Indicators: Discomfort Pain Intervention(s): Limited activity within patient's tolerance;Monitored during session  Home Living                                          Prior Functioning/Environment              Frequency  Min 2X/week        Progress Toward Goals  OT Goals(current goals can now be found in the care plan section)  Progress towards OT goals: Progressing toward goals  Acute Rehab OT Goals Patient Stated Goal: to go to rehab OT Goal Formulation: With patient/family Time For Goal Achievement: 07/04/19 Potential to Achieve Goals: Fair ADL Goals Pt Will Perform Grooming: with min guard assist;sitting Pt Will Perform Upper Body Bathing: with min guard assist;sitting Pt Will Transfer to Toilet: with min assist;bedside commode;stand pivot transfer Additional ADL Goal #1: pt will complete bed mobility min (A) as precursor to adls.  Plan Discharge plan remains appropriate    Co-evaluation                 AM-PAC OT "6 Clicks" Daily Activity     Outcome Measure   Help from another person eating meals?: A Lot Help from another person taking care of personal grooming?: A Lot Help from another person toileting, which includes using toliet, bedpan, or urinal?: Total Help from another person bathing (including washing, rinsing, drying)?: A Lot Help from another person to put on and taking off regular upper body clothing?: A Lot Help from another person to put on and taking off regular lower body clothing?: Total 6 Click  Score: 10    End of Session    OT Visit Diagnosis: Unsteadiness on feet (R26.81);Muscle weakness (generalized) (M62.81);Other symptoms and signs involving cognitive function   Activity Tolerance Patient limited by fatigue   Patient Left in bed;with call bell/phone within reach;with nursing/sitter in room;with bed alarm set;with family/visitor present   Nurse Communication          Time: 1240-1310 OT Time Calculation (min): 30 min  Charges: OT General Charges $OT Visit: 1 Visit OT Treatments $Self Care/Home Management : 23-37 mins  Tyrone Schimke, OT Acute Rehabilitation Services Pager: [phone] Office: [phone]    Hortencia Pilar 06/20/2019, 1:31 PM

## 2019-06-20 NOTE — Progress Notes (Signed)
PROGRESS NOTE  William Mann [id number] DOB: 1929/09/13 DOA: 05/28/2019 PCP: Patient, No Pcp Per  HPI/Recap of past 54 hours: 86 year old male with history of muscular disease, hypertension, hyperlipidemia, DVT on Eliquis, permanent pacemaker, carotid stenosis status post right carotid endarterectomy, COVID-19 infection in January 2021 came to ED with slurred speech and facial droop as well as recurrent falls.  Initially came to ED ER on 05/27/2019 with a fall at that time he had negative neuro exam and was discharged home.  Subsequently developed slurred speech, unsteady gait, right leg weakness, right facial droop when coughing and choking after eating so he was brought back to the ER.  MRI brain showed small acute infarct in left basal ganglia/corona radiata and right thalamus.  Patient remained in the hospital with fluctuating mental status.  Patient was started on core track feeding tube.  IR was unable to image guided G-tube placement because of transverse colon front of stoma.  General surgery has been consulted.  Plan for G-tube placement on 06/16/2019.  Coretrack removed on 06/18/19, on dysphagia 1(pure), honey thick liquid diet.   06/20/19: Seen and examined.  No acute events overnight.  Working on improving his oral intake.  Seen by OT with recommendation for SNF.  TOC assisting with placement.     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 Presented with right facial droop, right hemiplegia and dysphagia with altered mental status.   L BG/CR and R PLIC/thalamic infarcts secondary to small vessel disease   -CT head No acute abnormality. Small vessel disease. Atrophy.  -MRI  Small L basal ganglia / corona radiata and R thalamic infarcts. Small vessel disease. Atrophy.  -Repeated MRI 4/27: Extended BG stroke. -CTA head & neck no LVO. L ICA plaque w/ 50% stenosis. R CEA patent. R>L supraclinoid ICA and B PCA moderate to severe stenoses.  -LE Doppler  R gastrocnemius veing indeterminate thrombosis. R femoral w/ chronic DVT. -2D Echo EF 55%, LA mild dilation -LDL 160 -HgbA1c 5.0 -Patient is currently on aspirin 81 mg daily, Eliquis 5 mg twice daily, Zocor 40 mg daily  Dysphagia-secondary to acute stroke.   Repeat swallow evaluation showed some improvement with speech recs for honey thick liquids. Culture discontinued Continue to advance diet as tolerated  Follow speech and dietitian recommendations  Continue aspiration precautions  Treated Aerococcus UTI-urine culture grew more than 100,000 colonies of Aerococcus.  Treated with amoxicillin.  Repeat urine culture on 06/13/2019 showed no growth.  Treated Aspiration pneumonia-patient developed leukocytosis on 06/12/2019, chest x-ray consistent with pneumonia.  Patient started on vancomycin, cefepime, IV Flagyl.  Blood cultures negative to date.  Afebrile with no leukocytosis.  Procalcitonin<0.10. Dc abx 5/11. Continue aspirations precautions.  Ambulatory dysfunction post acute stroke Continue PT OT with assistance  and fall precautions PT recommended SNF with 24 hours supervision/assistance  Moderate protein calorie malnutrition Albumin 2.2 BMI 26  with evidence of muscle mass loss Continue multivitamins Continue oral supplements Magic cup 3 times daily with meals     DVT prophylaxis: SCDs; Eliquis   Code Status: DNR  Family Communication: Updated family at bedside on 5/13 impression.    Status is: Inpatient  Dispo: The patient is from: Home.               Anticipated d/c is to: SNF               Anticipated d/c date is: 06/21/19              Barrier to discharge: Will need to meet his daily caloric requirements              Patient currently ongoing trial of oral intake         Objective: Vitals:   06/19/19 2333 06/20/19 0413 06/20/19 0750 06/20/19 1152  BP:  112/61 (!) 148/76 94/67  Pulse:  73 92 75  Resp:  19 18 18   Temp:  97.8 F (36.6 C) 97.7 F (36.5 C) 98.2 F (36.8 C)  TempSrc:  Oral Oral Oral  SpO2: 97% 96% 97% 94%  Weight:        Intake/Output Summary (Last 24 hours) at 06/20/2019 1349 Last data filed at 06/20/2019 1200 Gross per 24 hour  Intake 1805.79 ml  Output 1650 ml  Net 155.79 ml   Filed Weights   06/05/19 0349 06/15/19 0100 06/19/19 0500  Weight: 68.3 kg 81.2 kg 80.3 kg    Exam:  . General: 86 y.o. year-old male well-developed well-nourished in no acute distress.  Alert, pleasant, and interactive.   . Cardiovascular: Regular rate and rhythm no rubs or gallops.   Marland Kitchen Respiratory: Clear to Auscultation No Wheezes No Rales.   . Abdomen: Soft nontender normal bowel sounds present.   . Musculoskeletal: No lower extremity edema bilaterally. Marland Kitchen Psychiatry: Mood is appropriate for condition and setting.   Data Reviewed: CBC: Recent Labs  Lab 06/16/19 0347 06/17/19 0601 06/18/19 0236 06/19/19 0706 06/20/19 0255  WBC 8.5 9.0 10.9* 8.1 7.2  NEUTROABS 6.7 7.1 8.9* 5.9 5.2  HGB 13.7 13.5 14.1 14.0 13.6  HCT 40.9 40.7 41.8 41.8 40.9  MCV 102.5* 102.5* 101.5* 103.7* 103.0*  PLT 159 160 166 183 [id number]   Basic Metabolic Panel: Recent Labs  Lab 06/17/19 0601  NA 139  K 4.0  CL  105  CO2 27  GLUCOSE 96  BUN 14  CREATININE 0.76  CALCIUM 8.0*  MG 2.1  PHOS 3.0   GFR: Estimated Creatinine Clearance: 61.8 mL/min (by C-G formula based on SCr of 0.76 mg/dL). Liver Function Tests: No results for input(s): AST, ALT, ALKPHOS, BILITOT, PROT, ALBUMIN in the last 168 hours. No results for input(s): LIPASE, AMYLASE in the last 168 hours. No results for input(s): AMMONIA in the last 168 hours. Coagulation Profile: No results for input(s): INR, PROTIME in the last 168 hours. Cardiac Enzymes: No results for input(s): CKTOTAL, CKMB, CKMBINDEX, TROPONINI in the last 168 hours. BNP (last 3 results) No results for input(s): PROBNP in the last 8760 hours. HbA1C: No results for input(s): HGBA1C in the last 72 hours. CBG: Recent Labs  Lab 06/19/19 2003 06/19/19 2330 06/20/19 0405 06/20/19 0740 06/20/19 1150  GLUCAP 213* 91 91 89 122*   Lipid Profile: No results for input(s): CHOL, HDL, LDLCALC, TRIG,  CHOLHDL, LDLDIRECT in the last 72 hours. Thyroid Function Tests: No results for input(s): TSH, T4TOTAL, FREET4, T3FREE, THYROIDAB in the last 72 hours. Anemia Panel: No results for input(s): VITAMINB12, FOLATE, FERRITIN, TIBC, IRON, RETICCTPCT in the last 72 hours. Urine analysis:    Component Value Date/Time   COLORURINE YELLOW 06/13/2019 [place] 06/13/2019 1333   LABSPEC 1.021 06/13/2019 1333   PHURINE 5.0 06/13/2019 1333   GLUCOSEU NEGATIVE 06/13/2019 1333   HGBUR NEGATIVE 06/13/2019 1333   BILIRUBINUR NEGATIVE 06/13/2019 1333   KETONESUR NEGATIVE 06/13/2019 1333   PROTEINUR NEGATIVE 06/13/2019 1333   UROBILINOGEN 1.0 10/08/2014 0214   NITRITE NEGATIVE 06/13/2019 1333   LEUKOCYTESUR NEGATIVE 06/13/2019 1333   Sepsis Labs: @LABRCNTIP (procalcitonin:4,lacticidven:4)  ) Recent Results (from the past 240 hour(s))  Culture, Urine     Status: Abnormal   Collection Time: 06/12/19  2:38 PM   Specimen: Urine, Clean Catch  Result Value Ref Range  Status   Specimen Description URINE, CLEAN CATCH  Final   Special Requests   Final    NONE Performed at [facility], [place] [address], [place], [place] [address]    Culture MULTIPLE SPECIES PRESENT, SUGGEST RECOLLECTION (A)  Final   Report Status 06/13/2019 FINAL  Final  Culture, blood (routine x 2)     Status: None   Collection Time: 06/13/19 12:25 PM   Specimen: BLOOD  Result Value Ref Range Status   Specimen Description BLOOD RIGHT ANTECUBITAL  Final   Special Requests   Final    BOTTLES DRAWN AEROBIC AND ANAEROBIC Blood Culture adequate volume   Culture   Final    NO GROWTH 5 DAYS Performed at [facility], [place] [address], [place], [place] [address]    Report Status 06/18/2019 FINAL  Final  Culture, blood (routine x 2)     Status: None   Collection Time: 06/13/19 12:30 PM   Specimen: BLOOD  Result Value Ref Range Status   Specimen Description BLOOD LEFT ANTECUBITAL  Final   Special Requests   Final    BOTTLES DRAWN AEROBIC AND ANAEROBIC Blood Culture adequate volume   Culture   Final    NO GROWTH 5 DAYS Performed at [facility], [place] [address] [place], [place] [address]    Report Status 06/18/2019 FINAL  Final  Culture, Urine     Status: None   Collection Time: 06/13/19  1:35 PM   Specimen: Urine, Clean Catch  Result Value Ref Range Status   Specimen Description URINE, CLEAN CATCH  Final   Special Requests NONE  Final   Culture   Final    NO GROWTH Performed at [facility], [place] [address] [place], [place] [address]    Report Status 06/14/2019 FINAL  Final      Studies: No results found.  Scheduled Meds: . apixaban  5 mg Oral BID  . aspirin EC  81 mg Oral Once per day on Mon Thu  . chlorhexidine  15 mL Mouth Rinse BID  . doxazosin  1 mg Oral Daily  . mouth rinse  15 mL Mouth Rinse q12n4p  . melatonin  3 mg Oral QHS  . multivitamin with minerals  1 tablet Oral Daily  . simvastatin  40 mg Oral q1800  . sodium chloride  1 spray Each  Nare QID  . sodium chloride flush  10-40 mL Intracatheter Q12H    Continuous Infusions: . lactated ringers 50 mL/hr at 06/19/19 0534  LOS: 23 days     Kayleen Memos, MD [facility] Pager [phone]  If 7PM-7AM, please contact night-coverage [email or web address] Password [facility] 06/20/2019, 1:49 PM

## 2019-06-20 NOTE — Telephone Encounter (Signed)
Patient had COVID vaccine first shot- and then got COVID and had infusion. Patient is back in the hospital and his wife wants to know if he can get the second shot while he is in patient. Advised her that is something that the doctors taking care of him inpatient would have to decide/order. She was getting another call and had to disconnect.

## 2019-06-20 NOTE — Plan of Care (Signed)
  Problem: Coping: Goal: Will verbalize positive feelings about self Outcome: Progressing   Problem: Skin Integrity: Goal: Risk for impaired skin integrity will decrease Outcome: Progressing

## 2019-06-21 LAB — MAGNESIUM: Magnesium: 1.9 mg/dL (ref 1.7–2.4)

## 2019-06-21 LAB — CBC WITH DIFFERENTIAL/PLATELET
Abs Immature Granulocytes: 0.05 K/uL (ref 0.00–0.07)
Basophils Absolute: 0.1 K/uL (ref 0.0–0.1)
Basophils Relative: 1 %
Eosinophils Absolute: 0.1 K/uL (ref 0.0–0.5)
Eosinophils Relative: 2 %
HCT: 39.1 % (ref 39.0–52.0)
Hemoglobin: 12.7 g/dL — ABNORMAL LOW (ref 13.0–17.0)
Immature Granulocytes: 1 %
Lymphocytes Relative: 19 %
Lymphs Abs: 1.3 K/uL (ref 0.7–4.0)
MCH: 34 pg (ref 26.0–34.0)
MCHC: 32.5 g/dL (ref 30.0–36.0)
MCV: 104.8 fL — ABNORMAL HIGH (ref 80.0–100.0)
Monocytes Absolute: 0.6 K/uL (ref 0.1–1.0)
Monocytes Relative: 9 %
Neutro Abs: 4.5 K/uL (ref 1.7–7.7)
Neutrophils Relative %: 68 %
Platelets: 190 K/uL (ref 150–400)
RBC: 3.73 MIL/uL — ABNORMAL LOW (ref 4.22–5.81)
RDW: 13.8 % (ref 11.5–15.5)
WBC: 6.6 K/uL (ref 4.0–10.5)
nRBC: 0 % (ref 0.0–0.2)

## 2019-06-21 LAB — GLUCOSE, CAPILLARY
Glucose-Capillary: 105 mg/dL — ABNORMAL HIGH (ref 70–99)
Glucose-Capillary: 86 mg/dL (ref 70–99)
Glucose-Capillary: 98 mg/dL (ref 70–99)
Glucose-Capillary: 100 mg/dL — ABNORMAL HIGH (ref 70–99)
Glucose-Capillary: 97 mg/dL (ref 70–99)

## 2019-06-21 LAB — BASIC METABOLIC PANEL
Anion gap: 5 (ref 5–15)
BUN: 16 mg/dL (ref 8–23)
CO2: 28 mmol/L (ref 22–32)
Calcium: 8.1 mg/dL — ABNORMAL LOW (ref 8.9–10.3)
Chloride: 108 mmol/L (ref 98–111)
Creatinine, Ser: 0.9 mg/dL (ref 0.61–1.24)
GFR calc Af Amer: >60 mL/min (ref >60)
GFR calc non Af Amer: >60 mL/min (ref >60)
Glucose, Bld: 84 mg/dL (ref 70–99)
Potassium: 3.9 mmol/L (ref 3.5–5.1)
Sodium: 141 mmol/L (ref 135–145)

## 2019-06-21 LAB — SARS CORONAVIRUS 2 (TAT 6-24 HRS): SARS Coronavirus 2: NEGATIVE

## 2019-06-21 NOTE — Progress Notes (Signed)
CBG 87, per order [facility] informed.

## 2019-06-21 NOTE — TOC Progression Note (Signed)
Transition of Care [facility]) - Progression Note    Patient Details  Name: William Mann MRN: [id number] Date of Birth: 1929-12-14  Transition of Care [facility]) CM/SW [place], [place] Phone Number: 06/21/2019, 3:07 PM  Clinical Narrative:    CSW notified that patient is medically stable for discharge. CSW met bedside with patient and patient's spouse William Mann to discuss bed choice. Spouse expressed she is most interested in [place] and asked CSW to reach out again for bed availability.   CSW spoke with [facility] and confirmed they will have a bed available on Monday. CSW informed MD and confirmed Monday discharge. Covid test has been ordered.   Expected Discharge Plan: [place] Barriers to Discharge: Continued Medical Work up  Expected Discharge Plan and Services Expected Discharge Plan: [place] arrangements for the past 2 months: Single Family Home                                       Social Determinants of Health (SDOH) Interventions    Readmission Risk Interventions No flowsheet data found.

## 2019-06-21 NOTE — Progress Notes (Signed)
PROGRESS NOTE  William Mann [id number] DOB: Jan 11, 1930 DOA: 05/28/2019 PCP: Patient, No Pcp Per  HPI/Recap of past 63 hours: 86 year old male with history of muscular disease, hypertension, hyperlipidemia, DVT on Eliquis, permanent pacemaker, carotid stenosis status post right carotid endarterectomy, COVID-19 infection in January 2021 came to ED with slurred speech and facial droop as well as recurrent falls.  Initially came to ED ER on 05/27/2019 with a fall at that time he had negative neuro exam and was discharged home.  Subsequently developed slurred speech, unsteady gait, right leg weakness, right facial droop when coughing and choking after eating so he was brought back to the ER.  MRI brain showed small acute infarct in left basal ganglia/corona radiata and right thalamus.  Patient remained in the hospital with fluctuating mental status.  Patient was started on core track feeding tube.  IR was unable to image guided G-tube placement because of transverse colon front of stoma.  General surgery has been consulted.  Plan for G-tube placement on 06/16/2019.  Coretrack removed on 06/18/19, on dysphagia 1(pure), honey thick liquid diet.  06/21/19: Seen and examined.  No new complaints.  He is on a pured diet with honey thick liquids.  Eating about 50% of his meals.  Evaluated by PT OT with recommendation for SNF.  TOC assisting with SNF placement.     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 Presented with right facial droop, right hemiplegia and dysphagia with altered mental status.   L BG/CR and R PLIC/thalamic infarcts secondary to small vessel disease   -CT head No acute abnormality. Small vessel disease. Atrophy.  -MRI  Small L basal ganglia / corona radiata and R thalamic infarcts. Small vessel disease. Atrophy.  -Repeated MRI 4/27: Extended BG stroke. -CTA head & neck no LVO. L ICA plaque w/ 50% stenosis. R CEA patent. R>L supraclinoid ICA and B PCA moderate to severe stenoses.  -LE Doppler  R gastrocnemius veing indeterminate thrombosis. R femoral w/ chronic DVT. -2D Echo EF 55%, LA mild dilation -LDL 160 -HgbA1c 5.0 -Patient is currently on aspirin 81 mg daily, Eliquis 5 mg twice daily, Zocor 40 mg daily  Dysphagia-secondary to acute stroke.   Repeat swallow evaluation showed some improvement with speech recs for honey thick liquids. Continue to advance diet as tolerated  Follow speech and dietitian recommendations  Continue aspiration precautions  Treated Aerococcus UTI-urine culture grew more than 100,000 colonies of Aerococcus.  Treated with amoxicillin.  Repeat urine culture on 06/13/2019 showed no growth.  Treated Aspiration pneumonia-patient developed leukocytosis on 06/12/2019, chest x-ray consistent with pneumonia.  Patient started on vancomycin, cefepime, IV Flagyl.  Blood cultures negative to date.  Afebrile with no leukocytosis.  Procalcitonin<0.10. Dc abx 5/11. Continue aspirations precautions.  Ambulatory dysfunction post  acute stroke Continue PT OT with assistance and fall precautions PT recommended SNF with 24 hours supervision/assistance  Moderate protein calorie  malnutrition Albumin 2.2 BMI 26 with evidence of muscle mass loss Continue multivitamins Continue oral supplements Magic cup 3 times daily with meals     DVT prophylaxis: SCDs; Eliquis   Code Status: DNR  Family Communication: Updated family at bedside on 5/13 impression.    Status is: Inpatient  Dispo: The patient is from: Home.               Anticipated d/c is to: SNF               Anticipated d/c date is: 06/22/19              Barrier to discharge: Pending SNF placement                  Objective: Vitals:   06/21/19 0358 06/21/19 0427 06/21/19 0739 06/21/19 1142  BP: 132/72  135/77 122/61  Pulse: 82  81 83  Resp: 16  16 16   Temp: 97.8 F (36.6 C)  97.9 F (36.6 C) 98 F (36.7 C)  TempSrc:   Oral Oral  SpO2: 97%  97% 99%  Weight:  81.2 kg      Intake/Output Summary (Last 24 hours) at 06/21/2019 1315 Last data filed at 06/21/2019 0900 Gross per 24 hour  Intake 120 ml  Output 700 ml  Net -580 ml   Filed Weights   06/15/19 0100 06/19/19 0500 06/21/19 0427  Weight: 81.2 kg 80.3 kg 81.2 kg    Exam: No changes from previous exam.  . General: 86 y.o. year-old male well-developed well-nourished in no acute distress.  Alert, pleasant, and interactive.   . Cardiovascular: Regular rate and rhythm no rubs or gallops.   Marland Kitchen Respiratory: Clear to Auscultation No Wheezes No Rales.   . Abdomen: Soft nontender normal bowel sounds present.   . Musculoskeletal: No lower extremity edema bilaterally. Marland Kitchen Psychiatry: Mood is appropriate for condition and setting.   Data Reviewed: CBC: Recent Labs  Lab 06/17/19 0601 06/18/19 0236 06/19/19 0706 06/20/19 0255 06/21/19 0422  WBC 9.0 10.9* 8.1 7.2 6.6  NEUTROABS 7.1 8.9* 5.9 5.2 4.5  HGB 13.5 14.1 14.0 13.6 12.7*  HCT 40.7 41.8 41.8 40.9 39.1  MCV 102.5* 101.5* 103.7* 103.0* 104.8*  PLT 160 166 183 171 [id number]   Basic Metabolic Panel: Recent Labs  Lab 06/17/19 0601 06/21/19 0422  NA 139 141  K 4.0 3.9  CL  105 108  CO2 27 28  GLUCOSE 96 84  BUN 14 16  CREATININE 0.76 0.90  CALCIUM 8.0* 8.1*  MG 2.1 1.9  PHOS 3.0  --    GFR: Estimated Creatinine Clearance: 54.9 mL/min (by C-G formula based on SCr of 0.9 mg/dL). Liver Function Tests: No results for input(s): AST, ALT, ALKPHOS, BILITOT, PROT, ALBUMIN in the last 168 hours. No results for input(s): LIPASE, AMYLASE in the last 168 hours. No results for input(s): AMMONIA in the last 168 hours. Coagulation Profile: No results for input(s): INR, PROTIME in the last 168 hours. Cardiac Enzymes: No results for input(s): CKTOTAL, CKMB, CKMBINDEX, TROPONINI in the last 168 hours. BNP (last 3 results) No results for input(s): PROBNP in the last 8760 hours. HbA1C: No results for input(s): HGBA1C in the last 72 hours. CBG: Recent Labs  Lab 06/20/19 2031 06/20/19 2303 06/21/19 0420 06/21/19 0735 06/21/19 1141  GLUCAP 92 87 105* 86 98  Lipid Profile: No results for input(s): CHOL, HDL, LDLCALC, TRIG, CHOLHDL, LDLDIRECT in the last 72 hours. Thyroid Function Tests: No results for input(s): TSH, T4TOTAL, FREET4, T3FREE, THYROIDAB in the last 72 hours. Anemia Panel: No results for input(s): VITAMINB12, FOLATE, FERRITIN, TIBC, IRON, RETICCTPCT in the last 72 hours. Urine analysis:    Component Value Date/Time   COLORURINE YELLOW 06/13/2019 [place] 06/13/2019 1333   LABSPEC 1.021 06/13/2019 1333   PHURINE 5.0 06/13/2019 1333   GLUCOSEU NEGATIVE 06/13/2019 1333   HGBUR NEGATIVE 06/13/2019 1333   BILIRUBINUR NEGATIVE 06/13/2019 1333   KETONESUR NEGATIVE 06/13/2019 1333   PROTEINUR NEGATIVE 06/13/2019 1333   UROBILINOGEN 1.0 10/08/2014 0214   NITRITE NEGATIVE 06/13/2019 1333   LEUKOCYTESUR NEGATIVE 06/13/2019 1333   Sepsis Labs: @LABRCNTIP (procalcitonin:4,lacticidven:4)  ) Recent Results (from the past 240 hour(s))  Culture, Urine     Status: Abnormal   Collection Time: 06/12/19  2:38 PM   Specimen: Urine, Clean  Catch  Result Value Ref Range Status   Specimen Description URINE, CLEAN CATCH  Final   Special Requests   Final    NONE Performed at [facility], [place] [address] [place], [place] [address]    Culture MULTIPLE SPECIES PRESENT, SUGGEST RECOLLECTION (A)  Final   Report Status 06/13/2019 FINAL  Final  Culture, blood (routine x 2)     Status: None   Collection Time: 06/13/19 12:25 PM   Specimen: BLOOD  Result Value Ref Range Status   Specimen Description BLOOD RIGHT ANTECUBITAL  Final   Special Requests   Final    BOTTLES DRAWN AEROBIC AND ANAEROBIC Blood Culture adequate volume   Culture   Final    NO GROWTH 5 DAYS Performed at [facility], [place] [address] [place], [place] [address]    Report Status 06/18/2019 FINAL  Final  Culture, blood (routine x 2)     Status: None   Collection Time: 06/13/19 12:30 PM   Specimen: BLOOD  Result Value Ref Range Status   Specimen Description BLOOD LEFT ANTECUBITAL  Final   Special Requests   Final    BOTTLES DRAWN AEROBIC AND ANAEROBIC Blood Culture adequate volume   Culture   Final    NO GROWTH 5 DAYS Performed at [facility], [place] [address], [place], [place] [address]    Report Status 06/18/2019 FINAL  Final  Culture, Urine     Status: None   Collection Time: 06/13/19  1:35 PM   Specimen: Urine, Clean Catch  Result Value Ref Range Status   Specimen Description URINE, CLEAN CATCH  Final   Special Requests NONE  Final   Culture   Final    NO GROWTH Performed at [facility], [place] [address], [place], [place] [address]    Report Status 06/14/2019 FINAL  Final      Studies: No results found.  Scheduled Meds: . apixaban  5 mg Oral BID  . aspirin EC  81 mg Oral Once per day on Mon Thu  . chlorhexidine  15 mL Mouth Rinse BID  . doxazosin  1 mg Oral Daily  . mouth rinse  15 mL Mouth Rinse q12n4p  . melatonin  3 mg Oral QHS  . multivitamin with minerals  1 tablet Oral Daily  . simvastatin  40 mg Oral q1800    . sodium chloride  1 spray Each Nare QID  . sodium chloride flush  10-40 mL Intracatheter Q12H  Continuous Infusions:    LOS: 24 days     Kayleen Memos, MD [facility] Pager [phone]  If 7PM-7AM, please contact night-coverage [email or web address] Password [email or web address] 06/21/2019, 1:15 PM

## 2019-06-21 NOTE — Plan of Care (Signed)
  Problem: Education: Goal: Knowledge of disease or condition will improve Outcome: Progressing Goal: Knowledge of secondary prevention will improve Outcome: Progressing Goal: Knowledge of patient specific risk factors addressed and post discharge goals established will improve Outcome: Progressing Goal: Individualized Educational Video(s) Outcome: Progressing   Problem: Coping: Goal: Will verbalize positive feelings about self Outcome: Progressing Goal: Will identify appropriate support needs Outcome: Progressing   Problem: Health Behavior/Discharge Planning: Goal: Ability to manage health-related needs will improve Outcome: Progressing   Problem: Self-Care: Goal: Ability to participate in self-care as condition permits will improve Outcome: Progressing Goal: Verbalization of feelings and concerns over difficulty with self-care will improve Outcome: Progressing Goal: Ability to communicate needs accurately will improve Outcome: Progressing   Problem: Nutrition: Goal: Risk of aspiration will decrease Outcome: Progressing Goal: Dietary intake will improve Outcome: Progressing   Problem: Ischemic Stroke/TIA Tissue Perfusion: Goal: Complications of ischemic stroke/TIA will be minimized Outcome: Progressing   Problem: Education: Goal: Knowledge of General Education information will improve Description: Including pain rating scale, medication(s)/side effects and non-pharmacologic comfort measures Outcome: Progressing   Problem: Health Behavior/Discharge Planning: Goal: Ability to manage health-related needs will improve Outcome: Progressing   Problem: Clinical Measurements: Goal: Ability to maintain clinical measurements within normal limits will improve Outcome: Progressing Goal: Will remain free from infection Outcome: Progressing Goal: Diagnostic test results will improve Outcome: Progressing Goal: Respiratory complications will improve Outcome: Progressing Goal:  Cardiovascular complication will be avoided Outcome: Progressing   Problem: Activity: Goal: Risk for activity intolerance will decrease Outcome: Progressing   Problem: Nutrition: Goal: Adequate nutrition will be maintained Outcome: Progressing   Problem: Coping: Goal: Level of anxiety will decrease Outcome: Progressing   Problem: Pain Managment: Goal: General experience of comfort will improve Outcome: Progressing   Problem: Safety: Goal: Ability to remain free from injury will improve Outcome: Progressing   Problem: Skin Integrity: Goal: Risk for impaired skin integrity will decrease Outcome: Progressing

## 2019-06-22 LAB — GLUCOSE, CAPILLARY
Glucose-Capillary: 107 mg/dL — ABNORMAL HIGH (ref 70–99)
Glucose-Capillary: 115 mg/dL — ABNORMAL HIGH (ref 70–99)
Glucose-Capillary: 119 mg/dL — ABNORMAL HIGH (ref 70–99)
Glucose-Capillary: 132 mg/dL — ABNORMAL HIGH (ref 70–99)
Glucose-Capillary: 80 mg/dL (ref 70–99)
Glucose-Capillary: 89 mg/dL (ref 70–99)
Glucose-Capillary: 89 mg/dL (ref 70–99)

## 2019-06-22 NOTE — Plan of Care (Signed)

## 2019-06-22 NOTE — Progress Notes (Signed)
PROGRESS NOTE  William Mann [id number] DOB: [birth year] DOA: 05/28/2019 PCP: Patient, No Pcp Per  HPI/Recap of past 81 hours: 86 year old male with history of hypertension, hyperlipidemia, DVT on Eliquis, permanent pacemaker, carotid stenosis status post right carotid endarterectomy, COVID-19 infection in January 2021 came to [facility] ED from home with slurred speech and right sided facial droop as well as recurrent falls.  Initially came to ED ER on 05/27/2019 with a fall.  At that time he had negative neuro exam and was discharged home.  Subsequently developed slurred speech, unsteady gait, right leg weakness, right facial droop, coughing and choking after eating.   He was brought back to the ER.  MRI brain showed small acute infarct in left basal ganglia/corona radiata and right thalamus.  Patient remained in the hospital with fluctuating mental status.    Patient was started on core track feeding tube.  IR was unable to image guided G-tube placement because of transverse colon front of stoma.  General surgery was consulted.  Plan for G-tube placement on 06/16/2019 was canceled due to improvement of his dysphagia.  Coretrack tube feeding was removed on 06/18/19.  He was started on dysphagia 1(pure), honey thick liquid diet.  He is followed by speech therapy.  Feeding assistance in place but he has been feeding himself.  Mentation has been stable, no recent episode of delirium reported.  Plan for discharge to SNF likely tomorrow.  06/22/19:  No acute events overnight.  No new complaints.       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 Presented with right facial droop, right hemiplegia and dysphagia with altered mental status.   L BG/CR and R PLIC/thalamic infarcts secondary to small vessel disease   -CT head No acute abnormality. Small vessel disease. Atrophy.  -MRI  Small L basal ganglia / corona radiata and R thalamic infarcts. Small vessel disease. Atrophy.  -Repeated MRI 4/27: Extended BG stroke. -CTA head & neck no LVO. L ICA plaque w/ 50% stenosis. R CEA patent. R>L supraclinoid ICA and B PCA moderate to severe stenoses.  -LE Doppler  R gastrocnemius veing indeterminate thrombosis. R femoral w/ chronic DVT. -2D Echo EF 55%, LA mild dilation -LDL 160 -HgbA1c 5.0 -Patient is currently on aspirin 81 mg daily, Eliquis 5 mg twice daily, Zocor 40 mg daily  Dysphagia-secondary to acute stroke.   Repeat swallow evaluation showed some improvement with speech recs for honey thick liquids. Continue to advance diet as tolerated  Follow speech and dietitian recommendations  Continue aspiration precautions  Treated Aerococcus UTI-urine culture grew more than 100,000 colonies of Aerococcus.  Treated with amoxicillin.  Repeat urine culture on 06/13/2019 showed no growth.  Treated Aspiration pneumonia-patient developed leukocytosis on 06/12/2019, chest x-ray consistent with pneumonia.  Patient started on vancomycin, cefepime, IV Flagyl.  Blood cultures negative to date.  Afebrile with no leukocytosis.  Procalcitonin<0.10. Dc abx 5/11. Continue aspirations precautions.  Ambulatory dysfunction post acute stroke Continue PT  OT with assistance and fall precautions PT recommended SNF with 24 hours supervision/assistance  Moderate protein calorie malnutrition Albumin 2.2 BMI 26 with evidence of muscle mass loss Continue multivitamins Continue oral supplements Magic cup 3 times daily with meals     DVT prophylaxis: Eliquis   Code Status: DNR  Family Communication: Updated family at bedside on 5/13 impression.    Status is: Inpatient  Dispo: The patient is from: Home.               Anticipated d/c is to: SNF               Anticipated d/c date is: 06/23/19              Barrier to discharge: Medically cleared. Pending placement.                  Objective: Vitals:   06/22/19 0409 06/22/19 0410 06/22/19 0716 06/22/19 1223  BP: 122/89  121/76 104/60  Pulse: 81  85 83  Resp: 20  20 18   Temp: 98 F (36.7 C)  98.1 F (36.7 C) 97.7 F (36.5 C)  TempSrc: Oral  Oral Oral  SpO2: 97%  98% 96%  Weight:  81.2 kg      Intake/Output Summary (Last 24 hours) at 06/22/2019 1308 Last data filed at 06/22/2019 0900 Gross per 24 hour  Intake 100 ml  Output 1075 ml  Net -975 ml   Filed Weights   06/19/19 0500 06/21/19 0427 06/22/19 0410  Weight: 80.3 kg 81.2 kg 81.2 kg    Exam: No changes from previous exam.  . General: 86 y.o. year-old male well-developed well-nourished in no acute distress.  Alert, pleasant, and interactive.   . Cardiovascular: Regular rate and rhythm no rubs or gallops.   Marland Kitchen Respiratory: Clear to Auscultation No Wheezes No Rales.   . Abdomen: Soft nontender normal bowel sounds present.   . Musculoskeletal: No lower extremity edema bilaterally. Marland Kitchen Psychiatry: Mood is appropriate for condition and setting.   Data Reviewed: CBC: Recent Labs  Lab 06/17/19 0601 06/18/19 0236 06/19/19 0706 06/20/19 0255 06/21/19 0422  WBC 9.0 10.9* 8.1 7.2 6.6  NEUTROABS 7.1 8.9* 5.9 5.2 4.5  HGB 13.5 14.1 14.0 13.6 12.7*  HCT 40.7 41.8 41.8 40.9 39.1  MCV 102.5* 101.5* 103.7* 103.0*  104.8*  PLT 160 166 183 171 [id number]   Basic Metabolic Panel: Recent Labs  Lab 06/17/19 0601 06/21/19 0422  NA 139 141  K 4.0 3.9  CL 105 108  CO2 27 28  GLUCOSE 96 84  BUN 14 16  CREATININE 0.76 0.90  CALCIUM 8.0* 8.1*  MG 2.1 1.9  PHOS 3.0  --    GFR: Estimated Creatinine Clearance: 54.9 mL/min (by C-G formula based on SCr of 0.9 mg/dL). Liver Function Tests: No results for input(s): AST, ALT, ALKPHOS, BILITOT, PROT, ALBUMIN in the last 168 hours. No results for input(s): LIPASE, AMYLASE in the last 168 hours. No results for input(s): AMMONIA in the last 168 hours. Coagulation Profile: No results for input(s): INR, PROTIME in the last 168 hours. Cardiac Enzymes: No results for input(s): CKTOTAL, CKMB, CKMBINDEX, TROPONINI in the last 168 hours. BNP (last 3 results) No results for input(s): PROBNP in the last 8760 hours. HbA1C: No results for input(s):  HGBA1C in the last 72 hours. CBG: Recent Labs  Lab 06/21/19 2053 06/22/19 0020 06/22/19 0439 06/22/19 0713 06/22/19 1209  GLUCAP 97 132* 89 80 107*   Lipid Profile: No results for input(s): CHOL, HDL, LDLCALC, TRIG, CHOLHDL, LDLDIRECT in the last 72 hours. Thyroid Function Tests: No results for input(s): TSH, T4TOTAL, FREET4, T3FREE, THYROIDAB in the last 72 hours. Anemia Panel: No results for input(s): VITAMINB12, FOLATE, FERRITIN, TIBC, IRON, RETICCTPCT in the last 72 hours. Urine analysis:    Component Value Date/Time   COLORURINE YELLOW 06/13/2019 [place] 06/13/2019 1333   LABSPEC 1.021 06/13/2019 1333   PHURINE 5.0 06/13/2019 1333   GLUCOSEU NEGATIVE 06/13/2019 1333   HGBUR NEGATIVE 06/13/2019 1333   BILIRUBINUR NEGATIVE 06/13/2019 1333   KETONESUR NEGATIVE 06/13/2019 1333   PROTEINUR NEGATIVE 06/13/2019 1333   UROBILINOGEN 1.0 10/08/2014 0214   NITRITE NEGATIVE 06/13/2019 1333   LEUKOCYTESUR NEGATIVE 06/13/2019 1333   Sepsis  Labs: @LABRCNTIP (procalcitonin:4,lacticidven:4)  ) Recent Results (from the past 240 hour(s))  Culture, Urine     Status: Abnormal   Collection Time: 06/12/19  2:38 PM   Specimen: Urine, Clean Catch  Result Value Ref Range Status   Specimen Description URINE, CLEAN CATCH  Final   Special Requests   Final    NONE Performed at [facility], [place] [address], [place], [place] [address]    Culture MULTIPLE SPECIES PRESENT, SUGGEST RECOLLECTION (A)  Final   Report Status 06/13/2019 FINAL  Final  Culture, blood (routine x 2)     Status: None   Collection Time: 06/13/19 12:25 PM   Specimen: BLOOD  Result Value Ref Range Status   Specimen Description BLOOD RIGHT ANTECUBITAL  Final   Special Requests   Final    BOTTLES DRAWN AEROBIC AND ANAEROBIC Blood Culture adequate volume   Culture   Final    NO GROWTH 5 DAYS Performed at [facility], [place] [address] [place], [place] [address]    Report Status 06/18/2019 FINAL  Final  Culture, blood (routine x 2)     Status: None   Collection Time: 06/13/19 12:30 PM   Specimen: BLOOD  Result Value Ref Range Status   Specimen Description BLOOD LEFT ANTECUBITAL  Final   Special Requests   Final    BOTTLES DRAWN AEROBIC AND ANAEROBIC Blood Culture adequate volume   Culture   Final    NO GROWTH 5 DAYS Performed at [facility], [place] [address], [place], [place] [address]    Report Status 06/18/2019 FINAL  Final  Culture, Urine     Status: None   Collection Time: 06/13/19  1:35 PM   Specimen: Urine, Clean Catch  Result Value Ref Range Status   Specimen Description URINE, CLEAN CATCH  Final   Special Requests NONE  Final   Culture   Final    NO GROWTH Performed at [facility], [place] [address] [place], [place] [address]    Report Status 06/14/2019 FINAL  Final  SARS CORONAVIRUS 2 (TAT 6-24 HRS) Nasopharyngeal Nasopharyngeal Swab     Status: None   Collection Time: 06/21/19  2:22 PM   Specimen: Nasopharyngeal Swab   Result Value Ref Range Status   SARS Coronavirus 2 NEGATIVE NEGATIVE Final    Comment: (NOTE) SARS-CoV-2 target nucleic acids are NOT DETECTED. The SARS-CoV-2 RNA is generally detectable in upper and lower respiratory specimens during the acute phase of infection. Negative results do not preclude SARS-CoV-2  infection, do not rule out co-infections with other pathogens, and should not be used as the sole basis for treatment or other patient management decisions. Negative results must be combined with clinical observations, patient history, and epidemiological information. The expected result is Negative. Fact Sheet for Patients: SugarRoll.be Fact Sheet for Healthcare Providers: https://www.woods-mathews.com/ This test is not yet approved or cleared by the [place] FDA and  has been authorized for detection and/or diagnosis of SARS-CoV-2 by FDA under an Emergency Use Authorization (EUA). This EUA will remain  in effect (meaning this test can be used) for the duration of the COVID-19 declaration under Section 56 4(b)(1) of the Act, 21 U.S.C. section 360bbb-3(b)(1), unless the authorization is terminated or revoked sooner. Performed at [facility], [place] [address], [place], [place] [address]       Studies: No results found.  Scheduled Meds: . apixaban  5 mg Oral BID  . aspirin EC  81 mg Oral Once per day on Mon Thu  . chlorhexidine  15 mL Mouth Rinse BID  . doxazosin  1 mg Oral Daily  . mouth rinse  15 mL Mouth Rinse q12n4p  . melatonin  3 mg Oral QHS  . multivitamin with minerals  1 tablet Oral Daily  . simvastatin  40 mg Oral q1800  . sodium chloride  1 spray Each Nare QID  . sodium chloride flush  10-40 mL Intracatheter Q12H    Continuous Infusions:    LOS: 25 days     Kayleen Memos, MD [facility] Pager [phone]  If 7PM-7AM, please contact night-coverage [email or web address] Password [email or web address] 06/22/2019,  1:08 PM

## 2019-06-23 ENCOUNTER — Inpatient Hospital Stay (HOSPITAL_COMMUNITY): Payer: Medicare Other

## 2019-06-23 LAB — GLUCOSE, CAPILLARY
Glucose-Capillary: 86 mg/dL (ref 70–99)
Glucose-Capillary: 75 mg/dL (ref 70–99)
Glucose-Capillary: 102 mg/dL — ABNORMAL HIGH (ref 70–99)
Glucose-Capillary: 99 mg/dL (ref 70–99)
Glucose-Capillary: 120 mg/dL — ABNORMAL HIGH (ref 70–99)
Glucose-Capillary: 127 mg/dL — ABNORMAL HIGH (ref 70–99)

## 2019-06-23 LAB — CBC WITH DIFFERENTIAL/PLATELET
Abs Immature Granulocytes: 0.12 10*3/uL — ABNORMAL HIGH (ref 0.00–0.07)
Basophils Absolute: 0.1 10*3/uL (ref 0.0–0.1)
Basophils Relative: 0 %
Eosinophils Absolute: 0 10*3/uL (ref 0.0–0.5)
Eosinophils Relative: 0 %
HCT: 46.8 % (ref 39.0–52.0)
Hemoglobin: 15.6 g/dL (ref 13.0–17.0)
Immature Granulocytes: 1 %
Lymphocytes Relative: 3 %
Lymphs Abs: 0.6 10*3/uL — ABNORMAL LOW (ref 0.7–4.0)
MCH: 34.8 pg — ABNORMAL HIGH (ref 26.0–34.0)
MCHC: 33.3 g/dL (ref 30.0–36.0)
MCV: 104.5 fL — ABNORMAL HIGH (ref 80.0–100.0)
Monocytes Absolute: 0.8 10*3/uL (ref 0.1–1.0)
Monocytes Relative: 4 %
Neutro Abs: 21 10*3/uL — ABNORMAL HIGH (ref 1.7–7.7)
Neutrophils Relative %: 92 %
Platelets: 239 10*3/uL (ref 150–400)
RBC: 4.48 MIL/uL (ref 4.22–5.81)
RDW: 14.1 % (ref 11.5–15.5)
WBC: 22.6 10*3/uL — ABNORMAL HIGH (ref 4.0–10.5)
nRBC: 0 % (ref 0.0–0.2)

## 2019-06-23 LAB — BASIC METABOLIC PANEL
Anion gap: 10 (ref 5–15)
BUN: 17 mg/dL (ref 8–23)
CO2: 25 mmol/L (ref 22–32)
Calcium: 8.4 mg/dL — ABNORMAL LOW (ref 8.9–10.3)
Chloride: 106 mmol/L (ref 98–111)
Creatinine, Ser: 0.84 mg/dL (ref 0.61–1.24)
GFR calc Af Amer: >60 mL/min (ref >60)
GFR calc non Af Amer: >60 mL/min (ref >60)
Glucose, Bld: 104 mg/dL — ABNORMAL HIGH (ref 70–99)
Potassium: 4.1 mmol/L (ref 3.5–5.1)
Sodium: 141 mmol/L (ref 135–145)

## 2019-06-23 LAB — URINALYSIS, ROUTINE W REFLEX MICROSCOPIC
Bilirubin Urine: NEGATIVE
Glucose, UA: NEGATIVE mg/dL
Ketones, ur: NEGATIVE mg/dL
Nitrite: POSITIVE — AB
Protein, ur: NEGATIVE mg/dL
Specific Gravity, Urine: 1.018 (ref 1.005–1.030)
pH: 6 (ref 5.0–8.0)

## 2019-06-23 LAB — MAGNESIUM: Magnesium: 1.9 mg/dL (ref 1.7–2.4)

## 2019-06-23 LAB — MRSA PCR SCREENING: MRSA by PCR: NEGATIVE

## 2019-06-23 LAB — HEPATIC FUNCTION PANEL
ALT: 57 U/L — ABNORMAL HIGH (ref 0–44)
AST: 51 U/L — ABNORMAL HIGH (ref 15–41)
Albumin: 2.4 g/dL — ABNORMAL LOW (ref 3.5–5.0)
Alkaline Phosphatase: 104 U/L (ref 38–126)
Bilirubin, Direct: 0.2 mg/dL (ref 0.0–0.2)
Indirect Bilirubin: 0.9 mg/dL (ref 0.3–0.9)
Total Bilirubin: 1.1 mg/dL (ref 0.3–1.2)
Total Protein: 5.5 g/dL — ABNORMAL LOW (ref 6.5–8.1)

## 2019-06-23 LAB — PROCALCITONIN: Procalcitonin: <0.1 ng/mL

## 2019-06-23 LAB — PHOSPHORUS: Phosphorus: 3.2 mg/dL (ref 2.5–4.6)

## 2019-06-23 LAB — LACTIC ACID, PLASMA: Lactic Acid, Venous: 1.7 mmol/L (ref 0.5–1.9)

## 2019-06-23 MED ORDER — MELATONIN 3 MG PO TABS: 3.0000 mg | ORAL_TABLET | Freq: Every evening | ORAL | 0 refills | Status: AC | PRN

## 2019-06-23 MED ORDER — SODIUM CHLORIDE 0.9 % IV SOLN: INTRAVENOUS | Status: DC

## 2019-06-23 MED ORDER — MELATONIN 3 MG PO TABS: 3.0000 mg | ORAL_TABLET | Freq: Every day | ORAL | 0 refills | Status: DC

## 2019-06-23 MED ORDER — SIMVASTATIN 40 MG PO TABS: 40.0000 mg | ORAL_TABLET | Freq: Every day | ORAL | 0 refills | Status: AC

## 2019-06-23 MED ORDER — VANCOMYCIN HCL 1250 MG/250ML IV SOLN
1250.0000 mg | INTRAVENOUS | Status: DC
  Filled 2019-06-23: qty 250

## 2019-06-23 MED ORDER — SODIUM CHLORIDE 0.9 % IV SOLN
2.0000 g | Freq: Two times a day (BID) | INTRAVENOUS | Status: DC
  Administered 2019-06-23 – 2019-06-25 (×4): 2 g via INTRAVENOUS
  Filled 2019-06-23 (×4): qty 2

## 2019-06-23 MED ORDER — VANCOMYCIN HCL 1500 MG/300ML IV SOLN
1500.0000 mg | Freq: Once | INTRAVENOUS | Status: AC
  Administered 2019-06-23: 1500 mg via INTRAVENOUS
  Filled 2019-06-23: qty 300

## 2019-06-23 MED ORDER — DOXAZOSIN MESYLATE 1 MG PO TABS: 1.0000 mg | ORAL_TABLET | Freq: Every day | ORAL | 0 refills | Status: AC

## 2019-06-23 MED ORDER — ASPIRIN EC 81 MG PO TBEC: 81.0000 mg | DELAYED_RELEASE_TABLET | Freq: Every day | ORAL | 0 refills | Status: AC

## 2019-06-23 MED ORDER — SENNOSIDES-DOCUSATE SODIUM 8.6-50 MG PO TABS
2.0000 | ORAL_TABLET | Freq: Two times a day (BID) | ORAL | Status: DC
  Administered 2019-06-24 – 2019-06-25 (×2): 2 via ORAL
  Filled 2019-06-23 (×2): qty 2

## 2019-06-23 NOTE — Progress Notes (Signed)
CT head did not show intracranial hemorrhage or worsening infarcts.  EEG completed, awaiting results.  UA positive for pyuria.  Presumptive UTI until urine culture results.  Continue broad spectrum IV antibiotics empirically.  Will follow cultures and narrow down antibiotics according to ID and sensitivities. + R non obstructive nephrolithiasis without evidence of hydronephrosis.  Previously seen left flank subcutaneous hematoma decreasing in size.  We will continue to monitor and treat as indicated.

## 2019-06-23 NOTE — Progress Notes (Signed)
Reported fever and tachycardia by bedside RN Michelene Heady.  CXR showing no acute cardiopulmonary disease.  EKG SR, blocked PACs with pacemaker, no evidence of acute ischemia, no QTC prolongation.    Ordered CBC w diff, procalcitonin, and lactic acid to further assess.  Results are pending.  Will continue to follow.  Starting gentle IV fluid hydration NS at 75 cc/hr x 1 day.    Will hold off plan for discharge for now.

## 2019-06-23 NOTE — Discharge Summary (Signed)
Discharge Summary  William Mann [id number] DOB: [birth year]  PCP: Patient, No Pcp Per  Admit date: 05/28/2019 Discharge date: 06/23/2019  Time spent: 35 minutes  Recommendations for Outpatient Follow-up:  1. Follow-up with neurology 2. Follow-up with your cardiologist 3. Take your medications as prescribed 4. Continue PT OT with assistance and fall precautions.  Discharge Diagnoses:  Active Hospital Problems   Diagnosis Date Noted  . Protein-calorie malnutrition, severe 06/06/2019  . DNR (do not resuscitate)   . Palliative care encounter   . Dysphagia due to recent cerebral infarction   . Hemiplegia as late effect of cerebral infarction ([place])   . Advanced care planning/counseling discussion   . Goals of care, counseling/discussion   . 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 Palliative care by specialist   . Coronary artery disease involving native coronary artery of native heart without angina pectoris   . Deep venous thrombosis ([place])   . Fatty liver   . Dementia without behavioral disturbance ([place])   . Tachycardia   . Hypertension   . Leukocytosis   . Stroke (cerebrum) ([place]) 05/28/2019  . CVA (cerebral vascular accident) ([place]) 05/28/2019    Resolved Hospital Problems  No resolved problems to display.    Discharge Condition: Stable  Diet recommendation: Please abide to recommendations by speech therapist:  Recommendations  Diet recommendations: Dysphagia 1 (puree);Honey-thick liquid Liquids provided via: Teaspoon;Cup Medication Administration: Crushed with puree Supervision: Staff to assist with self feeding Compensations: Minimize environmental distractions;Effortful swallow               Oral Care Recommendations: Oral care BID SLP Visit Diagnosis: Dysphagia, oropharyngeal phase (R13.12) Plan: Continue with current plan of care        Vitals:   06/23/19 0344 06/23/19 0725  BP: 116/61 120/70  Pulse: 83 80  Resp: 18 16  Temp:  97.6 F (36.4 C) 97.7 F (36.5 C)  SpO2: 97% 97%    History of present illness:  86 year old male with history of hypertension, hyperlipidemia, DVT on Eliquis, permanent pacemaker, carotid artery stenosis status post right carotid endarterectomy, COVID-19 infection in January 2021 came to [facility] ED from home with slurred speech and right sided facial droop as well as recurrent falls. MRI brain showed small acute infarct in left basal ganglia/corona radiata and right thalamus.   Seen by neurology/stroke team.  Hospital course complicated by fluctuating mental status, worsening speech and right-sided weakness for which EEG was ordered and completed.  EEG was negative for seizures.  Repeated MRI done on 06/03/2019 showed extension of the left basal ganglia stroke.  Neuro team felt there was nothing further that could be done therefore palliative care team was consulted.   Due to dysphagia in the setting of acute CVA, he was started on core track feeding tube. IR was unable to place G-tube. General surgery was consulted. Initially planned for G-tube placement on 06/16/2019 but was canceled due to improvement of his dysphagia.  Coretrack tube was removed on 06/18/19 and he was started on dysphagia 1(pure), honey thick liquid diet which he has tolerated well.  He is followed by speech therapist.  Feeding assistance in place but he has been feeding himself, consuming at least 50% of his meals.    Mentation has been stable, no recent episode of delirium reported.  Plan to discharge to SNF for physical rehab.  06/23/19: No acute events overnight.  He has no new complaints.    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 Presented with right facial droop, right hemiplegia and dysphagia with altered mental status.  L BG/CR and R P LIC/thalamic infarcts secondary to smallvessel disease  -CT head No acute abnormality. Small vessel disease. Atrophy.  -MRI Small L basal ganglia / corona radiata and R thalamic infarcts. Small vessel disease. Atrophy. -Repeated MRI 4/27: Extended BG stroke. -CTA head &neck no LVO. L ICA plaque w/ 50% stenosis. R CEA patent. R>L supraclinoid ICA and B PCA moderate to severe stenoses.  -LE DopplerR femoral w/ chronic DVT. -2D EchoEF 55%, LA mild dilation -LDL160, goal LDL less than 70 -HgbA1c5.0, goal hemoglobin A1c less than 7.0, at goal. -Patient is currently on aspirin 81 mg daily, Eliquis 5 mg twice daily, Zocor 40 mg daily -Follow-up with neurology.  Dysphagia-secondary to acute stroke.  Repeat swallow evaluation showed some improvement with speech recommendations for dysphagia 1 diet, pure and honey thick liquids. Follow speech and dietitian recommendations  Continue aspiration precautions  Treated, Aerococcus UTI-urine culture grew more than 100,000 colonies of Aerococcus. Treated with amoxicillin. Repeat urine culture on 06/13/2019 showed no growth.  Treated, healthcare associated pneumonia with aspiration pneumonitis-patient developed leukocytosis on 06/12/2019, chest x-ray consistent with pneumonia. Suspected aspiration pneumonitis.  Patient was treated with IV  vancomycin, cefepime, and IV Flagyl.  Blood cultures negative to date.  Afebrile with no leukocytosis.  Procalcitonin<0.10. Dc abx 06/17/19. Continue aspirations precautions.  Ambulatory dysfunction post acute stroke Continue PT OT with assistance and fall precautions PT recommended SNF with 24 hours supervision/assistance  Moderate protein calorie malnutrition Albumin 2.2 BMI 26 with evidence of muscle mass loss Continue multivitamins Continue oral supplements Magic cup 3 times daily with meals  History of complete heart block status post pacemaker placement No acute issues Follow-up with cardiology and electrophysiology  BPH Continue doxazosin No evidence of urinary retention Continue to monitor urine output  History of DVT Continue Eliquis Follow-up with your PCP   Sleep disorder Continue melatonin at night as needed       DVT prophylaxis:Eliquis   Code Status:DNR   Consultations:  Neurology  Palliative care team  CIR  General surgery  Speech therapy  Discharge Exam: BP 120/70 (BP Location: Right Arm)   Pulse 80   Temp 97.7 F (36.5 C) (Oral)   Resp 16   Wt 79.8 kg   SpO2 97%   BMI 26.76 kg/m  . General: 86 y.o. year-old male well developed well nourished in no acute distress.  Alert, pleasant and cooperative. . Cardiovascular: Regular rate and rhythm with no rubs or gallops.  No thyromegaly or JVD noted.   Marland Kitchen Respiratory: Clear to auscultation with no wheezes or rales. Good inspiratory effort. . Abdomen: Soft nontender nondistended with normal bowel sounds x4 quadrants. . Musculoskeletal: No lower  extremity edema. 2/4 pulses in all 4 extremities. Marland Kitchen Psychiatry: Mood is appropriate for condition and setting  Discharge Instructions You were cared for by a hospitalist during your hospital stay. If you have any questions about your discharge medications or the care you received while you were in the hospital after you are discharged, you can  call the unit and asked to speak with the hospitalist on call if the hospitalist that took care of you is not available. Once you are discharged, your primary care physician will handle any further medical issues. Please note that NO REFILLS for any discharge medications will be authorized once you are discharged, as it is imperative that you return to your primary care physician (or establish a relationship with a primary care physician if you do not have one) for your aftercare needs so that they can reassess your need for medications and monitor your lab values.  Discharge Instructions    Ambulatory referral to Neurology   Complete by: As directed    Follow up with stroke clinic NP (Jessica Vanschaick or Cecille Rubin, if both not available, consider Zachery Dauer, or Ahern) at [facility] in about 4 weeks. Thanks.     Allergies as of 06/23/2019      Reactions   Atorvastatin Other (See Comments)   Liver inflammation    Rosuvastatin Other (See Comments)   Muscle pain   Viagra [sildenafil] Other (See Comments)   Reaction ??      Medication List    STOP taking these medications   tamsulosin 0.4 MG Caps capsule Commonly known as: FLOMAX   zolpidem 5 MG tablet Commonly known as: AMBIEN     TAKE these medications   apixaban 5 MG Tabs tablet Commonly known as: Eliquis Take 1 tablet (5 mg total) by mouth 2 (two) times daily. Resume 12/27/18 evening dose What changed: additional instructions   aspirin EC 81 MG tablet Take 1 tablet (81 mg total) by mouth daily. What changed: when to take this   CALTRATE 600 PO Take 600 mg by mouth 2 (two) times a week.   CoQ-10 100 MG Caps Take 100 mg by mouth daily.   cyanocobalamin 100 MCG tablet Take 100 mcg by mouth daily.   doxazosin 1 MG tablet Commonly known as: CARDURA Take 1 tablet (1 mg total) by mouth daily. Start taking on: Jun 24, 2019   melatonin 3 MG Tabs tablet Take 1 tablet (3 mg total) by mouth at bedtime as needed.    multivitamin tablet Take 1 tablet by mouth daily.   senna 8.6 MG tablet Commonly known as: SENOKOT Take 1 tablet by mouth 2 (two) times daily as needed for constipation.   simvastatin 40 MG tablet Commonly known as: ZOCOR Take 1 tablet (40 mg total) by mouth daily at 6 PM. What changed:   medication strength  how much to take  when to take this   VITAMIN D-3 PO Take 1 capsule by mouth daily.      Allergies  Allergen Reactions  . Atorvastatin Other (See Comments)    Liver inflammation    . Rosuvastatin Other (See Comments)    Muscle pain  . Viagra [Sildenafil] Other (See Comments)    Reaction ??    Contact information for follow-up providers    [facility]. Schedule an appointment as soon as possible for a visit in 4 week(s).   Specialty: Neurology Contact information: [address] [place] [phone]  Belva Crome, MD. Call in 1 day(s).   Specialty: Cardiology Why: Please call for a post hospital follow-up appointment. Contact information: [id number] [address] [phone]        Constance Haw, MD .   Specialty: Cardiology Contact information: [address] [place] [address] [phone]            Contact information for after-discharge care    Destination    [facility] .   Service: Skilled Nursing Contact information: [address] [place] [phone]                   The results of significant diagnostics from this hospitalization (including imaging, microbiology, ancillary and laboratory) are listed below for reference.    Significant Diagnostic Studies: CT ABDOMEN WO CONTRAST  Result Date: 06/11/2019 CLINICAL DATA:  Dysphagia. Evaluate anatomy prior to potential percutaneous gastrostomy tube placement. EXAM: CT ABDOMEN WITHOUT CONTRAST  TECHNIQUE: Multidetector CT imaging of the abdomen was performed following the standard protocol without IV contrast. COMPARISON:  CT abdomen pelvis-06/03/2013; chest CT-03/11/2014 FINDINGS: The lack of intravenous contrast limits the ability to evaluate solid abdominal organs. Examination is further degraded secondary to patient's overlying left upper extremity and patient respiratory artifact. Lower chest: Evaluation of lung bases is degraded secondary to respiratory artifact. Minimal dependent subpleural ground-glass atelectasis. No discrete focal airspace opacities. No pleural effusion. Cardiomegaly. Small pericardial effusion. Pacer leads terminate within the right atrium and right ventricular apex. Coronary artery calcifications. Hepatobiliary: Normal hepatic contour. Note is made of a approximately 0.9 cm hypoattenuating lesion within the caudal aspect the right lobe of the liver (image 29, series 3), which is incompletely characterized on the present examination though appears morphologically similar to the 05/2013 examination and thus favored to represent a hepatic cyst. Normal noncontrast appearance of the gallbladder given degree of distention. No radiopaque gallstones. No ascites. Pancreas: Normal noncontrast appearance of the pancreas. Spleen: Normal noncontrast appearance of the spleen. Adrenals/Urinary Tract: Punctate (approximately 0.8 cm) partially exophytic hypoattenuating lesion arising from the superomedial aspect the left kidney (image 33, series 3) is too small to accurately characterize though morphologically similar to the 2015 examination and favored to represent a renal cyst. Note is made of 2 punctate (3-4 mm) right-sided renal stones (images 29 and 34, series 3). No discrete left-sided renal lesions. There is a very minimal amount of grossly symmetric bilateral perinephric stranding, likely age and body habitus related. No urinary obstruction. Normal noncontrast appearance of the  bilateral adrenal glands. The urinary bladder was not imaged. Stomach/Bowel: The transverse colon is interposed between the anterior wall of the stomach and ventral abdominal wall. Enteric tube tip terminates within the gastric antrum/duodenal bulb. Enteric contrast is seen within the colon. Moderate colonic stool burden without evidence of enteric obstruction. Normal noncontrast appearance of the terminal ileum. The appendix is not visualized compatible with provided operative history. No pneumoperitoneum, pneumatosis or portal venous gas. Vascular/Lymphatic: Scattered atherosclerotic plaque within normal caliber abdominal aorta. No bulky retroperitoneal or mesenteric lymphadenopathy on this noncontrast examination. Other: Subcutaneous edema about the midline of the low back with suspected approximately 6.7 x 1.8 cm subcutaneous hematoma about the left side of low back (image 53, series 3), without associated fracture or radiopaque foreign body. Musculoskeletal: Stigmata of dish within the thoracic spine. Mild-to-moderate multilevel lumbar spine DDD, worse at L5-S1 with disc space height loss, endplate irregularity and sclerosis. IMPRESSION:  1. Gastric anatomy NOT amenable to attempted percutaneous gastrostomy tube placement due to interposition of the transverse colon. If gastrostomy tube placement is still desired, surgical consultation is advised. 2. Subcutaneous edema about the midline of the low back with suspected approximately 6.67 cm subcutaneous hematoma about the left-sided low back without associated fracture or radiopaque foreign body. Clinical correlation is advised. 3. Nonobstructing right-sided nephrolithiasis. 4. Coronary calcifications.  Aortic Atherosclerosis (ICD10-I70.0). Electronically Signed   By: Sandi Mariscal M.D.   On: 06/11/2019 17:01   CT ANGIO HEAD W OR WO CONTRAST  Result Date: 05/28/2019 CLINICAL DATA:  Follow-up examination for acute stroke. EXAM: CT ANGIOGRAPHY HEAD AND NECK  TECHNIQUE: Multidetector CT imaging of the head and neck was performed using the standard protocol during bolus administration of intravenous contrast. Multiplanar CT image reconstructions and MIPs were obtained to evaluate the vascular anatomy. Carotid stenosis measurements (when applicable) are obtained utilizing NASCET criteria, using the distal internal carotid diameter as the denominator. CONTRAST:  112mL OMNIPAQUE IOHEXOL 350 MG/ML SOLN COMPARISON:  Prior CT and MRI from earlier the same day. FINDINGS: CTA NECK FINDINGS Aortic arch: Visualized aortic arch a within normal limits for caliber. Bovine branch pattern with common origin of the right brachiocephalic and left common carotid artery noted. Mild atheromatous plaque within the arch itself. No hemodynamically significant stenosis about the origin of the great vessels. Visualized subclavian arteries patent without flow-limiting stenosis. Right carotid system: Right CCA tortuous proximally but is widely patent to the bifurcation without stenosis. Mixed eccentric plaque about the right bifurcation without hemodynamically significant stenosis. Probable changes related to previous right carotid endarterectomy. Right ICA widely patent distally to the skull base without stenosis, dissection, or occlusion eccentric plaque at the cervical petrous junction at the skull base with short-segment mild stenosis noted (series 6, image 225). Calcified atherosclerotic plaque seen within this region on prior noncontrast head CT. Left carotid system: Left CCA tortuous proximally but is widely patent to the bifurcation without significant stenosis. Bulky calcified plaque about the left bifurcation/proximal left ICA with associated stenosis of up to 50% by NASCET criteria. Left ICA mildly tortuous but otherwise widely patent distally to the skull base without stenosis, dissection or occlusion. Vertebral arteries: Both vertebral arteries arise from the subclavian arteries. Focal  plaque at the origin of the right vertebral artery with mild ostial stenosis. Left vertebral artery dominant. Vertebral arteries mildly tortuous but otherwise widely patent within the neck without stenosis, dissection or occlusion. Skeleton: No visible acute osseous abnormality. No worrisome lytic or blastic osseous lesions. Dens is somewhat angulated, nonspecific, but could be related to remotely healed trauma. Moderate cervical spondylosis present at C5-6 and C6-7. Other neck: No other acute soft tissue abnormality within the neck. No mass lesion or adenopathy. 7 mm right thyroid nodule noted, of doubtful significance given size and patient age. No follow-up imaging recommended regarding this lesion. Upper chest: Visualized upper chest demonstrates no acute finding. Scattered predominantly subpleural reticular linear densities could reflect atelectasis, scarring, or possibly fibrosis. Left-sided pacemaker/AICD noted. Review of the MIP images confirms the above findings CTA HEAD FINDINGS Anterior circulation: Petrous segments widely patent. Scattered atheromatous plaque throughout the cavernous/supraclinoid ICAs bilaterally. Associated short-segment severe stenosis seen at the supraclinoid right ICA (series 6, image 265). Moderate to severe short-segment stenosis present at the para clinoid left ICA as well (series 6, image 263). ICA termini well perfused. A1 segments patent bilaterally. Normal anterior communicating artery complex. Anterior cerebral arteries patent to their distal aspects without stenosis.  No M1 stenosis or occlusion. Normal MCA bifurcations. Distal MCA branches well perfused and symmetric. Posterior circulation: Dominant left vertebral artery widely patent to the vertebrobasilar junction without stenosis. Mild atheromatous irregularity within the right vertebral artery without stenosis. Both picas patent. Basilar widely patent to its distal aspect without stenosis. Superior cerebral arteries  patent bilaterally. Both PCAs primarily supplied via the basilar. Multifocal moderate to severe bilateral P2 stenoses noted. PCAs remain well perfused to their distal aspects. Venous sinuses: Patent allowing for timing the contrast bolus. Anatomic variants: None significant. 2 mm focal outpouching arising from the supraclinoid left ICA demonstrates a tiny vessel extending from its apex, felt to be most consistent with a vascular infundibulum related to a hypoplastic left posterior communicating artery. No visible aneurysm. Review of the MIP images confirms the above findings IMPRESSION: 1. Negative CTA for large vessel occlusion. 2. Bulky atheromatous plaque about the left carotid bifurcation with associated 50% stenosis by NASCET criteria. 3. Prior right carotid endarterectomy without significant residual or recurrent stenosis. 4. Moderate to severe bilateral stenoses involving the supraclinoid ICAs bilaterally, right worse than left. 5. Moderate to severe multifocal bilateral P2 stenoses. Electronically Signed   By: Jeannine Boga M.D.   On: 05/28/2019 22:23   DG Chest 2 View  Result Date: 05/28/2019 CLINICAL DATA:  Cough.  Chest pain.  Hypertension. EXAM: CHEST - 2 VIEW COMPARISON:  02/28/2019 FINDINGS: Pacer with leads at right atrium and right ventricle. No lead discontinuity. Midline trachea. Borderline cardiomegaly. Atherosclerosis in the transverse aorta. No pleural effusion or pneumothorax. No congestive failure. Mild left base scarring. IMPRESSION: No acute cardiopulmonary disease. Aortic Atherosclerosis (ICD10-I70.0). Electronically Signed   By: Abigail Miyamoto M.D.   On: 05/28/2019 13:55   DG Lumbar Spine Complete  Result Date: 05/28/2019 CLINICAL DATA:  Fall. EXAM: LUMBAR SPINE - COMPLETE 4+ VIEW COMPARISON:  October 08, 2014. FINDINGS: Diffuse osteopenia is noted. Moderate degenerative disc disease is noted at L1-2 and L2-3. Minimal retrolisthesis of L2-3 is noted. Mild degenerative disc  disease is noted at L5-S1. No fracture is noted. IMPRESSION: Multilevel degenerative disc disease. No acute abnormality seen in the lumbar spine. Electronically Signed   By: Marijo Conception M.D.   On: 05/28/2019 16:44   CT HEAD WO CONTRAST  Result Date: 06/02/2019 CLINICAL DATA:  Altered mental status. Possible cephalopathy. EXAM: CT HEAD WITHOUT CONTRAST TECHNIQUE: Contiguous axial images were obtained from the base of the skull through the vertex without intravenous contrast. COMPARISON:  May 28, 2019. FINDINGS: Brain: Mild diffuse cortical atrophy is noted. Mild chronic ischemic white matter disease is noted. Ventricular size is within normal limits. New low density is noted in left periventricular white matter measuring 2.0 x 1.3 cm concerning for acute infarction. Old lacunar infarction is noted more anteriorly which is unchanged compared to prior exam. No hemorrhage or mass lesion is noted. Vascular: No hyperdense vessel or unexpected calcification. Skull: Normal. Negative for fracture or focal lesion. Sinuses/Orbits: No acute finding. Other: None. IMPRESSION: New low density is noted in left periventricular white matter concerning for acute infarction. MRI may be performed for further evaluation. Electronically Signed   By: Marijo Conception M.D.   On: 06/02/2019 16:17   CT Head Wo Contrast  Result Date: 05/28/2019 CLINICAL DATA:  Altered mental status, ataxia EXAM: CT HEAD WITHOUT CONTRAST TECHNIQUE: Contiguous axial images were obtained from the base of the skull through the vertex without intravenous contrast. COMPARISON:  08/02/2005, 06/04/2014 FINDINGS: Brain: No evidence of acute infarction,  hemorrhage, hydrocephalus, extra-axial collection or mass lesion/mass effect. Focal encephalomalacia the left basal ganglia related to prior lacunar infarct. Scattered low-density changes within the periventricular and subcortical white matter compatible with chronic microvascular ischemic change. Moderate  diffuse cerebral volume loss. Vascular: Atherosclerotic calcifications involving the large vessels of the skull base. No unexpected hyperdense vessel. Skull: Normal. Negative for fracture or focal lesion. Sinuses/Orbits: No acute finding. Other: None. IMPRESSION: 1.  No acute intracranial findings. 2.  Chronic microvascular ischemic change and cerebral volume loss. Electronically Signed   By: Davina Poke D.O.   On: 05/28/2019 12:19   CT HEAD WO CONTRAST  Result Date: 05/27/2019 CLINICAL DATA:  Two fall EXAM: CT HEAD WITHOUT CONTRAST TECHNIQUE: Contiguous axial images were obtained from the base of the skull through the vertex without intravenous contrast. COMPARISON:  None. FINDINGS: Brain: No evidence of acute territorial infarction, hemorrhage, hydrocephalus,extra-axial collection or mass lesion/mass effect. There is dilatation the ventricles and sulci consistent with age-related atrophy. Low-attenuation changes in the deep white matter consistent with small vessel ischemia. Vascular: No hyperdense vessel or unexpected calcification. Skull: The skull is intact. No fracture or focal lesion identified. Mild soft tissue swelling seen over the right frontal skull. Sinuses/Orbits: The visualized paranasal sinuses and mastoid air cells are clear. The orbits and globes intact. Other: None Cervical spine: Alignment: There is a levoconvex scoliotic curvature of the cervical spine. Skull base and vertebrae: Visualized skull base is intact. No atlanto-occipital dissociation. The vertebral body heights are well maintained. No fracture or pathologic osseous lesion seen. There is ankylosis seen of the bilateral C2-C3 transverse processes. Soft tissues and spinal canal: The visualized paraspinal soft tissues are unremarkable. No prevertebral soft tissue swelling is seen. The spinal canal is grossly unremarkable, no large epidural collection or significant canal narrowing. Disc levels: Multilevel cervical spine  spondylosis is seen with disc height loss and anterior osteophytes, disc osteophyte complex and uncovertebral osteophytes which is most notable at C5-C6 and C6-C7 with moderate neural foraminal narrowing and mild central canal stenosis. Upper chest: The lung apices are clear. Thoracic inlet is within normal limits. Other: None IMPRESSION: No acute intracranial abnormality. Findings consistent with age related atrophy and chronic small vessel ischemia Mild soft tissue swelling seen over the right frontal skull. No acute fracture or malalignment of the spine. Cervical spine spondylosis most notable from C5-C7. Electronically Signed   By: Prudencio Pair M.D.   On: 05/27/2019 19:17   CT ANGIO NECK W OR WO CONTRAST  Result Date: 05/28/2019 CLINICAL DATA:  Follow-up examination for acute stroke. EXAM: CT ANGIOGRAPHY HEAD AND NECK TECHNIQUE: Multidetector CT imaging of the head and neck was performed using the standard protocol during bolus administration of intravenous contrast. Multiplanar CT image reconstructions and MIPs were obtained to evaluate the vascular anatomy. Carotid stenosis measurements (when applicable) are obtained utilizing NASCET criteria, using the distal internal carotid diameter as the denominator. CONTRAST:  166mL OMNIPAQUE IOHEXOL 350 MG/ML SOLN COMPARISON:  Prior CT and MRI from earlier the same day. FINDINGS: CTA NECK FINDINGS Aortic arch: Visualized aortic arch a within normal limits for caliber. Bovine branch pattern with common origin of the right brachiocephalic and left common carotid artery noted. Mild atheromatous plaque within the arch itself. No hemodynamically significant stenosis about the origin of the great vessels. Visualized subclavian arteries patent without flow-limiting stenosis. Right carotid system: Right CCA tortuous proximally but is widely patent to the bifurcation without stenosis. Mixed eccentric plaque about the right bifurcation without hemodynamically significant  stenosis. Probable changes related to previous right carotid endarterectomy. Right ICA widely patent distally to the skull base without stenosis, dissection, or occlusion eccentric plaque at the cervical petrous junction at the skull base with short-segment mild stenosis noted (series 6, image 225). Calcified atherosclerotic plaque seen within this region on prior noncontrast head CT. Left carotid system: Left CCA tortuous proximally but is widely patent to the bifurcation without significant stenosis. Bulky calcified plaque about the left bifurcation/proximal left ICA with associated stenosis of up to 50% by NASCET criteria. Left ICA mildly tortuous but otherwise widely patent distally to the skull base without stenosis, dissection or occlusion. Vertebral arteries: Both vertebral arteries arise from the subclavian arteries. Focal plaque at the origin of the right vertebral artery with mild ostial stenosis. Left vertebral artery dominant. Vertebral arteries mildly tortuous but otherwise widely patent within the neck without stenosis, dissection or occlusion. Skeleton: No visible acute osseous abnormality. No worrisome lytic or blastic osseous lesions. Dens is somewhat angulated, nonspecific, but could be related to remotely healed trauma. Moderate cervical spondylosis present at C5-6 and C6-7. Other neck: No other acute soft tissue abnormality within the neck. No mass lesion or adenopathy. 7 mm right thyroid nodule noted, of doubtful significance given size and patient age. No follow-up imaging recommended regarding this lesion. Upper chest: Visualized upper chest demonstrates no acute finding. Scattered predominantly subpleural reticular linear densities could reflect atelectasis, scarring, or possibly fibrosis. Left-sided pacemaker/AICD noted. Review of the MIP images confirms the above findings CTA HEAD FINDINGS Anterior circulation: Petrous segments widely patent. Scattered atheromatous plaque throughout the  cavernous/supraclinoid ICAs bilaterally. Associated short-segment severe stenosis seen at the supraclinoid right ICA (series 6, image 265). Moderate to severe short-segment stenosis present at the para clinoid left ICA as well (series 6, image 263). ICA termini well perfused. A1 segments patent bilaterally. Normal anterior communicating artery complex. Anterior cerebral arteries patent to their distal aspects without stenosis. No M1 stenosis or occlusion. Normal MCA bifurcations. Distal MCA branches well perfused and symmetric. Posterior circulation: Dominant left vertebral artery widely patent to the vertebrobasilar junction without stenosis. Mild atheromatous irregularity within the right vertebral artery without stenosis. Both picas patent. Basilar widely patent to its distal aspect without stenosis. Superior cerebral arteries patent bilaterally. Both PCAs primarily supplied via the basilar. Multifocal moderate to severe bilateral P2 stenoses noted. PCAs remain well perfused to their distal aspects. Venous sinuses: Patent allowing for timing the contrast bolus. Anatomic variants: None significant. 2 mm focal outpouching arising from the supraclinoid left ICA demonstrates a tiny vessel extending from its apex, felt to be most consistent with a vascular infundibulum related to a hypoplastic left posterior communicating artery. No visible aneurysm. Review of the MIP images confirms the above findings IMPRESSION: 1. Negative CTA for large vessel occlusion. 2. Bulky atheromatous plaque about the left carotid bifurcation with associated 50% stenosis by NASCET criteria. 3. Prior right carotid endarterectomy without significant residual or recurrent stenosis. 4. Moderate to severe bilateral stenoses involving the supraclinoid ICAs bilaterally, right worse than left. 5. Moderate to severe multifocal bilateral P2 stenoses. Electronically Signed   By: Jeannine Boga M.D.   On: 05/28/2019 22:23   CT CERVICAL SPINE WO  CONTRAST  Result Date: 05/27/2019 CLINICAL DATA:  Two fall EXAM: CT HEAD WITHOUT CONTRAST TECHNIQUE: Contiguous axial images were obtained from the base of the skull through the vertex without intravenous contrast. COMPARISON:  None. FINDINGS: Brain: No evidence of acute territorial infarction, hemorrhage, hydrocephalus,extra-axial collection or mass lesion/mass effect.  There is dilatation the ventricles and sulci consistent with age-related atrophy. Low-attenuation changes in the deep white matter consistent with small vessel ischemia. Vascular: No hyperdense vessel or unexpected calcification. Skull: The skull is intact. No fracture or focal lesion identified. Mild soft tissue swelling seen over the right frontal skull. Sinuses/Orbits: The visualized paranasal sinuses and mastoid air cells are clear. The orbits and globes intact. Other: None Cervical spine: Alignment: There is a levoconvex scoliotic curvature of the cervical spine. Skull base and vertebrae: Visualized skull base is intact. No atlanto-occipital dissociation. The vertebral body heights are well maintained. No fracture or pathologic osseous lesion seen. There is ankylosis seen of the bilateral C2-C3 transverse processes. Soft tissues and spinal canal: The visualized paraspinal soft tissues are unremarkable. No prevertebral soft tissue swelling is seen. The spinal canal is grossly unremarkable, no large epidural collection or significant canal narrowing. Disc levels: Multilevel cervical spine spondylosis is seen with disc height loss and anterior osteophytes, disc osteophyte complex and uncovertebral osteophytes which is most notable at C5-C6 and C6-C7 with moderate neural foraminal narrowing and mild central canal stenosis. Upper chest: The lung apices are clear. Thoracic inlet is within normal limits. Other: None IMPRESSION: No acute intracranial abnormality. Findings consistent with age related atrophy and chronic small vessel ischemia Mild soft  tissue swelling seen over the right frontal skull. No acute fracture or malalignment of the spine. Cervical spine spondylosis most notable from C5-C7. Electronically Signed   By: Prudencio Pair M.D.   On: 05/27/2019 19:17   MR BRAIN WO CONTRAST  Result Date: 06/03/2019 CLINICAL DATA:  Follow-up left periventricular white matter stroke. Small acute infarctions in the left basal ganglia and right thalamus. EXAM: MRI HEAD WITHOUT CONTRAST TECHNIQUE: Multiplanar, multiecho pulse sequences of the brain and surrounding structures were obtained without intravenous contrast. COMPARISON:  Head CT yesterday.  MRI 05/28/2019 FINDINGS: Brain: There is expected evolutionary change within the right lateral thalamic infarction which is losing its restricted diffusion. No evidence of progression or extension of that infarction. There has been progression of acute infarction within the left basal ganglia and radiating white matter tracts compared to the study of 6 days ago. Evidence of hemorrhage or mass effect. No other acute infarction. No focal abnormality affects the brainstem. There is cerebellar atrophy. Cerebral hemispheres show generalized atrophy with chronic small vessel disease of the white matter and old infarctions in the left basal ganglia region. No large vessel territory infarction. Ex vacuo enlargement of the left lateral ventricle. No obstructive hydrocephalus. No extra-axial collection. Vascular: Major vessels at the base of the brain show flow. Skull and upper cervical spine: Negative Sinuses/Orbits: Clear/normal Other: None IMPRESSION: Extension of the left basal ganglia/radiating white matter tract infarction which is larger than on the initial presentation, measuring up to 2 cm in size presently. No sign of hemorrhage or mass effect. Expected evolutionary changes in the small right lateral thalamic acute infarction without progression. Extensive chronic small-vessel ischemic changes elsewhere throughout the  deep brain as seen previously. Generalized brain atrophy. Electronically Signed   By: Nelson Chimes M.D.   On: 06/03/2019 17:36   MR BRAIN WO CONTRAST  Result Date: 05/28/2019 CLINICAL DATA:  Ataxia, stroke suspected. EXAM: MRI HEAD WITHOUT CONTRAST TECHNIQUE: Multiplanar, multiecho pulse sequences of the brain and surrounding structures were obtained without intravenous contrast. COMPARISON:  Head CT 05/28/2019 and MRI 06/04/2014 FINDINGS: Brain: There is a small acute infarct involving the left corona radiata and basal ganglia posterior to a chronic infarct,  and there is also a small acute infarct along the lateral margin of the right thalamus. There is ex vacuo dilatation of the frontal horn of the left lateral ventricle. A few scattered small chronic cerebral hemorrhages are unchanged. T2 hyperintensities in the cerebral white matter bilaterally have slightly progressed from the prior MRI and are nonspecific but compatible with mild chronic small vessel ischemic disease. No mass, midline shift, or extra-axial fluid collection is identified. There is moderate cerebral and cerebellar atrophy. Vascular: Major intracranial vascular flow voids are preserved. Skull and upper cervical spine: Unremarkable bone marrow signal. Sinuses/Orbits: Unremarkable orbits. Minimal mucosal thickening in the paranasal sinuses. Clear paranasal sinuses. Other: Postoperative changes to the right parietal scalp. IMPRESSION: 1. Small acute infarcts in the left basal ganglia/corona radiata and right thalamus. 2. Chronic small vessel ischemia and moderate cerebral atrophy. Electronically Signed   By: Logan Bores M.D.   On: 05/28/2019 15:41   DG Pelvis Portable  Result Date: 05/27/2019 CLINICAL DATA:  86 year old male with fall. EXAM: PORTABLE PELVIS 1-2 VIEWS COMPARISON:  None. FINDINGS: There is no acute fracture or dislocation. The bones are osteopenic. Mild bilateral hip osteoarthritic changes. There is degenerative changes of  the lower lumbar spine. The soft tissues are unremarkable. IMPRESSION: No acute fracture or dislocation. Electronically Signed   By: Anner Crete M.D.   On: 05/27/2019 16:50   DG CHEST PORT 1 VIEW  Result Date: 06/13/2019 CLINICAL DATA:  Pneumonia EXAM: PORTABLE CHEST 1 VIEW COMPARISON:  CT 06/11/2019, chest radiograph 06/05/2019, 12/24/2018 FINDINGS: Slight increased ground-glass opacity at the peripheral left lung base. Low lung volumes. Enlarged cardiomediastinal silhouette. Stable left-sided pacing device. Esophageal tube tip overlies the distal stomach. Mild atelectasis at the right base. IMPRESSION: New small focal opacity at the peripheral left lung base may reflect a small focus of pneumonia. Cardiomegaly without edema Electronically Signed   By: Donavan Foil M.D.   On: 06/13/2019 16:20   DG CHEST PORT 1 VIEW  Result Date: 06/05/2019 CLINICAL DATA:  Complication of feeding tube. EXAM: PORTABLE CHEST 1 VIEW COMPARISON:  June 02, 2019 FINDINGS: There is a dual lead AICD. The nasogastric tube seen on the prior study has been removed. Mild diffuse chronic appearing increased lung markings are seen without evidence of acute infiltrate, pleural effusion or pneumothorax. The cardiac silhouette is mildly enlarged. Multilevel degenerative changes seen throughout the thoracic spine. IMPRESSION: Chronic appearing increased lung markings without evidence of acute or active cardiopulmonary disease. Electronically Signed   By: Virgina Norfolk M.D.   On: 06/05/2019 01:50   DG CHEST PORT 1 VIEW  Result Date: 06/02/2019 CLINICAL DATA:  Shortness of breath EXAM: PORTABLE CHEST 1 VIEW COMPARISON:  05/29/2019 FINDINGS: Single frontal view of the chest demonstrates enteric catheter passing below diaphragm tip overlying gastric antrum. Dual lead pacemaker overlying left chest unchanged. Cardiac silhouette is enlarged but stable. Mild atherosclerosis aortic arch unchanged. No airspace disease, effusion, or  pneumothorax. Stable scarring at the left base. No acute bony abnormalities. IMPRESSION: 1. Enteric catheter as above. 2. No acute intrathoracic process. Electronically Signed   By: Randa Ngo M.D.   On: 06/02/2019 15:41   DG CHEST PORT 1 VIEW  Result Date: 05/29/2019 CLINICAL DATA:  Shortness of breath EXAM: PORTABLE CHEST 1 VIEW COMPARISON:  05/28/2019 FINDINGS: Single frontal view of the chest demonstrates stable dual lead pacemaker. Cardiac silhouette is unremarkable. No airspace disease, effusion, or pneumothorax. Minimal atherosclerosis within the aorta. No acute bony abnormalities. IMPRESSION: 1. Stable exam, no  acute process. Electronically Signed   By: Randa Ngo M.D.   On: 05/29/2019 15:27   DG Swallowing Func-Speech Pathology  Result Date: 06/16/2019 Objective Swallowing Evaluation: Type of Study: MBS-Modified Barium Swallow Study  Patient Details Name: William Mann MRN: [id number] Date of Birth: [birth year] Today's Date: 06/16/2019 Time: SLP Start Time (ACUTE ONLY): 1240 -SLP Stop Time (ACUTE ONLY): 1315 SLP Time Calculation (min) (ACUTE ONLY): 35 min Past Medical History: Past Medical History: Diagnosis Date . Aortic atherosclerosis ([place])  . Bilateral hearing loss  . BPH (benign prostatic hyperplasia)  . Cancer ([place])   basal cell carcinoma . Carotid artery occlusion  . Constipation  . Coronary artery disease  . COVID-19  . DVT (deep venous thrombosis) ([place])  . Emphysema lung ([place])  . Fatty liver  . GERD (gastroesophageal reflux disease)  . History of echocardiogram   Echo 4/17: Vigorous LVEF, EF 65-70%, normal wall motion, grade 1 diastolic dysfunction, mildly dilated ascending aorta (40 mm) . Hyperlipidemia  . Hypertension  . Insomnia  . PVD (peripheral vascular disease) ([place])  . Thyroid nodule  Past Surgical History: Past Surgical History: Procedure Laterality Date . APPENDECTOMY   . CAROTID ENDARTERECTOMY  03-05-09  right CEA . COLONOSCOPY  Oct. 2013  one polyp . HERNIA REPAIR   .  LIPOMA EXCISION   . PACEMAKER IMPLANT N/A 12/23/2018  Procedure: PACEMAKER IMPLANT;  Surgeon: Evans Lance, MD;  Location: [facility];  Service: Cardiovascular;  Laterality: N/A; HPI: Nasire Albares is a 86 y.o. male with medical history significant of thyroid nodule, emphysema, GERD, right carotid endarterectomy,PVD, hypertension, hyperlipidemia, lower extremity DVT, bilateral hearing loss, presented with new onset of slurred speech and facial droop. Per chart wife also noticed significant right leg weakness, right-sided facial droop, and cough/choking after eating drink since last night. MRI shows small acute infarcts in the left basal ganglia/corona radiata and right thalamus. Pt with increasing lethargy prompting repeat MRI on 4/27 - this revealed extension of the left basal ganglia/radiating white matter tract infarction which is larger than on the initial presentation, measuring up to 2 cm in size presently.  Subjective: alert Assessment / Plan / Recommendation CHL IP CLINICAL IMPRESSIONS 06/16/2019 Clinical Impression Pt continues to present with a moderate-severe oropharyngeal dysphagia, with marginal improvements noted in all phases of the swallow.  Oral phase is marked by impaired but improving control/cohesion with right lateral sulci residual, anterior spill (pt demonstrates improved lip seal when cued to not let material escape) and delayed transfer. Propulsion into pharynx is also marginally improved, with improved base-of-tongue to pharyngeal wall contact. Today there were occasions of improved inversion of epiglottis over larynx.  Incomplete laryngeal vestibule closure and delays in closure continue to lead to penetration of all tested consistencies (thin, nectar, honey, and puree)- notable during today's study was pt's response to penetration, which was to swallow again (this action was not observed on prior studies). There remains ongoing, trace aspiration of thin and nectar  consistencies, evoking a cough response.  Nectar, honey, and pureed barium continue to pool in valleculae, but are now passing with greater ease through the UES.  There is minimal residue in pyriforms (much improved).  Discussed study with pt's son, Richardson Landry.  Mr. Vaclavik is unlikely to be able to meet his needs via POs alone given degree of impairment. He is, however, showing improved sensory-motor function and it is reasonable for him to begin eating a greater variety of consistencies - in small quantities- both therapeutically  with SLP and with full supervision of staff/family.  He may continue to aspirate in trace amounts.  Richardson Landry verbalizes moving forward with PEG with PO supplementation for pleasure and therapeutic purposes.  Recommend allowing small quantities of food/drink (this may amount to only 5-6 boluses of material); start with purees from floor stock (pudding, applesauce, yogurt) and small sips of honey-thick liquid.  Pt may still have ice chips AFTER ORAL CARE and 30 MIN AFTER PO INTAKE.  Richardson Landry verbalizes agreement with plan.  SLP Visit Diagnosis Dysphagia, oropharyngeal phase (R13.12) Attention and concentration deficit following -- Frontal lobe and executive function deficit following -- Impact on safety and function Moderate aspiration risk   CHL IP TREATMENT RECOMMENDATION 06/16/2019 Treatment Recommendations Therapy as outlined in treatment plan below   Prognosis 06/09/2019 Prognosis for Safe Diet Advancement Guarded Barriers to Reach Goals -- Barriers/Prognosis Comment -- CH[facility] IP DIET RECOMMENDATION 06/16/2019 SLP Diet Recommendations Dysphagia 1 (Puree) solids;Honey thick liquids Liquid Administration via Cup Medication Administration Via alternative means Compensations Slow rate;Effortful swallow Postural Changes --   CHL IP OTHER RECOMMENDATIONS 06/16/2019 Recommended Consults -- Oral Care Recommendations Oral care QID Other Recommendations Order thickener from pharmacy   CHL IP FOLLOW UP  RECOMMENDATIONS 06/11/2019 Follow up Recommendations Inpatient Rehab   CHL IP FREQUENCY AND DURATION 06/16/2019 Speech Therapy Frequency (ACUTE ONLY) min 2x/week Treatment Duration 2 weeks      CHL IP ORAL PHASE 06/16/2019 Oral Phase Impaired Oral - Pudding Teaspoon -- Oral - Pudding Cup -- Oral - Honey Teaspoon Weak lingual manipulation;Right pocketing in lateral sulci;Delayed oral transit;Decreased bolus cohesion;Right anterior bolus loss Oral - Honey Cup Weak lingual manipulation;Right pocketing in lateral sulci;Delayed oral transit;Decreased bolus cohesion;Right anterior bolus loss Oral - Nectar Teaspoon Weak lingual manipulation;Right pocketing in lateral sulci;Delayed oral transit;Decreased bolus cohesion;Right anterior bolus loss Oral - Nectar Cup NT Oral - Nectar Straw Weak lingual manipulation;Right pocketing in lateral sulci;Delayed oral transit;Decreased bolus cohesion;Right anterior bolus loss Oral - Thin Teaspoon Weak lingual manipulation;Right pocketing in lateral sulci;Delayed oral transit;Decreased bolus cohesion;Right anterior bolus loss Oral - Thin Cup -- Oral - Thin Straw Weak lingual manipulation;Right pocketing in lateral sulci;Delayed oral transit;Decreased bolus cohesion;Right anterior bolus loss Oral - Puree Weak lingual manipulation;Right pocketing in lateral sulci;Delayed oral transit;Decreased bolus cohesion Oral - Mech Soft -- Oral - Regular -- Oral - Multi-Consistency -- Oral - Pill -- Oral Phase - Comment --  CHL IP PHARYNGEAL PHASE 06/16/2019 Pharyngeal Phase Impaired Pharyngeal- Pudding Teaspoon -- Pharyngeal -- Pharyngeal- Pudding Cup -- Pharyngeal -- Pharyngeal- Honey Teaspoon Pharyngeal residue - valleculae;Reduced epiglottic inversion;Reduced airway/laryngeal closure;Delayed swallow initiation-vallecula;Penetration/Aspiration during swallow Pharyngeal Material enters airway, remains ABOVE vocal cords then ejected out Pharyngeal- Honey Cup Pharyngeal residue - valleculae;Reduced  epiglottic inversion;Reduced airway/laryngeal closure;Delayed swallow initiation-vallecula;Penetration/Aspiration during swallow Pharyngeal Material enters airway, remains ABOVE vocal cords then ejected out Pharyngeal- Nectar Teaspoon Pharyngeal residue - valleculae;Reduced epiglottic inversion;Reduced airway/laryngeal closure;Penetration/Aspiration during swallow;Delayed swallow initiation-vallecula Pharyngeal Material enters airway, remains ABOVE vocal cords then ejected out;Material enters airway, CONTACTS cords and then ejected out;Material enters airway, passes BELOW cords and not ejected out despite cough attempt by patient Pharyngeal- Nectar Cup Pharyngeal residue - valleculae;Reduced epiglottic inversion;Reduced airway/laryngeal closure;Penetration/Aspiration during swallow;Delayed swallow initiation-vallecula Pharyngeal Material enters airway, remains ABOVE vocal cords then ejected out;Material enters airway, CONTACTS cords and then ejected out;Material enters airway, passes BELOW cords and not ejected out despite cough attempt by patient Pharyngeal- Nectar Straw Pharyngeal residue - valleculae;Reduced epiglottic inversion;Reduced airway/laryngeal closure;Penetration/Aspiration during swallow;Delayed swallow initiation-vallecula Pharyngeal Material enters airway,  passes BELOW cords and not ejected out despite cough attempt by patient Pharyngeal- Thin Teaspoon Pharyngeal residue - valleculae;Reduced epiglottic inversion;Reduced airway/laryngeal closure;Penetration/Aspiration during swallow;Delayed swallow initiation-vallecula Pharyngeal Material enters airway, passes BELOW cords and not ejected out despite cough attempt by patient Pharyngeal- Thin Cup -- Pharyngeal -- Pharyngeal- Thin Straw Pharyngeal residue - valleculae;Reduced epiglottic inversion;Reduced airway/laryngeal closure;Penetration/Aspiration during swallow;Delayed swallow initiation-vallecula Pharyngeal Material enters airway, passes BELOW  cords and not ejected out despite cough attempt by patient Pharyngeal- Puree Pharyngeal residue - valleculae;Reduced epiglottic inversion;Reduced airway/laryngeal closure;Penetration/Aspiration during swallow;Delayed swallow initiation-vallecula Pharyngeal Material enters airway, remains ABOVE vocal cords then ejected out Pharyngeal- Mechanical Soft -- Pharyngeal -- Pharyngeal- Regular -- Pharyngeal -- Pharyngeal- Multi-consistency -- Pharyngeal -- Pharyngeal- Pill -- Pharyngeal -- Pharyngeal Comment --  No flowsheet data found. [name] 06/16/2019, 3:41 PM              DG Swallowing Func-Speech Pathology  Result Date: 06/09/2019 Objective Swallowing Evaluation: Type of Study: MBS-Modified Barium Swallow Study  Patient Details Name: William Mann MRN: [id number] Date of Birth: 01/30/1930 Today's Date: 06/09/2019 Time: SLP Start Time (ACUTE ONLY): 1300 -SLP Stop Time (ACUTE ONLY): 1330 SLP Time Calculation (min) (ACUTE ONLY): 30 min Past Medical History: Past Medical History: Diagnosis Date . Aortic atherosclerosis ([place])  . Bilateral hearing loss  . BPH (benign prostatic hyperplasia)  . Cancer ([place])   basal cell carcinoma . Carotid artery occlusion  . Constipation  . Coronary artery disease  . COVID-19  . DVT (deep venous thrombosis) ([place])  . Emphysema lung ([place])  . Fatty liver  . GERD (gastroesophageal reflux disease)  . History of echocardiogram   Echo 4/17: Vigorous LVEF, EF 65-70%, normal wall motion, grade 1 diastolic dysfunction, mildly dilated ascending aorta (40 mm) . Hyperlipidemia  . Hypertension  . Insomnia  . PVD (peripheral vascular disease) ([place])  . Thyroid nodule  Past Surgical History: Past Surgical History: Procedure Laterality Date . APPENDECTOMY   . CAROTID ENDARTERECTOMY  03-05-09  right CEA . COLONOSCOPY  Oct. 2013  one polyp . HERNIA REPAIR   . LIPOMA EXCISION   . PACEMAKER IMPLANT N/A 12/23/2018  Procedure: PACEMAKER IMPLANT;  Surgeon: Evans Lance, MD;  Location: [facility];  Service: Cardiovascular;  Laterality: N/A; HPI: Malakhi Kibbey is a 86 y.o. male with medical history significant of thyroid nodule, emphysema, GERD, right carotid endarterectomy,PVD, hypertension, hyperlipidemia, lower extremity DVT, bilateral hearing loss, presented with new onset of slurred speech and facial droop. Per chart wife also noticed significant right leg weakness, right-sided facial droop, and cough/choking after eating drink since last night. MRI shows small acute infarcts in the left basal ganglia/corona radiata and right thalamus. Pt with increasing lethargy prompting repeat MRI on 4/27 - this revealed extension of the left basal ganglia/radiating white matter tract infarction which is larger than on the initial presentation, measuring up to 2 cm in size presently.  Subjective: alert Assessment / Plan / Recommendation CHL IP CLINICAL IMPRESSIONS 06/09/2019 Clinical Impression Pt continues to present with a moderate-severe oropharyngeal dysphagia,  Oral phase is marked by poor control/cohesion with right sulci residual, anterior spill and delayed and inefficient transfer. Propulsion into pharynx was weak and incomplete, with portion of bolus remaining in oral cavity.  Mobility of larynx was limited, and there was minimal movement of epiglottis into horizontal and incomplete inversion over larynx.  Incomplete laryngeal vestibule closure led to immediate aspiration of teaspoon boluses of thin liquids, evoking a cough response.  Nectar, honey, and pureed barium tended to pool in valleculae and pyriforms with reduced transfer through UES due to impaired pharyngeal squeeze.  It is difficult to discern any meaningful improvement in swallow function.  There remains significant oral and pharyngeal residue after the swallow, and all these materials, even the thickest, are at risk of being aspirated after the swallow with accumulation of boluses. Recommend continuing NPO status for now; pt  currently has a cortrak for nutrition.  I anticipate recovery of swallow function could take quite some time.  SLP will continue to follow for therapeutic POs (nectar or honey thick liquids).  Continue conservative offering of ice chips (after oral care) to mobilize musculature and help maintain health of oral mucosa.  SLP Visit Diagnosis Dysphagia, oropharyngeal phase (R13.12) Attention and concentration deficit following -- Frontal lobe and executive function deficit following -- Impact on safety and function Severe aspiration risk;Moderate aspiration risk   CHL IP TREATMENT RECOMMENDATION 06/09/2019 Treatment Recommendations Therapy as outlined in treatment plan below   Prognosis 06/09/2019 Prognosis for Safe Diet Advancement Guarded Barriers to Reach Goals -- Barriers/Prognosis Comment -- CHL IP DIET RECOMMENDATION 06/09/2019 SLP Diet Recommendations NPO;Ice chips PRN after oral care Liquid Administration via -- Medication Administration -- Compensations -- Postural Changes --   CHL IP OTHER RECOMMENDATIONS 06/09/2019 Recommended Consults -- Oral Care Recommendations Oral care QID Other Recommendations --   CHL IP FOLLOW UP RECOMMENDATIONS 06/09/2019 Follow up Recommendations Inpatient Rehab   CHL IP FREQUENCY AND DURATION 06/09/2019 Speech Therapy Frequency (ACUTE ONLY) min 2x/week Treatment Duration --      CHL IP ORAL PHASE 06/09/2019 Oral Phase Impaired Oral - Pudding Teaspoon -- Oral - Pudding Cup -- Oral - Honey Teaspoon Weak lingual manipulation;Lingual pumping;Reduced posterior propulsion;Holding of bolus;Right pocketing in lateral sulci;Lingual/palatal residue;Piecemeal swallowing;Delayed oral transit;Decreased bolus cohesion Oral - Honey Cup NT Oral - Nectar Teaspoon Weak lingual manipulation;Lingual pumping;Reduced posterior propulsion;Holding of bolus;Right pocketing in lateral sulci;Lingual/palatal residue;Piecemeal swallowing;Delayed oral transit;Decreased bolus cohesion Oral - Nectar Cup NT Oral - Nectar Straw  -- Oral - Thin Teaspoon Weak lingual manipulation;Lingual pumping;Reduced posterior propulsion;Holding of bolus;Right pocketing in lateral sulci;Lingual/palatal residue;Piecemeal swallowing;Delayed oral transit;Decreased bolus cohesion Oral - Thin Cup -- Oral - Thin Straw -- Oral - Puree Weak lingual manipulation;Lingual pumping;Reduced posterior propulsion;Holding of bolus;Right pocketing in lateral sulci;Lingual/palatal residue;Piecemeal swallowing;Delayed oral transit;Decreased bolus cohesion Oral - Mech Soft -- Oral - Regular -- Oral - Multi-Consistency -- Oral - Pill -- Oral Phase - Comment --  CHL IP PHARYNGEAL PHASE 06/09/2019 Pharyngeal Phase Impaired Pharyngeal- Pudding Teaspoon -- Pharyngeal -- Pharyngeal- Pudding Cup -- Pharyngeal -- Pharyngeal- Honey Teaspoon Delayed swallow initiation-pyriform sinuses;Reduced pharyngeal peristalsis;Reduced epiglottic inversion;Reduced anterior laryngeal mobility;Reduced laryngeal elevation;Reduced tongue base retraction;Pharyngeal residue - valleculae;Pharyngeal residue - pyriform Pharyngeal -- Pharyngeal- Honey Cup NT Pharyngeal -- Pharyngeal- Nectar Teaspoon Delayed swallow initiation-pyriform sinuses;Reduced pharyngeal peristalsis;Reduced epiglottic inversion;Reduced anterior laryngeal mobility;Reduced laryngeal elevation;Reduced tongue base retraction;Pharyngeal residue - valleculae;Pharyngeal residue - pyriform Pharyngeal -- Pharyngeal- Nectar Cup NT Pharyngeal -- Pharyngeal- Nectar Straw -- Pharyngeal -- Pharyngeal- Thin Teaspoon Delayed swallow initiation-pyriform sinuses;Reduced pharyngeal peristalsis;Reduced epiglottic inversion;Reduced anterior laryngeal mobility;Reduced laryngeal elevation;Reduced tongue base retraction;Pharyngeal residue - valleculae;Pharyngeal residue - pyriform;Penetration/Aspiration during swallow;Penetration/Aspiration before swallow;Trace aspiration Pharyngeal Material enters airway, passes BELOW cords without attempt by patient to eject  out (silent aspiration);Material enters airway, passes BELOW cords and not ejected out despite cough attempt by patient Pharyngeal- Thin Cup -- Pharyngeal -- Pharyngeal- Thin Straw -- Pharyngeal -- Pharyngeal- Puree Delayed swallow initiation-pyriform sinuses;Reduced pharyngeal peristalsis;Reduced epiglottic  inversion;Reduced anterior laryngeal mobility;Reduced laryngeal elevation;Reduced tongue base retraction;Pharyngeal residue - valleculae;Pharyngeal residue - pyriform;Delayed swallow initiation-vallecula Pharyngeal -- Pharyngeal- Mechanical Soft -- Pharyngeal -- Pharyngeal- Regular -- Pharyngeal -- Pharyngeal- Multi-consistency -- Pharyngeal -- Pharyngeal- Pill -- Pharyngeal -- Pharyngeal Comment --  No flowsheet data found. [name] 06/09/2019, 4:39 PM              DG Swallowing Func-Speech Pathology  Result Date: 05/30/2019 Objective Swallowing Evaluation: Type of Study: MBS-Modified Barium Swallow Study  Patient Details Name: William Mann MRN: [id number] Date of Birth: November 17, 1929 Today's Date: 05/30/2019 Time: SLP Start Time (ACUTE ONLY): [id number] -SLP Stop Time (ACUTE ONLY): 0853 SLP Time Calculation (min) (ACUTE ONLY): 20 min Past Medical History: Past Medical History: Diagnosis Date . Aortic atherosclerosis ([place])  . Bilateral hearing loss  . BPH (benign prostatic hyperplasia)  . Cancer ([place])   basal cell carcinoma . Carotid artery occlusion  . Constipation  . Coronary artery disease  . DVT (deep venous thrombosis) ([place])  . Emphysema lung ([place])  . Fatty liver  . GERD (gastroesophageal reflux disease)  . History of echocardiogram   Echo 4/17: Vigorous LVEF, EF 65-70%, normal wall motion, grade 1 diastolic dysfunction, mildly dilated ascending aorta (40 mm) . Hyperlipidemia  . Hypertension  . Insomnia  . PVD (peripheral vascular disease) ([place])  . Thyroid nodule  Past Surgical History: Past Surgical History: Procedure Laterality Date . APPENDECTOMY   . CAROTID ENDARTERECTOMY  03-05-09  right  CEA . COLONOSCOPY  Oct. 2013  one polyp . HERNIA REPAIR   . LIPOMA EXCISION   . PACEMAKER IMPLANT N/A 12/23/2018  Procedure: PACEMAKER IMPLANT;  Surgeon: Evans Lance, MD;  Location: [facility];  Service: Cardiovascular;  Laterality: N/A; HPI: Asahd Leisinger is a 86 y.o. male with medical history significant of thyroid nodule, emphysema, GERD, right carotid endarterectomy,PVD, hypertension, hyperlipidemia, lower extremity DVT, bilateral hearing loss, presented with new onset of slurred speech and facial droop. Per chart wife also noticed significant right leg weakness, right-sided facial droop, and cough/choking after eating drink since last night. MRI shows small acute infarcts in the left basal ganglia/corona radiata and right thalamus. CXR 4/21 No acute cardiopulmonary disease  No data recorded Assessment / Plan / Recommendation CHL IP CLINICAL IMPRESSIONS 05/30/2019 Clinical Impression Pt was awake, able to follow simple commands however confusion is increased and question delirium. Significant xerstomia due to mouth breathing and NPO status and provided oral cavity moisture. He demonstrated moderate-severe oropharyngeal dysphagia secondary to neuro impairments with confusion exacerbating. Oral phase lacked adequate control, cohesion, transit with right sulci residual, anterior spill and delayed and ineffcient transfer. His laryngeal elevation, anterior hyoid excursion was decreased and larygneal vestibule patentcy during swallow on onto vocal cords with nectar and thin barium (inconsistent and unreliable sensation). Nectar was penetrated prior to laryngeal closure given decreased oral control. Barium unable to completely clear pharynx given weakness with resultant max residue in valleculae. Pt should not consume solids/liquids presently and recommend short term alternate means nutrition. Prognosis to return to modified po's (at minumum) appears fair-good as delirium subsides and cognition  improved for repeat MBS when appropriate.      SLP Visit Diagnosis Dysphagia, oropharyngeal phase (R13.12) Attention and concentration deficit following -- Frontal lobe and executive function deficit following -- Impact on safety and function Severe aspiration risk;Moderate aspiration risk   CHL IP TREATMENT RECOMMENDATION 05/30/2019 Treatment Recommendations Therapy as outlined in treatment plan below   Prognosis 05/30/2019 Prognosis for  Safe Diet Advancement (No Data) Barriers to Reach Goals -- Barriers/Prognosis Comment -- CHL IP DIET RECOMMENDATION 05/30/2019 SLP Diet Recommendations NPO;Ice chips PRN after oral care Liquid Administration via -- Medication Administration Via alternative means Compensations -- Postural Changes --   CHL IP OTHER RECOMMENDATIONS 05/30/2019 Recommended Consults -- Oral Care Recommendations Oral care QID Other Recommendations --   CHL IP FOLLOW UP RECOMMENDATIONS 05/30/2019 Follow up Recommendations Skilled Nursing facility   [facility] IP FREQUENCY AND DURATION 05/30/2019 Speech Therapy Frequency (ACUTE ONLY) min 2x/week Treatment Duration 2 weeks      CHL IP ORAL PHASE 05/30/2019 Oral Phase Impaired Oral - Pudding Teaspoon -- Oral - Pudding Cup -- Oral - Honey Teaspoon Weak lingual manipulation;Reduced posterior propulsion;Right pocketing in lateral sulci;Lingual/palatal residue;Delayed oral transit;Decreased bolus cohesion;Right anterior bolus loss Oral - Honey Cup Weak lingual manipulation;Reduced posterior propulsion;Right pocketing in lateral sulci;Lingual/palatal residue;Delayed oral transit;Decreased bolus cohesion;Right anterior bolus loss Oral - Nectar Teaspoon Weak lingual manipulation;Reduced posterior propulsion;Right pocketing in lateral sulci;Lingual/palatal residue;Delayed oral transit;Decreased bolus cohesion;Right anterior bolus loss Oral - Nectar Cup Weak lingual manipulation;Reduced posterior propulsion;Right pocketing in lateral sulci;Lingual/palatal residue;Delayed oral  transit;Decreased bolus cohesion;Right anterior bolus loss Oral - Nectar Straw -- Oral - Thin Teaspoon Weak lingual manipulation;Reduced posterior propulsion;Right pocketing in lateral sulci;Lingual/palatal residue;Delayed oral transit;Decreased bolus cohesion;Right anterior bolus loss Oral - Thin Cup -- Oral - Thin Straw -- Oral - Puree Weak lingual manipulation;Reduced posterior propulsion;Right pocketing in lateral sulci;Lingual/palatal residue;Delayed oral transit;Decreased bolus cohesion Oral - Mech Soft -- Oral - Regular -- Oral - Multi-Consistency -- Oral - Pill -- Oral Phase - Comment --  CHL IP PHARYNGEAL PHASE 05/30/2019 Pharyngeal Phase Impaired Pharyngeal- Pudding Teaspoon -- Pharyngeal -- Pharyngeal- Pudding Cup -- Pharyngeal -- Pharyngeal- Honey Teaspoon Delayed swallow initiation-vallecula;Pharyngeal residue - valleculae;Reduced anterior laryngeal mobility;Reduced laryngeal elevation Pharyngeal -- Pharyngeal- Honey Cup Pharyngeal residue - valleculae;Reduced laryngeal elevation;Reduced anterior laryngeal mobility Pharyngeal -- Pharyngeal- Nectar Teaspoon Reduced laryngeal elevation;Reduced anterior laryngeal mobility Pharyngeal -- Pharyngeal- Nectar Cup Penetration/Aspiration before swallow;Reduced anterior laryngeal mobility;Reduced laryngeal elevation;Pharyngeal residue - valleculae Pharyngeal Material enters airway, CONTACTS cords and not ejected out Pharyngeal- Nectar Straw -- Pharyngeal -- Pharyngeal- Thin Teaspoon Penetration/Aspiration during swallow;Reduced laryngeal elevation;Reduced anterior laryngeal mobility;Pharyngeal residue - valleculae Pharyngeal Material enters airway, CONTACTS cords and not ejected out Pharyngeal- Thin Cup -- Pharyngeal -- Pharyngeal- Thin Straw -- Pharyngeal -- Pharyngeal- Puree Pharyngeal residue - valleculae;Reduced anterior laryngeal mobility;Reduced laryngeal elevation Pharyngeal -- Pharyngeal- Mechanical Soft -- Pharyngeal -- Pharyngeal- Regular -- Pharyngeal --  Pharyngeal- Multi-consistency -- Pharyngeal -- Pharyngeal- Pill -- Pharyngeal -- Pharyngeal Comment --  No flowsheet data found. Houston Siren 05/30/2019, 10:59 AM Orbie Pyo Colvin Caroli.Ed Actor Pager [phone] Office [phone]              EEG adult  Result Date: 06/03/2019 Lora Havens, MD     06/03/2019  8:15 AM Patient Name: Hosia Cardella MRN: [id number] Epilepsy Attending: Lora Havens Referring Physician/Provider: Dr. Berle Mull Date: 06/02/2019 Duration: 24.58 minutes Patient history: 86 year old male with acute stroke and continued waxing and waning mentation.  EEG to evaluate for seizures. Level of alertness: Awake, asleep AEDs during EEG study: None Technical aspects: This EEG study was done with scalp electrodes positioned according to the 10-20 International system of electrode placement. Electrical activity was acquired at a sampling rate of 500Hz  and reviewed with a high frequency filter of 70Hz  and a low frequency filter of 1Hz . EEG data were recorded continuously and digitally stored.  Description: During awake state, no clear posterior dominant was seen.  Sleep was characterized by sleep spindles (12 to 14 Hz), maximal frontocentral region.  EEG showed continuous generalized polymorphic 3 to 6 Hz theta-delta slowing. Hyperventilation and photic stimulation were not performed. Abnormality -Continuous slow, generalized IMPRESSION: This study is suggestive of moderate diffuse encephalopathy, nonspecific etiology. No seizures or epileptiform discharges were seen throughout the recording. Lora Havens   ECHOCARDIOGRAM COMPLETE  Result Date: 05/29/2019    ECHOCARDIOGRAM REPORT   Patient Name:   JABALI EVERAGE Kincheloe Date of Exam: 05/29/2019 Medical Rec #:  [id number]           Height:       68.0 in Accession #:    [id number]          Weight:       150.0 lb Date of Birth:  [birth year]           BSA:          1.809 m Patient Age:    68 years             BP:           158/99 mmHg Patient Gender: M                   HR:           83 bpm. Exam Location:  Inpatient Procedure: 2D Echo, Cardiac Doppler, Color Doppler and Intracardiac            Opacification Agent Indications:    Stroke 434.91/I163.9  History:        Patient has prior history of Echocardiogram examinations, most                 recent 12/21/2018. Pacemaker. Heart block.  Sonographer:    Clayton Lefort RDCS (AE) Referring Phys: [id number] Lequita Halt  Sonographer Comments: Technically difficult study due to poor echo windows, Technically challenging study due to limited acoustic windows, suboptimal parasternal window, suboptimal apical window and no subcostal window. Limited patient mobility. IMPRESSIONS  1. Left ventricular ejection fraction, by estimation, is 55%. The left ventricle has normal function. The left ventricle has no regional wall motion abnormalities. Left ventricular diastolic parameters are consistent with Grade I diastolic dysfunction (impaired relaxation).  2. Right ventricular systolic function is normal. The right ventricular size is normal. Tricuspid regurgitation signal is inadequate for assessing PA pressure.  3. Left atrial size was mildly dilated.  4. The mitral valve is normal in structure. No evidence of mitral valve regurgitation. No evidence of mitral stenosis.  5. The aortic valve is tricuspid. Aortic valve regurgitation is not visualized. Mild aortic valve sclerosis is present, with no evidence of aortic valve stenosis.  6. Aortic dilatation noted. There is mild dilatation of the ascending aorta measuring 41 mm.  7. Technically difficult study with poor acoustic windows. FINDINGS  Left Ventricle: Left ventricular ejection fraction, by estimation, is 55%. The left ventricle has normal function. The left ventricle has no regional wall motion abnormalities. Definity contrast agent was given IV to delineate the left ventricular endocardial borders. The left ventricular internal  cavity size was normal in size. There is no left ventricular hypertrophy. Left ventricular diastolic parameters are consistent with Grade I diastolic dysfunction (impaired relaxation). Right Ventricle: The right ventricular size is normal. No increase in right ventricular wall thickness. Right ventricular systolic function is normal. Tricuspid regurgitation signal is inadequate for assessing PA pressure. Left Atrium: Left atrial  size was mildly dilated. Right Atrium: Right atrial size was normal in size. Pericardium: There is no evidence of pericardial effusion. Mitral Valve: The mitral valve is normal in structure. Moderate mitral annular calcification. No evidence of mitral valve regurgitation. No evidence of mitral valve stenosis. Tricuspid Valve: The tricuspid valve is normal in structure. Tricuspid valve regurgitation is not demonstrated. Aortic Valve: The aortic valve is tricuspid. Aortic valve regurgitation is not visualized. Mild aortic valve sclerosis is present, with no evidence of aortic valve stenosis. Aortic valve mean gradient measures 4.0 mmHg. Aortic valve peak gradient measures 7.2 mmHg. Aortic valve area, by VTI measures 2.07 cm. Pulmonic Valve: The pulmonic valve was normal in structure. Pulmonic valve regurgitation is not visualized. Aorta: Aortic dilatation noted. There is mild dilatation of the ascending aorta measuring 41 mm. Venous: The inferior vena cava was not well visualized. IAS/Shunts: No atrial level shunt detected by color flow Doppler. Additional Comments: A pacer wire is visualized in the right ventricle.  LEFT VENTRICLE PLAX 2D LVIDd:         4.29 cm LVIDs:         3.11 cm LV PW:         1.00 cm LV IVS:        1.07 cm LVOT diam:     1.80 cm LV SV:         50 LV SV Index:   28 LVOT Area:     2.54 cm  RIGHT VENTRICLE RV S prime:     11.30 cm/s TAPSE (M-mode): 1.9 cm LEFT ATRIUM           Index LA diam:      3.70 cm 2.05 cm/m LA Vol (A2C): 22.9 ml 12.66 ml/m LA Vol (A4C): 50.9 ml  28.14 ml/m  AORTIC VALVE AV Area (Vmax):    2.16 cm AV Area (Vmean):   1.99 cm AV Area (VTI):     2.07 cm AV Vmax:           134.00 cm/s AV Vmean:          94.400 cm/s AV VTI:            0.242 m AV Peak Grad:      7.2 mmHg AV Mean Grad:      4.0 mmHg LVOT Vmax:         114.00 cm/s LVOT Vmean:        73.900 cm/s LVOT VTI:          0.197 m LVOT/AV VTI ratio: 0.81  AORTA Ao Root diam: 3.60 cm Ao Asc diam:  4.10 cm MITRAL VALVE MV Area (PHT): 4.80 cm     SHUNTS MV Decel Time: 158 msec     Systemic VTI:  0.20 m MV E velocity: 71.70 cm/s   Systemic Diam: 1.80 cm MV A velocity: 157.00 cm/s MV E/A ratio:  0.46 Loralie Champagne MD Electronically signed by Loralie Champagne MD Signature Date/Time: 05/29/2019/5:20:01 PM    Final    VAS [place] LOWER EXTREMITY VENOUS (DVT)  Result Date: 05/29/2019  Lower Venous DVTStudy Indications: Stroke.  Risk Factors: DVT. Anticoagulation: Heparin. Comparison Study: 07/18/2018 - Right chronic DVT CFV, FV, PopV Performing Technologist: Oliver Hum RVT  Examination Guidelines: A complete evaluation includes B-mode imaging, spectral Doppler, color Doppler, and power Doppler as needed of all accessible portions of each vessel. Bilateral testing is considered an integral part of a complete examination. Limited examinations for reoccurring indications may be performed as noted. The  reflux portion of the exam is performed with the patient in reverse Trendelenburg.  +---------+---------------+---------+-----------+----------+-----------------+ RIGHT    CompressibilityPhasicitySpontaneityPropertiesThrombus Aging    +---------+---------------+---------+-----------+----------+-----------------+ CFV      Full           Yes      Yes                                    +---------+---------------+---------+-----------+----------+-----------------+ SFJ      Full                                                            +---------+---------------+---------+-----------+----------+-----------------+ FV Prox  Partial        Yes      Yes                  Chronic           +---------+---------------+---------+-----------+----------+-----------------+ FV Mid   Partial        Yes      Yes                  Chronic           +---------+---------------+---------+-----------+----------+-----------------+ FV DistalPartial        Yes      Yes                  Chronic           +---------+---------------+---------+-----------+----------+-----------------+ PFV      Full                                                           +---------+---------------+---------+-----------+----------+-----------------+ POP      Full           No       No                                     +---------+---------------+---------+-----------+----------+-----------------+ PTV      Full                                                           +---------+---------------+---------+-----------+----------+-----------------+ PERO     Full                                                           +---------+---------------+---------+-----------+----------+-----------------+ Gastroc  None                                         Age Indeterminate +---------+---------------+---------+-----------+----------+-----------------+   +---------+---------------+---------+-----------+----------+--------------+  LEFT     CompressibilityPhasicitySpontaneityPropertiesThrombus Aging +---------+---------------+---------+-----------+----------+--------------+ CFV      Full           Yes      Yes                                 +---------+---------------+---------+-----------+----------+--------------+ SFJ      Full                                                        +---------+---------------+---------+-----------+----------+--------------+ FV Prox  Full                                                         +---------+---------------+---------+-----------+----------+--------------+ FV Mid   Full                                                        +---------+---------------+---------+-----------+----------+--------------+ FV DistalFull                                                        +---------+---------------+---------+-----------+----------+--------------+ PFV      Full                                                        +---------+---------------+---------+-----------+----------+--------------+ POP      Full           Yes      Yes                                 +---------+---------------+---------+-----------+----------+--------------+ PTV      Full                                                        +---------+---------------+---------+-----------+----------+--------------+ PERO     Full                                                        +---------+---------------+---------+-----------+----------+--------------+     Summary: RIGHT: - Findings consistent with age indeterminate deep vein thrombosis involving the right gastrocnemius veins. - Findings consistent with chronic deep vein thrombosis involving the right femoral vein. - No cystic structure found in the popliteal fossa.  LEFT: - There is  no evidence of deep vein thrombosis in the lower extremity.  - No cystic structure found in the popliteal fossa.  *See table(s) above for measurements and observations. Electronically signed by Monica Martinez MD on 05/29/2019 at 8:07:48 PM.    Final     Microbiology: Recent Results (from the past 240 hour(s))  Culture, blood (routine x 2)     Status: None   Collection Time: 06/13/19 12:25 PM   Specimen: BLOOD  Result Value Ref Range Status   Specimen Description BLOOD RIGHT ANTECUBITAL  Final   Special Requests   Final    BOTTLES DRAWN AEROBIC AND ANAEROBIC Blood Culture adequate volume   Culture   Final    NO GROWTH 5 DAYS Performed at [facility], [address], [place], [place] [address]    Report Status 06/18/2019 FINAL  Final  Culture, blood (routine x 2)     Status: None   Collection Time: 06/13/19 12:30 PM   Specimen: BLOOD  Result Value Ref Range Status   Specimen Description BLOOD LEFT ANTECUBITAL  Final   Special Requests   Final    BOTTLES DRAWN AEROBIC AND ANAEROBIC Blood Culture adequate volume   Culture   Final    NO GROWTH 5 DAYS Performed at [facility], [place] [address], [place], [place] [address]    Report Status 06/18/2019 FINAL  Final  Culture, Urine     Status: None   Collection Time: 06/13/19  1:35 PM   Specimen: Urine, Clean Catch  Result Value Ref Range Status   Specimen Description URINE, CLEAN CATCH  Final   Special Requests NONE  Final   Culture   Final    NO GROWTH Performed at [facility], [place] [address], [place], [place] [address]    Report Status 06/14/2019 FINAL  Final  SARS CORONAVIRUS 2 (TAT 6-24 HRS) Nasopharyngeal Nasopharyngeal Swab     Status: None   Collection Time: 06/21/19  2:22 PM   Specimen: Nasopharyngeal Swab  Result Value Ref Range Status   SARS Coronavirus 2 NEGATIVE NEGATIVE Final    Comment: (NOTE) SARS-CoV-2 target nucleic acids are NOT DETECTED. The SARS-CoV-2 RNA is generally detectable in upper and lower respiratory specimens during the acute phase of infection. Negative results do not preclude SARS-CoV-2 infection, do not rule out co-infections with other pathogens, and should not be used as the sole basis for treatment or other patient management decisions. Negative results must be combined with clinical observations, patient history, and epidemiological information. The expected result is Negative. Fact Sheet for Patients: SugarRoll.be Fact Sheet for Healthcare Providers: https://www.woods-mathews.com/ This test is not yet approved or cleared by the [place] FDA and  has been authorized for  detection and/or diagnosis of SARS-CoV-2 by FDA under an Emergency Use Authorization (EUA). This EUA will remain  in effect (meaning this test can be used) for the duration of the COVID-19 declaration under Section 56 4(b)(1) of the Act, 21 U.S.C. section 360bbb-3(b)(1), unless the authorization is terminated or revoked sooner. Performed at [facility], [place] [address] [place], [place] [address]      Labs: Basic Metabolic Panel: Recent Labs  Lab 06/17/19 0601 06/21/19 0422  NA 139 141  K 4.0 3.9  CL 105 108  CO2 27 28  GLUCOSE 96 84  BUN 14 16  CREATININE 0.76 0.90  CALCIUM 8.0* 8.1*  MG 2.1 1.9  PHOS 3.0  --    Liver Function Tests:  No results for input(s): AST, ALT, ALKPHOS, BILITOT, PROT, ALBUMIN in the last 168 hours. No results for input(s): LIPASE, AMYLASE in the last 168 hours. No results for input(s): AMMONIA in the last 168 hours. CBC: Recent Labs  Lab 06/17/19 0601 06/18/19 0236 06/19/19 0706 06/20/19 0255 06/21/19 0422  WBC 9.0 10.9* 8.1 7.2 6.6  NEUTROABS 7.1 8.9* 5.9 5.2 4.5  HGB 13.5 14.1 14.0 13.6 12.7*  HCT 40.7 41.8 41.8 40.9 39.1  MCV 102.5* 101.5* 103.7* 103.0* 104.8*  PLT 160 166 183 171 190   Cardiac Enzymes: No results for input(s): CKTOTAL, CKMB, CKMBINDEX, TROPONINI in the last 168 hours. BNP: BNP (last 3 results) No results for input(s): BNP in the last 8760 hours.  ProBNP (last 3 results) No results for input(s): PROBNP in the last 8760 hours.  CBG: Recent Labs  Lab 06/22/19 1539 06/22/19 1941 06/22/19 2352 06/23/19 0343 06/23/19 0800  GLUCAP 115* 119* 89 86 75       Signed:  Kayleen Memos, MD [facility] 06/23/2019, 9:24 AM

## 2019-06-23 NOTE — Procedures (Signed)
Patient Name: William Mann  MRN: [id number]  Epilepsy Attending: Lora Havens  Referring Physician/Provider: Dr Irene Pap Date: 06/23/2019 Duration: 25.47 mins  Patient history: 86yo M male with acute stroke and continued altered waning mentation.  EEG to evaluate for seizures.  Level of alertness: Awake  AEDs during EEG study: None  Technical aspects: This EEG study was done with scalp electrodes positioned according to the 10-20 International system of electrode placement. Electrical activity was acquired at a sampling rate of 500Hz  and reviewed with a high frequency filter of 70Hz  and a low frequency filter of 1Hz . EEG data were recorded continuously and digitally stored.  Description: No clear posterior dominant rhythm was seen. EEG showed generalized polymorphic 5-9Hz  theta-alpha slowing. Intermittent left temporal 2-3Hz  delta slowing was also seen.  Hyperventilation and photic stimulation were not performed.     ABNORMALITY -Intermittent slow, left temporal region -Continuous slow, generalized  IMPRESSION: This study is suggestive of non specific left temporal dysfunction as well as mild diffuse encephalopathy, nonspecific etiology. No seizures or epileptiform discharges were seen throughout the recording.  Davari Lopes Barbra Sarks

## 2019-06-23 NOTE — TOC Transition Note (Cosign Needed)
Transition of Care [facility]) - CM/SW Discharge Note   Patient Details  Name: William Mann MRN: [id number] Date of Birth: 1929-10-21  Transition of Care [facility]) CM/SW Contact:  Kirstie Peri, [place] Work Phone Number: 06/23/2019, 12:41 PM   Clinical Narrative:    Nurse to call report to [phone] Rm# [id number]   Final next level of care: [place] Barriers to Discharge: Barriers Resolved   Patient Goals and CMS Choice Patient states their goals for this hospitalization and ongoing recovery are:: Pt family still wants CIR, but agreeable to SNF if unable to accept. CMS Medicare.gov Compare Post Acute Care list provided to:: Patient Represenative (must comment)(spouse) Choice offered to / list presented to : Spouse  Discharge Placement              Patient chooses bed at: [facility] Patient to be transferred to facility by: [place] Name of family member notified: Marlow Baars Patient and family notified of of transfer: 06/23/19  Discharge Plan and Services                                     Social Determinants of Health (SDOH) Interventions     Readmission Risk Interventions No flowsheet data found.

## 2019-06-23 NOTE — Progress Notes (Signed)
Leukocytosis and neutrophilia noted on CBC w diff.  Lactic acid ok. Procalcitonin, UA/Urine culture pending.    BP is soft but maintaining MAP> 65.  Continue gentle IV fluid hydration NS at 75 cc/hr and continue to closely monitor vital signs.    T-max 101.3 with tachycardia.  Meets SIRS with no clear source of infection.  Will obtain blood cultures x 2 peripherally, obtain MRSA screening and cover empirically with IV vancomycin and Zosyn.  Will discontinue antibiotics once infective process has been ruled out.    Acute encephalopathy of unclear etiology: CT head wo contrast, EEG ordered.  Will continue to closely monitor and treat as indicated.  Updated the patient's spouse and son in person at bedside.

## 2019-06-23 NOTE — Progress Notes (Signed)
Pharmacy Antibiotic Note  William Mann is a 86 y.o. male admitted on 05/28/2019 with sepsis.  Pharmacy was consulted on 5/7 for vanc/cefepime dosing.  Pt presented on 4/21 for slurred speech and weakness. He was found to have a CVA.   Pt was supposed to be dced to SNF today before he developed fevers and leukocytosis. CXR did not show any issue. Vanc and zosyn were ordered empirically to r/o sepsis. D/w [place] and we will change zosyn to cefepime instead to avoid AKI with vanc.   Scr 0.84 CrCl ~10ml/min  Goal vanc trough 10-15  Plan: Vanc 1.5g IV x1 then 1.25g IV q24 Cefepime 2g IV q12 hours VancomycinTrough as needed   Weight: 79.8 kg (176 lb)  Temp (24hrs), Avg:99.5 F (37.5 C), Min:97.6 F (36.4 C), Max:101.5 F (38.6 C)  Recent Labs  Lab 06/17/19 0601 06/17/19 0601 06/18/19 0236 06/19/19 0706 06/20/19 0255 06/21/19 0422 06/23/19 1243  WBC 9.0   < > 10.9* 8.1 7.2 6.6 22.6*  CREATININE 0.76  --   --   --   --  0.90 0.84  LATICACIDVEN  --   --   --   --   --   --  1.7   < > = values in this interval not displayed.    Estimated Creatinine Clearance: 58.8 mL/min (by C-G formula based on SCr of 0.84 mg/dL).    Allergies  Allergen Reactions  . Atorvastatin Other (See Comments)    Liver inflammation    . Rosuvastatin Other (See Comments)    Muscle pain  . Viagra [Sildenafil] Other (See Comments)    Reaction ??    Antimicrobials this admission: 5/1 amox>>5/5 5/7 vanc>>5/11 5/17>> 5/7 cefepime>>5/11 5/17>> 5/7 flagyl>>5/11  Dose adjustments this admission:   Microbiology results: 4/26 blood>>negative  4/27 urine>>aerococcus urinae 5/6 urine>>negative  5/7 blood>> neg 5/17 blood>> 5/17 urine>>  Onnie Boer, PharmD, [place], AAHIVP, CPP Infectious Disease Pharmacist 06/23/2019 4:23 PM

## 2019-06-23 NOTE — Progress Notes (Signed)
SLP Cancellation Note  Patient Details Name: William Mann MRN: [id number] DOB: Jan 02, 1930   Cancelled treatment:       Reason Eval/Treat Not Completed: (pt working with MD at this time, will continue efforts)  Kathleen Lime, MS [facility] SLP Acute Rehab Services Office [phone]  William Mann 06/23/2019, 2:59 PM

## 2019-06-23 NOTE — Progress Notes (Signed)
STAT EEG completed; results pending. Dr Yadav notified. 

## 2019-06-24 LAB — CBC WITH DIFFERENTIAL/PLATELET
Abs Immature Granulocytes: 0.2 10*3/uL — ABNORMAL HIGH (ref 0.00–0.07)
Basophils Absolute: 0.1 10*3/uL (ref 0.0–0.1)
Basophils Relative: 0 %
Eosinophils Absolute: 0 10*3/uL (ref 0.0–0.5)
Eosinophils Relative: 0 %
HCT: 43.5 % (ref 39.0–52.0)
Hemoglobin: 14.5 g/dL (ref 13.0–17.0)
Immature Granulocytes: 1 %
Lymphocytes Relative: 4 %
Lymphs Abs: 1 10*3/uL (ref 0.7–4.0)
MCH: 34.8 pg — ABNORMAL HIGH (ref 26.0–34.0)
MCHC: 33.3 g/dL (ref 30.0–36.0)
MCV: 104.3 fL — ABNORMAL HIGH (ref 80.0–100.0)
Monocytes Absolute: 1.2 10*3/uL — ABNORMAL HIGH (ref 0.1–1.0)
Monocytes Relative: 5 %
Neutro Abs: 24.4 10*3/uL — ABNORMAL HIGH (ref 1.7–7.7)
Neutrophils Relative %: 90 %
Platelets: 233 10*3/uL (ref 150–400)
RBC: 4.17 MIL/uL — ABNORMAL LOW (ref 4.22–5.81)
RDW: 14.2 % (ref 11.5–15.5)
WBC: 26.8 10*3/uL — ABNORMAL HIGH (ref 4.0–10.5)
nRBC: 0 % (ref 0.0–0.2)

## 2019-06-24 LAB — COMPREHENSIVE METABOLIC PANEL
ALT: 45 U/L — ABNORMAL HIGH (ref 0–44)
AST: 32 U/L (ref 15–41)
Albumin: 2 g/dL — ABNORMAL LOW (ref 3.5–5.0)
Alkaline Phosphatase: 85 U/L (ref 38–126)
Anion gap: 8 (ref 5–15)
BUN: 20 mg/dL (ref 8–23)
CO2: 23 mmol/L (ref 22–32)
Calcium: 8.2 mg/dL — ABNORMAL LOW (ref 8.9–10.3)
Chloride: 112 mmol/L — ABNORMAL HIGH (ref 98–111)
Creatinine, Ser: 0.88 mg/dL (ref 0.61–1.24)
GFR calc Af Amer: >60 mL/min (ref >60)
GFR calc non Af Amer: >60 mL/min (ref >60)
Glucose, Bld: 107 mg/dL — ABNORMAL HIGH (ref 70–99)
Potassium: 3.7 mmol/L (ref 3.5–5.1)
Sodium: 143 mmol/L (ref 135–145)
Total Bilirubin: 1.2 mg/dL (ref 0.3–1.2)
Total Protein: 4.7 g/dL — ABNORMAL LOW (ref 6.5–8.1)

## 2019-06-24 LAB — GLUCOSE, CAPILLARY
Glucose-Capillary: 122 mg/dL — ABNORMAL HIGH (ref 70–99)
Glucose-Capillary: 122 mg/dL — ABNORMAL HIGH (ref 70–99)
Glucose-Capillary: 133 mg/dL — ABNORMAL HIGH (ref 70–99)
Glucose-Capillary: 148 mg/dL — ABNORMAL HIGH (ref 70–99)
Glucose-Capillary: 87 mg/dL (ref 70–99)
Glucose-Capillary: 94 mg/dL (ref 70–99)
Glucose-Capillary: 95 mg/dL (ref 70–99)

## 2019-06-24 LAB — AMMONIA: Ammonia: 29 umol/L (ref 9–35)

## 2019-06-24 MED ORDER — SODIUM CHLORIDE 0.9 % IV SOLN: INTRAVENOUS | Status: AC

## 2019-06-24 NOTE — Progress Notes (Signed)
  Speech Language Pathology Treatment: Dysphagia;Cognitive-Linquistic  Patient Details Name: William Mann MRN: [id number] DOB: 1929/11/19 Today's Date: 06/24/2019 Time: [id number] SLP Time Calculation (min) (ACUTE ONLY): 30 min  Assessment / Plan / Recommendation Clinical Impression  Pt seen during lunch for assessment of diet tolerance and education, and for strategies to maximize intelligibility. Pt was observed with pureed textures and honey thick liquids. Anterior leakage noted, with cues beneficial for clearing. Pt consumed <50% of his meal, but ate 90% of the magic cup. Pt exhibits intermittent audible swallow, and frequently clears his throat during the meal. Repeat MBS one week ago indicates pt needs to remain on this conservative diet. Pt's wife was given information on simply thick thickener, as an available option for thickeners if/when pt returns home. At this point per wife, pt is slated to be DC'd to [facility] once bladder infection resolves.   Without cues, pt speech is largely unintelligible, however, given cues to speak slowly and overarticulate, speech is fully intelligible at the word and short phrase level. Minimal carryover, even to the next statement.    HPI HPI: William Mann is a 86 y.o. male with medical history significant of thyroid nodule, emphysema, GERD, right carotid endarterectomy,PVD, hypertension, hyperlipidemia, lower extremity DVT, bilateral hearing loss, presented with new onset of slurred speech and facial droop. Per chart wife also noticed significant right leg weakness, right-sided facial droop, and cough/choking after eating drink since last night. MRI shows small acute infarcts in the left basal ganglia/corona radiata and right thalamus. Pt with increasing lethargy prompting repeat MRI on 4/27 - this revealed extension of the left basal ganglia/radiating white matter tract infarction which is larger than on the initial presentation, measuring up to 2  cm in size presently.       SLP Plan  Continue with current plan of care;Goals updated       Recommendations  Diet recommendations: Dysphagia 1 (puree);Honey-thick liquid Liquids provided via: Teaspoon;Cup Medication Administration: Crushed with puree Supervision: Staff to assist with self feeding;Patient able to self feed Compensations: Minimize environmental distractions;Effortful swallow Postural Changes and/or Swallow Maneuvers: Seated upright 90 degrees;Upright 30-60 min after meal                Oral Care Recommendations: Oral care BID Follow up Recommendations: Skilled Nursing facility;24 hour supervision/assistance SLP Visit Diagnosis: Dysphagia, oropharyngeal phase (R13.12) Plan: Continue with current plan of care;Goals updated       Wallowa B. Quentin Ore, [facility], [place] Speech Language Pathologist Office: [phone] Pager: [phone]  Shonna Chock 06/24/2019, 1:08 PM

## 2019-06-24 NOTE — Progress Notes (Signed)
PROGRESS NOTE  Yovani Macfarland [id number] DOB: [birth year] DOA: 05/28/2019 PCP: Patient, No Pcp Per  HPI/Recap of past 61 hours: 86 year old male with history of hypertension, hyperlipidemia, DVT on Eliquis, permanent pacemaker, carotid artery stenosis status post right carotid endarterectomy, COVID-19 infection in January 2021 [facility] slurred speech andright sidedfacial droop as well as recurrent falls. MRI brain showed small acute infarct in left basal ganglia/corona radiata and right thalamus.   Seen by neurology/stroke team.  Hospital course complicated by fluctuating mental status, worsening speech and right-sided weakness on 4/26 for which a repeat CT head was obtained and showing worsening of his stroke concerning for acute infarction.  EEG was obtained and was negative for seizures.  Repeated MRI done on 06/03/2019 showed extension of the left basal ganglia stroke.  Neuro team felt there was nothing further that could be done, therefore palliative care team was consulted.   Due to dysphagia in the setting of acute CVA, he was started on core track feeding tube. IR was unable to place G-tube. General surgerywasconsulted. Initially planned for G-tube placement on 5/10/2021but was canceled due to improvement of his dysphagia. Coretrack tube was removed on 06/18/19 andhe was started on dysphagia 1(pure), honey thick liquid diet which he has tolerated well.He is followed by speech therapist. Feeding assistance in place but he has been feeding himself, consuming at least 50% of his meals.   On 06/23/19 prior to dc to SNF he had an acute change in his mental status.  He became lethargic.  Work up revealed UTI for which he was started on IV antibiotics empirically.  06/24/19: Seen and examined.  Somnolent but easily arousable to voices.  States he feels better.  Leukocytosis trending up, will continue IV antibiotics and repeat CBC with differentials in  the morning.  We will continue to follow cultures.   Assessment/Plan: Active Problems:   Stroke (cerebrum) (HCC)   CVA (cerebral vascular accident) ([place])   Coronary artery disease involving native coronary artery of native heart without angina pectoris   Deep venous thrombosis (HCC)   Fatty liver   Dementia without behavioral disturbance (HCC)   Tachycardia   Hypertension   Leukocytosis   Dysphagia due to recent cerebral infarction   Hemiplegia as late effect of cerebral infarction [facility])   Advanced care planning/counseling discussion   Goals of care, counseling/discussion   Apraxia due to acute stroke [facility])   Palliative care by specialist   Protein-calorie malnutrition, severe   DNR (do not resuscitate)   Palliative care encounter   Acute bilateral ischemic strokes-left with secondary stroke, right thalamic infarct secondary to large vessel disease. Presented with right facial droop, right hemiplegia and dysphagia with altered mental status.   L BG/CR and R PLIC/thalamic infarcts secondary to small vessel disease   -CT head No acute abnormality. Small vessel disease. Atrophy.  -MRI  Small L basal ganglia / corona radiata and R thalamic infarcts. Small vessel disease. Atrophy.  -Repeated MRI 4/27: Extended BG stroke. -CTA head & neck no LVO. L ICA plaque w/ 50% stenosis. R CEA patent. R>L supraclinoid ICA and B PCA moderate to severe stenoses.  -LE Doppler  R femoral w/ chronic DVT. -2D Echo EF 55%, LA mild dilation -LDL 160, goal LDL less than 70 -HgbA1c 5.0, goal A1c less than 7.0, at goal. -Patient is currently on aspirin 81 mg daily, Eliquis 5 mg twice daily, Zocor 40 mg daily  Acute metabolic encephalopathy likely secondary to Pseudomonas aeruginosa UTI Became very  lethargic on 06/23/2019, work-up negative for worsening stroke or seizures UA positive for pyuria on 06/23/2019, urine culture positive for greater than 100,000 colonies of Pseudomonas aeruginosa, follow  sensitivities He was started on cefepime and IV vancomycin on 06/23/2019.  MRSA screening negative, DC IV vancomycin Blood cultures negative to date, continue to follow cultures  Sepsis secondary to Pseudomonas aeruginosa UTI Leukocytosis is trending up to 26K Management as per above Repeat CBC with differential tomorrow Continue cefepime and continue to follow cultures  Dysphagia-secondary to acute stroke.   Repeat swallow evaluation showed some improvement with speech recs for honey thick liquids. Continue to advance diet as tolerated  Follow speech and dietitian recommendations  Continue aspiration precautions  Treated Aerococcus UTI-urine culture grew more than 100,000 colonies of Aerococcus.  Treated with amoxicillin.  Repeat urine culture on 06/13/2019 showed no growth.  Treated Aspiration pneumonia-patient developed leukocytosis on 06/12/2019, chest x-ray consistent with pneumonia.  Patient started on vancomycin, cefepime, IV Flagyl.  Blood cultures negative to date.  Afebrile with no leukocytosis.  Procalcitonin<0.10. Dc abx 5/11. Continue aspirations precautions.  Ambulatory dysfunction post acute stroke Continue PT OT with assistance and fall precautions PT recommended SNF with 24 hours supervision/assistance  Moderate protein calorie malnutrition Albumin 2.2 BMI 26 with evidence of muscle mass loss Continue multivitamins Continue oral supplements Magic cup 3 times daily with meals     DVT prophylaxis: Eliquis   Code Status: DNR  Family Communication: Updated family at bedside on 5/17, his son and his spouse    Status is: Inpatient  Dispo: The patient is from: Home.               Anticipated d/c is to: SNF               Anticipated d/c date is: 06/27/19              Barrier to discharge: Medically unstable with leukocytosis, ongoing treatment for UTI on IV antibiotics.                 Objective: Vitals:   06/24/19 0339 06/24/19 0500 06/24/19 0753  06/24/19 1227  BP: 104/77  123/90 121/78  Pulse: (!) 112  92 93  Resp: 20  15 19   Temp: 98.3 F (36.8 C)  97.6 F (36.4 C) 98 F (36.7 C)  TempSrc: Oral  Oral Oral  SpO2: 96%  98% 98%  Weight:  77.6 kg      Intake/Output Summary (Last 24 hours) at 06/24/2019 1520 Last data filed at 06/24/2019 1030 Gross per 24 hour  Intake 1033.13 ml  Output 800 ml  Net 233.13 ml   Filed Weights   06/22/19 0410 06/23/19 0400 06/24/19 0500  Weight: 81.2 kg 79.8 kg 77.6 kg    Exam: . General: 86 y.o. year-old male well-developed well-nourished.  Somnolent but easily arousable to voices.   . Cardiovascular: Regular rate and rhythm no rubs or gallops. Marland Kitchen Respiratory: Clear to auscultation no wheezes or rales. . Abdomen: Soft nontender normal bowel sounds present. . Musculoskeletal: No lower extremity edema bilaterally.   Marland Kitchen Psychiatry: Mood is appropriate for condition and setting.   Data Reviewed: CBC: Recent Labs  Lab 06/19/19 0706 06/20/19 0255 06/21/19 0422 06/23/19 1243 06/24/19 0256  WBC 8.1 7.2 6.6 22.6* 26.8*  NEUTROABS 5.9 5.2 4.5 21.0* 24.4*  HGB 14.0 13.6 12.7* 15.6 14.5  HCT 41.8 40.9 39.1 46.8 43.5  MCV 103.7* 103.0* 104.8* 104.5* 104.3*  PLT 183 171 190 239 233  Basic Metabolic Panel: Recent Labs  Lab 06/21/19 0422 06/23/19 1243 06/24/19 0256  NA 141 141 143  K 3.9 4.1 3.7  CL 108 106 112*  CO2 28 25 23   GLUCOSE 84 104* 107*  BUN 16 17 20   CREATININE 0.90 0.84 0.88  CALCIUM 8.1* 8.4* 8.2*  MG 1.9 1.9  --   PHOS  --  3.2  --    GFR: Estimated Creatinine Clearance: 56.1 mL/min (by C-G formula based on SCr of 0.88 mg/dL). Liver Function Tests: Recent Labs  Lab 06/23/19 1243 06/24/19 0256  AST 51* 32  ALT 57* 45*  ALKPHOS 104 85  BILITOT 1.1 1.2  PROT 5.5* 4.7*  ALBUMIN 2.4* 2.0*   No results for input(s): LIPASE, AMYLASE in the last 168 hours. Recent Labs  Lab 06/24/19 0256  AMMONIA 29   Coagulation Profile: No results for input(s): INR,  PROTIME in the last 168 hours. Cardiac Enzymes: No results for input(s): CKTOTAL, CKMB, CKMBINDEX, TROPONINI in the last 168 hours. BNP (last 3 results) No results for input(s): PROBNP in the last 8760 hours. HbA1C: No results for input(s): HGBA1C in the last 72 hours. CBG: Recent Labs  Lab 06/23/19 2323 06/24/19 0339 06/24/19 0603 06/24/19 0756 06/24/19 1146  GLUCAP 127* 122* 95 94 148*   Lipid Profile: No results for input(s): CHOL, HDL, LDLCALC, TRIG, CHOLHDL, LDLDIRECT in the last 72 hours. Thyroid Function Tests: No results for input(s): TSH, T4TOTAL, FREET4, T3FREE, THYROIDAB in the last 72 hours. Anemia Panel: No results for input(s): VITAMINB12, FOLATE, FERRITIN, TIBC, IRON, RETICCTPCT in the last 72 hours. Urine analysis:    Component Value Date/Time   COLORURINE YELLOW 06/23/2019 1614   APPEARANCEUR CLOUDY (A) 06/23/2019 1614   LABSPEC 1.018 06/23/2019 1614   PHURINE 6.0 06/23/2019 1614   GLUCOSEU NEGATIVE 06/23/2019 1614   HGBUR MODERATE (A) 06/23/2019 1614   BILIRUBINUR NEGATIVE 06/23/2019 1614   KETONESUR NEGATIVE 06/23/2019 1614   PROTEINUR NEGATIVE 06/23/2019 1614   UROBILINOGEN 1.0 10/08/2014 0214   NITRITE POSITIVE (A) 06/23/2019 1614   LEUKOCYTESUR LARGE (A) 06/23/2019 1614   Sepsis Labs: @LABRCNTIP (procalcitonin:4,lacticidven:4)  ) Recent Results (from the past 240 hour(s))  SARS CORONAVIRUS 2 (TAT 6-24 HRS) Nasopharyngeal Nasopharyngeal Swab     Status: None   Collection Time: 06/21/19  2:22 PM   Specimen: Nasopharyngeal Swab  Result Value Ref Range Status   SARS Coronavirus 2 NEGATIVE NEGATIVE Final    Comment: (NOTE) SARS-CoV-2 target nucleic acids are NOT DETECTED. The SARS-CoV-2 RNA is generally detectable in upper and lower respiratory specimens during the acute phase of infection. Negative results do not preclude SARS-CoV-2 infection, do not rule out co-infections with other pathogens, and should not be used as the sole basis for  treatment or other patient management decisions. Negative results must be combined with clinical observations, patient history, and epidemiological information. The expected result is Negative. Fact Sheet for Patients: SugarRoll.be Fact Sheet for Healthcare Providers: https://www.woods-mathews.com/ This test is not yet approved or cleared by the [place] FDA and  has been authorized for detection and/or diagnosis of SARS-CoV-2 by FDA under an Emergency Use Authorization (EUA). This EUA will remain  in effect (meaning this test can be used) for the duration of the COVID-19 declaration under Section 56 4(b)(1) of the Act, 21 U.S.C. section 360bbb-3(b)(1), unless the authorization is terminated or revoked sooner. Performed at [facility], [place] [address], [place], [place] [address]   Culture, Urine     Status: Abnormal (  Preliminary result)   Collection Time: 06/23/19  2:35 PM   Specimen: Urine, Random  Result Value Ref Range Status   Specimen Description URINE, RANDOM  Final   Special Requests   Final    NONE Performed at [facility], [address], [place], [place] [address]    Culture >=100,000 COLONIES/mL PSEUDOMONAS AERUGINOSA (A)  Final   Report Status PENDING  Incomplete  MRSA PCR Screening     Status: None   Collection Time: 06/23/19  4:14 PM   Specimen: Nasopharyngeal  Result Value Ref Range Status   MRSA by PCR NEGATIVE NEGATIVE Final    Comment:        The GeneXpert MRSA Assay (FDA approved for NASAL specimens only), is one component of a comprehensive MRSA colonization surveillance program. It is not intended to diagnose MRSA infection nor to guide or monitor treatment for MRSA infections. Performed at [facility], [place] [address], [place], [place] [address]   Culture, blood (routine x 2)     Status: None (Preliminary result)   Collection Time: 06/23/19  4:24 PM   Specimen: BLOOD LEFT ARM  Result  Value Ref Range Status   Specimen Description BLOOD LEFT ARM  Final   Special Requests   Final    BOTTLES DRAWN AEROBIC AND ANAEROBIC Blood Culture adequate volume   Culture   Final    NO GROWTH < 24 HOURS Performed at [facility], [place] [address], [place], [place] [address]    Report Status PENDING  Incomplete  Culture, blood (routine x 2)     Status: None (Preliminary result)   Collection Time: 06/23/19  4:25 PM   Specimen: BLOOD RIGHT HAND  Result Value Ref Range Status   Specimen Description BLOOD RIGHT HAND  Final   Special Requests   Final    BOTTLES DRAWN AEROBIC AND ANAEROBIC Blood Culture adequate volume   Culture   Final    NO GROWTH < 24 HOURS Performed at [facility], [place] [address] [place],  [address]    Report Status PENDING  Incomplete      Studies: EEG  Result Date: 06/23/2019 Lora Havens, MD     06/23/2019  6:52 PM Patient Name: Alger Aston MRN: [id number] Epilepsy Attending: Lora Havens Referring Physician/Provider: Dr Irene Pap Date: 06/23/2019 Duration: 25.47 mins Patient history: 86yo M male with acute stroke and continued altered waning mentation.  EEG to evaluate for seizures. Level of alertness: Awake AEDs during EEG study: None Technical aspects: This EEG study was done with scalp electrodes positioned according to the 10-20 International system of electrode placement. Electrical activity was acquired at a sampling rate of 500Hz  and reviewed with a high frequency filter of 70Hz  and a low frequency filter of 1Hz . EEG data were recorded continuously and digitally stored. Description: No clear posterior dominant rhythm was seen. EEG showed generalized polymorphic 5-9Hz  theta-alpha slowing. Intermittent left temporal 2-3Hz  delta slowing was also seen.  Hyperventilation and photic stimulation were not performed.   ABNORMALITY -Intermittent slow, left temporal region -Continuous slow, generalized IMPRESSION: This study is suggestive  of non specific left temporal dysfunction as well as mild diffuse encephalopathy, nonspecific etiology. No seizures or epileptiform discharges were seen throughout the recording. Priyanka Barbra Sarks   CT ABDOMEN PELVIS WO CONTRAST  Result Date: 06/23/2019 CLINICAL DATA:  Abdominal pain, fever EXAM: CT ABDOMEN AND PELVIS WITHOUT CONTRAST TECHNIQUE: Multidetector CT imaging of the abdomen and pelvis  was performed following the standard protocol without IV contrast. COMPARISON:  06/11/2019 FINDINGS: Lower chest: Transvenous pacing leads. Coronary calcifications. Mild dependent atelectasis/consolidation in the visualized lung bases. Trace pleural effusions. Stable small volume pericardial fluid. Hepatobiliary: No focal liver abnormality is seen. No gallstones, gallbladder wall thickening, or biliary dilatation. Pancreas: Unremarkable. No pancreatic ductal dilatation or surrounding inflammatory changes. Spleen: Normal in size without focal abnormality. Adrenals/Urinary Tract: Adrenal glands unremarkable. No border-deforming renal mass. 2 mm calculus in the lower pole right renal collecting system. No hydronephrosis. Small calcified stones in the dependent aspect of the nondistended urinary bladder. Stomach/Bowel: Stomach and small bowel are nondistended. Appendix surgically absent. The colon is nondilated multiple sigmoid diverticula without significant adjacent inflammatory/edematous change or abscess. Vascular/Lymphatic: Moderate calcified atheromatous aortic and iliac plaque without aneurysm. No abdominal or pelvic adenopathy. Reproductive: Prostate enlargement with central coarse calcifications. Other: No ascites. No free air. Musculoskeletal: Interval decrease in size of left posterior deep subcutaneous collection at the L5 level measuring 4.5 cm maximum transverse diameter, previously 6.7. Mild lumbar dextroscoliosis with multilevel spondylitic change. Old non healed the posterior right eleventh rib fracture. No  acute fracture or worrisome bone lesion. IMPRESSION: 1. No acute findings. 2. Interval decrease in size of left flank subcutaneous hematoma. 3. Right nephrolithiasis without hydronephrosis. 4. Sigmoid diverticulosis. Aortic Atherosclerosis (ICD10-I70.0). Electronically Signed   By: Lucrezia Europe M.D.   On: 06/23/2019 16:32   CT HEAD WO CONTRAST  Result Date: 06/23/2019 CLINICAL DATA:  Altered mental status. Acute encephalopathy of unclear etiology. EXAM: CT HEAD WITHOUT CONTRAST TECHNIQUE: Contiguous axial images were obtained from the base of the skull through the vertex without intravenous contrast. COMPARISON:  06/02/2019.  Brain MRI, 06/03/2019. FINDINGS: Brain: Focal hypoattenuation the left basal ganglia consistent with a combination of a chronic lacunar infarct and a subacute infarct, which was noted on brain MRI dated 06/03/2019. No evidence of new infarction. The ventricles are enlarged consistent with moderate diffuse atrophy. Patchy areas of white matter hypoattenuation are noted bilaterally consistent with mild chronic microvascular ischemic change. Ventricle size is stable from the prior head CT. There are no parenchymal masses. No midline shift. There are no extra-axial masses or abnormal fluid collections. No intracranial hemorrhage. Vascular: No hyperdense vessel or unexpected calcification. Skull: Normal. Negative for fracture or focal lesion. Sinuses/Orbits: Globes and orbits are unremarkable. Sinuses and mastoid air cells are clear. Other: None. IMPRESSION: 1. No acute intracranial abnormalities. 2. Left basal ganglia to deep periventricular white matter infarct noted on the brain MRI is similar in appearance to the CT scan dated 06/02/2019. Adjacent chronic lacunar infarct. 3. Atrophy and chronic microvascular ischemic change, stable. Electronically Signed   By: Lajean Manes M.D.   On: 06/23/2019 16:29    Scheduled Meds: . apixaban  5 mg Oral BID  . aspirin EC  81 mg Oral Once per day on  Mon Thu  . chlorhexidine  15 mL Mouth Rinse BID  . doxazosin  1 mg Oral Daily  . mouth rinse  15 mL Mouth Rinse q12n4p  . melatonin  3 mg Oral QHS  . multivitamin with minerals  1 tablet Oral Daily  . senna-docusate  2 tablet Oral BID  . simvastatin  40 mg Oral q1800  . sodium chloride  1 spray Each Nare QID  . sodium chloride flush  10-40 mL Intracatheter Q12H    Continuous Infusions: . sodium chloride 50 mL/hr at 06/24/19 0649  . ceFEPime (MAXIPIME) IV 2 g (06/24/19 0531)  . vancomycin  LOS: 27 days     Kayleen Memos, MD [facility] Pager [phone]  If 7PM-7AM, please contact night-coverage [email or web address] Password [facility] 06/24/2019, 3:20 PM

## 2019-06-24 NOTE — Progress Notes (Signed)
Nutrition Follow-up  DOCUMENTATION CODES:   Severe malnutrition in context of acute illness/injury  INTERVENTION:  Continue MVI daily  Continue Magic cup TID with meals, each supplement provides 290 kcal and 9 grams of protein  NUTRITION DIAGNOSIS:   Severe Malnutrition related to acute illness(CVA) as evidenced by moderate fat depletion, moderate muscle depletion.  Ongoing.  GOAL:   Patient will meet greater than or equal to 90% of their needs  Progressing.  MONITOR:   Labs, Weight trends, TF tolerance, I & O's  REASON FOR ASSESSMENT:   Consult Enteral/tube feeding initiation and management  ASSESSMENT:   Pt with a PMH significant for PVD, HTN, HLD, DVT, PPM implant, carotid stenosis s/p right carotid endarterectomy, COVID-19 02/2019. Pt admitted with bilateral CVA.  4/22 - failed BSE 4/23 - failed MBS, Cortrak placed(tip in stomach) 4/27 - MRI revealed extension of L BG stroke 4/30 - Cortrak replaced (tip in stomach) 5/03 - failed MBS 5/10 - MBS, recommended honey thick liquids 5/12 - Dysphagia 1 diet with Honey thick liquids ordered; Cortrak removed  Pt's son reports pt had a poor appetite yesterday due to a UTI and fever, but states the pt's appetite is much better today. Pt's son also reports pt really liking the Magic Cup supplements.   PO Intake: 40-75% x last 8 recorded meals (53% average meal intake)  Labs: CBGs 122-95-94 Medications reviewed and include: MVI, Senokot  Diet Order:   Diet Order            DIET - DYS 1 Room service appropriate? No; Fluid consistency: Honey Thick  Diet effective now              EDUCATION NEEDS:   No education needs have been identified at this time  Skin:  Skin Assessment: Reviewed RN Assessment  Last BM:  5/17  Height:   Ht Readings from Last 1 Encounters:  05/27/19 5\' 8"  (1.727 m)    Weight:   Wt Readings from Last 1 Encounters:  06/24/19 77.6 kg    BMI:  Body mass index is 26  kg/m.  Estimated Nutritional Needs:   Kcal:  1900-2100  Protein:  95-110 grams  Fluid:  >1.9L/d   Larkin Ina, MS, RD, LDN RD pager number and weekend/on-call pager number located in [place].

## 2019-06-24 NOTE — TOC Progression Note (Signed)
Transition of Care [facility]) - Progression Note    Patient Details  Name: Lorenso Ellman MRN: [id number] Date of Birth: 1930-01-20  Transition of Care [facility]) CM/SW [place], [place] Work Phone Number: 06/24/2019, 10:24 AM  Clinical Narrative:    MSW Intern spoke with Loree Fee from [place], who got a concerned call from the pt's wife. She stated that she was told that due to his delay in discharge, they no longer could take him. [facility] noted this was not true and would still be able to take him. MSW Intern went to speak with family and assure them. Pt's son stated that the dr. Michela Pitcher possible discharge on Thursday. MSW Intern will continue to follow.   Expected Discharge Plan: [place] Barriers to Discharge: Barriers Resolved  Expected Discharge Plan and Services Expected Discharge Plan: [place] arrangements for the past 2 months: Single Family Home Expected Discharge Date: 06/23/19                                     Social Determinants of Health (SDOH) Interventions    Readmission Risk Interventions No flowsheet data found.

## 2019-06-24 NOTE — Progress Notes (Signed)
Assumed care of patient at 1530

## 2019-06-24 NOTE — Progress Notes (Signed)
Physical Therapy Treatment Patient Details Name: William Mann MRN: [id number] DOB: 1929/07/28 Today's Date: 06/24/2019    History of Present Illness [name] is a 86 y.o. male with history of  R ICA stenosis s/p R CEA, PVD, hypertension, hyperlipidemia, lower extremity DVT on Eliquis, carotid artery occlusion, atherosclerosis and bilateral hearing loss presenting with dysarthria, facial droop, R leg weakness.  Found to have L BG/CR and R PLIC/thalamic infarcts. Repeat CT 4/26 and MRI 4/27 revealed extension of the left basal ganglia/radiating white matter tract infarction.    PT Comments    Patient seen for mobility progression. Pt able to stand multiple times before stand pivot transfer OOB. Pt continues to present with R side weakness and lateral bias and requires +2 assist for OOB mobility. Continue to progress as tolerated with anticipated d/c to SNF for further skilled PT services.     Follow Up Recommendations  Supervision/Assistance - 24 hour;SNF     Equipment Recommendations  Other (comment)(defer to next venue)    Recommendations for Other Services Rehab consult     Precautions / Restrictions Precautions Precautions: Fall Restrictions Weight Bearing Restrictions: No    Mobility  Bed Mobility Overal bed mobility: Needs Assistance Bed Mobility: Sidelying to Sit;Rolling   Sidelying to sit: HOB elevated;Mod assist;+2 for safety/equipment;+2 for physical assistance Supine to sit: Mod assist;+2 for safety/equipment;HOB elevated   Sit to sidelying: +2 for physical assistance;+2 for safety/equipment;Mod assist;Max assist General bed mobility comments: multimodal cues for sequencing  Transfers Overall transfer level: Needs assistance Equipment used: 2 person hand held assist Transfers: Sit to/from Stand;Stand Pivot Transfers Sit to Stand: +2 physical assistance;Mod assist Stand pivot transfers: +2 physical assistance;Max assist       General  transfer comment: assist to power up into standing and to maintain standing balance due to R side weakness and lateral bias; assist for weight shifting, balance, and pivoting feet for transfer  Ambulation/Gait                 Stairs             Wheelchair Mobility    Modified Rankin (Stroke Patients Only) Modified Rankin (Stroke Patients Only) Pre-Morbid Rankin Score: Moderate disability Modified Rankin: Moderately severe disability     Balance Overall balance assessment: Needs assistance Sitting-balance support: Feet supported;Single extremity supported Sitting balance-Leahy Scale: Poor Sitting balance - Comments: Tactile cues in order to engage trunk and faciliation at R scapula Postural control: Right lateral lean Standing balance support: Bilateral upper extremity supported Standing balance-Leahy Scale: Poor Standing balance comment: external assist to maintain standing                            Cognition Arousal/Alertness: Awake/alert;Lethargic Behavior During Therapy: WFL for tasks assessed/performed;Flat affect Overall Cognitive Status: Impaired/Different from baseline Area of Impairment: Following commands;Problem solving;Safety/judgement;Attention                   Current Attention Level: Sustained   Following Commands: Follows one step commands with increased time Safety/Judgement: Decreased awareness of deficits;Decreased awareness of safety Awareness: Intellectual Problem Solving: Slow processing;Requires verbal cues;Requires tactile cues;Decreased initiation;Difficulty sequencing General Comments: Pt with increased lethargy at beginning of session, however was able to participate once at EOB, pt continues to require tactile cues to engage trunk and manipulate RLE,      Exercises      General Comments        Pertinent  Vitals/Pain Pain Assessment: Faces Faces Pain Scale: No hurt    Home Living                       Prior Function            PT Goals (current goals can now be found in the care plan section) Acute Rehab PT Goals Patient Stated Goal: to go to rehab Progress towards PT goals: Progressing toward goals    Frequency    Min 3X/week      PT Plan Current plan remains appropriate    Co-evaluation PT/OT/SLP Co-Evaluation/Treatment: Yes Reason for Co-Treatment: For patient/therapist safety;To address functional/ADL transfers PT goals addressed during session: Mobility/safety with mobility;Balance OT goals addressed during session: ADL's and self-care;Strengthening/ROM      AM-PAC PT "6 Clicks" Mobility   Outcome Measure  Help needed turning from your back to your side while in a flat bed without using bedrails?: A Lot Help needed moving from lying on your back to sitting on the side of a flat bed without using bedrails?: A Lot Help needed moving to and from a bed to a chair (including a wheelchair)?: A Lot Help needed standing up from a chair using your arms (e.g., wheelchair or bedside chair)?: A Lot Help needed to walk in hospital room?: Total Help needed climbing 3-5 steps with a railing? : Total 6 Click Score: 10    End of Session Equipment Utilized During Treatment: Gait belt Activity Tolerance: Patient tolerated treatment well Patient left: in chair;with call bell/phone within reach;with chair alarm set;with family/visitor present Nurse Communication: Mobility status PT Visit Diagnosis: Other abnormalities of gait and mobility (R26.89);Muscle weakness (generalized) (M62.81);Other symptoms and signs involving the nervous system (R29.898)     Time: [id number] PT Time Calculation (min) (ACUTE ONLY): 32 min  Charges:  $Therapeutic Activity: 8-22 mins                     Earney Navy, PTA Acute Rehabilitation Services Pager: [phone] Office: [phone]     Darliss Cheney 06/24/2019, 5:31 PM

## 2019-06-24 NOTE — Progress Notes (Signed)
Occupational Therapy Treatment Patient Details Name: William Mann MRN: [id number] DOB: 1929/11/13 Today's Date: 06/24/2019    History of present illness [name] is a 86 y.o. male with history of  R ICA stenosis s/p R CEA, PVD, hypertension, hyperlipidemia, lower extremity DVT on Eliquis, carotid artery occlusion, atherosclerosis and bilateral hearing loss presenting with dysarthria, facial droop, R leg weakness.  Found to have L BG/CR and R PLIC/thalamic infarcts. Repeat CT 4/26 and MRI 4/27 revealed extension of the left basal ganglia/radiating white matter tract infarction.   OT comments  Patient continues to make steady progress towards goals in skilled OT session. Patient's session encompassed co-treat with PT in order to focus on NDT techniques and increased trials of sit<>stand transfers in hopes to progress with ambulation. Pt remains lethargic at the beginning of sessions, requiring increased assist to facilitate trunk and dependent on tactile cues at EOB to prevent flexed kyphotic posture. Pt remains dependent on external support of 2 in order to engage in standing fully upright, and unable to establish appropriate base of support without external assist to RLE. Discharge remains appropriate; will continue to follow acutely.    Follow Up Recommendations  SNF    Equipment Recommendations  Other (comment)(TBD)    Recommendations for Other Services      Precautions / Restrictions Precautions Precautions: Fall Restrictions Weight Bearing Restrictions: No       Mobility Bed Mobility Overal bed mobility: Needs Assistance     Sidelying to sit: HOB elevated;Mod assist;+2 for safety/equipment;+2 for physical assistance     Sit to sidelying: +2 for physical assistance;+2 for safety/equipment;Mod assist;Max assist General bed mobility comments: Continues to require assist to manipulate hips to side of bed, however with tactile cues was able to advance legs 80%  off of EOB, reliant on external support at trunk to engage in sitting  Transfers Overall transfer level: Needs assistance Equipment used: 2 person hand held assist Transfers: Sit to/from [employer] Sit to Stand: Mod assist;+2 physical assistance Stand pivot transfers: +2 physical assistance;Mod assist       General transfer comment: Attempted sit<>stand x3 in order to progress towards ambulation, unable to advance legs without assist and required significant extra time to establish appropriate base of support (reliant on +2 assist) stand pivot completed to transfer to chair (mod +2)    Balance Overall balance assessment: Needs assistance Sitting-balance support: Feet supported;Single extremity supported Sitting balance-Leahy Scale: Poor Sitting balance - Comments: Tactile cues in order to engage trunk and faciliation at R scapula Postural control: Right lateral lean Standing balance support: Bilateral upper extremity supported Standing balance-Leahy Scale: Poor Standing balance comment: external assist to maintain standing                           ADL either performed or assessed with clinical judgement   ADL Overall ADL's : Needs assistance/impaired                         Toilet Transfer: Moderate assistance;Maximal assistance;+2 for safety/equipment;+2 for physical assistance Toilet Transfer Details (indicate cue type and reason): Simulated with recliner, complete sit<>stands x3 in order to progress towards ambulation, then stand pivot to recliner         Functional mobility during ADLs: Moderate assistance;Maximal assistance;+2 for physical assistance;+2 for safety/equipment General ADL Comments: Session focused on sit<>stand transfers, weight shifting, and attempts to advance and take a step  Vision       Perception     Praxis      Cognition Arousal/Alertness: Awake/alert;Lethargic Behavior During Therapy: WFL for tasks  assessed/performed;Flat affect Overall Cognitive Status: Impaired/Different from baseline Area of Impairment: Following commands;Problem solving;Safety/judgement;Attention;Awareness                   Current Attention Level: Sustained   Following Commands: Follows one step commands with increased time Safety/Judgement: Decreased awareness of deficits;Decreased awareness of safety Awareness: Intellectual Problem Solving: Slow processing;Requires verbal cues;Requires tactile cues;Decreased initiation;Difficulty sequencing General Comments: Pt with increased lethargy at beginning of session, however was able to participate once at EOB, pt continues to require tactile cues to engage trunk and manipulate RLE,        Exercises     Shoulder Instructions       General Comments      Pertinent Vitals/ Pain       Pain Assessment: Faces Faces Pain Scale: No hurt  Home Living                                          Prior Functioning/Environment              Frequency  Min 2X/week        Progress Toward Goals  OT Goals(current goals can now be found in the care plan section)  Progress towards OT goals: Progressing toward goals  Acute Rehab OT Goals Patient Stated Goal: to go to rehab OT Goal Formulation: With patient/family Time For Goal Achievement: 07/04/19 Potential to Achieve Goals: Fair  Plan Discharge plan remains appropriate    Co-evaluation    PT/OT/SLP Co-Evaluation/Treatment: Yes Reason for Co-Treatment: Complexity of the patient's impairments (multi-system involvement);Necessary to address cognition/behavior during functional activity;To address functional/ADL transfers;For patient/therapist safety   OT goals addressed during session: ADL's and self-care;Strengthening/ROM      AM-PAC OT "6 Clicks" Daily Activity     Outcome Measure   Help from another person eating meals?: A Lot Help from another person taking care of  personal grooming?: A Lot Help from another person toileting, which includes using toliet, bedpan, or urinal?: Total Help from another person bathing (including washing, rinsing, drying)?: A Lot Help from another person to put on and taking off regular upper body clothing?: A Lot Help from another person to put on and taking off regular lower body clothing?: Total 6 Click Score: 10    End of Session Equipment Utilized During Treatment: Gait belt  OT Visit Diagnosis: Unsteadiness on feet (R26.81);Muscle weakness (generalized) (M62.81);Other symptoms and signs involving cognitive function   Activity Tolerance Patient limited by fatigue   Patient Left in chair;with call bell/phone within reach;with chair alarm set   Nurse Communication Mobility status        Time: 1610-9604 OT Time Calculation (min): 32 min  Charges: OT General Charges $OT Visit: 1 Visit OT Treatments $Self Care/Home Management : 8-22 mins  Pollyann Glen E. Arie Gable, COTA/L Acute Rehabilitation Services [phone]   Cherlyn Cushing 06/24/2019, 2:32 PM

## 2019-06-25 DIAGNOSIS — B965 Pseudomonas (aeruginosa) (mallei) (pseudomallei) as the cause of diseases classified elsewhere: Secondary | ICD-10-CM | POA: Diagnosis not present

## 2019-06-25 DIAGNOSIS — Z86718 Personal history of other venous thrombosis and embolism: Secondary | ICD-10-CM | POA: Diagnosis not present

## 2019-06-25 DIAGNOSIS — I69359 Hemiplegia and hemiparesis following cerebral infarction affecting unspecified side: Secondary | ICD-10-CM | POA: Diagnosis not present

## 2019-06-25 DIAGNOSIS — M2550 Pain in unspecified joint: Secondary | ICD-10-CM | POA: Diagnosis not present

## 2019-06-25 DIAGNOSIS — Z8616 Personal history of COVID-19: Secondary | ICD-10-CM | POA: Diagnosis not present

## 2019-06-25 DIAGNOSIS — Z950 Presence of cardiac pacemaker: Secondary | ICD-10-CM | POA: Diagnosis not present

## 2019-06-25 DIAGNOSIS — I639 Cerebral infarction, unspecified: Secondary | ICD-10-CM | POA: Diagnosis not present

## 2019-06-25 DIAGNOSIS — R278 Other lack of coordination: Secondary | ICD-10-CM | POA: Diagnosis not present

## 2019-06-25 DIAGNOSIS — N400 Enlarged prostate without lower urinary tract symptoms: Secondary | ICD-10-CM | POA: Diagnosis not present

## 2019-06-25 DIAGNOSIS — I739 Peripheral vascular disease, unspecified: Secondary | ICD-10-CM | POA: Diagnosis not present

## 2019-06-25 DIAGNOSIS — R41841 Cognitive communication deficit: Secondary | ICD-10-CM | POA: Diagnosis not present

## 2019-06-25 DIAGNOSIS — G4700 Insomnia, unspecified: Secondary | ICD-10-CM | POA: Diagnosis not present

## 2019-06-25 DIAGNOSIS — I10 Essential (primary) hypertension: Secondary | ICD-10-CM | POA: Diagnosis not present

## 2019-06-25 DIAGNOSIS — I2510 Atherosclerotic heart disease of native coronary artery without angina pectoris: Secondary | ICD-10-CM | POA: Diagnosis not present

## 2019-06-25 DIAGNOSIS — N390 Urinary tract infection, site not specified: Secondary | ICD-10-CM | POA: Diagnosis not present

## 2019-06-25 DIAGNOSIS — E440 Moderate protein-calorie malnutrition: Secondary | ICD-10-CM | POA: Diagnosis not present

## 2019-06-25 DIAGNOSIS — I442 Atrioventricular block, complete: Secondary | ICD-10-CM | POA: Diagnosis not present

## 2019-06-25 DIAGNOSIS — I69390 Apraxia following cerebral infarction: Secondary | ICD-10-CM | POA: Diagnosis not present

## 2019-06-25 DIAGNOSIS — F0390 Unspecified dementia without behavioral disturbance: Secondary | ICD-10-CM | POA: Diagnosis not present

## 2019-06-25 DIAGNOSIS — J439 Emphysema, unspecified: Secondary | ICD-10-CM | POA: Diagnosis not present

## 2019-06-25 DIAGNOSIS — Z9181 History of falling: Secondary | ICD-10-CM | POA: Diagnosis not present

## 2019-06-25 DIAGNOSIS — R4182 Altered mental status, unspecified: Secondary | ICD-10-CM | POA: Diagnosis not present

## 2019-06-25 DIAGNOSIS — Z23 Encounter for immunization: Secondary | ICD-10-CM | POA: Diagnosis not present

## 2019-06-25 DIAGNOSIS — Z7401 Bed confinement status: Secondary | ICD-10-CM | POA: Diagnosis not present

## 2019-06-25 DIAGNOSIS — E785 Hyperlipidemia, unspecified: Secondary | ICD-10-CM | POA: Diagnosis not present

## 2019-06-25 DIAGNOSIS — M6281 Muscle weakness (generalized): Secondary | ICD-10-CM | POA: Diagnosis not present

## 2019-06-25 DIAGNOSIS — R1312 Dysphagia, oropharyngeal phase: Secondary | ICD-10-CM | POA: Diagnosis not present

## 2019-06-25 DIAGNOSIS — I69391 Dysphagia following cerebral infarction: Secondary | ICD-10-CM | POA: Diagnosis not present

## 2019-06-25 DIAGNOSIS — R2689 Other abnormalities of gait and mobility: Secondary | ICD-10-CM | POA: Diagnosis not present

## 2019-06-25 DIAGNOSIS — N3000 Acute cystitis without hematuria: Secondary | ICD-10-CM | POA: Diagnosis not present

## 2019-06-25 DIAGNOSIS — R4189 Other symptoms and signs involving cognitive functions and awareness: Secondary | ICD-10-CM | POA: Diagnosis not present

## 2019-06-25 DIAGNOSIS — Z7901 Long term (current) use of anticoagulants: Secondary | ICD-10-CM | POA: Diagnosis not present

## 2019-06-25 DIAGNOSIS — R2681 Unsteadiness on feet: Secondary | ICD-10-CM | POA: Diagnosis not present

## 2019-06-25 LAB — URINE CULTURE: Culture: >=100000 — AB

## 2019-06-25 LAB — CBC WITH DIFFERENTIAL/PLATELET
Abs Immature Granulocytes: 0.09 10*3/uL — ABNORMAL HIGH (ref 0.00–0.07)
Basophils Absolute: 0.1 10*3/uL (ref 0.0–0.1)
Basophils Relative: 0 %
Eosinophils Absolute: 0.2 10*3/uL (ref 0.0–0.5)
Eosinophils Relative: 1 %
HCT: 42.5 % (ref 39.0–52.0)
Hemoglobin: 14.1 g/dL (ref 13.0–17.0)
Immature Granulocytes: 1 %
Lymphocytes Relative: 8 %
Lymphs Abs: 1.2 10*3/uL (ref 0.7–4.0)
MCH: 34.8 pg — ABNORMAL HIGH (ref 26.0–34.0)
MCHC: 33.2 g/dL (ref 30.0–36.0)
MCV: 104.9 fL — ABNORMAL HIGH (ref 80.0–100.0)
Monocytes Absolute: 0.8 10*3/uL (ref 0.1–1.0)
Monocytes Relative: 6 %
Neutro Abs: 11.9 10*3/uL — ABNORMAL HIGH (ref 1.7–7.7)
Neutrophils Relative %: 84 %
Platelets: 186 10*3/uL (ref 150–400)
RBC: 4.05 MIL/uL — ABNORMAL LOW (ref 4.22–5.81)
RDW: 14.1 % (ref 11.5–15.5)
WBC: 14.2 10*3/uL — ABNORMAL HIGH (ref 4.0–10.5)
nRBC: 0 % (ref 0.0–0.2)

## 2019-06-25 LAB — GLUCOSE, CAPILLARY
Glucose-Capillary: 103 mg/dL — ABNORMAL HIGH (ref 70–99)
Glucose-Capillary: 74 mg/dL (ref 70–99)
Glucose-Capillary: 88 mg/dL (ref 70–99)
Glucose-Capillary: 81 mg/dL (ref 70–99)

## 2019-06-25 LAB — COMPREHENSIVE METABOLIC PANEL
ALT: 46 U/L — ABNORMAL HIGH (ref 0–44)
AST: 46 U/L — ABNORMAL HIGH (ref 15–41)
Albumin: 2.1 g/dL — ABNORMAL LOW (ref 3.5–5.0)
Alkaline Phosphatase: 85 U/L (ref 38–126)
Anion gap: 8 (ref 5–15)
BUN: 22 mg/dL (ref 8–23)
CO2: 22 mmol/L (ref 22–32)
Calcium: 8 mg/dL — ABNORMAL LOW (ref 8.9–10.3)
Chloride: 112 mmol/L — ABNORMAL HIGH (ref 98–111)
Creatinine, Ser: 0.73 mg/dL (ref 0.61–1.24)
GFR calc Af Amer: >60 mL/min (ref >60)
GFR calc non Af Amer: >60 mL/min (ref >60)
Glucose, Bld: 86 mg/dL (ref 70–99)
Potassium: 3.6 mmol/L (ref 3.5–5.1)
Sodium: 142 mmol/L (ref 135–145)
Total Bilirubin: 0.8 mg/dL (ref 0.3–1.2)
Total Protein: 5.1 g/dL — ABNORMAL LOW (ref 6.5–8.1)

## 2019-06-25 LAB — SARS CORONAVIRUS 2 BY RT PCR (HOSPITAL ORDER, PERFORMED IN ~~LOC~~ HOSPITAL LAB): SARS Coronavirus 2: NEGATIVE

## 2019-06-25 MED ORDER — SACCHAROMYCES BOULARDII 250 MG PO CAPS: 250.0000 mg | ORAL_CAPSULE | Freq: Two times a day (BID) | ORAL | Status: AC

## 2019-06-25 MED ORDER — CIPROFLOXACIN HCL 500 MG PO TABS: 500.0000 mg | ORAL_TABLET | Freq: Two times a day (BID) | ORAL | 0 refills | Status: AC

## 2019-06-25 MED ORDER — SENNOSIDES-DOCUSATE SODIUM 8.6-50 MG PO TABS: 2.0000 | ORAL_TABLET | Freq: Two times a day (BID) | ORAL | Status: AC

## 2019-06-25 MED ORDER — CIPROFLOXACIN HCL 500 MG PO TABS
500.0000 mg | ORAL_TABLET | Freq: Two times a day (BID) | ORAL | Status: DC
  Administered 2019-06-25: 500 mg via ORAL
  Filled 2019-06-25: qty 1

## 2019-06-25 NOTE — TOC Transition Note (Signed)
Transition of Care [facility]) - CM/SW Discharge Note   Patient Details  Name: Yazen Charvat MRN: [id number] Date of Birth: [birth year]  Transition of Care [facility]) CM/SW Contact:  Kirstie Peri, [place] Work Phone Number: 06/25/2019, 12:35 PM   Clinical Narrative:    Nurse to call report to [phone]. Rm# [phone]   Final next level of care: Skilled Nursing Facility Barriers to Discharge: Barriers Resolved   Patient Goals and CMS Choice Patient states their goals for this hospitalization and ongoing recovery are:: Pt family still wants CIR, but agreeable to SNF if unable to accept. CMS Medicare.gov Compare Post Acute Care list provided to:: Patient Represenative (must comment)(spouse) Choice offered to / list presented to : Spouse  Discharge Placement              Patient chooses bed at: [facility] Patient to be transferred to facility by: [place] Name of family member notified: Marlow Baars Patient and family notified of of transfer: 06/25/19  Discharge Plan and Services                                     Social Determinants of Health (SDOH) Interventions     Readmission Risk Interventions No flowsheet data found.

## 2019-06-25 NOTE — Plan of Care (Signed)
  Problem: Education: Goal: Knowledge of disease or condition will improve Outcome: Adequate for Discharge Goal: Knowledge of secondary prevention will improve Outcome: Adequate for Discharge Goal: Knowledge of patient specific risk factors addressed and post discharge goals established will improve Outcome: Adequate for Discharge Goal: Individualized Educational Video(s) Outcome: Not Met (add Reason)  Pt does not have the attention span to watch videos.  Problem: Coping: Goal: Will verbalize positive feelings about self Outcome: Completed/Met Goal: Will identify appropriate support needs Outcome: Completed/Met   Problem: Health Behavior/Discharge Planning: Goal: Ability to manage health-related needs will improve Outcome: Adequate for Discharge   Problem: Self-Care: Goal: Ability to participate in self-care as condition permits will improve Outcome: Adequate for Discharge Goal: Verbalization of feelings and concerns over difficulty with self-care will improve Outcome: Not Met (add Reason) Pt does not verbalize much & can not do much for himself yet but does as much as he can. Problem: Nutrition: Goal: Risk of aspiration will decrease Outcome: Adequate for Discharge Goal: Dietary intake will improve Outcome: Adequate for Discharge   Problem: Ischemic Stroke/TIA Tissue Perfusion: Goal: Complications of ischemic stroke/TIA will be minimized Outcome: Completed/Met   Problem: Education: Goal: Knowledge of General Education information will improve Description: Including pain rating scale, medication(s)/side effects and non-pharmacologic comfort measures Outcome: Adequate for Discharge   Problem: Health Behavior/Discharge Planning: Goal: Ability to manage health-related needs will improve Outcome: Adequate for Discharge   Problem: Clinical Measurements: Goal: Ability to maintain clinical measurements within normal limits will improve Outcome: Adequate for Discharge Goal:  Will remain free from infection Outcome: Adequate for Discharge Goal: Diagnostic test results will improve Outcome: Adequate for Discharge Goal: Respiratory complications will improve Outcome: Adequate for Discharge Goal: Cardiovascular complication will be avoided Outcome: Completed/Met   Goal: Ability to communicate needs accurately will improve Outcome: Adequate for Discharge

## 2019-06-25 NOTE — Discharge Summary (Signed)
Physician Discharge Summary  William Mann [id number] DOB: [birth year] DOA: 05/28/2019  PCP: Patient, No Pcp Per  Admit date: 05/28/2019 Discharge date: 06/25/2019  Admitted From: Home Discharge disposition: SNF   Code Status: DNR  Diet Recommendation: Dysphagia 1 diet with honey thick liquid   Recommendations for Outpatient Follow-Up:   1. Follow-up with PCP as an outpatient 2. Follow-up with neurology as an outpatient 3. Follow-up with speech therapy as an outpatient  Discharge Diagnosis:   Active Problems:   Stroke (cerebrum) (HCC)   CVA (cerebral vascular accident) ([place])   Coronary artery disease involving native coronary artery of native heart without angina pectoris   Deep venous thrombosis (HCC)   Fatty liver   Dementia without behavioral disturbance (HCC)   Tachycardia   Hypertension   Leukocytosis   Dysphagia due to recent cerebral infarction   Hemiplegia as late effect of cerebral infarction [facility])   Advanced care planning/counseling discussion   Goals of care, counseling/discussion   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 male with history of hypertension, hyperlipidemia, DVT on Eliquis, permanent pacemaker, carotidarterystenosis status post right carotid endarterectomy, COVID-19 infection in January 2021. Patient presented to ED on 4/21from homewith slurred speech andright sidedfacial droop as well as recurrent falls.   MRI brain showed small acute infarct in left basal ganglia/corona radiata and right thalamus. He was admitted for stroke work-up.  Hospital course complicated altered mental status,worsening speech and right-sided weaknesson 4/26for whicha repeat CT head was obtained. It showed worsening of infarction. EEG wasobtained  andwas negative for seizures.  Repeat MRI done on 06/03/2019 showed extension of the left basal gangliastroke.  Neuro team felt there was nothing further that couldbe done,therefore palliative care team was consulted.  Patient also had significant dysphagia secondary to acute CVA.  He was started on core track feeding tube. IR was unable toplace G-tube. General surgerywasconsulted for PEG tube placement.  However, dysphagia eventually improved and he did not require PEG tube.  Coretrack tube was removed on 06/18/19.  He was started on dysphagia 1(pure), honey thick liquid dietwhich he has tolerated well. Last a speech therapy evaluation was on 5/18 with recommendation continue on same diet for now. Feeding assistance in place but he has been feeding himself,consuming at least50% of his meals.   On 06/23/19, he was noted to have UTI, altered mental status.  IV cefepime was started after his WBC count improved, mental status improved.  Urine culture grew Pseudomonas.  Switched to oral ciprofloxacin today.   Hospital Course:  Acute bilateral ischemic strokes-left with secondary stroke, right thalamic infarct secondary to large vessel disease. -Presented with right facial droop, right hemiplegia and dysphagia with altered mental status.  -Left basal ganglia/corona radiata and right PLIC/thalamic infarcts secondary to smallvessel disease  -CT head did not show any acute abnormality. -MRI brain showedsmall L basal ganglia / corona radiata and R thalamic infarcts. Small vessel disease. Atrophy. -Repeated MRI 4/27: Extended basal ganglia stroke. -CTA head &neck no LVO. L ICA plaque w/ 50% stenosis. R CEA patent. R>L supraclinoid ICA and B PCA moderate to severe stenoses.  -LE DopplerR femoral w/ chronic DVT. -2D EchoEF 55%, LA mild dilatation -LDL160, goal LDL less than 70 -HgbA1c5.0, goal A1c less than 7.0,at goal. -Patient is currently on aspirin  81 mg daily, Eliquis 5  mg twice daily, Zocor 40 mg daily  Pseudomonas aeruginosa UTI Became very lethargic on 06/23/2019,work-up negative for worsening stroke or seizures UA positive for pyuria on 06/23/2019, urine culture positive for greater than 100,000 colonies of Pseudomonas aeruginosa.  Initially started on IV cefepime.  Switched to oral ciprofloxacin today.  7 more days post discharge with probiotics.   Blood cultures negative to date. -Leukocytosis improving, was 26,000 on 5/17, 14,000 today.  Dysphagia-secondary to acute stroke.  Moderate protein calorie malnutrition Repeat swallow evaluation on 5/18 showed some improvement with speech recs for honey thick liquids. Continue to advance diet as tolerated  Follow speech and dietitian recommendations  Continue aspiration precautions Continue multivitamins Continue oral supplements Magic cup 3 times daily with meals  Ambulatory dysfunction post acute stroke Continue PT OT with assistance and fall precautions PT recommended SNF with 24 hours supervision/assistance  Essential hypertension -Has been started on doxazosin.  Hyperlipidemia -Has been started on statin.  DVT on Eliquis  permanent pacemaker in place,   carotidarterystenosis status post right carotid endarterectomy -on aspirin, Eliquis, statin.   COVID-19 infection in January 2021. -Off isolation.  No residual symptoms.   Code Status:DNR Family Communication:Updated patient's son at bedside today.  Diet Orders (From admission, onward)    Start     Ordered   06/18/19 1443  DIET - DYS 1 Room service appropriate? No; Fluid consistency: Honey Thick  Diet effective now    Comments: Full supervision; allow pt to feed himself when able with left hand. He should be seated with HOB as upright as possible. Crush meds  Question Answer Comment  Room service appropriate? No   Fluid consistency: Honey Thick      06/18/19 1443          Subjective:  Patient was seen and  examined this morning. Pleasant elderly Caucasian male.  Lying on bed.  Not in distress.  Son at bedside.  Patient feels better today.  He is ready to be discharged.  Son agrees with the plan as well.  Chart reviewed. No fever last 24 hours.  Heart rate in the 80s, blood pressure in normal range, breathing comfortably room air. -Labs from this morning reviewed.  WBC count is 14.2 which is much better than 22 yesterday.  Discharge Exam:   Vitals:   06/24/19 1952 06/25/19 0403 06/25/19 0741 06/25/19 1155  BP: 117/72 120/71 136/74 105/62  Pulse: 81 88 80 85  Resp: 18 16 18 16   Temp: 97.6 F (36.4 C) (!) 97.5 F (36.4 C) 97.9 F (36.6 C) 99.3 F (37.4 C)  TempSrc: Axillary Oral Oral Oral  SpO2: 98% 98% 100% 99%  Weight:        Body mass index is 26 kg/m.  General exam: Appears calm and comfortable.  Skin: No rashes, lesions or ulcers. HEENT: Atraumatic, normocephalic, supple neck, no obvious bleeding Lungs: Clear to auscultation bilaterally CVS: Regular rate and rhythm, no murmur GI/Abd soft, nontender, nondistended, bowel sound present CNS: Alert, awake, oriented to place and person, slow to respond about time.  Has dysphagia, dysarthria due to stroke. Psychiatry: Mood appropriate Extremities: No pedal edema, no calf tenderness  Discharge Instructions:  Wound care: None Discharge Instructions    Ambulatory referral to Neurology   Complete by: As directed    Follow up with stroke clinic NP (Jessica Vanschaick or Cecille Rubin, if both not available, consider Zachery Dauer, or Ahern) at [facility] in about 4 weeks. Thanks.  Increase activity slowly   Complete by: As directed       Contact information for follow-up providers    [facility]. Schedule an appointment as soon as possible for a visit in 4 week(s).   Specialty: Neurology Contact information: [address] [employer] [facility] [employer] [place]       Belva Crome, MD.  Call in 1 day(s).   Specialty: Cardiology Why: Please call for a post hospital follow-up appointment. Contact information: [id number] [place] [address] [phone]        Constance Haw, MD .   Specialty: Cardiology Contact information: [address] [place] [address] [phone]            Contact information for after-discharge care    Destination    [facility] .   Service: Skilled Nursing Contact information: [address] [place] [phone]                 Allergies as of 06/25/2019      Reactions   Atorvastatin Other (See Comments)   Liver inflammation    Rosuvastatin Other (See Comments)   Muscle pain   Viagra [sildenafil] Other (See Comments)   Reaction ??      Medication List    STOP taking these medications   tamsulosin 0.4 MG Caps capsule Commonly known as: FLOMAX   zolpidem 5 MG tablet Commonly known as: AMBIEN     TAKE these medications   apixaban 5 MG Tabs tablet Commonly known as: Eliquis Take 1 tablet (5 mg total) by mouth 2 (two) times daily. Resume 12/27/18 evening dose What changed: additional instructions   aspirin EC 81 MG tablet Take 1 tablet (81 mg total) by mouth daily. What changed: when to take this   CALTRATE 600 PO Take 600 mg by mouth 2 (two) times a week.   ciprofloxacin 500 MG tablet Commonly known as: CIPRO Take 1 tablet (500 mg total) by mouth 2 (two) times daily for 7 days.   CoQ-10 100 MG Caps Take 100 mg by mouth daily.   cyanocobalamin 100 MCG tablet Take 100 mcg by mouth daily.   doxazosin 1 MG tablet Commonly known as: CARDURA Take 1 tablet (1 mg total) by mouth daily.   melatonin 3 MG Tabs tablet Take 1 tablet (3 mg total) by mouth at bedtime as needed.   multivitamin tablet Take 1 tablet by mouth daily.   saccharomyces boulardii 250 MG capsule Commonly known as:  FLORASTOR Take 1 capsule (250 mg total) by mouth 2 (two) times daily for 7 days.   senna 8.6 MG tablet Commonly known as: SENOKOT Take 1 tablet by mouth 2 (two) times daily as needed for constipation.   senna-docusate 8.6-50 MG tablet Commonly known as: Senokot-S Take 2 tablets by mouth 2 (two) times daily.   simvastatin 40 MG tablet Commonly known as: ZOCOR Take 1 tablet (40 mg total) by mouth daily at 6 PM. What changed:   medication strength  how much to take  when to take this   VITAMIN D-3 PO Take 1 capsule by mouth daily.       Time coordinating discharge: 35 minutes  The results of significant diagnostics from this hospitalization (including imaging, microbiology, ancillary and laboratory) are listed below for reference.    Procedures and Diagnostic Studies:   CT ANGIO HEAD W OR WO CONTRAST  Result  Date: 05/28/2019 CLINICAL DATA:  Follow-up examination for acute stroke. EXAM: CT ANGIOGRAPHY HEAD AND NECK TECHNIQUE: Multidetector CT imaging of the head and neck was performed using the standard protocol during bolus administration of intravenous contrast. Multiplanar CT image reconstructions and MIPs were obtained to evaluate the vascular anatomy. Carotid stenosis measurements (when applicable) are obtained utilizing NASCET criteria, using the distal internal carotid diameter as the denominator. CONTRAST:  135mL OMNIPAQUE IOHEXOL 350 MG/ML SOLN COMPARISON:  Prior CT and MRI from earlier the same day. FINDINGS: CTA NECK FINDINGS Aortic arch: Visualized aortic arch a within normal limits for caliber. Bovine branch pattern with common origin of the right brachiocephalic and left common carotid artery noted. Mild atheromatous plaque within the arch itself. No hemodynamically significant stenosis about the origin of the great vessels. Visualized subclavian arteries patent without flow-limiting stenosis. Right carotid system: Right CCA tortuous proximally but is widely patent to  the bifurcation without stenosis. Mixed eccentric plaque about the right bifurcation without hemodynamically significant stenosis. Probable changes related to previous right carotid endarterectomy. Right ICA widely patent distally to the skull base without stenosis, dissection, or occlusion eccentric plaque at the cervical petrous junction at the skull base with short-segment mild stenosis noted (series 6, image 225). Calcified atherosclerotic plaque seen within this region on prior noncontrast head CT. Left carotid system: Left CCA tortuous proximally but is widely patent to the bifurcation without significant stenosis. Bulky calcified plaque about the left bifurcation/proximal left ICA with associated stenosis of up to 50% by NASCET criteria. Left ICA mildly tortuous but otherwise widely patent distally to the skull base without stenosis, dissection or occlusion. Vertebral arteries: Both vertebral arteries arise from the subclavian arteries. Focal plaque at the origin of the right vertebral artery with mild ostial stenosis. Left vertebral artery dominant. Vertebral arteries mildly tortuous but otherwise widely patent within the neck without stenosis, dissection or occlusion. Skeleton: No visible acute osseous abnormality. No worrisome lytic or blastic osseous lesions. Dens is somewhat angulated, nonspecific, but could be related to remotely healed trauma. Moderate cervical spondylosis present at C5-6 and C6-7. Other neck: No other acute soft tissue abnormality within the neck. No mass lesion or adenopathy. 7 mm right thyroid nodule noted, of doubtful significance given size and patient age. No follow-up imaging recommended regarding this lesion. Upper chest: Visualized upper chest demonstrates no acute finding. Scattered predominantly subpleural reticular linear densities could reflect atelectasis, scarring, or possibly fibrosis. Left-sided pacemaker/AICD noted. Review of the MIP images confirms the above findings  CTA HEAD FINDINGS Anterior circulation: Petrous segments widely patent. Scattered atheromatous plaque throughout the cavernous/supraclinoid ICAs bilaterally. Associated short-segment severe stenosis seen at the supraclinoid right ICA (series 6, image 265). Moderate to severe short-segment stenosis present at the para clinoid left ICA as well (series 6, image 263). ICA termini well perfused. A1 segments patent bilaterally. Normal anterior communicating artery complex. Anterior cerebral arteries patent to their distal aspects without stenosis. No M1 stenosis or occlusion. Normal MCA bifurcations. Distal MCA branches well perfused and symmetric. Posterior circulation: Dominant left vertebral artery widely patent to the vertebrobasilar junction without stenosis. Mild atheromatous irregularity within the right vertebral artery without stenosis. Both picas patent. Basilar widely patent to its distal aspect without stenosis. Superior cerebral arteries patent bilaterally. Both PCAs primarily supplied via the basilar. Multifocal moderate to severe bilateral P2 stenoses noted. PCAs remain well perfused to their distal aspects. Venous sinuses: Patent allowing for timing the contrast bolus. Anatomic variants: None significant. 2 mm focal outpouching arising from  the supraclinoid left ICA demonstrates a tiny vessel extending from its apex, felt to be most consistent with a vascular infundibulum related to a hypoplastic left posterior communicating artery. No visible aneurysm. Review of the MIP images confirms the above findings IMPRESSION: 1. Negative CTA for large vessel occlusion. 2. Bulky atheromatous plaque about the left carotid bifurcation with associated 50% stenosis by NASCET criteria. 3. Prior right carotid endarterectomy without significant residual or recurrent stenosis. 4. Moderate to severe bilateral stenoses involving the supraclinoid ICAs bilaterally, right worse than left. 5. Moderate to severe multifocal  bilateral P2 stenoses. Electronically Signed   By: Jeannine Boga M.D.   On: 05/28/2019 22:23   DG Chest 2 View  Result Date: 05/28/2019 CLINICAL DATA:  Cough.  Chest pain.  Hypertension. EXAM: CHEST - 2 VIEW COMPARISON:  02/28/2019 FINDINGS: Pacer with leads at right atrium and right ventricle. No lead discontinuity. Midline trachea. Borderline cardiomegaly. Atherosclerosis in the transverse aorta. No pleural effusion or pneumothorax. No congestive failure. Mild left base scarring. IMPRESSION: No acute cardiopulmonary disease. Aortic Atherosclerosis (ICD10-I70.0). Electronically Signed   By: Abigail Miyamoto M.D.   On: 05/28/2019 13:55   DG Lumbar Spine Complete  Result Date: 05/28/2019 CLINICAL DATA:  Fall. EXAM: LUMBAR SPINE - COMPLETE 4+ VIEW COMPARISON:  October 08, 2014. FINDINGS: Diffuse osteopenia is noted. Moderate degenerative disc disease is noted at L1-2 and L2-3. Minimal retrolisthesis of L2-3 is noted. Mild degenerative disc disease is noted at L5-S1. No fracture is noted. IMPRESSION: Multilevel degenerative disc disease. No acute abnormality seen in the lumbar spine. Electronically Signed   By: Marijo Conception M.D.   On: 05/28/2019 16:44   CT Head Wo Contrast  Result Date: 05/28/2019 CLINICAL DATA:  Altered mental status, ataxia EXAM: CT HEAD WITHOUT CONTRAST TECHNIQUE: Contiguous axial images were obtained from the base of the skull through the vertex without intravenous contrast. COMPARISON:  08/02/2005, 06/04/2014 FINDINGS: Brain: No evidence of acute infarction, hemorrhage, hydrocephalus, extra-axial collection or mass lesion/mass effect. Focal encephalomalacia the left basal ganglia related to prior lacunar infarct. Scattered low-density changes within the periventricular and subcortical white matter compatible with chronic microvascular ischemic change. Moderate diffuse cerebral volume loss. Vascular: Atherosclerotic calcifications involving the large vessels of the skull base. No  unexpected hyperdense vessel. Skull: Normal. Negative for fracture or focal lesion. Sinuses/Orbits: No acute finding. Other: None. IMPRESSION: 1.  No acute intracranial findings. 2.  Chronic microvascular ischemic change and cerebral volume loss. Electronically Signed   By: Davina Poke D.O.   On: 05/28/2019 12:19   CT ANGIO NECK W OR WO CONTRAST  Result Date: 05/28/2019 CLINICAL DATA:  Follow-up examination for acute stroke. EXAM: CT ANGIOGRAPHY HEAD AND NECK TECHNIQUE: Multidetector CT imaging of the head and neck was performed using the standard protocol during bolus administration of intravenous contrast. Multiplanar CT image reconstructions and MIPs were obtained to evaluate the vascular anatomy. Carotid stenosis measurements (when applicable) are obtained utilizing NASCET criteria, using the distal internal carotid diameter as the denominator. CONTRAST:  155mL OMNIPAQUE IOHEXOL 350 MG/ML SOLN COMPARISON:  Prior CT and MRI from earlier the same day. FINDINGS: CTA NECK FINDINGS Aortic arch: Visualized aortic arch a within normal limits for caliber. Bovine branch pattern with common origin of the right brachiocephalic and left common carotid artery noted. Mild atheromatous plaque within the arch itself. No hemodynamically significant stenosis about the origin of the great vessels. Visualized subclavian arteries patent without flow-limiting stenosis. Right carotid system: Right CCA tortuous proximally but is  widely patent to the bifurcation without stenosis. Mixed eccentric plaque about the right bifurcation without hemodynamically significant stenosis. Probable changes related to previous right carotid endarterectomy. Right ICA widely patent distally to the skull base without stenosis, dissection, or occlusion eccentric plaque at the cervical petrous junction at the skull base with short-segment mild stenosis noted (series 6, image 225). Calcified atherosclerotic plaque seen within this region on prior  noncontrast head CT. Left carotid system: Left CCA tortuous proximally but is widely patent to the bifurcation without significant stenosis. Bulky calcified plaque about the left bifurcation/proximal left ICA with associated stenosis of up to 50% by NASCET criteria. Left ICA mildly tortuous but otherwise widely patent distally to the skull base without stenosis, dissection or occlusion. Vertebral arteries: Both vertebral arteries arise from the subclavian arteries. Focal plaque at the origin of the right vertebral artery with mild ostial stenosis. Left vertebral artery dominant. Vertebral arteries mildly tortuous but otherwise widely patent within the neck without stenosis, dissection or occlusion. Skeleton: No visible acute osseous abnormality. No worrisome lytic or blastic osseous lesions. Dens is somewhat angulated, nonspecific, but could be related to remotely healed trauma. Moderate cervical spondylosis present at C5-6 and C6-7. Other neck: No other acute soft tissue abnormality within the neck. No mass lesion or adenopathy. 7 mm right thyroid nodule noted, of doubtful significance given size and patient age. No follow-up imaging recommended regarding this lesion. Upper chest: Visualized upper chest demonstrates no acute finding. Scattered predominantly subpleural reticular linear densities could reflect atelectasis, scarring, or possibly fibrosis. Left-sided pacemaker/AICD noted. Review of the MIP images confirms the above findings CTA HEAD FINDINGS Anterior circulation: Petrous segments widely patent. Scattered atheromatous plaque throughout the cavernous/supraclinoid ICAs bilaterally. Associated short-segment severe stenosis seen at the supraclinoid right ICA (series 6, image 265). Moderate to severe short-segment stenosis present at the para clinoid left ICA as well (series 6, image 263). ICA termini well perfused. A1 segments patent bilaterally. Normal anterior communicating artery complex. Anterior  cerebral arteries patent to their distal aspects without stenosis. No M1 stenosis or occlusion. Normal MCA bifurcations. Distal MCA branches well perfused and symmetric. Posterior circulation: Dominant left vertebral artery widely patent to the vertebrobasilar junction without stenosis. Mild atheromatous irregularity within the right vertebral artery without stenosis. Both picas patent. Basilar widely patent to its distal aspect without stenosis. Superior cerebral arteries patent bilaterally. Both PCAs primarily supplied via the basilar. Multifocal moderate to severe bilateral P2 stenoses noted. PCAs remain well perfused to their distal aspects. Venous sinuses: Patent allowing for timing the contrast bolus. Anatomic variants: None significant. 2 mm focal outpouching arising from the supraclinoid left ICA demonstrates a tiny vessel extending from its apex, felt to be most consistent with a vascular infundibulum related to a hypoplastic left posterior communicating artery. No visible aneurysm. Review of the MIP images confirms the above findings IMPRESSION: 1. Negative CTA for large vessel occlusion. 2. Bulky atheromatous plaque about the left carotid bifurcation with associated 50% stenosis by NASCET criteria. 3. Prior right carotid endarterectomy without significant residual or recurrent stenosis. 4. Moderate to severe bilateral stenoses involving the supraclinoid ICAs bilaterally, right worse than left. 5. Moderate to severe multifocal bilateral P2 stenoses. Electronically Signed   By: Jeannine Boga M.D.   On: 05/28/2019 22:23   MR BRAIN WO CONTRAST  Result Date: 05/28/2019 CLINICAL DATA:  Ataxia, stroke suspected. EXAM: MRI HEAD WITHOUT CONTRAST TECHNIQUE: Multiplanar, multiecho pulse sequences of the brain and surrounding structures were obtained without intravenous contrast. COMPARISON:  Head  CT 05/28/2019 and MRI 06/04/2014 FINDINGS: Brain: There is a small acute infarct involving the left corona  radiata and basal ganglia posterior to a chronic infarct, and there is also a small acute infarct along the lateral margin of the right thalamus. There is ex vacuo dilatation of the frontal horn of the left lateral ventricle. A few scattered small chronic cerebral hemorrhages are unchanged. T2 hyperintensities in the cerebral white matter bilaterally have slightly progressed from the prior MRI and are nonspecific but compatible with mild chronic small vessel ischemic disease. No mass, midline shift, or extra-axial fluid collection is identified. There is moderate cerebral and cerebellar atrophy. Vascular: Major intracranial vascular flow voids are preserved. Skull and upper cervical spine: Unremarkable bone marrow signal. Sinuses/Orbits: Unremarkable orbits. Minimal mucosal thickening in the paranasal sinuses. Clear paranasal sinuses. Other: Postoperative changes to the right parietal scalp. IMPRESSION: 1. Small acute infarcts in the left basal ganglia/corona radiata and right thalamus. 2. Chronic small vessel ischemia and moderate cerebral atrophy. Electronically Signed   By: Logan Bores M.D.   On: 05/28/2019 15:41   DG CHEST PORT 1 VIEW  Result Date: 05/29/2019 CLINICAL DATA:  Shortness of breath EXAM: PORTABLE CHEST 1 VIEW COMPARISON:  05/28/2019 FINDINGS: Single frontal view of the chest demonstrates stable dual lead pacemaker. Cardiac silhouette is unremarkable. No airspace disease, effusion, or pneumothorax. Minimal atherosclerosis within the aorta. No acute bony abnormalities. IMPRESSION: 1. Stable exam, no acute process. Electronically Signed   By: Randa Ngo M.D.   On: 05/29/2019 15:27   ECHOCARDIOGRAM COMPLETE  Result Date: 05/29/2019    ECHOCARDIOGRAM REPORT   Patient Name:   William Mann Date of Exam: 05/29/2019 Medical Rec #:  [id number]           Height:       68.0 in Accession #:    [id number]          Weight:       150.0 lb Date of Birth:  December 28, 1929           BSA:          1.809 m  Patient Age:    26 years            BP:           158/99 mmHg Patient Gender: M                   HR:           83 bpm. Exam Location:  Inpatient Procedure: 2D Echo, Cardiac Doppler, Color Doppler and Intracardiac            Opacification Agent Indications:    Stroke 434.91/I163.9  History:        Patient has prior history of Echocardiogram examinations, most                 recent 12/21/2018. Pacemaker. Heart block.  Sonographer:    Clayton Lefort RDCS (AE) Referring Phys: [id number] Lequita Halt  Sonographer Comments: Technically difficult study due to poor echo windows, Technically challenging study due to limited acoustic windows, suboptimal parasternal window, suboptimal apical window and no subcostal window. Limited patient mobility. IMPRESSIONS  1. Left ventricular ejection fraction, by estimation, is 55%. The left ventricle has normal function. The left ventricle has no regional wall motion abnormalities. Left ventricular diastolic parameters are consistent with Grade I diastolic dysfunction (impaired relaxation).  2. Right ventricular systolic function is normal. The right ventricular size is normal. Tricuspid  regurgitation signal is inadequate for assessing PA pressure.  3. Left atrial size was mildly dilated.  4. The mitral valve is normal in structure. No evidence of mitral valve regurgitation. No evidence of mitral stenosis.  5. The aortic valve is tricuspid. Aortic valve regurgitation is not visualized. Mild aortic valve sclerosis is present, with no evidence of aortic valve stenosis.  6. Aortic dilatation noted. There is mild dilatation of the ascending aorta measuring 41 mm.  7. Technically difficult study with poor acoustic windows. FINDINGS  Left Ventricle: Left ventricular ejection fraction, by estimation, is 55%. The left ventricle has normal function. The left ventricle has no regional wall motion abnormalities. Definity contrast agent was given IV to delineate the left ventricular endocardial borders.  The left ventricular internal cavity size was normal in size. There is no left ventricular hypertrophy. Left ventricular diastolic parameters are consistent with Grade I diastolic dysfunction (impaired relaxation). Right Ventricle: The right ventricular size is normal. No increase in right ventricular wall thickness. Right ventricular systolic function is normal. Tricuspid regurgitation signal is inadequate for assessing PA pressure. Left Atrium: Left atrial size was mildly dilated. Right Atrium: Right atrial size was normal in size. Pericardium: There is no evidence of pericardial effusion. Mitral Valve: The mitral valve is normal in structure. Moderate mitral annular calcification. No evidence of mitral valve regurgitation. No evidence of mitral valve stenosis. Tricuspid Valve: The tricuspid valve is normal in structure. Tricuspid valve regurgitation is not demonstrated. Aortic Valve: The aortic valve is tricuspid. Aortic valve regurgitation is not visualized. Mild aortic valve sclerosis is present, with no evidence of aortic valve stenosis. Aortic valve mean gradient measures 4.0 mmHg. Aortic valve peak gradient measures 7.2 mmHg. Aortic valve area, by VTI measures 2.07 cm. Pulmonic Valve: The pulmonic valve was normal in structure. Pulmonic valve regurgitation is not visualized. Aorta: Aortic dilatation noted. There is mild dilatation of the ascending aorta measuring 41 mm. Venous: The inferior vena cava was not well visualized. IAS/Shunts: No atrial level shunt detected by color flow Doppler. Additional Comments: A pacer wire is visualized in the right ventricle.  LEFT VENTRICLE PLAX 2D LVIDd:         4.29 cm LVIDs:         3.11 cm LV PW:         1.00 cm LV IVS:        1.07 cm LVOT diam:     1.80 cm LV SV:         50 LV SV Index:   28 LVOT Area:     2.54 cm  RIGHT VENTRICLE RV S prime:     11.30 cm/s TAPSE (M-mode): 1.9 cm LEFT ATRIUM           Index LA diam:      3.70 cm 2.05 cm/m LA Vol (A2C): 22.9 ml  12.66 ml/m LA Vol (A4C): 50.9 ml 28.14 ml/m  AORTIC VALVE AV Area (Vmax):    2.16 cm AV Area (Vmean):   1.99 cm AV Area (VTI):     2.07 cm AV Vmax:           134.00 cm/s AV Vmean:          94.400 cm/s AV VTI:            0.242 m AV Peak Grad:      7.2 mmHg AV Mean Grad:      4.0 mmHg LVOT Vmax:         114.00 cm/s LVOT  Vmean:        73.900 cm/s LVOT VTI:          0.197 m LVOT/AV VTI ratio: 0.81  AORTA Ao Root diam: 3.60 cm Ao Asc diam:  4.10 cm MITRAL VALVE MV Area (PHT): 4.80 cm     SHUNTS MV Decel Time: 158 msec     Systemic VTI:  0.20 m MV E velocity: 71.70 cm/s   Systemic Diam: 1.80 cm MV A velocity: 157.00 cm/s MV E/A ratio:  0.46 Loralie Champagne MD Electronically signed by Loralie Champagne MD Signature Date/Time: 05/29/2019/5:20:01 PM    Final    VAS [place] LOWER EXTREMITY VENOUS (DVT)  Result Date: 05/29/2019  Lower Venous DVTStudy Indications: Stroke.  Risk Factors: DVT. Anticoagulation: Heparin. Comparison Study: 07/18/2018 - Right chronic DVT CFV, FV, PopV Performing Technologist: Oliver Hum RVT  Examination Guidelines: A complete evaluation includes B-mode imaging, spectral Doppler, color Doppler, and power Doppler as needed of all accessible portions of each vessel. Bilateral testing is considered an integral part of a complete examination. Limited examinations for reoccurring indications may be performed as noted. The reflux portion of the exam is performed with the patient in reverse Trendelenburg.  +---------+---------------+---------+-----------+----------+-----------------+ RIGHT    CompressibilityPhasicitySpontaneityPropertiesThrombus Aging    +---------+---------------+---------+-----------+----------+-----------------+ CFV      Full           Yes      Yes                                    +---------+---------------+---------+-----------+----------+-----------------+ SFJ      Full                                                            +---------+---------------+---------+-----------+----------+-----------------+ FV Prox  Partial        Yes      Yes                  Chronic           +---------+---------------+---------+-----------+----------+-----------------+ FV Mid   Partial        Yes      Yes                  Chronic           +---------+---------------+---------+-----------+----------+-----------------+ FV DistalPartial        Yes      Yes                  Chronic           +---------+---------------+---------+-----------+----------+-----------------+ PFV      Full                                                           +---------+---------------+---------+-----------+----------+-----------------+ POP      Full           No       No                                     +---------+---------------+---------+-----------+----------+-----------------+  PTV      Full                                                           +---------+---------------+---------+-----------+----------+-----------------+ PERO     Full                                                           +---------+---------------+---------+-----------+----------+-----------------+ Gastroc  None                                         Age Indeterminate +---------+---------------+---------+-----------+----------+-----------------+   +---------+---------------+---------+-----------+----------+--------------+ LEFT     CompressibilityPhasicitySpontaneityPropertiesThrombus Aging +---------+---------------+---------+-----------+----------+--------------+ CFV      Full           Yes      Yes                                 +---------+---------------+---------+-----------+----------+--------------+ SFJ      Full                                                        +---------+---------------+---------+-----------+----------+--------------+ FV Prox  Full                                                         +---------+---------------+---------+-----------+----------+--------------+ FV Mid   Full                                                        +---------+---------------+---------+-----------+----------+--------------+ FV DistalFull                                                        +---------+---------------+---------+-----------+----------+--------------+ PFV      Full                                                        +---------+---------------+---------+-----------+----------+--------------+ POP      Full           Yes      Yes                                 +---------+---------------+---------+-----------+----------+--------------+  PTV      Full                                                        +---------+---------------+---------+-----------+----------+--------------+ PERO     Full                                                        +---------+---------------+---------+-----------+----------+--------------+     Summary: RIGHT: - Findings consistent with age indeterminate deep vein thrombosis involving the right gastrocnemius veins. - Findings consistent with chronic deep vein thrombosis involving the right femoral vein. - No cystic structure found in the popliteal fossa.  LEFT: - There is no evidence of deep vein thrombosis in the lower extremity.  - No cystic structure found in the popliteal fossa.  *See table(s) above for measurements and observations. Electronically signed by Monica Martinez MD on 05/29/2019 at 8:07:48 PM.    Final      Labs:   Basic Metabolic Panel: Recent Labs  Lab 06/21/19 0422 06/21/19 0422 06/23/19 1243 06/23/19 1243 06/24/19 0256 06/25/19 0508  NA 141  --  141  --  143 142  K 3.9   < > 4.1   < > 3.7 3.6  CL 108  --  106  --  112* 112*  CO2 28  --  25  --  23 22  GLUCOSE 84  --  104*  --  107* 86  BUN 16  --  17  --  20 22  CREATININE 0.90  --  0.84  --  0.88 0.73  CALCIUM 8.1*  --  8.4*  --  8.2*  8.0*  MG 1.9  --  1.9  --   --   --   PHOS  --   --  3.2  --   --   --    < > = values in this interval not displayed.   GFR Estimated Creatinine Clearance: 61.8 mL/min (by C-G formula based on SCr of 0.73 mg/dL). Liver Function Tests: Recent Labs  Lab 06/23/19 1243 06/24/19 0256 06/25/19 0508  AST 51* 32 46*  ALT 57* 45* 46*  ALKPHOS 104 85 85  BILITOT 1.1 1.2 0.8  PROT 5.5* 4.7* 5.1*  ALBUMIN 2.4* 2.0* 2.1*   No results for input(s): LIPASE, AMYLASE in the last 168 hours. Recent Labs  Lab 06/24/19 0256  AMMONIA 29   Coagulation profile No results for input(s): INR, PROTIME in the last 168 hours.  CBC: Recent Labs  Lab 06/20/19 0255 06/21/19 0422 06/23/19 1243 06/24/19 0256 06/25/19 0508  WBC 7.2 6.6 22.6* 26.8* 14.2*  NEUTROABS 5.2 4.5 21.0* 24.4* 11.9*  HGB 13.6 12.7* 15.6 14.5 14.1  HCT 40.9 39.1 46.8 43.5 42.5  MCV 103.0* 104.8* 104.5* 104.3* 104.9*  PLT 171 190 239 233 186   Cardiac Enzymes: No results for input(s): CKTOTAL, CKMB, CKMBINDEX, TROPONINI in the last 168 hours. BNP: Invalid input(s): POCBNP CBG: Recent Labs  Lab 06/24/19 1644 06/24/19 2001 06/24/19 2333 06/25/19 0359 06/25/19 0737  GLUCAP 122* 133* 87 103* 74   D-Dimer No results for input(s): DDIMER in the last 72 hours. Hgb A1c No  results for input(s): HGBA1C in the last 72 hours. Lipid Profile No results for input(s): CHOL, HDL, LDLCALC, TRIG, CHOLHDL, LDLDIRECT in the last 72 hours. Thyroid function studies No results for input(s): TSH, T4TOTAL, T3FREE, THYROIDAB in the last 72 hours.  Invalid input(s): FREET3 Anemia work up No results for input(s): VITAMINB12, FOLATE, FERRITIN, TIBC, IRON, RETICCTPCT in the last 72 hours. Microbiology Recent Results (from the past 240 hour(s))  SARS CORONAVIRUS 2 (TAT 6-24 HRS) Nasopharyngeal Nasopharyngeal Swab     Status: None   Collection Time: 06/21/19  2:22 PM   Specimen: Nasopharyngeal Swab  Result Value Ref Range Status   SARS  Coronavirus 2 NEGATIVE NEGATIVE Final    Comment: (NOTE) SARS-CoV-2 target nucleic acids are NOT DETECTED. The SARS-CoV-2 RNA is generally detectable in upper and lower respiratory specimens during the acute phase of infection. Negative results do not preclude SARS-CoV-2 infection, do not rule out co-infections with other pathogens, and should not be used as the sole basis for treatment or other patient management decisions. Negative results must be combined with clinical observations, patient history, and epidemiological information. The expected result is Negative. Fact Sheet for Patients: SugarRoll.be Fact Sheet for Healthcare Providers: https://www.woods-mathews.com/ This test is not yet approved or cleared by the [place] FDA and  has been authorized for detection and/or diagnosis of SARS-CoV-2 by FDA under an Emergency Use Authorization (EUA). This EUA will remain  in effect (meaning this test can be used) for the duration of the COVID-19 declaration under Section 56 4(b)(1) of the Act, 21 U.S.C. section 360bbb-3(b)(1), unless the authorization is terminated or revoked sooner. Performed at [facility], [place] [address], [place], [place] [address]   Culture, Urine     Status: Abnormal   Collection Time: 06/23/19  2:35 PM   Specimen: Urine, Random  Result Value Ref Range Status   Specimen Description URINE, RANDOM  Final   Special Requests   Final    NONE Performed at [facility], [place] [address] [place], [place] [address]    Culture >=100,000 COLONIES/mL PSEUDOMONAS AERUGINOSA (A)  Final   Report Status 06/25/2019 FINAL  Final   Organism ID, Bacteria PSEUDOMONAS AERUGINOSA (A)  Final      Susceptibility   Pseudomonas aeruginosa - MIC*    CEFTAZIDIME 2 SENSITIVE Sensitive     CIPROFLOXACIN <=0.25 SENSITIVE Sensitive     GENTAMICIN <=1 SENSITIVE Sensitive     IMIPENEM 2 SENSITIVE Sensitive     PIP/TAZO <=4  SENSITIVE Sensitive     CEFEPIME <=1 SENSITIVE Sensitive     * >=100,000 COLONIES/mL PSEUDOMONAS AERUGINOSA  MRSA PCR Screening     Status: None   Collection Time: 06/23/19  4:14 PM   Specimen: Nasopharyngeal  Result Value Ref Range Status   MRSA by PCR NEGATIVE NEGATIVE Final    Comment:        The GeneXpert MRSA Assay (FDA approved for NASAL specimens only), is one component of a comprehensive MRSA colonization surveillance program. It is not intended to diagnose MRSA infection nor to guide or monitor treatment for MRSA infections. Performed at [facility], [place] [address] [place], [place] [address]   Culture, blood (routine x 2)     Status: None (Preliminary result)   Collection Time: 06/23/19  4:24 PM   Specimen: BLOOD LEFT ARM  Result Value Ref Range Status   Specimen Description BLOOD LEFT ARM  Final   Special Requests   Final  BOTTLES DRAWN AEROBIC AND ANAEROBIC Blood Culture adequate volume   Culture   Final    NO GROWTH 2 DAYS Performed at [facility], [place] [address] [place], [place] [address]    Report Status PENDING  Incomplete  Culture, blood (routine x 2)     Status: None (Preliminary result)   Collection Time: 06/23/19  4:25 PM   Specimen: BLOOD RIGHT HAND  Result Value Ref Range Status   Specimen Description BLOOD RIGHT HAND  Final   Special Requests   Final    BOTTLES DRAWN AEROBIC AND ANAEROBIC Blood Culture adequate volume   Culture   Final    NO GROWTH 2 DAYS Performed at [facility], [place] [address], [place], [place] [address]    Report Status PENDING  Incomplete    Please note: You were cared for by a hospitalist during your hospital stay. Once you are discharged, your primary care physician will handle any further medical issues. Please note that NO REFILLS for any discharge medications will be authorized once you are discharged, as it is imperative that you return to your primary care physician (or establish a relationship  with a primary care physician if you do not have one) for your post hospital discharge needs so that they can reassess your need for medications and monitor your lab values.  Signed: Terrilee Croak  [facility] 06/25/2019, 12:04 PM

## 2019-06-25 NOTE — Progress Notes (Signed)
Report given to [facility] at [facility] facility.

## 2019-06-27 DIAGNOSIS — I739 Peripheral vascular disease, unspecified: Secondary | ICD-10-CM | POA: Diagnosis not present

## 2019-06-27 DIAGNOSIS — I69391 Dysphagia following cerebral infarction: Secondary | ICD-10-CM | POA: Diagnosis not present

## 2019-06-27 DIAGNOSIS — N3000 Acute cystitis without hematuria: Secondary | ICD-10-CM | POA: Diagnosis not present

## 2019-06-27 DIAGNOSIS — I10 Essential (primary) hypertension: Secondary | ICD-10-CM | POA: Diagnosis not present

## 2019-06-27 DIAGNOSIS — I639 Cerebral infarction, unspecified: Secondary | ICD-10-CM | POA: Diagnosis not present

## 2019-06-28 LAB — CULTURE, BLOOD (ROUTINE X 2)
Culture: NO GROWTH
Culture: NO GROWTH
Special Requests: ADEQUATE
Special Requests: ADEQUATE

## 2019-07-01 DIAGNOSIS — G4700 Insomnia, unspecified: Secondary | ICD-10-CM | POA: Diagnosis not present

## 2019-07-01 DIAGNOSIS — R4189 Other symptoms and signs involving cognitive functions and awareness: Secondary | ICD-10-CM | POA: Diagnosis not present

## 2019-07-09 ENCOUNTER — Ambulatory Visit (INDEPENDENT_AMBULATORY_CARE_PROVIDER_SITE_OTHER): Payer: Medicare Other | Admitting: *Deleted

## 2019-07-09 DIAGNOSIS — I442 Atrioventricular block, complete: Secondary | ICD-10-CM | POA: Diagnosis not present

## 2019-07-09 LAB — CUP PACEART REMOTE DEVICE CHECK
Date Time Interrogation Session: 20210602153652
Implantable Lead Implant Date: 20201116
Implantable Lead Implant Date: 20201116
Implantable Lead Location: 753859
Implantable Lead Location: 753860
Implantable Pulse Generator Implant Date: 20201116
Pulse Gen Model: 2272
Pulse Gen Serial Number: 9172104

## 2019-07-15 NOTE — Progress Notes (Signed)
Remote pacemaker transmission.   

## 2019-07-24 ENCOUNTER — Ambulatory Visit (INDEPENDENT_AMBULATORY_CARE_PROVIDER_SITE_OTHER): Payer: Medicare Other | Admitting: Adult Health

## 2019-07-24 ENCOUNTER — Encounter: Payer: Self-pay | Admitting: Adult Health

## 2019-07-24 VITALS — BP 116/82 | HR 44 | Ht 68.0 in | Wt 171.0 lb

## 2019-07-24 DIAGNOSIS — I639 Cerebral infarction, unspecified: Secondary | ICD-10-CM | POA: Diagnosis not present

## 2019-07-24 DIAGNOSIS — E785 Hyperlipidemia, unspecified: Secondary | ICD-10-CM | POA: Diagnosis not present

## 2019-07-24 DIAGNOSIS — I10 Essential (primary) hypertension: Secondary | ICD-10-CM

## 2019-07-24 DIAGNOSIS — I6523 Occlusion and stenosis of bilateral carotid arteries: Secondary | ICD-10-CM

## 2019-07-24 NOTE — Progress Notes (Signed)
[facility] [address] [place]. [place] [address] [phone]       HOSPITAL FOLLOW UP NOTE  [name] Date of Birth:  07-07-1929 Medical Record Number:  [id number]   Reason for Referral:  hospital stroke follow up    SUBJECTIVE:   CHIEF COMPLAINT:  Chief Complaint  Patient presents with  . Follow-up    stroke fu, rm 9, alone, pt has difficulty speaking, states he is doing well     HPI:   [name] is a 86 y.o. male with history of R ICA stenosis s/p R CEA,PVD, hypertension, hyperlipidemia, lower extremity DVT on Eliquis, carotid artery occlusion, atherosclerosis and bilateral hearing loss who presented on 05/28/2019 with dysarthria, facial droop, R leg weakness. Stroke work-up revealed left BG/CR and right PLIC/thalamic infarcts secondary to small vessel disease with repeat MRI 1 week later showing extended BG stroke. CTA head/neck left ICA plaque w/ 50% stenosis, right ICA s/p CEA hx patent, R>L supraclinoid ICA and B PCA moderate to severe stenosis. LE Doppler showed right gastrocnemius being indeterminate thrombosis and right femoral with chronic DVT recommended continuation of Eliquis (on Xarelto initially switched to Eliquis 1 year ago). Also recommended continuation of aspirin in addition to [facility] for secondary stroke prevention. History of HTN and recommended long-term BP goal 120-150 due to intracranial stenosis to ensure adequate perfusion. LDL 160 and increased home dose simvastatin from 10 mg to 40 mg daily. Other stroke risk factors include advanced age, family history of stroke, CAD, PVD, hx R ICA stenosis s/p R CEA and PPM. No prior stroke history. Other active problems include: Infection 3 months prior negative this admission, BPH on Flomax and extended stroke with clinical worsening on 4/27 prompting palliative care consult due to poor general medical condition. Hospital course complicated by UTI with altered mental status  and aspiration pneumonia. Residual deficits of dysphagia and ambulatory dysfunction post stroke and discharged to SNF for ongoing therapy needs.  Stroke:   L BG/CR and R PLIC/thalamic infarcts secondary to small vessel disease    CT head No acute abnormality. Small vessel disease. Atrophy.   MRI  Small L basal ganglia / corona radiata and R thalamic infarcts. Small vessel disease. Atrophy.   Repeated MRI 4/27: Extended BG stroke.  CTA head & neck no LVO. L ICA plaque w/ 50% stenosis. R CEA patent. R>L supraclinoid ICA and B PCA moderate to severe stenoses.   LE Doppler  R gastrocnemius veing indeterminate thrombosis. R femoral w/ chronic DVT.  2D Echo EF 55%, LA mild dilation  LDL 160  HgbA1c 5.0  Heparin for VTE prophylaxis    aspirin 81 mg daily and Eliquis (apixaban) daily prior to admission, now on  eliquis   Therapy recommendations:  CIRvs SNF  Disposition:  SNF   Today, 07/24/2019, [name] is being seen for hospital follow-up.  He is currently unaccompanied and currently residing at [place] [facility].  Information reviewed provided by facility with ongoing dysphagia (Dys1 NTL diet), dysarthria, and right hemiparesis.  Continues to participate in PT/OT/ST.  Continues on aspirin 81 mg daily and Eliquis 5 mg twice daily and denies bleeding or bruising.  Continues on simvastatin 40 mg daily and denies myalgias.  Blood pressure 116/82.  Currently sitting in wheelchair and difficulty understanding due to moderate to severe dysarthria.  Likely cognitive impairment but difficulty fully assessing.  Unable to appreciate new stroke/TIA symptoms during today's visit.    ROS:   Unable  to obtain due to dysarthria and likely cognitive impairment  PMH:  Past Medical History:  Diagnosis Date  . Aortic atherosclerosis ([place])   . Bilateral hearing loss   . BPH (benign prostatic hyperplasia)   . Cancer ([place])    basal cell carcinoma  . Carotid artery occlusion   .  Constipation   . Coronary artery disease   . COVID-19   . DVT (deep venous thrombosis) ([place])   . Emphysema lung ([place])   . Fatty liver   . GERD (gastroesophageal reflux disease)   . History of echocardiogram    Echo 4/17: Vigorous LVEF, EF 65-70%, normal wall motion, grade 1 diastolic dysfunction, mildly dilated ascending aorta (40 mm)  . Hyperlipidemia   . Hypertension   . Insomnia   . PVD (peripheral vascular disease) ([place])   . Thyroid nodule     PSH:  Past Surgical History:  Procedure Laterality Date  . APPENDECTOMY    . CAROTID ENDARTERECTOMY  03-05-09   right CEA  . COLONOSCOPY  Oct. 2013   one polyp  . HERNIA REPAIR    . LIPOMA EXCISION    . PACEMAKER IMPLANT N/A 12/23/2018   Procedure: PACEMAKER IMPLANT;  Surgeon: Evans Lance, MD;  Location: [place] [facility];  Service: Cardiovascular;  Laterality: N/A;    Social History:  Social History   Socioeconomic History  . Marital status: Married    Spouse name: Not on file  . Number of children: Not on file  . Years of education: Not on file  . Highest education level: Not on file  Occupational History  . Not on file  Tobacco Use  . Smoking status: Never Smoker  . Smokeless tobacco: Never Used  Vaping Use  . Vaping Use: Never used  Substance and Sexual Activity  . Alcohol use: No  . Drug use: No  . Sexual activity: Not Currently  Other Topics Concern  . Not on file  Social History Narrative  . Not on file   Social Determinants of Health   Financial Resource Strain:   . Difficulty of Paying Living Expenses:   Food Insecurity:   . Worried About Charity fundraiser in the Last Year:   . Arboriculturist in the Last Year:   Transportation Needs:   . Film/video editor (Medical):   Marland Kitchen Lack of Transportation (Non-Medical):   Physical Activity:   . Days of Exercise per Week:   . Minutes of Exercise per Session:   Stress:   . Feeling of Stress :   Social Connections:   . Frequency of Communication  with Friends and Family:   . Frequency of Social Gatherings with Friends and Family:   . Attends Religious Services:   . Active Member of Clubs or Organizations:   . Attends Archivist Meetings:   Marland Kitchen Marital Status:   Intimate Partner Violence:   . Fear of Current or Ex-Partner:   . Emotionally Abused:   Marland Kitchen Physically Abused:   . Sexually Abused:     Family History:  Family History  Problem Relation Age of Onset  . Heart disease Sister   . Cancer Sister   . Hypertension Sister   . Heart attack Sister   . Heart disease Brother   . Stroke Brother   . Hypertension Brother   . Heart disease Brother   . Hyperlipidemia Son     Medications:   Current Outpatient Medications on File Prior to Visit  Medication Sig Dispense Refill  . apixaban (ELIQUIS) 5 MG TABS tablet Take 1 tablet (5 mg total) by mouth 2 (two) times daily. Resume 12/27/18 evening dose (Patient taking differently: Take 5 mg by mouth 2 (two) times daily. ) 60 tablet 1  . aspirin EC 81 MG tablet Take 1 tablet (81 mg total) by mouth daily. 360 tablet 0  . Calcium Carbonate (CALTRATE 600 PO) Take 600 mg by mouth 2 (two) times a week.     . Cholecalciferol (VITAMIN D-3 PO) Take 1 capsule by mouth daily.     . Coenzyme Q10 (COQ-10) 100 MG CAPS Take 100 mg by mouth daily.     . cyanocobalamin 100 MCG tablet Take 100 mcg by mouth daily.    Marland Kitchen doxazosin (CARDURA) 1 MG tablet Take 1 tablet (1 mg total) by mouth daily. 90 tablet 0  . LORazepam (ATIVAN) 0.5 MG tablet Take 0.5 mg by mouth every 8 (eight) hours.    . Multiple Vitamin (MULTIVITAMIN) tablet Take 1 tablet by mouth daily.    Marland Kitchen senna (SENOKOT) 8.6 MG tablet Take 1 tablet by mouth 2 (two) times daily as needed for constipation.    . senna-docusate (SENOKOT-S) 8.6-50 MG tablet Take 2 tablets by mouth 2 (two) times daily.    . simvastatin (ZOCOR) 40 MG tablet Take 1 tablet (40 mg total) by mouth daily at 6 PM. 90 tablet 0   No current facility-administered  medications on file prior to visit.    Allergies:   Allergies  Allergen Reactions  . Atorvastatin Other (See Comments)    Liver inflammation    . Rosuvastatin Other (See Comments)    Muscle pain  . Viagra [Sildenafil] Other (See Comments)    Reaction ??      OBJECTIVE:  Physical Exam  Vitals:   07/24/19 1008  BP: 116/82  Pulse: (!) 44  Weight: 171 lb (77.6 kg)  Height: 5\' 8"  (1.727 m)   Body mass index is 26 kg/m. No exam data present  No flowsheet data found.   General: Frail cooperative elderly Caucasian male, seated in wheelchair Head: head normocephalic and atraumatic.   Neck: supple with no carotid or supraclavicular bruits Cardiovascular: regular rate and rhythm, no murmurs Musculoskeletal: no deformity Skin:  no rash/petichiae Vascular:  Normal pulses all extremities   Neurologic Exam Mental Status: Awake and fully alert.   Moderate to severe dysarthria.  Disoriented to place and time but able to state current SNF facility he is residing at. Recent and remote memory UTA. Attention span, concentration and fund of knowledge slightly impaired. Mood and affect appropriate and cooperative with exam.  Cranial Nerves: Fundoscopic exam unable to complete due to uncooperation.  Pupils equal, briskly reactive to light. Extraocular movements full without nystagmus. Visual fields blink to threat in all quadrants. Hearing appears intact. Facial sensation intact. Face, tongue, palate moves normally and symmetrically.  Motor: Normal bulk and tone. Normal strength in all tested extremity muscles. RUE: 3/5 with decreased grip strength RLE: 4/5 LUE: 4/5 LLE: 4/5 Sensory.: intact to touch , pinprick , position and vibratory sensation.  Coordination: Rapid alternating movements decreased in bilateral upper extremities R>L. Finger-to-nose and heel-to-shin difficulty performing as patient had difficulty understanding. Gait and Station: Deferred as likely nonambulatory Reflexes:  1+ and symmetric. Toes downgoing.     NIHSS 9 1a.  Level of consciousness 0 1b. LOC questions 2 1c. LOC commands 1 2.  Best gaze 0 3.  Visual 0 4.  Facial palsy  1 5a.  Motor arm-left 0 5b.  Motor arm-right 2 6a.  Motor leg-left 0 6b.  Motor leg-right 1 7.  Limb ataxia 0 8.  Sensory 0 9.  Best language 0 10.  Dysarthria 2 11.  Extinction and inattention 0 Modified Rankin  4      ASSESSMENT: William Mann is a 86 y.o. year old male presented with dysarthria, facial droop and left leg weakness on 05/28/2019 with stroke work-up revealing left BG/CR and R PLIC/thalamic infarcts secondary to small vessel disease with evidence of extended BG stroke with clinical worsening. Vascular risk factors include HTN, HLD, hx R ICA stenosis s/p CEA, LE DVT on Eliquis, carotid occlusion, intracranial atherosclerosis, CAD, PVD and PPM.  Residual deficits of right hemiparesis, moderate to severe dysarthria, dysphagia and likely cognitive impairment.  Difficulty with today's visit with residual stroke deficits as patient unaccompanied currently residing at SNF     PLAN:  1. L BG/CR and R PLIC/thalamic stroke:  -Residual deficits as above with ongoing PT/OT/ST -Continue aspirin 81 mg daily and Eliquis (apixaban) daily  and simvastatin 40 mg daily for secondary stroke prevention.  -Maintain strict control of hypertension with blood pressure goal below 130/90, diabetes with hemoglobin A1c goal below 6.5% and cholesterol with LDL cholesterol (bad cholesterol) goal below 70 mg/dL.  I also advised the patient to eat a healthy diet with plenty of whole grains, cereals, fruits and vegetables, exercise regularly with at least 30 minutes of continuous activity daily and maintain ideal body weight. 2. HTN: Stable today.  Ongoing management by PCP/SNF facility 3. HLD: Continuation of simvastatin 40 mg daily with ongoing prescribing, monitoring and management via facility 4. Extra and intracranial stenosis:  Established with vascular surgery with follow-up appointment needed for ongoing monitoring and management as well as surveillance monitoring 5. Hx LE DVT: Chronic use of Eliquis currently managed by PCP   No follow-up visit scheduled at this time due to inappropriateness of routine visits if patient remains unaccompanied.  Advised SNF to call office if follow-up visit warranted and will be able to ensure patient is accompanied was able to assist with visit   I spent 45 minutes of face-to-face and non-face-to-face time with patient and review paperwork provided from SNF.  This included previsit chart review, lab review, study review, order entry, electronic health record documentation, patient education regarding recent stroke, residual deficits, importance of managing stroke risk factors and answered all questions to patient satisfaction    Frann Rider, [facility]  [facility] [address] [place], [place] [address]  Phone [phone] Fax [phone] Note: This document was prepared with digital dictation and possible smart phrase technology. Any transcriptional errors that result from this process are unintentional.

## 2019-07-24 NOTE — Patient Instructions (Signed)
Difficulty with visit due to communication barriers with dysarthria and cognitive impairment.  He was unaccompanied at today's visit.  Continues to have right hemiparesis, cognitive impairment and dysarthria per today's exam.  Recommend continuation of PT/OT/ST for residual stroke deficits  Continue aspirin 81 mg daily and Eliquis (apixaban) daily  and simvastatin 40 mg daily for secondary stroke prevention  Needs follow-up visit with vascular surgery as prior follow-up visit scheduled in 05/2019 while he was inpatient.  Currently being followed by vascular surgery for carotid artery stenosis monitoring and management  Maintain strict control of hypertension with blood pressure goal between 120-150 and cholesterol with LDL cholesterol (bad cholesterol) goal below 70 mg/dL. I also advised the patient to eat a healthy diet with plenty of whole grains, cereals, fruits and vegetables, exercise regularly and maintain ideal body weight.  We will hold off on scheduling follow-up visit at this time unless he is able to be accompanied at follow-up visit due to difficulty with visits while unaccompanied       Thank you for coming to see [place] at [facility]. I hope we have been able to provide you high quality care today.  You may receive a patient satisfaction survey over the next few weeks. We would appreciate your feedback and comments so that we may continue to improve ourselves and the health of our patients.

## 2019-07-25 NOTE — Progress Notes (Signed)
I agree with the above plan 

## 2019-08-04 DIAGNOSIS — R1312 Dysphagia, oropharyngeal phase: Secondary | ICD-10-CM | POA: Diagnosis not present

## 2019-08-04 DIAGNOSIS — I82511 Chronic embolism and thrombosis of right femoral vein: Secondary | ICD-10-CM | POA: Diagnosis not present

## 2019-08-04 DIAGNOSIS — I10 Essential (primary) hypertension: Secondary | ICD-10-CM | POA: Diagnosis not present

## 2019-08-04 DIAGNOSIS — I739 Peripheral vascular disease, unspecified: Secondary | ICD-10-CM | POA: Diagnosis not present

## 2019-08-04 DIAGNOSIS — J439 Emphysema, unspecified: Secondary | ICD-10-CM | POA: Diagnosis not present

## 2019-08-04 DIAGNOSIS — K219 Gastro-esophageal reflux disease without esophagitis: Secondary | ICD-10-CM | POA: Diagnosis not present

## 2019-08-04 DIAGNOSIS — I700 Atherosclerosis of aorta: Secondary | ICD-10-CM | POA: Diagnosis not present

## 2019-08-04 DIAGNOSIS — I6529 Occlusion and stenosis of unspecified carotid artery: Secondary | ICD-10-CM | POA: Diagnosis not present

## 2019-08-04 DIAGNOSIS — E440 Moderate protein-calorie malnutrition: Secondary | ICD-10-CM | POA: Diagnosis not present

## 2019-08-04 DIAGNOSIS — I69351 Hemiplegia and hemiparesis following cerebral infarction affecting right dominant side: Secondary | ICD-10-CM | POA: Diagnosis not present

## 2019-08-04 DIAGNOSIS — Z8701 Personal history of pneumonia (recurrent): Secondary | ICD-10-CM | POA: Diagnosis not present

## 2019-08-04 DIAGNOSIS — E785 Hyperlipidemia, unspecified: Secondary | ICD-10-CM | POA: Diagnosis not present

## 2019-08-04 DIAGNOSIS — I69391 Dysphagia following cerebral infarction: Secondary | ICD-10-CM | POA: Diagnosis not present

## 2019-08-04 DIAGNOSIS — N400 Enlarged prostate without lower urinary tract symptoms: Secondary | ICD-10-CM | POA: Diagnosis not present

## 2019-08-04 DIAGNOSIS — Z8616 Personal history of COVID-19: Secondary | ICD-10-CM | POA: Diagnosis not present

## 2019-08-04 DIAGNOSIS — Z950 Presence of cardiac pacemaker: Secondary | ICD-10-CM | POA: Diagnosis not present

## 2019-08-05 DIAGNOSIS — I69351 Hemiplegia and hemiparesis following cerebral infarction affecting right dominant side: Secondary | ICD-10-CM | POA: Diagnosis not present

## 2019-08-05 DIAGNOSIS — R1312 Dysphagia, oropharyngeal phase: Secondary | ICD-10-CM | POA: Diagnosis not present

## 2019-08-05 DIAGNOSIS — Z950 Presence of cardiac pacemaker: Secondary | ICD-10-CM | POA: Diagnosis not present

## 2019-08-05 DIAGNOSIS — I69391 Dysphagia following cerebral infarction: Secondary | ICD-10-CM | POA: Diagnosis not present

## 2019-08-05 DIAGNOSIS — I82511 Chronic embolism and thrombosis of right femoral vein: Secondary | ICD-10-CM | POA: Diagnosis not present

## 2019-08-05 DIAGNOSIS — I10 Essential (primary) hypertension: Secondary | ICD-10-CM | POA: Diagnosis not present

## 2019-08-07 DEATH — deceased

## 2019-08-19 ENCOUNTER — Telehealth: Payer: Self-pay

## 2019-08-19 NOTE — Telephone Encounter (Signed)
Patient wife called in to let us know that patient is deceased and I have canceled all his appointments, marked him E in [place] and took him out of [facility] and sent a return kit to his home

## 2019-09-17 ENCOUNTER — Telehealth: Payer: Self-pay

## 2019-09-17 NOTE — Telephone Encounter (Signed)
The insurance company would like for Dr. Lovena Le to send a statement on why the pt needed a pacemaker. If the pacemaker prevents a heart attack and/or stroke, what diagnosis was attach to the pacemaker being put in. The report number is [id number] and the fax number is [phone]. The insurance company is  [employer]. She needs this information to help get his bills paid. The pt wife phone number is [phone]. I told her I will have Dr. Lovena Le nurse Sonia Baller give her a call.

## 2019-09-17 NOTE — Telephone Encounter (Signed)
Call returned to Pt's wife.  Per wife [employer] is stating they will not pay for patient's pacemaker placed last November 2020 (?)  Advised wife would send clinical documentation as requested.

## 2021-07-17 IMAGING — CT CT ABDOMEN W/O CM
2 of 4 series · 14 of 46 positions shown, 16 images · non-contrast
Comparison: CT abdomen pelvis-06/03/2013; chest CT-03/11/2014

CLINICAL DATA: Dysphagia. Evaluate anatomy prior to potential
percutaneous gastrostomy tube placement.

EXAM:
CT ABDOMEN WITHOUT CONTRAST
TECHNIQUE: Multidetector CT imaging of the abdomen was performed following the
standard protocol without IV contrast.

[Series 3: ap without · axial · non-contrast · 0.81mm/px · z∈[-449,-209]mm · 11 of 58 slices shown, 13 images]
[im 5/58  soft-tissue]
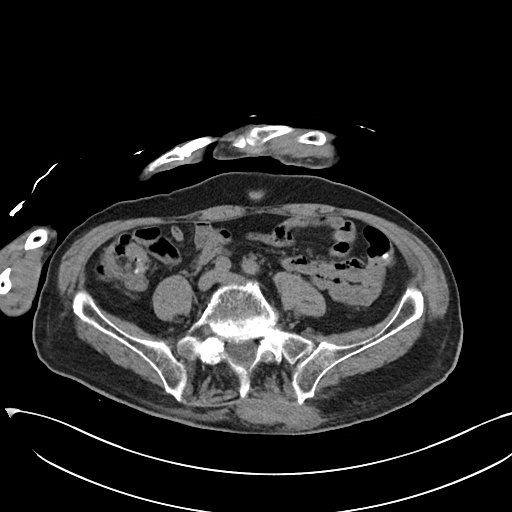
[im 5/58  bone]
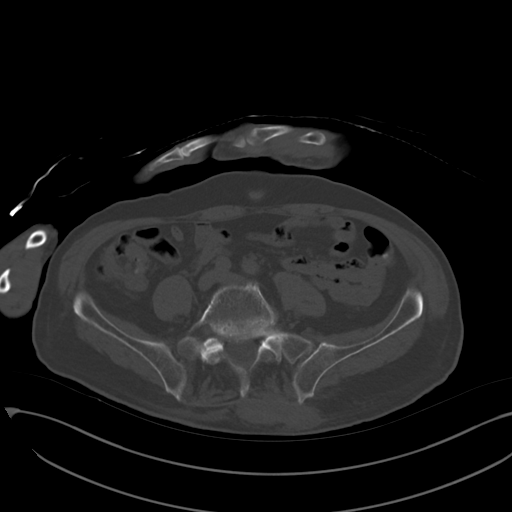
[im 9/58  soft-tissue]
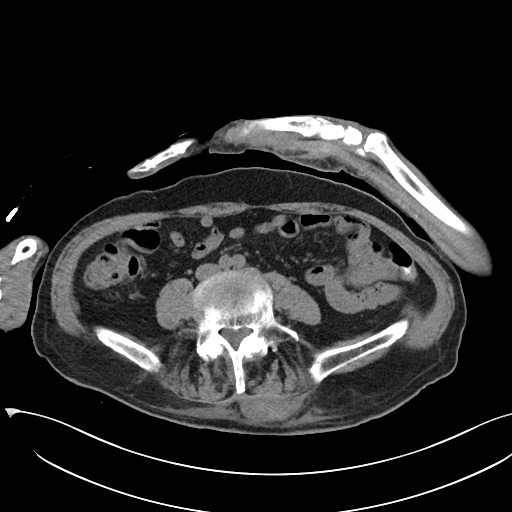
[im 13/58  soft-tissue]
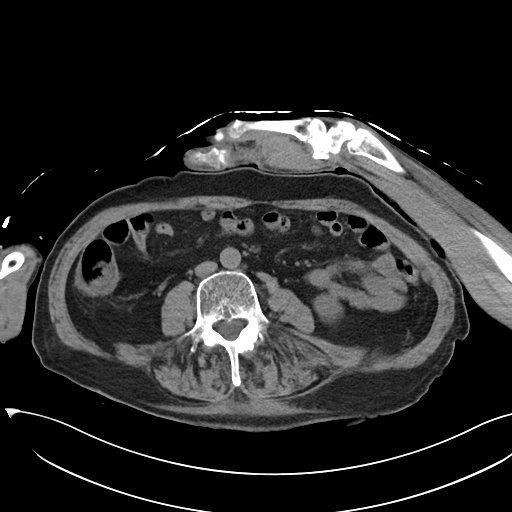
[im 21/58  soft-tissue]
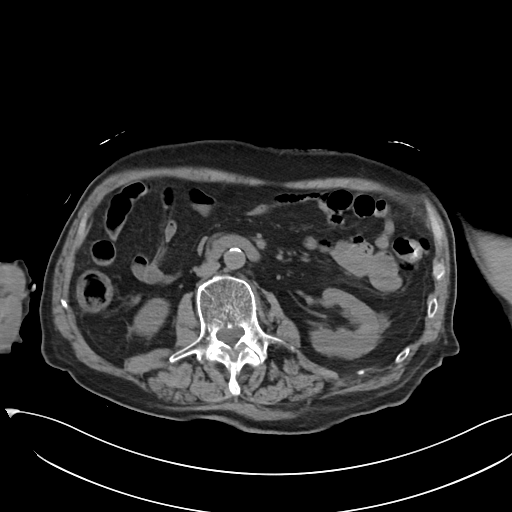
[im 25/58  soft-tissue]
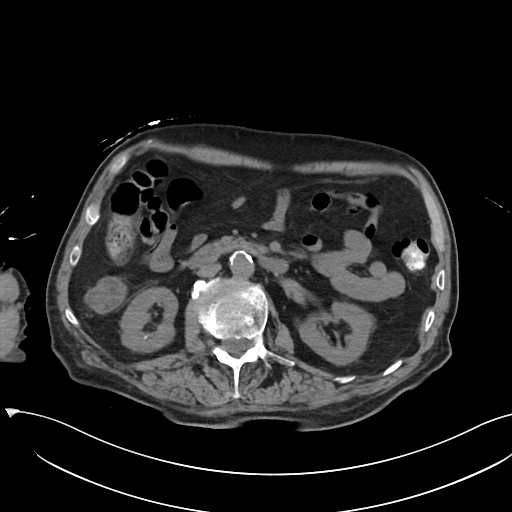
[im 29/58  soft-tissue]
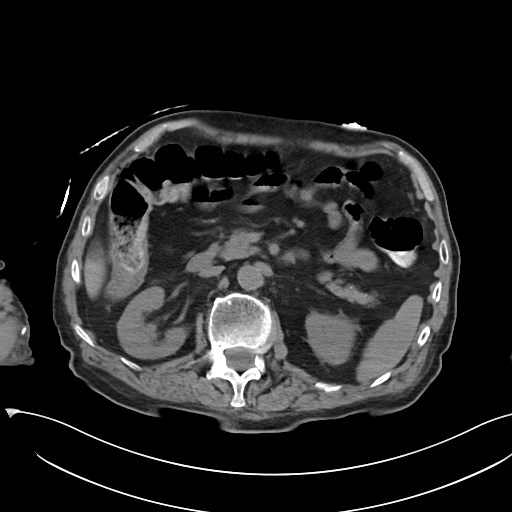
[im 33/58  soft-tissue]
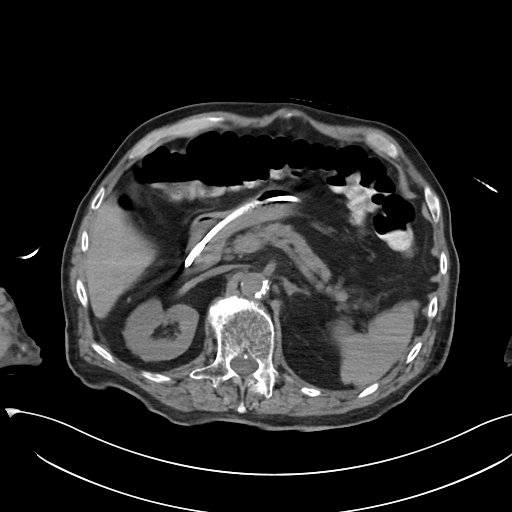
[im 37/58  soft-tissue]
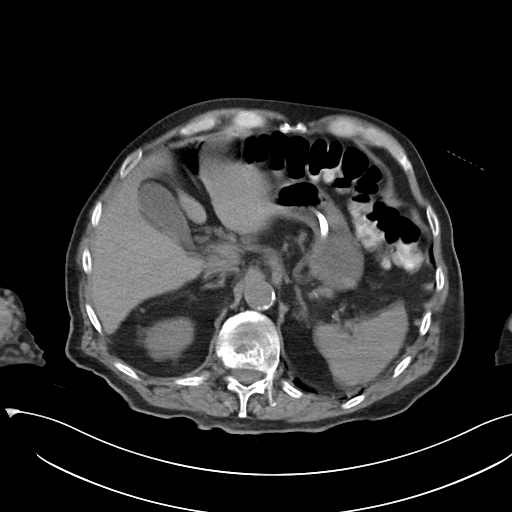
[im 45/58  soft-tissue]
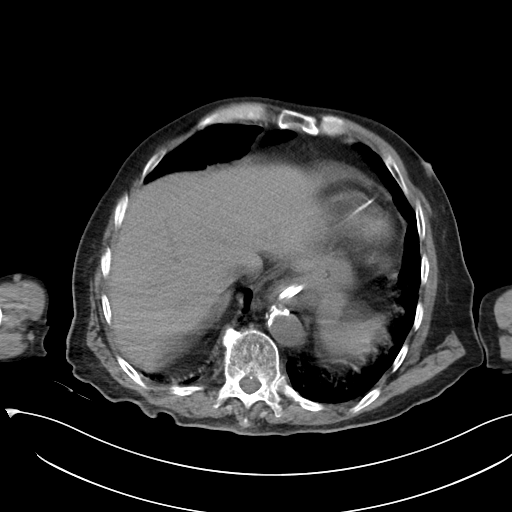
[im 45/58  bone]
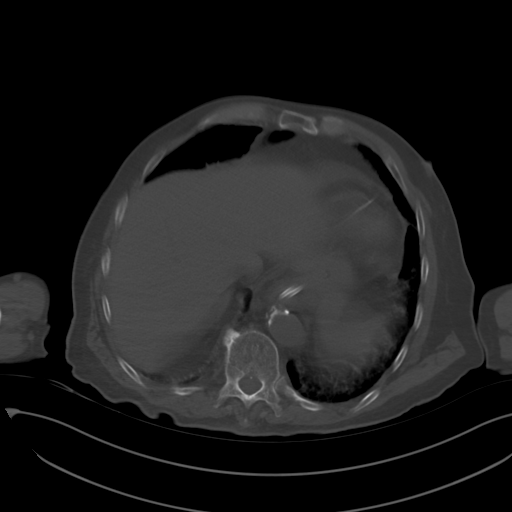
[im 49/58  soft-tissue]
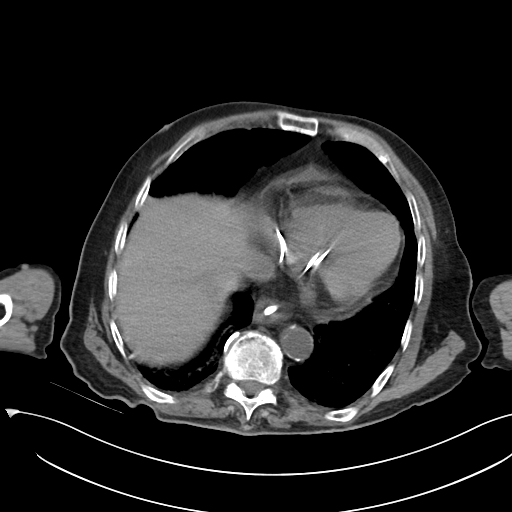
[im 53/58  soft-tissue]
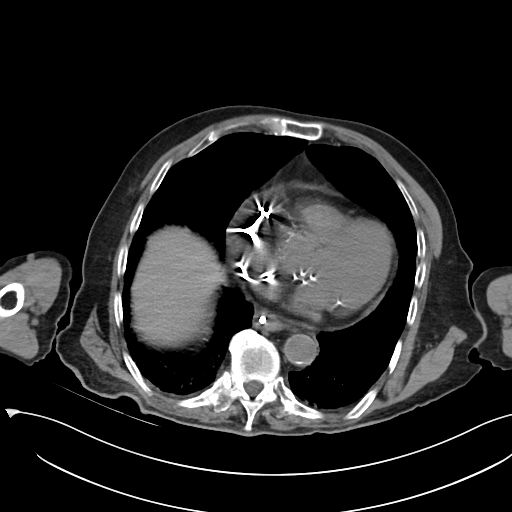

[Series 6: cor · coronal · 0.57mm/px · 3 of 98 slices shown]
[im 33/98  soft-tissue]
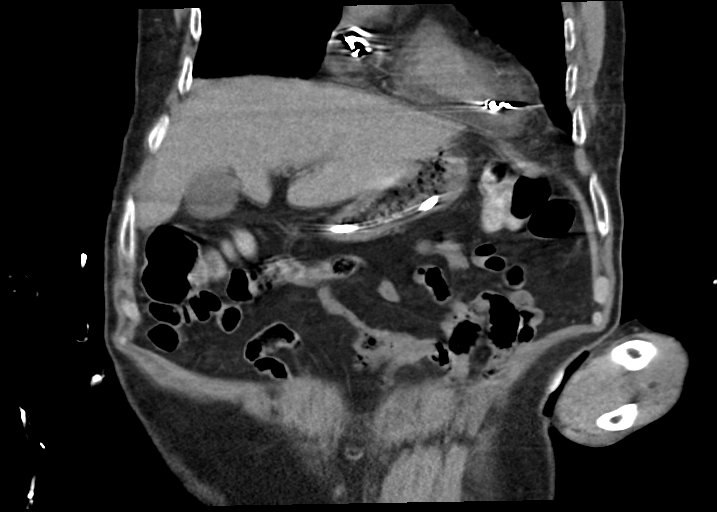
[im 44/98  soft-tissue]
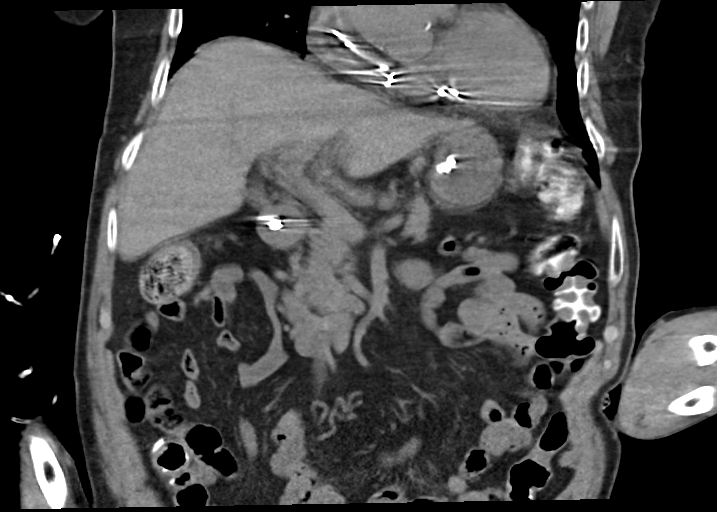
[im 54/98  soft-tissue]
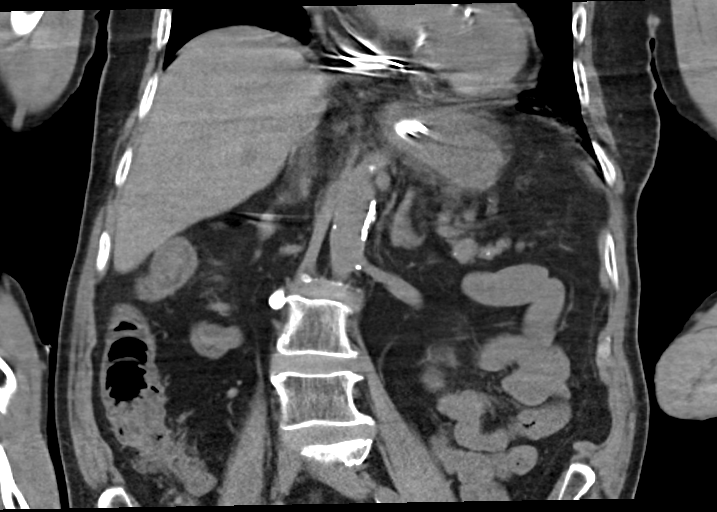

[14 of 46 positions shown; findings below may reference images not displayed]

FINDINGS: The lack of intravenous contrast limits the ability to evaluate
solid abdominal organs. Examination is further degraded secondary to
patient's overlying left upper extremity and patient respiratory
artifact.

Lower chest: Evaluation of lung bases is degraded secondary to
respiratory artifact. Minimal dependent subpleural ground-glass
atelectasis. No discrete focal airspace opacities. No pleural
effusion.

Cardiomegaly. Small pericardial effusion. Pacer leads terminate
within the right atrium and right ventricular apex. Coronary artery
calcifications.

Hepatobiliary: Normal hepatic contour. Note is made of a
approximately 0.9 cm hypoattenuating lesion within the caudal aspect
the right lobe of the liver (image 29, series 3), which is
incompletely characterized on the present examination though appears
morphologically similar to the [DATE] examination and thus favored
to represent a hepatic cyst. Normal noncontrast appearance of the
gallbladder given degree of distention. No radiopaque gallstones. No
ascites.

Pancreas: Normal noncontrast appearance of the pancreas.

Spleen: Normal noncontrast appearance of the spleen.

Adrenals/Urinary Tract: Punctate (approximately 0.8 cm) partially
exophytic hypoattenuating lesion arising from the superomedial
aspect the left kidney (image 33, series 3) is too small to
accurately characterize though morphologically similar to the 6335
examination and favored to represent a renal cyst. Note is made of 2
punctate (3-4 mm) right-sided renal stones (images 29 and 34, series
3). No discrete left-sided renal lesions. There is a very minimal
amount of grossly symmetric bilateral perinephric stranding, likely
age and body habitus related. No urinary obstruction.

Normal noncontrast appearance of the bilateral adrenal glands.

The urinary bladder was not imaged.

Stomach/Bowel: The transverse colon is interposed between the
anterior wall of the stomach and ventral abdominal wall.

Enteric tube tip terminates within the gastric antrum/duodenal bulb.
Enteric contrast is seen within the colon. Moderate colonic stool
burden without evidence of enteric obstruction. Normal noncontrast
appearance of the terminal ileum. The appendix is not visualized
compatible with provided operative history. No pneumoperitoneum,
pneumatosis or portal venous gas.

Vascular/Lymphatic: Scattered atherosclerotic plaque within normal
caliber abdominal aorta.

No bulky retroperitoneal or mesenteric lymphadenopathy on this
noncontrast examination.

Other: Subcutaneous edema about the midline of the low back with
suspected approximately 6.7 x 1.8 cm subcutaneous hematoma about the
left side of low back (image 53, series 3), without associated
fracture or radiopaque foreign body.

Musculoskeletal: Stigmata of dish within the thoracic spine.
Mild-to-moderate multilevel lumbar spine DDD, worse at L5-S1 with
disc space height loss, endplate irregularity and sclerosis.
IMPRESSION: 1. Gastric anatomy NOT amenable to attempted percutaneous
gastrostomy tube placement due to interposition of the transverse
colon. If gastrostomy tube placement is still desired, surgical
consultation is advised.
2. Subcutaneous edema about the midline of the low back with
suspected approximately 6.67 cm subcutaneous hematoma about the
left-sided low back without associated fracture or radiopaque
foreign body. Clinical correlation is advised.
3. Nonobstructing right-sided nephrolithiasis.
4. Coronary calcifications.  Aortic Atherosclerosis ([id number]).
